# Patient Record
Sex: Female | Born: 1980
Health system: Southern US, Community
[De-identification: ages and names within clinical notes are randomized; demographics above are authoritative.]

## PROBLEM LIST (undated history)

## (undated) DIAGNOSIS — I10 Essential (primary) hypertension: Secondary | ICD-10-CM

## (undated) DIAGNOSIS — E109 Type 1 diabetes mellitus without complications: Secondary | ICD-10-CM

## (undated) DIAGNOSIS — M5430 Sciatica, unspecified side: Secondary | ICD-10-CM

## (undated) DIAGNOSIS — Z87891 Personal history of nicotine dependence: Secondary | ICD-10-CM

## (undated) DIAGNOSIS — N19 Unspecified kidney failure: Secondary | ICD-10-CM

## (undated) DIAGNOSIS — M869 Osteomyelitis, unspecified: Secondary | ICD-10-CM

## (undated) DIAGNOSIS — F419 Anxiety disorder, unspecified: Secondary | ICD-10-CM

## (undated) DIAGNOSIS — K3184 Gastroparesis: Secondary | ICD-10-CM

## (undated) DIAGNOSIS — I219 Acute myocardial infarction, unspecified: Secondary | ICD-10-CM

## (undated) DIAGNOSIS — I70449 Atherosclerosis of autologous vein bypass graft(s) of the left leg with ulceration of unspecified site: Secondary | ICD-10-CM

## (undated) DIAGNOSIS — N181 Chronic kidney disease, stage 1: Secondary | ICD-10-CM

## (undated) DIAGNOSIS — E7800 Pure hypercholesterolemia, unspecified: Secondary | ICD-10-CM

## (undated) DIAGNOSIS — R0600 Dyspnea, unspecified: Secondary | ICD-10-CM

## (undated) DIAGNOSIS — I2510 Atherosclerotic heart disease of native coronary artery without angina pectoris: Secondary | ICD-10-CM

## (undated) DIAGNOSIS — M1990 Unspecified osteoarthritis, unspecified site: Secondary | ICD-10-CM

## (undated) DIAGNOSIS — I5022 Chronic systolic (congestive) heart failure: Secondary | ICD-10-CM

## (undated) DIAGNOSIS — K219 Gastro-esophageal reflux disease without esophagitis: Secondary | ICD-10-CM

## (undated) HISTORY — DX: Atherosclerotic heart disease of native coronary artery without angina pectoris: I25.10

## (undated) HISTORY — DX: Chronic systolic (congestive) heart failure: I50.22

## (undated) HISTORY — DX: Unspecified osteoarthritis, unspecified site: M19.90

## (undated) HISTORY — PX: TUBAL LIGATION: SHX77

## (undated) HISTORY — PX: APPENDECTOMY: SHX54

## (undated) HISTORY — DX: Anxiety disorder, unspecified: F41.9

## (undated) HISTORY — DX: Unspecified kidney failure: N19

## (undated) NOTE — *Deleted (*Deleted)
[facility] - Clinic Note  10/14/2020     CHIEF COMPLAINT Patient presents for No chief complaint on file.   HISTORY OF PRESENT ILLNESS: Anne [name] is a 32 y.o. female who presents to the clinic today for:   pt states she was hospitalized in April for gastroparesis, she states she lost a bunch of weight at that time, she states she has had all her medications put into blister packs so that she makes sure she takes all of them, she says she was inadvertently missing some doses bc of her eye sight, she states she is feeling much better physically and has gained about 15lbs  Referring physician: Tanna Furry, MD [address] [place],  [place] [address]  HISTORICAL INFORMATION:   Selected notes from the MEDICAL RECORD NUMBER Referred by Dr. Georgiann Cocker for concern of VH / TRD OS   CURRENT MEDICATIONS: Current Outpatient Medications (Ophthalmic Drugs)  Medication Sig  . brimonidine (ALPHAGAN) 0.2 % ophthalmic solution Place 1 drop into the right eye 2 (two) times daily.  . dorzolamide-timolol (COSOPT) 22.3-6.8 MG/ML ophthalmic solution Place 1 drop into the right eye 2 (two) times daily.   No current facility-administered medications for this visit. (Ophthalmic Drugs)   Current Outpatient Medications (Other)  Medication Sig  . albuterol (VENTOLIN HFA) 108 (90 Base) MCG/ACT inhaler Inhale 2 puffs into the lungs every 6 (six) hours as needed for wheezing or shortness of breath.  Marland Kitchen amoxicillin (AMOXIL) 500 MG capsule Take 500 mg by mouth 3 (three) times daily.  Marland Kitchen aspirin (GNP ASPIRIN LOW DOSE) 81 MG EC tablet Take 1 tablet (81 mg total) by mouth daily with breakfast. Swallow whole.  Marland Kitchen atorvastatin (LIPITOR) 80 MG tablet Take 1 tablet (80 mg total) by mouth every evening.  Marland Kitchen BAQSIMI TWO PACK 3 MG/DOSE POWD Place 3 mg into the nose once as needed (for emergency low blood sugar levels).   Marland Kitchen BYDUREON BCISE 2 MG/0.85ML AUIJ Inject 2 mg into the skin  every Thursday.  . carvedilol (COREG) 6.25 MG tablet Take 1.5 tablets (9.375 mg total) by mouth 2 (two) times daily.  Marland Kitchen ezetimibe (ZETIA) 10 MG tablet Take 1 tablet (10 mg total) by mouth daily.  . fenofibrate (TRICOR) 145 MG tablet Take 1 tablet (145 mg total) by mouth daily.  . furosemide (LASIX) 40 MG tablet Take 1 tablet (40 mg total) by mouth 2 (two) times daily.  . isosorbide mononitrate (IMDUR) 30 MG 24 hr tablet TAKE 1 TABLET BY MOUTH ONCE DAILY.  Marland Kitchen LANTUS SOLOSTAR 100 UNIT/ML Solostar Pen INNJECT 50 UNITS S.Q. ONCE DAILY AT 10 P.M. (Patient taking differently: Inject 50 Units into the skin at bedtime. )  . losartan (COZAAR) 25 MG tablet Take 1 tablet (25 mg total) by mouth every evening.  . metFORMIN (GLUCOPHAGE) 1000 MG tablet Take 1,000 mg by mouth 2 (two) times daily with a meal.  . metoCLOPramide (REGLAN) 5 MG tablet Take 1 tablet (5 mg total) by mouth 3 (three) times daily before meals.  . nitroGLYCERIN (NITROSTAT) 0.4 MG SL tablet Place 1 tablet (0.4 mg total) under the tongue every 5 (five) minutes as needed.  Marland Kitchen NOVOLOG FLEXPEN 100 UNIT/ML FlexPen INJECT 12-18 UNITS S.Q. THREE TIMES DAILY WITH MEALS. (Patient taking differently: Inject 12-18 Units into the skin 3 (three) times daily with meals. )  . ondansetron (ZOFRAN ODT) 8 MG disintegrating tablet Take 0.5 tablets (4 mg total) by mouth in the morning, at  noon, and at bedtime.  . pantoprazole (PROTONIX) 40 MG tablet Take 1 tablet (40 mg total) by mouth daily.  . potassium chloride (K-DUR) 10 MEQ tablet Take 1 tablet (10 mEq total) by mouth 2 (two) times daily.  . pregabalin (LYRICA) 75 MG capsule   . promethazine (PHENERGAN) 12.5 MG suppository Place 1 suppository (12.5 mg total) rectally as needed for refractory nausea / vomiting.  . sertraline (ZOLOFT) 100 MG tablet Take 200 mg by mouth at bedtime.   Marland Kitchen spironolactone (ALDACTONE) 25 MG tablet Take 0.5 tablets (12.5 mg total) by mouth daily.  Marland Kitchen sulfamethoxazole-trimethoprim  (BACTRIM DS) 800-160 MG tablet Take 1 tablet by mouth 2 (two) times daily.  . ticagrelor ([name]) 90 MG TABS tablet Take 1 tablet (90 mg total) by mouth 2 (two) times daily.  . vitamin B-12 (CYANOCOBALAMIN) 1000 MCG tablet Take 1,000 mcg by mouth daily.  . Vitamin D, Ergocalciferol, (DRISDOL) 1.25 MG (50000 UNIT) CAPS capsule Take by mouth.   No current facility-administered medications for this visit. (Other)      REVIEW OF SYSTEMS:    ALLERGIES Allergies  Allergen Reactions  . Canagliflozin Other (See Comments)    Vaginal yeast infections  . Nsaids Other (See Comments)    Yeast infection     PAST MEDICAL HISTORY Past Medical History:  Diagnosis Date  . Acid reflux   . Anxiety   . Arthritis   . 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 CAD (coronary artery disease) 11/13/2018   Late presentation anterior MI 12/19 >> LHC - dLM 25, mLAD 99, oOM2 100 (R-L collats), irreg RCA, EF 25-35 >> PCI: POBA to mLAD  . Chronic systolic CHF (congestive heart failure) (HCC) 11/28/2018   Ischemic CM // late presentation ant MI 10/2018 tx with POBA to LAD (residual CAD with CTO of the OM2) // Echo 12/19:  No mural apical thrombus, septal, apical mid ant and inf HK; mild LVH, EF 30-35, mild MR // Echo 01/2019: EF 30-35, Gr 1 DD, diff HK, apical AK, mild MR   . CKD (chronic kidney disease), stage I   . Diabetes mellitus type 1 (HCC)   . Dyspnea   . Former tobacco use   . Gastroparesis   . Hypercholesteremia   . Hypertension   . Myocardial infarction (HCC) 2019  . 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  . Renal failure   . Sciatica    Past Surgical History:  Procedure Laterality Date  . AMPUTATION Right 11/03/2011   Procedure: AMPUTATION RAY;  Surgeon: Dalia Heading;  Location: [facility];  Service: General;  Laterality: Right;  Right fourth and fifth metatarsal   . APPENDECTOMY    . CARDIAC CATHETERIZATION  10/2018  . CESAREAN SECTION  2004 and  2007   x2  . CORONARY/GRAFT ACUTE MI REVASCULARIZATION N/A 11/11/2018   Procedure: CORONARY/GRAFT ACUTE MI REVASCULARIZATION;  Surgeon: Corky Crafts, MD;  Location: [facility] INVASIVE CV LAB;  Service: Cardiovascular;  Laterality: N/A;  . FRACTURE SURGERY  2000  . INJECTION OF SILICONE OIL Right 09/14/2019   Procedure: Injection Of Silicone Oil;  Surgeon: Rennis Chris, MD;  Location: [facility] OR;  Service: Ophthalmology;  Laterality: Right;  . LEFT HEART CATH AND CORONARY ANGIOGRAPHY N/A 11/11/2018   Procedure: LEFT HEART CATH AND CORONARY ANGIOGRAPHY;  Surgeon: Corky Crafts, MD;  Location: [facility] INVASIVE CV LAB;  Service: Cardiovascular;  Laterality: N/A;  . MEMBRANE PEEL  Right 09/14/2019   Procedure: Eula Flax;  Surgeon: Rennis Chris, MD;  Location: [facility] OR;  Service: Ophthalmology;  Laterality: Right;  . PARS PLANA VITRECTOMY Right 09/14/2019   Procedure: [place] Vitrectomy With 25 Gauge;  Surgeon: Rennis Chris, MD;  Location: [facility] OR;  Service: Ophthalmology;  Laterality: Right;  . PHOTOCOAGULATION WITH LASER Right 09/14/2019   Procedure: Photocoagulation With Laser;  Surgeon: Rennis Chris, MD;  Location: [facility] OR;  Service: Ophthalmology;  Laterality: Right;  . 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   Procedure: REPAIR OF COMPLEX TRACTION RETINAL DETACHMENT;  Surgeon: Rennis Chris, MD;  Location: [facility] OR;  Service: Ophthalmology;  Laterality: Right;  . TUBAL LIGATION      FAMILY HISTORY Family History  Problem Relation Age of Onset  . Diabetes Father   . Lung cancer Father   . Alcoholism Father   . Asthma Mother   . Kidney disease Mother   . Anemia Mother        hemolytic  . Hypertension Mother   . Heart attack Paternal Grandmother   . Diabetes Paternal Grandmother   . Diabetes Paternal Grandfather   . Anesthesia problems Neg Hx   . Hypotension Neg Hx   . Malignant hyperthermia Neg Hx   . Pseudochol deficiency Neg Hx     SOCIAL HISTORY Social  History   Tobacco Use  . Smoking status: Former Smoker    Packs/day: 0.25    Years: 20.00    Pack years: 5.00    Types: Cigarettes    Quit date: 12/29/2013    Years since quitting: 6.7  . Smokeless tobacco: Never Used  Vaping Use  . Vaping Use: Never used  Substance Use Topics  . Alcohol use: No  . Drug use: No         OPHTHALMIC EXAM:  Not recorded     IMAGING AND PROCEDURES  Imaging and Procedures for @TODAY @           ASSESSMENT/PLAN:    ICD-10-CM   1. 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 Retinal edema  H35.81   3. Essential hypertension  I10   4. Hypertensive retinopathy of both eyes  H35.033   5. Combined forms of age-related cataract of both eyes  H25.813     1,2. 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)  - lost to f/u from 3.19.21 to 8.20.21 (5 mos) -- was been hospitalized due to gastroparesis  - came back due to running out of drops  - delayed follow up from 4 weeks to 11 weeks (from 12.11.20 to 3.2.21)  - formerly managed at [facility] -- s/p PRP OU -- last visit in 2017  - extensive history of severe disease and medical noncompliance  - exam showed and OCT confirmed TRD OU -- OD with inf macula and foveal involvement; OS with closed wolf-jaw total TRD with macular hole  - discussed findings and very poor prognosis given chronicity of problems  - S/P IVA #1 OD (10.16.20)  - s/p PPV/MP/EL/FAX/1000cs SO OD, 10.22.20  - BCVA 20/80 OD -- improved from 20/150             - fibrosis improved and retina flattening under oil  - OCT shows interval improvement in shallow SRF inferiorly -- improving slowly             - IOP  okay today at 20, was elevated at 56 on 03.02.21 -- ran out of drops about 1 wk ago   - restart Cosopt BID OD -- refills sent  - restart Brimonidine BID OD -- refills sent  -  f/u 3-4 months, sooner prn -- DFE, OCT   3,4. Hypertensive retinopathy OU  - discussed importance of tight BP control  - monitor  5. 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  - discussed diagnosis and progression  - OD w/ progressive PSC -- likely limiting vision  - will refer to [facility] for cat eval   Ophthalmic Meds Ordered this visit:  No orders of the defined types were placed in this encounter.      No follow-ups on file.  There are no Patient Instructions on file for this visit.  This document serves as a record of services personally performed by Karie Chimera, MD, PhD. It was created on their behalf by Herby Abraham, COA, an ophthalmic technician. The creation of this record is the provider's dictation and/or activities during the visit.    Electronically signed by: Herby Abraham, COA @TODAY @ 10:22 AM  Abbreviations: M myopia (nearsighted); A astigmatism; H hyperopia (farsighted); P presbyopia; Mrx spectacle prescription;  CTL contact lenses; OD right eye; OS left eye; OU both eyes  XT exotropia; ET esotropia; PEK punctate epithelial keratitis; PEE punctate epithelial erosions; DES dry eye syndrome; MGD meibomian gland dysfunction; ATs artificial tears; PFAT's preservative free artificial tears; NSC nuclear sclerotic cataract; PSC posterior subcapsular cataract; ERM epi-retinal membrane; PVD posterior vitreous detachment; RD retinal detachment; DM diabetes mellitus; DR diabetic retinopathy; NPDR non-proliferative diabetic retinopathy; PDR proliferative diabetic retinopathy; CSME clinically significant macular edema; DME diabetic macular edema; dbh dot blot hemorrhages; CWS cotton wool spot; POAG primary open angle glaucoma; C/D cup-to-disc ratio; HVF humphrey visual field; GVF goldmann visual field; OCT optical coherence tomography; IOP intraocular pressure; BRVO Branch  retinal vein occlusion; CRVO central retinal vein occlusion; CRAO central retinal artery occlusion; BRAO branch retinal artery occlusion; RT retinal tear; SB scleral buckle; PPV pars plana vitrectomy; VH Vitreous hemorrhage; PRP panretinal laser photocoagulation; IVK intravitreal kenalog; VMT vitreomacular traction; MH Macular hole;  NVD neovascularization of the disc; NVE neovascularization elsewhere; AREDS age related eye disease study; ARMD age related macular degeneration; POAG primary open angle glaucoma; EBMD epithelial/anterior basement membrane dystrophy; ACIOL anterior chamber intraocular lens; IOL intraocular lens; PCIOL posterior chamber intraocular lens; Phaco/IOL phacoemulsification with intraocular lens placement; PRK photorefractive keratectomy; LASIK laser assisted in situ keratomileusis; HTN hypertension; DM diabetes mellitus; COPD chronic obstructive pulmonary disease

---
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
## 2005-05-01 ENCOUNTER — Ambulatory Visit (HOSPITAL_COMMUNITY): Admission: RE | Admit: 2005-05-01 | Discharge: 2005-05-01 | Payer: Self-pay | Admitting: Family Medicine

## 2005-06-03 ENCOUNTER — Encounter: Payer: Self-pay | Admitting: Family Medicine

## 2005-06-10 ENCOUNTER — Encounter (HOSPITAL_COMMUNITY): Admission: RE | Admit: 2005-06-10 | Discharge: 2005-07-10 | Payer: Self-pay | Admitting: Family Medicine

## 2005-06-23 ENCOUNTER — Encounter: Payer: Self-pay | Admitting: Family Medicine

## 2005-06-25 ENCOUNTER — Emergency Department (HOSPITAL_COMMUNITY): Admission: EM | Admit: 2005-06-25 | Discharge: 2005-06-25 | Payer: Self-pay | Admitting: Emergency Medicine

## 2005-07-16 ENCOUNTER — Emergency Department (HOSPITAL_COMMUNITY): Admission: EM | Admit: 2005-07-16 | Discharge: 2005-07-16 | Payer: Self-pay | Admitting: Emergency Medicine

## 2005-07-20 ENCOUNTER — Encounter (HOSPITAL_COMMUNITY): Admission: RE | Admit: 2005-07-20 | Discharge: 2005-08-19 | Payer: Self-pay | Admitting: Family Medicine

## 2009-08-26 ENCOUNTER — Emergency Department (HOSPITAL_COMMUNITY): Admission: EM | Admit: 2009-08-26 | Discharge: 2009-08-26 | Payer: Self-pay | Admitting: Emergency Medicine

## 2010-12-14 ENCOUNTER — Encounter: Payer: Self-pay | Admitting: Family Medicine

## 2011-02-26 LAB — URINE MICROSCOPIC-ADD ON

## 2011-02-26 LAB — URINALYSIS, ROUTINE W REFLEX MICROSCOPIC
Bilirubin Urine: NEGATIVE
pH: 6 (ref 5.0–8.0)

## 2011-02-26 LAB — CBC
Hemoglobin: 13.4 g/dL (ref 12.0–15.0)
MCHC: 35.3 g/dL (ref 30.0–36.0)
RBC: 4.25 MIL/uL (ref 3.87–5.11)
WBC: 9.3 10*3/uL (ref 4.0–10.5)

## 2011-02-26 LAB — DIFFERENTIAL
Basophils Relative: 0 % (ref 0–1)
Lymphs Abs: 1.5 10*3/uL (ref 0.7–4.0)
Monocytes Absolute: 0.6 10*3/uL (ref 0.1–1.0)
Monocytes Relative: 6 % (ref 3–12)
Neutro Abs: 7.1 10*3/uL (ref 1.7–7.7)

## 2011-02-26 LAB — BASIC METABOLIC PANEL
CO2: 28 mEq/L (ref 19–32)
Calcium: 9.2 mg/dL (ref 8.4–10.5)
Chloride: 98 mEq/L (ref 96–112)
GFR calc Af Amer: >60 mL/min (ref >60)
Sodium: 133 mEq/L — ABNORMAL LOW (ref 135–145)

## 2011-02-26 LAB — PREGNANCY, URINE: Preg Test, Ur: NEGATIVE

## 2011-10-27 ENCOUNTER — Emergency Department (HOSPITAL_COMMUNITY)
Admission: EM | Admit: 2011-10-27 | Discharge: 2011-10-28 | Disposition: A | Discharge status: Home / Self Care | Payer: Medicaid Other | Attending: Emergency Medicine | Admitting: Emergency Medicine

## 2011-10-27 ENCOUNTER — Encounter: Payer: Self-pay | Admitting: *Deleted

## 2011-10-27 ENCOUNTER — Emergency Department (HOSPITAL_COMMUNITY): Payer: Medicaid Other

## 2011-10-27 DIAGNOSIS — I10 Essential (primary) hypertension: Secondary | ICD-10-CM | POA: Insufficient documentation

## 2011-10-27 DIAGNOSIS — L03119 Cellulitis of unspecified part of limb: Secondary | ICD-10-CM | POA: Insufficient documentation

## 2011-10-27 DIAGNOSIS — F17200 Nicotine dependence, unspecified, uncomplicated: Secondary | ICD-10-CM | POA: Insufficient documentation

## 2011-10-27 DIAGNOSIS — K219 Gastro-esophageal reflux disease without esophagitis: Secondary | ICD-10-CM | POA: Insufficient documentation

## 2011-10-27 DIAGNOSIS — L97509 Non-pressure chronic ulcer of other part of unspecified foot with unspecified severity: Secondary | ICD-10-CM | POA: Insufficient documentation

## 2011-10-27 DIAGNOSIS — E1169 Type 2 diabetes mellitus with other specified complication: Secondary | ICD-10-CM | POA: Insufficient documentation

## 2011-10-27 DIAGNOSIS — Z794 Long term (current) use of insulin: Secondary | ICD-10-CM | POA: Insufficient documentation

## 2011-10-27 DIAGNOSIS — L0390 Cellulitis, unspecified: Secondary | ICD-10-CM

## 2011-10-27 DIAGNOSIS — L02619 Cutaneous abscess of unspecified foot: Secondary | ICD-10-CM | POA: Insufficient documentation

## 2011-10-27 DIAGNOSIS — E11621 Type 2 diabetes mellitus with foot ulcer: Secondary | ICD-10-CM

## 2011-10-27 HISTORY — DX: Essential (primary) hypertension: I10

## 2011-10-27 HISTORY — DX: Gastro-esophageal reflux disease without esophagitis: K21.9

## 2011-10-27 LAB — DIFFERENTIAL
Basophils Relative: 0 % (ref 0–1)
Eosinophils Absolute: 0.1 10*3/uL (ref 0.0–0.7)
Eosinophils Relative: 1 % (ref 0–5)
Lymphs Abs: 2.6 10*3/uL (ref 0.7–4.0)
Monocytes Absolute: 0.8 10*3/uL (ref 0.1–1.0)
Monocytes Relative: 5 % (ref 3–12)
Neutrophils Relative %: 77 % (ref 43–77)

## 2011-10-27 LAB — CBC
HCT: 36.1 % (ref 36.0–46.0)
Hemoglobin: 12.8 g/dL (ref 12.0–15.0)
MCH: 31 pg (ref 26.0–34.0)
MCHC: 35.5 g/dL (ref 30.0–36.0)
MCV: 87.4 fL (ref 78.0–100.0)

## 2011-10-27 LAB — BASIC METABOLIC PANEL
BUN: 12 mg/dL (ref 6–23)
Calcium: 10 mg/dL (ref 8.4–10.5)
Creatinine, Ser: 0.59 mg/dL (ref 0.50–1.10)
GFR calc Af Amer: >90 mL/min (ref >90)
GFR calc non Af Amer: >90 mL/min (ref >90)
Glucose, Bld: 142 mg/dL — ABNORMAL HIGH (ref 70–99)

## 2011-10-27 MED ORDER — MORPHINE SULFATE 4 MG/ML IJ SOLN
4.0000 mg | Freq: Once | INTRAMUSCULAR | Status: AC
  Administered 2011-10-27: 4 mg via INTRAVENOUS
  Filled 2011-10-27: qty 1

## 2011-10-27 MED ORDER — SODIUM CHLORIDE 0.9 % IV SOLN
3.0000 g | Freq: Once | INTRAVENOUS | Status: AC
  Administered 2011-10-27: 3 g via INTRAVENOUS
  Filled 2011-10-27: qty 3

## 2011-10-27 NOTE — ED Provider Notes (Signed)
History     CSN: [id number] Arrival date & time: 10/27/2011  9:09 PM   First MD Initiated Contact with Patient 10/27/11 2300      Chief Complaint  Patient presents with  . Foot Pain    (Consider location/radiation/quality/duration/timing/severity/associated sxs/prior treatment) HPI Comments: Patient with history of DM.  Has had foot cellulitis, being treated with clinda.  Has gotten worse.  Was sent here for iv antibiotics.    Patient is a 30 y.o. female presenting with lower extremity pain. The history is provided by the patient.  Foot Pain The current episode started more than 1 week ago. The problem occurs constantly. The problem has been gradually worsening. The symptoms are aggravated by walking. The symptoms are relieved by nothing.    Past Medical History  Diagnosis Date  . Diabetes mellitus   . Hypertension   . Acid reflux     Past Surgical History  Procedure Date  . Appendectomy   . Cesarean section     No family history on file.  History  Substance Use Topics  . Smoking status: Current Some Day Smoker  . Smokeless tobacco: Not on file  . Alcohol Use: No    OB History    Grav Para Term Preterm Abortions TAB SAB Ect Mult Living                  Review of Systems  All other systems reviewed and are negative.    Allergies  Review of patient's allergies indicates no known allergies.  Home Medications   Current Outpatient Rx  Name Route Sig Dispense Refill  . ACETAMINOPHEN 500 MG PO TABS Oral Take 1,000 mg by mouth daily as needed. For pain     . CLINDAMYCIN HCL 300 MG PO CAPS Oral Take 600 mg by mouth 3 (three) times daily.      . INSULIN ASPART 100 UNIT/ML [place] SOLN Subcutaneous Inject 6-8 Units into the skin 2 (two) times daily. Take 6 units with lunch and 8 units with dinner     . INSULIN GLARGINE 100 UNIT/ML [place] SOLN Subcutaneous Inject 31 Units into the skin at bedtime.      Marland Kitchen LISINOPRIL 5 MG PO TABS Oral Take 5 mg by mouth daily.      Marland Kitchen  METFORMIN HCL 1000 MG PO TABS Oral Take 1,000 mg by mouth 2 (two) times daily.      Marland Kitchen NAPROXEN 500 MG PO TABS Oral Take 500 mg by mouth 2 (two) times daily.      Marland Kitchen PRAVASTATIN SODIUM 20 MG PO TABS Oral Take 20 mg by mouth daily.        BP 117/70  Pulse 107  Temp(Src) 98 F (36.7 C) (Oral)  Resp 16  Ht 5\' 10"  (1.778 m)  Wt 198 lb (89.812 kg)  BMI 28.41 kg/m2  SpO2 100%  LMP 10/03/2011  Physical Exam  Constitutional: She is oriented to person, place, and time. She appears well-developed and well-nourished. No distress.  HENT:  Head: Normocephalic and atraumatic.  Neck: Normal range of motion. Neck supple.  Cardiovascular: Normal rate and regular rhythm.  Exam reveals friction rub. Exam reveals no gallop.   No murmur heard. Pulmonary/Chest: Effort normal and breath sounds normal. No respiratory distress. She has no wheezes.  Abdominal: Soft. Bowel sounds are normal. She exhibits no distension. There is no tenderness.  Musculoskeletal:       The right foot has an ulcer on the lateral bottom aspect in the  area of the distal 5th metatarsal.  There is warmth and erythema surrounding.    Neurological: She is alert and oriented to person, place, and time.  Skin: Skin is warm and dry. She is not diaphoretic.    ED Course  Procedures (including critical care time)  Labs Reviewed  CBC - Abnormal; Notable for the following:    WBC 14.8 (*)    All other components within normal limits  DIFFERENTIAL - Abnormal; Notable for the following:    Neutro Abs 11.3 (*)    All other components within normal limits  BASIC METABOLIC PANEL - Abnormal; Notable for the following:    Glucose, Bld 142 (*)    All other components within normal limits   Dg Foot Complete Right  10/27/2011  *RADIOLOGY REPORT*  Clinical Data: Diabetic with pain, swelling and erythema involving the fourth and fifth metatarsals.  No known injury.  RIGHT FOOT COMPLETE - 3+ VIEW  Comparison: None.  Findings: There is a  nondisplaced fracture involving the medial base of the second proximal phalanx.  No displaced fracture, dislocation or bone destruction is identified.  Diffuse forefoot soft tissue swelling is present.  There is no evidence of soft tissue emphysema.  IMPRESSION:  1.  Nondisplaced intra-articular fracture involving the base of the right second proximal phalanx. 2.  Forefoot soft tissue swelling.  No radiographic evidence of osteomyelitis.  Original Report Authenticated By: Gerrianne Scale, M.D.     No diagnosis found.    MDM  Will treat with augmentin, pain meds.  Patient was offered admission but does not want this at this point.  Will return if worsens.        Geoffery Lyons, MD 10/28/11 [id number]

## 2011-10-27 NOTE — ED Notes (Signed)
Pt reports she has been on abt meds for a diabetic ulcer on rt foot, was seen by PCP today and advised to be eval in ED for possible iv abt

## 2011-10-27 NOTE — ED Notes (Addendum)
Pt reports being on p.o abx for >2 weeks for infected area on right foot.  States that she saw her PCP today and was instructed to come to ED in order to be evaluated and admitted for IV antibiotics.  Right side of foot red and swollen.  Blanched area on bottom of foot, surrounding small laceration. No discharge noted from wound. Pt requesting pain medication.

## 2011-10-28 MED ORDER — AMOXICILLIN-POT CLAVULANATE 500-125 MG PO TABS: 1.0000 | ORAL_TABLET | Freq: Three times a day (TID) | ORAL | Status: DC

## 2011-10-28 MED ORDER — OXYCODONE-ACETAMINOPHEN 5-325 MG PO TABS: 2.0000 | ORAL_TABLET | ORAL | Status: DC | PRN

## 2011-10-28 NOTE — ED Notes (Signed)
Wrapped right foot with gauze wrap as requested by patient.

## 2011-10-30 ENCOUNTER — Encounter (HOSPITAL_COMMUNITY): Payer: Self-pay | Admitting: *Deleted

## 2011-10-30 ENCOUNTER — Emergency Department (HOSPITAL_COMMUNITY): Payer: Medicaid Other

## 2011-10-30 ENCOUNTER — Inpatient Hospital Stay (HOSPITAL_COMMUNITY)
Admission: EM | Admit: 2011-10-30 | Discharge: 2011-11-05 | DRG: 240 | Disposition: A | Discharge status: Home Health | Payer: Medicaid Other | Attending: Internal Medicine | Admitting: Internal Medicine

## 2011-10-30 DIAGNOSIS — L03119 Cellulitis of unspecified part of limb: Secondary | ICD-10-CM | POA: Diagnosis present

## 2011-10-30 DIAGNOSIS — K219 Gastro-esophageal reflux disease without esophagitis: Secondary | ICD-10-CM | POA: Diagnosis present

## 2011-10-30 DIAGNOSIS — I96 Gangrene, not elsewhere classified: Secondary | ICD-10-CM

## 2011-10-30 DIAGNOSIS — L03115 Cellulitis of right lower limb: Secondary | ICD-10-CM

## 2011-10-30 DIAGNOSIS — L02619 Cutaneous abscess of unspecified foot: Secondary | ICD-10-CM | POA: Diagnosis present

## 2011-10-30 DIAGNOSIS — Z794 Long term (current) use of insulin: Secondary | ICD-10-CM

## 2011-10-30 DIAGNOSIS — E11628 Type 2 diabetes mellitus with other skin complications: Secondary | ICD-10-CM

## 2011-10-30 DIAGNOSIS — E1059 Type 1 diabetes mellitus with other circulatory complications: Principal | ICD-10-CM | POA: Diagnosis present

## 2011-10-30 DIAGNOSIS — I10 Essential (primary) hypertension: Secondary | ICD-10-CM | POA: Diagnosis present

## 2011-10-30 HISTORY — DX: Type 1 diabetes mellitus without complications: E10.9

## 2011-10-30 LAB — BASIC METABOLIC PANEL
Calcium: 10.1 mg/dL (ref 8.4–10.5)
GFR calc Af Amer: >90 mL/min (ref >90)
GFR calc non Af Amer: >90 mL/min (ref >90)
Glucose, Bld: 157 mg/dL — ABNORMAL HIGH (ref 70–99)
Potassium: 3.9 mEq/L (ref 3.5–5.1)
Sodium: 134 mEq/L — ABNORMAL LOW (ref 135–145)

## 2011-10-30 LAB — CBC
MCH: 30.4 pg (ref 26.0–34.0)
MCHC: 35.1 g/dL (ref 30.0–36.0)
RDW: 12 % (ref 11.5–15.5)

## 2011-10-30 LAB — GLUCOSE, CAPILLARY: Glucose-Capillary: 199 mg/dL — ABNORMAL HIGH (ref 70–99)

## 2011-10-30 MED ORDER — SODIUM CHLORIDE 0.9 % IV SOLN
INTRAVENOUS | Status: AC
  Administered 2011-10-30: 22:00:00 via INTRAVENOUS

## 2011-10-30 MED ORDER — HYDROCODONE-ACETAMINOPHEN 5-325 MG PO TABS: 1.0000 | ORAL_TABLET | Freq: Once | ORAL | Status: DC

## 2011-10-30 MED ORDER — SIMVASTATIN 10 MG PO TABS
10.0000 mg | ORAL_TABLET | Freq: Every day | ORAL | Status: DC
  Administered 2011-10-31 – 2011-11-04 (×5): 10 mg via ORAL
  Filled 2011-10-30 (×5): qty 1

## 2011-10-30 MED ORDER — MORPHINE SULFATE 4 MG/ML IJ SOLN
4.0000 mg | Freq: Once | INTRAMUSCULAR | Status: AC
  Administered 2011-10-30: 4 mg via INTRAVENOUS
  Filled 2011-10-30: qty 1

## 2011-10-30 MED ORDER — MORPHINE SULFATE 2 MG/ML IJ SOLN
1.0000 mg | INTRAMUSCULAR | Status: DC | PRN
  Administered 2011-10-31 (×3): 1 mg via INTRAVENOUS
  Filled 2011-10-30 (×3): qty 1

## 2011-10-30 MED ORDER — OXYCODONE-ACETAMINOPHEN 5-325 MG PO TABS
2.0000 | ORAL_TABLET | ORAL | Status: DC | PRN
  Administered 2011-10-30 – 2011-11-03 (×6): 2 via ORAL
  Filled 2011-10-30 (×6): qty 2

## 2011-10-30 MED ORDER — HYDROMORPHONE HCL PF 1 MG/ML IJ SOLN
1.0000 mg | INTRAMUSCULAR | Status: DC | PRN
  Administered 2011-10-30: 1 mg via INTRAVENOUS
  Filled 2011-10-30: qty 1

## 2011-10-30 MED ORDER — VANCOMYCIN HCL IN DEXTROSE 1-5 GM/200ML-% IV SOLN
INTRAVENOUS | Status: AC
  Filled 2011-10-30: qty 200

## 2011-10-30 MED ORDER — SODIUM CHLORIDE 0.9 % IV SOLN
INTRAVENOUS | Status: AC
  Administered 2011-10-30: 500 mL via INTRAVENOUS

## 2011-10-30 MED ORDER — VANCOMYCIN HCL IN DEXTROSE 1-5 GM/200ML-% IV SOLN
1000.0000 mg | Freq: Two times a day (BID) | INTRAVENOUS | Status: DC
Start: 2011-10-31 — End: 2011-11-01
  Administered 2011-10-31 – 2011-11-01 (×3): 1000 mg via INTRAVENOUS
  Filled 2011-10-30 (×5): qty 200

## 2011-10-30 MED ORDER — ONDANSETRON HCL 4 MG/2ML IJ SOLN: 4.0000 mg | Freq: Four times a day (QID) | INTRAMUSCULAR | Status: DC | PRN

## 2011-10-30 MED ORDER — ONDANSETRON HCL 4 MG PO TABS: 4.0000 mg | ORAL_TABLET | Freq: Four times a day (QID) | ORAL | Status: DC | PRN

## 2011-10-30 MED ORDER — VANCOMYCIN HCL IN DEXTROSE 1-5 GM/200ML-% IV SOLN
1000.0000 mg | Freq: Once | INTRAVENOUS | Status: AC
  Administered 2011-10-30: 1000 mg via INTRAVENOUS
  Filled 2011-10-30: qty 200

## 2011-10-30 MED ORDER — INFLUENZA VIRUS VACC SPLIT PF IM SUSP
0.5000 mL | INTRAMUSCULAR | Status: DC
  Filled 2011-10-30: qty 0.5

## 2011-10-30 MED ORDER — LISINOPRIL 5 MG PO TABS
5.0000 mg | ORAL_TABLET | Freq: Every day | ORAL | Status: DC
  Administered 2011-10-31 – 2011-11-05 (×5): 5 mg via ORAL
  Filled 2011-10-30 (×5): qty 1

## 2011-10-30 MED ORDER — INSULIN GLARGINE 100 UNIT/ML ~~LOC~~ SOLN
15.0000 [IU] | Freq: Every day | SUBCUTANEOUS | Status: DC
  Administered 2011-10-30: 15 [IU] via SUBCUTANEOUS
  Filled 2011-10-30: qty 3

## 2011-10-30 MED ORDER — INSULIN ASPART 100 UNIT/ML ~~LOC~~ SOLN
0.0000 [IU] | Freq: Three times a day (TID) | SUBCUTANEOUS | Status: DC
  Administered 2011-10-31: 3 [IU] via SUBCUTANEOUS
  Administered 2011-10-31: 2 [IU] via SUBCUTANEOUS
  Administered 2011-10-31: 1 [IU] via SUBCUTANEOUS
  Administered 2011-11-01 (×3): 2 [IU] via SUBCUTANEOUS
  Administered 2011-11-02: 5 [IU] via SUBCUTANEOUS
  Administered 2011-11-02: 3 [IU] via SUBCUTANEOUS
  Administered 2011-11-02: 2 [IU] via SUBCUTANEOUS
  Administered 2011-11-03: 3 [IU] via SUBCUTANEOUS
  Administered 2011-11-03: 5 [IU] via SUBCUTANEOUS
  Administered 2011-11-04 (×3): 2 [IU] via SUBCUTANEOUS
  Administered 2011-11-05: 3 [IU] via SUBCUTANEOUS
  Filled 2011-10-30: qty 3

## 2011-10-30 MED ORDER — PIPERACILLIN-TAZOBACTAM 3.375 G IVPB
INTRAVENOUS | Status: AC
  Filled 2011-10-30: qty 50

## 2011-10-30 MED ORDER — PIPERACILLIN-TAZOBACTAM 3.375 G IVPB
3.3750 g | Freq: Once | INTRAVENOUS | Status: AC
  Administered 2011-10-30: 3.375 g via INTRAVENOUS
  Filled 2011-10-30: qty 50

## 2011-10-30 MED ORDER — PIPERACILLIN-TAZOBACTAM 3.375 G IVPB
3.3750 g | Freq: Three times a day (TID) | INTRAVENOUS | Status: DC
  Administered 2011-10-30 – 2011-11-05 (×17): 3.375 g via INTRAVENOUS
  Filled 2011-10-30 (×21): qty 50

## 2011-10-30 NOTE — ED Provider Notes (Signed)
History     CSN: [id number] Arrival date & time: 10/30/2011  3:09 PM   First MD Initiated Contact with Patient 10/30/11 1514      Chief Complaint  Patient presents with  . Emesis  . Foot Ulcer   HPI Pt is a 30 year old female with known poorly controlled DM who has been receiving OP treatment for right foot cellulitis.  She was initially started on oral clinda by her OP physician.  As she did not initially respond, her dose was increased and, when she did not respond to this (over the course of ~2 weeks), she was sent to the ED for admission for IV Abx.  This was on 12/4.  She refused admission at the time and augmentin was started by the EDP.  At that time, there was no evidence of osteo or sub-q air on plain film.  Today she returns with worsening foot pain, new onset drainage from her foot wound, and fevers/chills and nausea/vomiting.  She has been taking her Abx as prescribed but says her symptoms are worsening significantly.  Pt's PCP is Dr. Margo Aye with [facility].  Past Medical History  Diagnosis Date  . Diabetes mellitus   . Hypertension   . Acid reflux     Past Surgical History  Procedure Date  . Appendectomy   . Cesarean section     History reviewed. No pertinent family history.  History  Substance Use Topics  . Smoking status: Current Some Day Smoker  . Smokeless tobacco: Not on file  . Alcohol Use: No    OB History    Grav Para Term Preterm Abortions TAB SAB Ect Mult Living                  Review of Systems  Constitutional: Positive for fever, chills and appetite change. Negative for diaphoresis and fatigue.  HENT: Negative.   Eyes: Negative.   Respiratory: Negative.   Cardiovascular: Negative.   Genitourinary: Negative.   Musculoskeletal: Positive for back pain.       Foot pain/swelling per HPI  Skin: Positive for wound.  Neurological: Negative.   Hematological: Negative.     Allergies  Review of patient's allergies indicates no  known allergies.  Home Medications   Current Outpatient Rx  Name Route Sig Dispense Refill  . ACETAMINOPHEN 500 MG PO TABS Oral Take 1,000 mg by mouth daily as needed. For pain     . AMOXICILLIN-POT CLAVULANATE 500-125 MG PO TABS Oral Take 1 tablet (500 mg total) by mouth every 8 (eight) hours. 30 tablet 0  . CLINDAMYCIN HCL 300 MG PO CAPS Oral Take 600 mg by mouth 3 (three) times daily.      . INSULIN ASPART 100 UNIT/ML [place] SOLN Subcutaneous Inject 6-8 Units into the skin 2 (two) times daily. Take 6 units with lunch and 8 units with dinner     . INSULIN GLARGINE 100 UNIT/ML [place] SOLN Subcutaneous Inject 31 Units into the skin at bedtime.      Marland Kitchen LISINOPRIL 5 MG PO TABS Oral Take 5 mg by mouth daily.      Marland Kitchen METFORMIN HCL 1000 MG PO TABS Oral Take 1,000 mg by mouth 2 (two) times daily.      Marland Kitchen NAPROXEN 500 MG PO TABS Oral Take 500 mg by mouth 2 (two) times daily.      . OXYCODONE-ACETAMINOPHEN 5-325 MG PO TABS Oral Take 2 tablets by mouth every 4 (four) hours as needed for  pain. 20 tablet 0  . PRAVASTATIN SODIUM 20 MG PO TABS Oral Take 20 mg by mouth daily.        BP 134/83  Pulse 119  Temp(Src) 100.4 F (38 C) (Oral)  Resp 20  SpO2 97%  LMP 10/03/2011  Physical Exam  Constitutional: She appears well-developed and well-nourished. No distress.  HENT:  Head: Normocephalic and atraumatic.  Eyes: Conjunctivae and EOM are normal.  Neck: Normal range of motion. Neck supple.  Cardiovascular: Normal rate, regular rhythm and normal heart sounds.   Pulmonary/Chest: Effort normal and breath sounds normal. No respiratory distress.  Abdominal: Soft. Bowel sounds are normal. She exhibits no distension.  Musculoskeletal: Normal range of motion. She exhibits edema.       Right foot pitting edema to just above the ankle.  Significant pain on palpation of the foot and calf on the right.  There is a wound with slight purulent drainage on the lateral aspect of the right foot.  There is significant erythema  encompasing most of the forefoot.  Pt also has new blackened tissue in the area between the 4th and 5th toes.  Skin: There is erythema.       Of forefoot on the right.  No streaking up the leg.    ED Course  Procedures  Labs Reviewed  CBC - Abnormal; Notable for the following:    WBC 16.8 (*)    Hemoglobin 11.9 (*)    HCT 33.9 (*)    All other components within normal limits  CULTURE, BLOOD (ROUTINE X 2)  CULTURE, BLOOD (ROUTINE X 2)  BASIC METABOLIC PANEL  SEDIMENTATION RATE  C-REACTIVE PROTEIN  HEMOGLOBIN A1C   Dg Foot Complete Right  10/30/2011  *RADIOLOGY REPORT*  Clinical Data: Worsening cellulitis right foot question subcutaneous gas, pain, swelling, redness  RIGHT FOOT COMPLETE - 3+ VIEW  Comparison: 10/27/2011  Findings: Osseous mineralization normal. Joint spaces preserved. Nondisplaced intra-articular fraction at medial aspect, base of proximal phalanx right second toe. No additional fracture or dislocation identified. Multiple foci of soft tissue gas are now identified between the bases of the fourth and fifth toes, extending into the proximal aspects of both toes, compatible with cellulitis/soft tissue infection by a gas forming organism. On the lateral view, unable to exclude cortical destruction at the plantar aspect of the base of the proximal phalanx of the fifth toe, though this could be an artifact.  IMPRESSION: Nondisplaced intra-articular fracture at base of proximal phalanx right second toe. Extensive soft tissue gas between the bases of the fourth and fifth toes compatible with extensive soft tissue infection by a gas forming organism. Unable to exclude bone destruction/osteomyelitis at the base of the proximal phalanx fifth toe. Note that radiographs are unable to exclude septic arthritis. If further imaging is required, consider MR imaging of the right foot with and without contrast.  Original Report Authenticated By: Lollie Marrow, M.D.     No diagnosis  found.  MDM  Pt has diabetic foot wound not improving on oral abx.  Have concern for osteo vs infection with gas-forming bacterium.  Will obtain BCx, start on vanc/zosyn and request admission for continued IV Abx.  Will also contact ortho to make them aware.        Majel Homer, MD 10/30/11 1701  Majel Homer, MD 10/30/11 1727  Dr. Romeo Apple (orthopedics) has been notified about patient.  He does not wish to officially consult at this time.  Necrotizing fascitis is felt less likely due to  prolonged presentation along with clinical exam.  If official consultation is required, he may be contacted again at any time.  Majel Homer, MD 10/30/11 1742  Majel Homer, MD 10/30/11 1754

## 2011-10-30 NOTE — ED Provider Notes (Signed)
4:02 PM  I performed a history and physical examination of Anne [name] and discussed her management with Dr. Louanne Belton.  I agree with the history, physical, assessment, and plan of care, with the following exceptions: None The patient has apparent diffuse erythema and swelling about the dorsum of the right foot, localized to the area between the 4th and 5th toes, where a black necrotic area is seen, worrisome for gangrene.  The patient is an uncontrolled diabetic who has at this time failed outpatient management with clindamycin and augmentin.  She will need admission for IV antibiotics. I was present for the following procedures: None Time Spent in Critical Care of the patient: None Time spent in discussions with the patient and family: .  Manus Rudd, MD 10/30/11 [id number]

## 2011-10-30 NOTE — H&P (Signed)
Chief Complaint:  Right fourth and fifth toe redness and swelling and draining pus and turning black  HPI: Anne [name] is a 30 year old female type I insulin-dependent diabetic who has been fighting an infection in her right lower extremity for over a month now. She she says that's around November 1 she gave herself a pedicure and used a pumice stone on her right foot and then noticed a wound there that looked like it was starting to get infected. She went to her doctor on November 1 was prescribed clindamycin 600 mg twice a day which she took and then went back to her doctor on November 20. The foot had gotten better at this point all of the erythema and swelling had basically resolved but there was still some mild infection so her physician increased her clindamycin to 600 mg 3 times a day until December 4. Around this time the foot got much worse and since December 2 the foot has progressively gotten more erythematous and her fifth right toe has started to turn black it has become more swollen and painful with purulent discharge. She came to the ED on December 4 was given some IV antibiotics and amoxicillin was admitted to the clindamycin and she was sent home. Despite these to oral antibiotics the foot again has progressively worsened and now she has blackish discoloration to the fourth and fifth toes of the right foot. We're asked to admit the patient for IV antibiotics for failed outpatient treatment of the diabetic foot. Her diabetes is not well controlled she says her normal is around 250 or above. She denies any nausea vomiting diarrhea or any other symptoms. Denies fevers.  Review of Systems:  Otherwise negative  Past Medical History: Past Medical History  Diagnosis Date  . Diabetes mellitus   . Hypertension   . Acid reflux    Past Surgical History  Procedure Date  . Appendectomy   . Cesarean section     Medications: Prior to Admission medications   Medication Sig Start Date End  Date Taking? Authorizing Provider  acetaminophen (TYLENOL) 500 MG tablet Take 1,000 mg by mouth daily as needed. For pain    Yes Historical Provider, MD  amoxicillin-clavulanate (AUGMENTIN) 500-125 MG per tablet Take 1 tablet (500 mg total) by mouth every 8 (eight) hours. 10/28/11 11/07/11 Yes Geoffery Lyons, MD  clindamycin (CLEOCIN) 300 MG capsule Take 600 mg by mouth 3 (three) times daily.     Yes Historical Provider, MD  insulin aspart (NOVOLOG FLEXPEN) 100 UNIT/ML injection Inject 6-8 Units into the skin 2 (two) times daily. Take 6 units with lunch and 8 units with dinner    Yes Historical Provider, MD  insulin glargine (LANTUS) 100 UNIT/ML injection Inject 31 Units into the skin at bedtime.     Yes Historical Provider, MD  lisinopril (PRINIVIL,ZESTRIL) 5 MG tablet Take 5 mg by mouth daily.     Yes Historical Provider, MD  metFORMIN (GLUCOPHAGE) 1000 MG tablet Take 1,000 mg by mouth 2 (two) times daily.     Yes Historical Provider, MD  naproxen (NAPROSYN) 500 MG tablet Take 500 mg by mouth 2 (two) times daily.     Yes Historical Provider, MD  oxyCODONE-acetaminophen (PERCOCET) 5-325 MG per tablet Take 2 tablets by mouth every 4 (four) hours as needed for pain. 10/28/11 11/07/11 Yes Geoffery Lyons, MD  pravastatin (PRAVACHOL) 20 MG tablet Take 20 mg by mouth daily.     Yes Historical Provider, MD    Allergies:  No  Known Allergies  Social History:  reports that she has been smoking.  She does not have any smokeless tobacco history on file. She reports that she does not drink alcohol. Her drug history not on file.  Family History: History reviewed. No pertinent family history.  Physical Exam: Filed Vitals:   10/30/11 1201 10/30/11 1546 10/30/11 1728 10/30/11 1845  BP: 137/85 134/83 131/75 134/91  Pulse: 120 119 105 111  Temp: 98.8 F (37.1 C) 100.4 F (38 C) 99.9 F (37.7 C) 98.6 F (37 C)  TempSrc: Oral Oral Oral   Resp:  20 20 18   Height:    5\' 10"  (1.778 m)  Weight:    90.6 kg (199  lb 11.8 oz)  SpO2: 100% 97% 96% 97%   General appearance: alert, cooperative and no distress Resp: clear to auscultation bilaterally Cardio: regular rate and rhythm, S1, S2 normal, no murmur, click, rub or gallop GI: soft, non-tender; bowel sounds normal; no masses,  no organomegaly Extremities: lle normal.  rle 4th and 5th toes necrotic/black appearing with surrounding erythemia of the forefoot.  cannot express any discharge myself.  mod amt of swelling and painful to touch.  pulses intact. Pulses: 2+ and symmetric Skin: Skin color, texture, turgor normal. No rashes or lesions other than rle discription above Neurologic: Grossly normal   Labs on Admission:   [facility] 10/30/11 1603 10/27/11 2156  NA 134* 135  K 3.9 3.8  CL 96 99  CO2 26 24  GLUCOSE 157* 142*  BUN 11 12  CREATININE 0.58 0.59  CALCIUM 10.1 10.0  MG -- --  PHOS -- --    Basename 10/30/11 1603 10/27/11 2156  WBC 16.8* 14.8*  NEUTROABS -- 11.3*  HGB 11.9* 12.8  HCT 33.9* 36.1  MCV 86.7 87.4  PLT 258 243    Radiological Exams on Admission: Dg Foot Complete Right  10/30/2011  *RADIOLOGY REPORT*  Clinical Data: Worsening cellulitis right foot question subcutaneous gas, pain, swelling, redness  RIGHT FOOT COMPLETE - 3+ VIEW  Comparison: 10/27/2011  Findings: Osseous mineralization normal. Joint spaces preserved. Nondisplaced intra-articular fraction at medial aspect, base of proximal phalanx right second toe. No additional fracture or dislocation identified. Multiple foci of soft tissue gas are now identified between the bases of the fourth and fifth toes, extending into the proximal aspects of both toes, compatible with cellulitis/soft tissue infection by a gas forming organism. On the lateral view, unable to exclude cortical destruction at the plantar aspect of the base of the proximal phalanx of the fifth toe, though this could be an artifact.  IMPRESSION: Nondisplaced intra-articular fracture at base of proximal  phalanx right second toe. Extensive soft tissue gas between the bases of the fourth and fifth toes compatible with extensive soft tissue infection by a gas forming organism. Unable to exclude bone destruction/osteomyelitis at the base of the proximal phalanx fifth toe. Note that radiographs are unable to exclude septic arthritis. If further imaging is required, consider MR imaging of the right foot with and without contrast.  Original Report Authenticated By: Lollie Marrow, M.D.   Dg Foot Complete Right  10/27/2011  *RADIOLOGY REPORT*  Clinical Data: Diabetic with pain, swelling and erythema involving the fourth and fifth metatarsals.  No known injury.  RIGHT FOOT COMPLETE - 3+ VIEW  Comparison: None.  Findings: There is a nondisplaced fracture involving the medial base of the second proximal phalanx.  No displaced fracture, dislocation or bone destruction is identified.  Diffuse forefoot soft tissue swelling  is present.  There is no evidence of soft tissue emphysema.  IMPRESSION:  1.  Nondisplaced intra-articular fracture involving the base of the right second proximal phalanx. 2.  Forefoot soft tissue swelling.  No radiographic evidence of osteomyelitis.  Original Report Authenticated By: Gerrianne Scale, M.D.    Assessment/Plan Present on Admission:   30 year old female with progressive worsening diabetic right lower extremity foot infection despite one month of outpatient oral antibiotics 1. Right lower extremity diabetic foot infection we'll place on IV vancomycin and Zosyn and obtain orthopedic consultation. Will keep patient n.p.o. overnight. EDP has already called orthopedic surgery on call who apparently asked to formally consult which has been done. Will hold off on any anticoagulants at this time until operative plan has been clarified. 2. Uncontrolled insulin-dependent diabetes we'll decrease her Lantus dose from 31 units to 15 units daily and cover her with sliding scale insulin. Hold her  metformin. 3. Hypertension Stable continue home medications.   Gaines Cartmell A 10/30/2011, 8:18 PM

## 2011-10-30 NOTE — Consult Note (Signed)
ANTIBIOTIC CONSULT NOTE - INITIAL  Pharmacy Consult for vancomycin and zosyn Indication: wound infection  No Known Allergies  Patient Measurements: Height: 5\' 10"  (177.8 cm) Weight: 199 lb 11.8 oz (90.6 kg) IBW/kg (Calculated) : 68.5   Vital Signs: Temp: 98.6 F (37 C) (12/07 1845) Temp src: Oral (12/07 1728) BP: 134/91 mmHg (12/07 1845) Pulse Rate: 111  (12/07 1845) Intake/Output from previous day:   Intake/Output from this shift:    Labs:  [facility] 10/30/11 1603 10/27/11 2156  WBC 16.8* 14.8*  HGB 11.9* 12.8  PLT 258 243  LABCREA -- --  CREATININE 0.58 0.59   Estimated Creatinine Clearance: 124.3 ml/min (by C-G formula based on Cr of 0.58). No results found for this basename: VANCOTROUGH:2,VANCOPEAK:2,VANCORANDOM:2,GENTTROUGH:2,GENTPEAK:2,GENTRANDOM:2,TOBRATROUGH:2,TOBRAPEAK:2,TOBRARND:2,AMIKACINPEAK:2,AMIKACINTROU:2,AMIKACIN:2, in the last 72 hours   Microbiology: Recent Results (from the past 720 hour(s))  CULTURE, BLOOD (ROUTINE X 2)     Status: Normal (Preliminary result)   Collection Time   10/30/11  4:03 PM      Component Value Range Status Comment   Specimen Description BLOOD BLOOD LEFT ARM   Final    Special Requests     Final    Value: BOTTLES DRAWN AEROBIC AND ANAEROBIC 7CC DRAWN BY RN   Culture PENDING   Incomplete    Report Status PENDING   Incomplete   CULTURE, BLOOD (ROUTINE X 2)     Status: Normal (Preliminary result)   Collection Time   10/30/11  4:13 PM      Component Value Range Status Comment   Specimen Description BLOOD RIGHT ANTECUBITAL   Final    Special Requests BOTTLES DRAWN AEROBIC AND ANAEROBIC [facility]   Final    Culture PENDING   Incomplete    Report Status PENDING   Incomplete     Medical History: Past Medical History  Diagnosis Date  . Diabetes mellitus   . Hypertension   . Acid reflux     Medications:  Scheduled:    . sodium chloride   Intravenous STAT  . insulin aspart  0-9 Units Subcutaneous TID WC  . insulin glargine   15 Units Subcutaneous QHS  . lisinopril  5 mg Oral Daily  . morphine  4 mg Intravenous Once  . piperacillin-tazobactam  3.375 g Intravenous Once  . piperacillin-tazobactam (ZOSYN)  IV  3.375 g Intravenous Q8H  . simvastatin  10 mg Oral q1800  . vancomycin  1,000 mg Intravenous Once  . vancomycin  1,000 mg Intravenous Q12H  . DISCONTD: HYDROcodone-acetaminophen  1 tablet Oral Once  . DISCONTD: influenza  inactive virus vaccine  0.5 mL Intramuscular Tomorrow-1000   Assessment: Good renal fxn  Goal of Therapy:  Vancomycin trough level 10-15 mcg/ml  Plan: Zosyn 3.375gm iv q8hrs Vancomycin 1gm iv q12hrs Check trough at steady state. Labs per protocol  Valrie Hart A 10/30/2011,8:56 PM

## 2011-10-30 NOTE — ED Notes (Signed)
Pt c/o n/v and changes to the diabetic ulcer on her right foot; pinky toe is purple and the surrounding tissue is red; pt states the wound has a bloody yellow discharge

## 2011-10-31 ENCOUNTER — Encounter (HOSPITAL_COMMUNITY): Payer: Self-pay | Admitting: Internal Medicine

## 2011-10-31 DIAGNOSIS — I96 Gangrene, not elsewhere classified: Secondary | ICD-10-CM | POA: Diagnosis present

## 2011-10-31 DIAGNOSIS — E109 Type 1 diabetes mellitus without complications: Secondary | ICD-10-CM

## 2011-10-31 DIAGNOSIS — E11628 Type 2 diabetes mellitus with other skin complications: Secondary | ICD-10-CM | POA: Diagnosis present

## 2011-10-31 HISTORY — DX: Type 1 diabetes mellitus without complications: E10.9

## 2011-10-31 LAB — GLUCOSE, CAPILLARY: Glucose-Capillary: 221 mg/dL — ABNORMAL HIGH (ref 70–99)

## 2011-10-31 LAB — HEMOGLOBIN A1C: Hgb A1c MFr Bld: 8.3 % — ABNORMAL HIGH (ref <5.7)

## 2011-10-31 LAB — CBC
HCT: 28.7 % — ABNORMAL LOW (ref 36.0–46.0)
Platelets: 214 10*3/uL (ref 150–400)
RDW: 11.8 % (ref 11.5–15.5)
WBC: 12.3 10*3/uL — ABNORMAL HIGH (ref 4.0–10.5)

## 2011-10-31 LAB — BASIC METABOLIC PANEL
Calcium: 8.9 mg/dL (ref 8.4–10.5)
Chloride: 99 mEq/L (ref 96–112)
Creatinine, Ser: 0.63 mg/dL (ref 0.50–1.10)
GFR calc Af Amer: >90 mL/min (ref >90)
GFR calc non Af Amer: >90 mL/min (ref >90)

## 2011-10-31 LAB — PROTIME-INR
INR: 1.38 (ref 0.00–1.49)
Prothrombin Time: 17.2 seconds — ABNORMAL HIGH (ref 11.6–15.2)

## 2011-10-31 LAB — C-REACTIVE PROTEIN: CRP: 17.01 mg/dL — ABNORMAL HIGH (ref <0.60)

## 2011-10-31 MED ORDER — MORPHINE SULFATE 2 MG/ML IJ SOLN: 12.0000 mg | INTRAMUSCULAR | Status: DC | PRN

## 2011-10-31 MED ORDER — MORPHINE SULFATE 2 MG/ML IJ SOLN
1.0000 mg | INTRAMUSCULAR | Status: DC | PRN
  Administered 2011-10-31 – 2011-11-02 (×10): 2 mg via INTRAVENOUS
  Filled 2011-10-31 (×10): qty 1

## 2011-10-31 MED ORDER — INSULIN GLARGINE 100 UNIT/ML ~~LOC~~ SOLN
30.0000 [IU] | Freq: Every day | SUBCUTANEOUS | Status: DC
  Administered 2011-10-31 – 2011-11-01 (×2): 30 [IU] via SUBCUTANEOUS

## 2011-10-31 MED ORDER — INSULIN ASPART 100 UNIT/ML ~~LOC~~ SOLN
6.0000 [IU] | Freq: Two times a day (BID) | SUBCUTANEOUS | Status: DC
  Administered 2011-10-31 – 2011-11-04 (×7): 6 [IU] via SUBCUTANEOUS

## 2011-10-31 NOTE — Consult Note (Signed)
Reason for Consult: Diabetic foot Referring Physician: Farrel Gobble is an 30 y.o. female.  HPI: 30 year old female with diabetes did a home pedicure approximately one month ago removing a callus from the lateral plantar aspect of her foot presented to her family physician and was treated with clindamycin. Approximately a week ago pain and redness increase she went to the emergency room she was put on oral antibiotics and given IV antibiotics and told to followup with Dr. Lovell Sheehan but could not get an appointment on Friday he came back to the emergency room was admitted with a cellulitis of the right foot.  Past Medical History  Diagnosis Date  . Diabetes mellitus   . Hypertension   . Acid reflux     Past Surgical History  Procedure Date  . Appendectomy   . Cesarean section     History reviewed. No pertinent family history.  Social History:  reports that she has been smoking.  She does not have any smokeless tobacco history on file. She reports that she does not drink alcohol. Her drug history not on file.  Allergies: No Known Allergies  Medications: I have reviewed the patient's current medications.  Results for orders placed during the hospital encounter of 10/30/11 (from the past 48 hour(s))  CBC     Status: Abnormal   Collection Time   10/30/11  4:03 PM      Component Value Range Comment   WBC 16.8 (*) 4.0 - 10.5 (K/uL)    RBC 3.91  3.87 - 5.11 (MIL/uL)    Hemoglobin 11.9 (*) 12.0 - 15.0 (g/dL)    HCT 14.7 (*) 82.9 - 46.0 (%)    MCV 86.7  78.0 - 100.0 (fL)    MCH 30.4  26.0 - 34.0 (pg)    MCHC 35.1  30.0 - 36.0 (g/dL)    RDW 56.2  13.0 - 86.5 (%)    Platelets 258  150 - 400 (K/uL)   BASIC METABOLIC PANEL     Status: Abnormal   Collection Time   10/30/11  4:03 PM      Component Value Range Comment   Sodium 134 (*) 135 - 145 (mEq/L)    Potassium 3.9  3.5 - 5.1 (mEq/L)    Chloride 96  96 - 112 (mEq/L)    CO2 26  19 - 32 (mEq/L)    Glucose, Bld 157 (*) 70 -  99 (mg/dL)    BUN 11  6 - 23 (mg/dL)    Creatinine, Ser 7.84  0.50 - 1.10 (mg/dL)    Calcium 69.6  8.4 - 10.5 (mg/dL)    GFR calc non Af Amer >90  >90 (mL/min)    GFR calc Af Amer >90  >90 (mL/min)   SEDIMENTATION RATE     Status: Abnormal   Collection Time   10/30/11  4:03 PM      Component Value Range Comment   Sed Rate 83 (*) 0 - 22 (mm/hr)   C-REACTIVE PROTEIN     Status: Abnormal   Collection Time   10/30/11  4:03 PM      Component Value Range Comment   CRP 17.01 (*) <0.60 (mg/dL)   CULTURE, BLOOD (ROUTINE X 2)     Status: Normal (Preliminary result)   Collection Time   10/30/11  4:03 PM      Component Value Range Comment   Specimen Description BLOOD BLOOD LEFT ARM      Special Requests  Value: BOTTLES DRAWN AEROBIC AND ANAEROBIC 7CC DRAWN BY RN   Culture NO GROWTH 1 DAY      Report Status PENDING     HEMOGLOBIN A1C     Status: Abnormal   Collection Time   10/30/11  4:03 PM      Component Value Range Comment   Hemoglobin A1C 8.3 (*) <5.7 (%)    Mean Plasma Glucose 192 (*) <117 (mg/dL)   CULTURE, BLOOD (ROUTINE X 2)     Status: Normal (Preliminary result)   Collection Time   10/30/11  4:13 PM      Component Value Range Comment   Specimen Description BLOOD RIGHT ANTECUBITAL      Special Requests BOTTLES DRAWN AEROBIC AND ANAEROBIC 7CC      Culture NO GROWTH 1 DAY      Report Status PENDING     GLUCOSE, CAPILLARY     Status: Abnormal   Collection Time   10/30/11  9:41 PM      Component Value Range Comment   Glucose-Capillary 199 (*) 70 - 99 (mg/dL)   BASIC METABOLIC PANEL     Status: Abnormal   Collection Time   10/31/11  4:36 AM      Component Value Range Comment   Sodium 134 (*) 135 - 145 (mEq/L)    Potassium 4.0  3.5 - 5.1 (mEq/L)    Chloride 99  96 - 112 (mEq/L)    CO2 26  19 - 32 (mEq/L)    Glucose, Bld 204 (*) 70 - 99 (mg/dL)    BUN 9  6 - 23 (mg/dL)    Creatinine, Ser 1.61  0.50 - 1.10 (mg/dL)    Calcium 8.9  8.4 - 10.5 (mg/dL)    GFR calc non Af Amer  >90  >90 (mL/min)    GFR calc Af Amer >90  >90 (mL/min)   CBC     Status: Abnormal   Collection Time   10/31/11  4:36 AM      Component Value Range Comment   WBC 12.3 (*) 4.0 - 10.5 (K/uL)    RBC 3.32 (*) 3.87 - 5.11 (MIL/uL)    Hemoglobin 10.0 (*) 12.0 - 15.0 (g/dL)    HCT 09.6 (*) 04.5 - 46.0 (%)    MCV 86.4  78.0 - 100.0 (fL)    MCH 30.1  26.0 - 34.0 (pg)    MCHC 34.8  30.0 - 36.0 (g/dL)    RDW 40.9  81.1 - 91.4 (%)    Platelets 214  150 - 400 (K/uL)   PROTIME-INR     Status: Abnormal   Collection Time   10/31/11  4:36 AM      Component Value Range Comment   Prothrombin Time 17.2 (*) 11.6 - 15.2 (seconds)    INR 1.38  0.00 - 1.49    GLUCOSE, CAPILLARY     Status: Abnormal   Collection Time   10/31/11  7:24 AM      Component Value Range Comment   Glucose-Capillary 170 (*) 70 - 99 (mg/dL)    Comment 1 Documented in Chart      Comment 2 Notify RN       Dg Foot Complete Right  10/30/2011  *RADIOLOGY REPORT*  Clinical Data: Worsening cellulitis right foot question subcutaneous gas, pain, swelling, redness  RIGHT FOOT COMPLETE - 3+ VIEW  Comparison: 10/27/2011  Findings: Osseous mineralization normal. Joint spaces preserved. Nondisplaced intra-articular fraction at medial aspect, base of proximal phalanx right second toe.  No additional fracture or dislocation identified. Multiple foci of soft tissue gas are now identified between the bases of the fourth and fifth toes, extending into the proximal aspects of both toes, compatible with cellulitis/soft tissue infection by a gas forming organism. On the lateral view, unable to exclude cortical destruction at the plantar aspect of the base of the proximal phalanx of the fifth toe, though this could be an artifact.  IMPRESSION: Nondisplaced intra-articular fracture at base of proximal phalanx right second toe. Extensive soft tissue gas between the bases of the fourth and fifth toes compatible with extensive soft tissue infection by a gas forming  organism. Unable to exclude bone destruction/osteomyelitis at the base of the proximal phalanx fifth toe. Note that radiographs are unable to exclude septic arthritis. If further imaging is required, consider MR imaging of the right foot with and without contrast.  Original Report Authenticated By: Lollie Marrow, M.D.    ROS Blood pressure 147/72, pulse 91, temperature 98.3 F (36.8 C), temperature source Oral, resp. rate 22, height 5\' 10"  (1.778 m), weight 91.9 kg (202 lb 9.6 oz), last menstrual period 10/03/2011, SpO2 95.00%. Physical Exam  Constitutional: She is oriented to person, place, and time. She appears well-developed and well-nourished. No distress.  HENT:  Head: Normocephalic.  Neck: Normal range of motion.  Cardiovascular: Normal rate.   Respiratory: Effort normal.  Musculoskeletal:       Feet:  Neurological: She is alert and oriented to person, place, and time.  Skin: Skin is warm. There is erythema.  Psychiatric: She has a normal mood and affect. Her behavior is normal.    Assessment/Plan: DIABETIC FOOT INFECTION 2ND PROX PAHALANX FRACTURE NO TREATMENT NEEDED CALL GENERAL SURGERY TO ASSESS   Fuller Canada 10/31/2011, 11:28 AM

## 2011-10-31 NOTE — Consults (Signed)
WOC consult Note Reason for Consult:Right foot wound; patient has diabetes.  NB: This is not a diabetic (neuropathic) foot ulcer.   Wound type:This appears to be a would whose etiology is initially traumatic, now infectious Measurement:dorsal aspect of right foot at base of 4th and 5th digit.  Initial injury was at lateral right foot (secondary to patient removing callus with pumice stone too vigorously (she states). Wound VWU:JWJX purple/blue with surrounding erythema Drainage (amount, consistency, odor) None at this time Periwound:erythematous with some induration and edema Dressing procedure/placement/frequency:I will not suggest a dressing at this time, but elevation until surgical consult is obtained. This wound exceeds the scope of practice for a WOC Nurse and I have no recommendations/we will not follow. Please re-consult as needed. Thanks, Ladona Mow, MSN, RN, [facility], CWOCN [phone])

## 2011-10-31 NOTE — Progress Notes (Signed)
Subjective: Having pain in foot, feels hungry.  Objective:  Vital signs in last 24 hours:  Filed Vitals:   10/30/11 1728 10/30/11 1845 10/30/11 2143 10/31/11 0601  BP: 131/75 134/91 115/74 147/72  Pulse: 105 111 110 91  Temp: 99.9 F (37.7 C) 98.6 F (37 C) 98.4 F (36.9 C) 98.3 F (36.8 C)  TempSrc: Oral  Oral Oral  Resp: 20 18 18 22   Height:  5\' 10"  (1.778 m)    Weight:  90.6 kg (199 lb 11.8 oz)  91.9 kg (202 lb 9.6 oz)  SpO2: 96% 97% 96% 95%    Intake/Output from previous day:   Intake/Output Summary (Last 24 hours) at 10/31/11 1325 Last data filed at 10/31/11 0600  Gross per 24 hour  Intake   1400 ml  Output      0 ml  Net   1400 ml    Physical Exam: General: Alert, awake, oriented x3, in no acute distress. HEENT: No bruits, no goiter. Moist mucous membranes, no scleral icterus, no conjunctival pallor. Heart: Regular rate and rhythm, without murmurs, rubs, gallops. Lungs: Clear to auscultation bilaterally. No wheezing, no rhonchi, no rales.  Abdomen: Soft, nontender, nondistended, positive bowel sounds. Extremities: necrotic/foul smelling tissue noted between the 4th and 5th digits on right foot Neuro: Grossly intact, nonfocal.    Lab Results:  Basic Metabolic Panel:    Component Value Date/Time   NA 134* 10/31/2011 0436   K 4.0 10/31/2011 0436   CL 99 10/31/2011 0436   CO2 26 10/31/2011 0436   BUN 9 10/31/2011 0436   CREATININE 0.63 10/31/2011 0436   GLUCOSE 204* 10/31/2011 0436   CALCIUM 8.9 10/31/2011 0436   CBC:    Component Value Date/Time   WBC 12.3* 10/31/2011 0436   HGB 10.0* 10/31/2011 0436   HCT 28.7* 10/31/2011 0436   PLT 214 10/31/2011 0436   MCV 86.4 10/31/2011 0436   NEUTROABS 11.3* 10/27/2011 2156   LYMPHSABS 2.6 10/27/2011 2156   MONOABS 0.8 10/27/2011 2156   EOSABS 0.1 10/27/2011 2156   BASOSABS 0.0 10/27/2011 2156      Lab 10/31/11 0436 10/30/11 1603 10/27/11 2156  WBC 12.3* 16.8* 14.8*  HGB 10.0* 11.9* 12.8  HCT 28.7* 33.9* 36.1    PLT 214 258 243  MCV 86.4 86.7 87.4  MCH 30.1 30.4 31.0  MCHC 34.8 35.1 35.5  RDW 11.8 12.0 12.1  LYMPHSABS -- -- 2.6  MONOABS -- -- 0.8  EOSABS -- -- 0.1  BASOSABS -- -- 0.0  BANDABS -- -- --    Lab 10/31/11 0436 10/30/11 1603 10/27/11 2156  NA 134* 134* 135  K 4.0 3.9 3.8  CL 99 96 99  CO2 26 26 24   GLUCOSE 204* 157* 142*  BUN 9 11 12   CREATININE 0.63 0.58 0.59  CALCIUM 8.9 10.1 10.0  MG -- -- --    Lab 10/31/11 0436  INR 1.38  PROTIME --   Cardiac markers: No results found for this basename: CK:3,CKMB:3,TROPONINI:3,MYOGLOBIN:3 in the last 168 hours No results found for this basename: POCBNP:3 in the last 168 hours Recent Results (from the past 240 hour(s))  CULTURE, BLOOD (ROUTINE X 2)     Status: Normal (Preliminary result)   Collection Time   10/30/11  4:03 PM      Component Value Range Status Comment   Specimen Description BLOOD BLOOD LEFT ARM   Final    Special Requests     Final    Value: BOTTLES DRAWN AEROBIC  AND ANAEROBIC 7CC DRAWN BY RN   Culture NO GROWTH 1 DAY   Final    Report Status PENDING   Incomplete   CULTURE, BLOOD (ROUTINE X 2)     Status: Normal (Preliminary result)   Collection Time   10/30/11  4:13 PM      Component Value Range Status Comment   Specimen Description BLOOD RIGHT ANTECUBITAL   Final    Special Requests BOTTLES DRAWN AEROBIC AND ANAEROBIC 7CC   Final    Culture NO GROWTH 1 DAY   Final    Report Status PENDING   Incomplete     Studies/Results: Dg Foot Complete Right  10/30/2011  *RADIOLOGY REPORT*  Clinical Data: Worsening cellulitis right foot question subcutaneous gas, pain, swelling, redness  RIGHT FOOT COMPLETE - 3+ VIEW  Comparison: 10/27/2011  Findings: Osseous mineralization normal. Joint spaces preserved. Nondisplaced intra-articular fraction at medial aspect, base of proximal phalanx right second toe. No additional fracture or dislocation identified. Multiple foci of soft tissue gas are now identified between the bases  of the fourth and fifth toes, extending into the proximal aspects of both toes, compatible with cellulitis/soft tissue infection by a gas forming organism. On the lateral view, unable to exclude cortical destruction at the plantar aspect of the base of the proximal phalanx of the fifth toe, though this could be an artifact.  IMPRESSION: Nondisplaced intra-articular fracture at base of proximal phalanx right second toe. Extensive soft tissue gas between the bases of the fourth and fifth toes compatible with extensive soft tissue infection by a gas forming organism. Unable to exclude bone destruction/osteomyelitis at the base of the proximal phalanx fifth toe. Note that radiographs are unable to exclude septic arthritis. If further imaging is required, consider MR imaging of the right foot with and without contrast.  Original Report Authenticated By: Lollie Marrow, M.D.    Medications: Scheduled Meds:   . sodium chloride   Intravenous STAT  . insulin aspart  0-9 Units Subcutaneous TID WC  . insulin glargine  15 Units Subcutaneous QHS  . lisinopril  5 mg Oral Daily  . morphine  4 mg Intravenous Once  . piperacillin-tazobactam  3.375 g Intravenous Once  . piperacillin-tazobactam (ZOSYN)  IV  3.375 g Intravenous Q8H  . simvastatin  10 mg Oral q1800  . vancomycin  1,000 mg Intravenous Once  . vancomycin  1,000 mg Intravenous Q12H  . DISCONTD: HYDROcodone-acetaminophen  1 tablet Oral Once  . DISCONTD: influenza  inactive virus vaccine  0.5 mL Intramuscular Tomorrow-1000   Continuous Infusions:   . sodium chloride 75 mL/hr at 10/30/11 2223   PRN Meds:.morphine, ondansetron (ZOFRAN) IV, ondansetron, oxyCODONE-acetaminophen, DISCONTD:  HYDROmorphone (DILAUDID) injection  Assessment/Plan:  Active Problems:  Diabetes mellitus type 1  Cellulitis in diabetic foot  Gangrene of foot  Plan:  Patient is on IV antibiotics with vancomycin and zosyn Her WBC count has improved Continue IV fluids Spoke  with Dr. Lovell Sheehan who will see the patient No plans for surgery today Resume diet and home dose of insulins   LOS: 1 day   Katye Valek 10/31/2011, 1:25 PM

## 2011-11-01 ENCOUNTER — Other Ambulatory Visit: Payer: Self-pay

## 2011-11-01 LAB — BASIC METABOLIC PANEL
BUN: 8 mg/dL (ref 6–23)
CO2: 26 mEq/L (ref 19–32)
Chloride: 99 mEq/L (ref 96–112)
Glucose, Bld: 186 mg/dL — ABNORMAL HIGH (ref 70–99)
Potassium: 3.8 mEq/L (ref 3.5–5.1)
Sodium: 132 mEq/L — ABNORMAL LOW (ref 135–145)

## 2011-11-01 LAB — GLUCOSE, CAPILLARY
Glucose-Capillary: 171 mg/dL — ABNORMAL HIGH (ref 70–99)
Glucose-Capillary: 159 mg/dL — ABNORMAL HIGH (ref 70–99)
Glucose-Capillary: 181 mg/dL — ABNORMAL HIGH (ref 70–99)

## 2011-11-01 LAB — CBC
HCT: 28 % — ABNORMAL LOW (ref 36.0–46.0)
Hemoglobin: 9.8 g/dL — ABNORMAL LOW (ref 12.0–15.0)
MCHC: 35 g/dL (ref 30.0–36.0)
RBC: 3.24 MIL/uL — ABNORMAL LOW (ref 3.87–5.11)

## 2011-11-01 LAB — VANCOMYCIN, RANDOM: Vancomycin Rm: <5 ug/mL

## 2011-11-01 MED ORDER — VANCOMYCIN HCL IN DEXTROSE 1-5 GM/200ML-% IV SOLN
1000.0000 mg | Freq: Three times a day (TID) | INTRAVENOUS | Status: DC
  Administered 2011-11-01 – 2011-11-05 (×12): 1000 mg via INTRAVENOUS
  Filled 2011-11-01 (×16): qty 200

## 2011-11-01 NOTE — Consult Note (Signed)
Reason for Consult: Cellulitis with gangrene, right foot Referring Physician: [facility], [facility]  Anne [name] is an 30 y.o. female.  HPI: Patient is a 30 year old white female with a long-standing history of diabetes mellitus who was trimming her own nails on her foot when she inadvertently remove some skin. This subsequently became infected. This occurred over one month ago. She's been trying to treat this with oral antibiotics. She was seen earlier in the week and received one dose of intravenous antibiotic. She was supposed to see me in my office as an outpatient for initial consultation but presented emergency room with worsening pain and drainage from the right foot. She was admitted for intravenous vancomycin therapy and surgery consultation was obtained.  Past Medical History  Diagnosis Date  . Diabetes mellitus   . Hypertension   . Acid reflux   . Diabetes mellitus type 1 10/31/2011    Past Surgical History  Procedure Date  . Appendectomy   . Cesarean section     History reviewed. No pertinent family history.  Social History:  reports that she has been smoking.  She does not have any smokeless tobacco history on file. She reports that she does not drink alcohol. Her drug history not on file.  Allergies: No Known Allergies  Medications: I have reviewed the patient's current medications.  Results for orders placed during the hospital encounter of 10/30/11 (from the past 48 hour(s))  CBC     Status: Abnormal   Collection Time   10/30/11  4:03 PM      Component Value Range Comment   WBC 16.8 (*) 4.0 - 10.5 (K/uL)    RBC 3.91  3.87 - 5.11 (MIL/uL)    Hemoglobin 11.9 (*) 12.0 - 15.0 (g/dL)    HCT 62.1 (*) 30.8 - 46.0 (%)    MCV 86.7  78.0 - 100.0 (fL)    MCH 30.4  26.0 - 34.0 (pg)    MCHC 35.1  30.0 - 36.0 (g/dL)    RDW 65.7  84.6 - 96.2 (%)    Platelets 258  150 - 400 (K/uL)   BASIC METABOLIC PANEL     Status: Abnormal   Collection Time     10/30/11  4:03 PM      Component Value Range Comment   Sodium 134 (*) 135 - 145 (mEq/L)    Potassium 3.9  3.5 - 5.1 (mEq/L)    Chloride 96  96 - 112 (mEq/L)    CO2 26  19 - 32 (mEq/L)    Glucose, Bld 157 (*) 70 - 99 (mg/dL)    BUN 11  6 - 23 (mg/dL)    Creatinine, Ser 9.52  0.50 - 1.10 (mg/dL)    Calcium 84.1  8.4 - 10.5 (mg/dL)    GFR calc non Af Amer >90  >90 (mL/min)    GFR calc Af Amer >90  >90 (mL/min)   SEDIMENTATION RATE     Status: Abnormal   Collection Time   10/30/11  4:03 PM      Component Value Range Comment   Sed Rate 83 (*) 0 - 22 (mm/hr)   C-REACTIVE PROTEIN     Status: Abnormal   Collection Time   10/30/11  4:03 PM      Component Value Range Comment   CRP 17.01 (*) <0.60 (mg/dL)   CULTURE, BLOOD (ROUTINE X 2)     Status: Normal (Preliminary result)   Collection Time   10/30/11  4:03 PM  Component Value Range Comment   Specimen Description BLOOD BLOOD LEFT ARM      Special Requests        Value: BOTTLES DRAWN AEROBIC AND ANAEROBIC 7CC DRAWN BY RN   Culture NO GROWTH 1 DAY      Report Status PENDING     HEMOGLOBIN A1C     Status: Abnormal   Collection Time   10/30/11  4:03 PM      Component Value Range Comment   Hemoglobin A1C 8.3 (*) <5.7 (%)    Mean Plasma Glucose 192 (*) <117 (mg/dL)   CULTURE, BLOOD (ROUTINE X 2)     Status: Normal (Preliminary result)   Collection Time   10/30/11  4:13 PM      Component Value Range Comment   Specimen Description BLOOD RIGHT ANTECUBITAL      Special Requests BOTTLES DRAWN AEROBIC AND ANAEROBIC 7CC      Culture NO GROWTH 1 DAY      Report Status PENDING     GLUCOSE, CAPILLARY     Status: Abnormal   Collection Time   10/30/11  9:41 PM      Component Value Range Comment   Glucose-Capillary 199 (*) 70 - 99 (mg/dL)   BASIC METABOLIC PANEL     Status: Abnormal   Collection Time   10/31/11  4:36 AM      Component Value Range Comment   Sodium 134 (*) 135 - 145 (mEq/L)    Potassium 4.0  3.5 - 5.1 (mEq/L)    Chloride 99   96 - 112 (mEq/L)    CO2 26  19 - 32 (mEq/L)    Glucose, Bld 204 (*) 70 - 99 (mg/dL)    BUN 9  6 - 23 (mg/dL)    Creatinine, Ser 7.82  0.50 - 1.10 (mg/dL)    Calcium 8.9  8.4 - 10.5 (mg/dL)    GFR calc non Af Amer >90  >90 (mL/min)    GFR calc Af Amer >90  >90 (mL/min)   CBC     Status: Abnormal   Collection Time   10/31/11  4:36 AM      Component Value Range Comment   WBC 12.3 (*) 4.0 - 10.5 (K/uL)    RBC 3.32 (*) 3.87 - 5.11 (MIL/uL)    Hemoglobin 10.0 (*) 12.0 - 15.0 (g/dL)    HCT 95.6 (*) 21.3 - 46.0 (%)    MCV 86.4  78.0 - 100.0 (fL)    MCH 30.1  26.0 - 34.0 (pg)    MCHC 34.8  30.0 - 36.0 (g/dL)    RDW 08.6  57.8 - 46.9 (%)    Platelets 214  150 - 400 (K/uL)   PROTIME-INR     Status: Abnormal   Collection Time   10/31/11  4:36 AM      Component Value Range Comment   Prothrombin Time 17.2 (*) 11.6 - 15.2 (seconds)    INR 1.38  0.00 - 1.49    GLUCOSE, CAPILLARY     Status: Abnormal   Collection Time   10/31/11  7:24 AM      Component Value Range Comment   Glucose-Capillary 170 (*) 70 - 99 (mg/dL)    Comment 1 Documented in Chart      Comment 2 Notify RN     GLUCOSE, CAPILLARY     Status: Abnormal   Collection Time   10/31/11 11:38 AM      Component Value Range Comment   Glucose-Capillary  126 (*) 70 - 99 (mg/dL)    Comment 1 Documented in Chart      Comment 2 Notify RN     GLUCOSE, CAPILLARY     Status: Abnormal   Collection Time   10/31/11  4:54 PM      Component Value Range Comment   Glucose-Capillary 227 (*) 70 - 99 (mg/dL)   GLUCOSE, CAPILLARY     Status: Abnormal   Collection Time   10/31/11  8:54 PM      Component Value Range Comment   Glucose-Capillary 221 (*) 70 - 99 (mg/dL)   VANCOMYCIN, RANDOM     Status: Normal   Collection Time   11/01/11  2:26 AM      Component Value Range Comment   Vancomycin Rm <5.0     CBC     Status: Abnormal   Collection Time   11/01/11  2:26 AM      Component Value Range Comment   WBC 12.1 (*) 4.0 - 10.5 (K/uL)    RBC 3.24 (*)  3.87 - 5.11 (MIL/uL)    Hemoglobin 9.8 (*) 12.0 - 15.0 (g/dL)    HCT 16.1 (*) 09.6 - 46.0 (%)    MCV 86.4  78.0 - 100.0 (fL)    MCH 30.2  26.0 - 34.0 (pg)    MCHC 35.0  30.0 - 36.0 (g/dL)    RDW 04.5  40.9 - 81.1 (%)    Platelets 215  150 - 400 (K/uL)   BASIC METABOLIC PANEL     Status: Abnormal   Collection Time   11/01/11  2:26 AM      Component Value Range Comment   Sodium 132 (*) 135 - 145 (mEq/L)    Potassium 3.8  3.5 - 5.1 (mEq/L)    Chloride 99  96 - 112 (mEq/L)    CO2 26  19 - 32 (mEq/L)    Glucose, Bld 186 (*) 70 - 99 (mg/dL)    BUN 8  6 - 23 (mg/dL)    Creatinine, Ser 9.14  0.50 - 1.10 (mg/dL)    Calcium 8.8  8.4 - 10.5 (mg/dL)    GFR calc non Af Amer >90  >90 (mL/min)    GFR calc Af Amer >90  >90 (mL/min)   GLUCOSE, CAPILLARY     Status: Abnormal   Collection Time   11/01/11  7:24 AM      Component Value Range Comment   Glucose-Capillary 171 (*) 70 - 99 (mg/dL)    Comment 1 Documented in Chart      Comment 2 Notify RN       Dg Foot Complete Right  10/30/2011  *RADIOLOGY REPORT*  Clinical Data: Worsening cellulitis right foot question subcutaneous gas, pain, swelling, redness  RIGHT FOOT COMPLETE - 3+ VIEW  Comparison: 10/27/2011  Findings: Osseous mineralization normal. Joint spaces preserved. Nondisplaced intra-articular fraction at medial aspect, base of proximal phalanx right second toe. No additional fracture or dislocation identified. Multiple foci of soft tissue gas are now identified between the bases of the fourth and fifth toes, extending into the proximal aspects of both toes, compatible with cellulitis/soft tissue infection by a gas forming organism. On the lateral view, unable to exclude cortical destruction at the plantar aspect of the base of the proximal phalanx of the fifth toe, though this could be an artifact.  IMPRESSION: Nondisplaced intra-articular fracture at base of proximal phalanx right second toe. Extensive soft tissue gas between the bases of the  fourth and  fifth toes compatible with extensive soft tissue infection by a gas forming organism. Unable to exclude bone destruction/osteomyelitis at the base of the proximal phalanx fifth toe. Note that radiographs are unable to exclude septic arthritis. If further imaging is required, consider MR imaging of the right foot with and without contrast.  Original Report Authenticated By: Lollie Marrow, M.D.    ROS: See chart Blood pressure 110/73, pulse 92, temperature 99 F (37.2 C), temperature source Oral, resp. rate 18, height 5\' 10"  (1.778 m), weight 91.9 kg (202 lb 9.6 oz), last menstrual period 10/03/2011, SpO2 95.00%. Physical Exam: Well-developed, well-nourished white female in no acute distress. Extremity examination reveals a slightly swollen right foot with bruising noted along the medial aspect. Gangrenous changes are noted in the skin and subcutaneous tissue at the base of the fourth and fifth digits. Active drainage is noted. The fifth toe is gangrenous and purple. Dorsalis pedis and posterior tibial pulses are easily palpable. The sole of the foot has no obvious gangrenous changes.  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 This is due to complications from diabetes mellitus. Plan: Continue local wound care and IV vancomycin. She subsequently will undergo a transmetatarsal amputation of the fourth and fifth digits. I did explain to the patient that do to her diabetes mellitus, she subsequently may lose additional toes in the future.  Zayley Arras A 11/01/2011, 11:00 AM

## 2011-11-01 NOTE — ED Provider Notes (Signed)
Evaluation and management procedures were performed by the PA/NP under my supervision/collaboration.    Steph Cheadle D Larsen Dungan, MD 11/01/11 2333 

## 2011-11-01 NOTE — Consult Note (Signed)
ANTIBIOTIC CONSULT NOTE   Pharmacy Consult for vancomycin and zosyn Indication: wound infection  No Known Allergies  Patient Measurements: Height: 5\' 10"  (177.8 cm) Weight: 202 lb 9.6 oz (91.9 kg) IBW/kg (Calculated) : 68.5   Vital Signs: Temp: 99 F (37.2 C) (12/09 0715) Temp src: Oral (12/09 0715) BP: 110/73 mmHg (12/09 0715) Pulse Rate: 92  (12/09 0715) Intake/Output from previous day: 12/08 0701 - 12/09 0700 In: 1690 [P.O.:240; I.V.:600; IV Piggyback:850] Out: -  Intake/Output from this shift:    Labs:  Basename 11/01/11 0226 10/31/11 0436 10/30/11 1603  WBC 12.1* 12.3* 16.8*  HGB 9.8* 10.0* 11.9*  PLT 215 214 258  LABCREA -- -- --  CREATININE 0.67 0.63 0.58   Estimated Creatinine Clearance: 125.3 ml/min (by C-G formula based on Cr of 0.67).  Basename 11/01/11 0226  VANCOTROUGH --  VANCOPEAK --  VANCORANDOM <5.0  GENTTROUGH --  GENTPEAK --  GENTRANDOM --  TOBRATROUGH --  TOBRAPEAK --  TOBRARND --  AMIKACINPEAK --  AMIKACINTROU --  AMIKACIN --     Microbiology: Recent Results (from the past 720 hour(s))  CULTURE, BLOOD (ROUTINE X 2)     Status: Normal (Preliminary result)   Collection Time   10/30/11  4:03 PM      Component Value Range Status Comment   Specimen Description BLOOD BLOOD LEFT ARM   Final    Special Requests     Final    Value: BOTTLES DRAWN AEROBIC AND ANAEROBIC 7CC DRAWN BY RN   Culture NO GROWTH 1 DAY   Final    Report Status PENDING   Incomplete   CULTURE, BLOOD (ROUTINE X 2)     Status: Normal (Preliminary result)   Collection Time   10/30/11  4:13 PM      Component Value Range Status Comment   Specimen Description BLOOD RIGHT ANTECUBITAL   Final    Special Requests BOTTLES DRAWN AEROBIC AND ANAEROBIC 7CC   Final    Culture NO GROWTH 1 DAY   Final    Report Status PENDING   Incomplete     Medical History: Past Medical History  Diagnosis Date  . Diabetes mellitus   . Hypertension   . Acid reflux   . Diabetes mellitus type  1 10/31/2011    Medications:  Scheduled:     . insulin aspart  0-9 Units Subcutaneous TID WC  . insulin aspart  6 Units Subcutaneous BID  . insulin glargine  30 Units Subcutaneous QHS  . lisinopril  5 mg Oral Daily  . piperacillin-tazobactam (ZOSYN)  IV  3.375 g Intravenous Q8H  . simvastatin  10 mg Oral q1800  . vancomycin  1,000 mg Intravenous Q8H  . DISCONTD: insulin glargine  15 Units Subcutaneous QHS  . DISCONTD: vancomycin  1,000 mg Intravenous Q12H   Assessment: Good renal fxn Trough level below goal  Goal of Therapy:  Vancomycin trough level 10-15 mcg/ml  Plan: Zosyn 3.375gm iv q8hrs Vancomycin 1gm iv q8hrs Re-Check trough at steady state. Labs per protocol  Valrie Hart A 11/01/2011,7:57 AM

## 2011-11-01 NOTE — Progress Notes (Signed)
Subjective: Having pain in foot, no other complaints.  Objective:  Vital signs in last 24 hours:  Filed Vitals:   10/31/11 0601 10/31/11 1400 10/31/11 2229 11/01/11 0715  BP: 147/72 136/82 114/75 110/73  Pulse: 91 96 104 92  Temp: 98.3 F (36.8 C) 98.1 F (36.7 C) 100.1 F (37.8 C) 99 F (37.2 C)  TempSrc: Oral Oral Oral Oral  Resp: 22 20 20 18   Height:      Weight: 91.9 kg (202 lb 9.6 oz)     SpO2: 95% 96% 93% 95%    Intake/Output from previous day:   Intake/Output Summary (Last 24 hours) at 11/01/11 1222 Last data filed at 11/01/11 0500  Gross per 24 hour  Intake   1690 ml  Output      0 ml  Net   1690 ml    Physical Exam: General: Alert, awake, oriented x3, in no acute distress. HEENT: No bruits, no goiter. Moist mucous membranes, no scleral icterus, no conjunctival pallor. Heart: Regular rate and rhythm, without murmurs, rubs, gallops. Lungs: Clear to auscultation bilaterally. No wheezing, no rhonchi, no rales.  Abdomen: Soft, nontender, nondistended, positive bowel sounds. Extremities: No clubbing cyanosis or edema,  positive pedal pulses. Neuro: Grossly intact, nonfocal.    Lab Results:  Basic Metabolic Panel:    Component Value Date/Time   NA 132* 11/01/2011 0226   K 3.8 11/01/2011 0226   CL 99 11/01/2011 0226   CO2 26 11/01/2011 0226   BUN 8 11/01/2011 0226   CREATININE 0.67 11/01/2011 0226   GLUCOSE 186* 11/01/2011 0226   CALCIUM 8.8 11/01/2011 0226   CBC:    Component Value Date/Time   WBC 12.1* 11/01/2011 0226   HGB 9.8* 11/01/2011 0226   HCT 28.0* 11/01/2011 0226   PLT 215 11/01/2011 0226   MCV 86.4 11/01/2011 0226   NEUTROABS 11.3* 10/27/2011 2156   LYMPHSABS 2.6 10/27/2011 2156   MONOABS 0.8 10/27/2011 2156   EOSABS 0.1 10/27/2011 2156   BASOSABS 0.0 10/27/2011 2156      Lab 11/01/11 0226 10/31/11 0436 10/30/11 1603 10/27/11 2156  WBC 12.1* 12.3* 16.8* 14.8*  HGB 9.8* 10.0* 11.9* 12.8  HCT 28.0* 28.7* 33.9* 36.1  PLT 215 214 258 243  MCV  86.4 86.4 86.7 87.4  MCH 30.2 30.1 30.4 31.0  MCHC 35.0 34.8 35.1 35.5  RDW 11.8 11.8 12.0 12.1  LYMPHSABS -- -- -- 2.6  MONOABS -- -- -- 0.8  EOSABS -- -- -- 0.1  BASOSABS -- -- -- 0.0  BANDABS -- -- -- --    Lab 11/01/11 0226 10/31/11 0436 10/30/11 1603 10/27/11 2156  NA 132* 134* 134* 135  K 3.8 4.0 3.9 3.8  CL 99 99 96 99  CO2 26 26 26 24   GLUCOSE 186* 204* 157* 142*  BUN 8 9 11 12   CREATININE 0.67 0.63 0.58 0.59  CALCIUM 8.8 8.9 10.1 10.0  MG -- -- -- --    Lab 10/31/11 0436  INR 1.38  PROTIME --   Cardiac markers: No results found for this basename: CK:3,CKMB:3,TROPONINI:3,MYOGLOBIN:3 in the last 168 hours No results found for this basename: POCBNP:3 in the last 168 hours Recent Results (from the past 240 hour(s))  CULTURE, BLOOD (ROUTINE X 2)     Status: Normal (Preliminary result)   Collection Time   10/30/11  4:03 PM      Component Value Range Status Comment   Specimen Description BLOOD BLOOD LEFT ARM   Final  Special Requests     Final    Value: BOTTLES DRAWN AEROBIC AND ANAEROBIC 7CC DRAWN BY RN   Culture NO GROWTH 1 DAY   Final    Report Status PENDING   Incomplete   CULTURE, BLOOD (ROUTINE X 2)     Status: Normal (Preliminary result)   Collection Time   10/30/11  4:13 PM      Component Value Range Status Comment   Specimen Description BLOOD RIGHT ANTECUBITAL   Final    Special Requests BOTTLES DRAWN AEROBIC AND ANAEROBIC 7CC   Final    Culture NO GROWTH 1 DAY   Final    Report Status PENDING   Incomplete     Studies/Results: Dg Foot Complete Right  10/30/2011  *RADIOLOGY REPORT*  Clinical Data: Worsening cellulitis right foot question subcutaneous gas, pain, swelling, redness  RIGHT FOOT COMPLETE - 3+ VIEW  Comparison: 10/27/2011  Findings: Osseous mineralization normal. Joint spaces preserved. Nondisplaced intra-articular fraction at medial aspect, base of proximal phalanx right second toe. No additional fracture or dislocation identified. Multiple  foci of soft tissue gas are now identified between the bases of the fourth and fifth toes, extending into the proximal aspects of both toes, compatible with cellulitis/soft tissue infection by a gas forming organism. On the lateral view, unable to exclude cortical destruction at the plantar aspect of the base of the proximal phalanx of the fifth toe, though this could be an artifact.  IMPRESSION: Nondisplaced intra-articular fracture at base of proximal phalanx right second toe. Extensive soft tissue gas between the bases of the fourth and fifth toes compatible with extensive soft tissue infection by a gas forming organism. Unable to exclude bone destruction/osteomyelitis at the base of the proximal phalanx fifth toe. Note that radiographs are unable to exclude septic arthritis. If further imaging is required, consider MR imaging of the right foot with and without contrast.  Original Report Authenticated By: Lollie Marrow, M.D.    Medications: Scheduled Meds:   . insulin aspart  0-9 Units Subcutaneous TID WC  . insulin aspart  6 Units Subcutaneous BID  . insulin glargine  30 Units Subcutaneous QHS  . lisinopril  5 mg Oral Daily  . piperacillin-tazobactam (ZOSYN)  IV  3.375 g Intravenous Q8H  . simvastatin  10 mg Oral q1800  . vancomycin  1,000 mg Intravenous Q8H  . DISCONTD: insulin glargine  15 Units Subcutaneous QHS  . DISCONTD: vancomycin  1,000 mg Intravenous Q12H   Continuous Infusions:  PRN Meds:.morphine, ondansetron (ZOFRAN) IV, ondansetron, oxyCODONE-acetaminophen, DISCONTD: morphine, DISCONTD: morphine  Assessment/Plan:  Active Problems:  Diabetes mellitus type 1  Cellulitis in diabetic foot  Gangrene of foot  Plan: Continue IV antibiotics.  Appreciate Dr. Lovell Sheehan assistance.  Plans for surgery noted. Blood sugars are doing better today.   LOS: 2 days   Anne [name] 11/01/2011, 12:22 PM

## 2011-11-02 LAB — BASIC METABOLIC PANEL
BUN: 10 mg/dL (ref 6–23)
CO2: 28 mEq/L (ref 19–32)
Calcium: 9.1 mg/dL (ref 8.4–10.5)
Chloride: 100 mEq/L (ref 96–112)
Creatinine, Ser: 0.67 mg/dL (ref 0.50–1.10)
GFR calc Af Amer: >90 mL/min (ref >90)
GFR calc non Af Amer: >90 mL/min (ref >90)
Glucose, Bld: 251 mg/dL — ABNORMAL HIGH (ref 70–99)
Potassium: 3.7 mEq/L (ref 3.5–5.1)
Sodium: 136 mEq/L (ref 135–145)

## 2011-11-02 LAB — TYPE AND SCREEN: Antibody Screen: NEGATIVE

## 2011-11-02 LAB — CBC
MCH: 29.7 pg (ref 26.0–34.0)
MCHC: 34.2 g/dL (ref 30.0–36.0)
RDW: 11.9 % (ref 11.5–15.5)

## 2011-11-02 LAB — GLUCOSE, CAPILLARY: Glucose-Capillary: 242 mg/dL — ABNORMAL HIGH (ref 70–99)

## 2011-11-02 MED ORDER — INSULIN GLARGINE 100 UNIT/ML ~~LOC~~ SOLN
36.0000 [IU] | Freq: Every day | SUBCUTANEOUS | Status: DC
  Administered 2011-11-02 – 2011-11-04 (×3): 36 [IU] via SUBCUTANEOUS
  Filled 2011-11-02: qty 3

## 2011-11-02 MED ORDER — ENOXAPARIN SODIUM 40 MG/0.4ML ~~LOC~~ SOLN: 40.0000 mg | Freq: Once | SUBCUTANEOUS | Status: DC

## 2011-11-02 MED ORDER — ENOXAPARIN SODIUM 40 MG/0.4ML ~~LOC~~ SOLN
40.0000 mg | Freq: Once | SUBCUTANEOUS | Status: AC
  Administered 2011-11-03: 40 mg via SUBCUTANEOUS
  Filled 2011-11-02: qty 0.4

## 2011-11-02 NOTE — Progress Notes (Signed)
Subjective: Having pain in foot, no other complaints.  Objective:  Vital signs in last 24 hours:  Filed Vitals:   11/01/11 0715 11/01/11 1252 11/01/11 2051 11/02/11 0548  BP: 110/73 105/70 125/80 128/84  Pulse: 92 91 89 81  Temp: 99 F (37.2 C) 98.4 F (36.9 C) 98 F (36.7 C) 98.3 F (36.8 C)  TempSrc: Oral  Oral Oral  Resp: 18 18 20 18   Height:      Weight:      SpO2: 95% 98% 98% 93%    Intake/Output from previous day:   Intake/Output Summary (Last 24 hours) at 11/02/11 1300 Last data filed at 11/02/11 0730  Gross per 24 hour  Intake   1640 ml  Output      1 ml  Net   1639 ml    Physical Exam: General: Alert, awake, oriented x3, in no acute distress. HEENT: No bruits, no goiter. Moist mucous membranes, no scleral icterus, no conjunctival pallor. Heart: Regular rate and rhythm, without murmurs, rubs, gallops. Lungs: Clear to auscultation bilaterally. No wheezing, no rhonchi, no rales.  Abdomen: Soft, nontender, nondistended, positive bowel sounds. Extremities: No clubbing cyanosis or edema,  positive pedal pulses.     Lab Results:  Basic Metabolic Panel:    Component Value Date/Time   NA 136 11/02/2011 0446   K 3.7 11/02/2011 0446   CL 100 11/02/2011 0446   CO2 28 11/02/2011 0446   BUN 10 11/02/2011 0446   CREATININE 0.67 11/02/2011 0446   GLUCOSE 251* 11/02/2011 0446   CALCIUM 9.1 11/02/2011 0446   CBC:    Component Value Date/Time   WBC 8.4 11/02/2011 0446   HGB 9.5* 11/02/2011 0446   HCT 27.8* 11/02/2011 0446   PLT 232 11/02/2011 0446   MCV 86.9 11/02/2011 0446   NEUTROABS 11.3* 10/27/2011 2156   LYMPHSABS 2.6 10/27/2011 2156   MONOABS 0.8 10/27/2011 2156   EOSABS 0.1 10/27/2011 2156   BASOSABS 0.0 10/27/2011 2156      Lab 11/02/11 0446 11/01/11 0226 10/31/11 0436 10/30/11 1603 10/27/11 2156  WBC 8.4 12.1* 12.3* 16.8* 14.8*  HGB 9.5* 9.8* 10.0* 11.9* 12.8  HCT 27.8* 28.0* 28.7* 33.9* 36.1  PLT 232 215 214 258 243  MCV 86.9 86.4 86.4 86.7  87.4  MCH 29.7 30.2 30.1 30.4 31.0  MCHC 34.2 35.0 34.8 35.1 35.5  RDW 11.9 11.8 11.8 12.0 12.1  LYMPHSABS -- -- -- -- 2.6  MONOABS -- -- -- -- 0.8  EOSABS -- -- -- -- 0.1  BASOSABS -- -- -- -- 0.0  BANDABS -- -- -- -- --    Lab 11/02/11 0446 11/01/11 0226 10/31/11 0436 10/30/11 1603 10/27/11 2156  NA 136 132* 134* 134* 135  K 3.7 3.8 4.0 3.9 3.8  CL 100 99 99 96 99  CO2 28 26 26 26 24   GLUCOSE 251* 186* 204* 157* 142*  BUN 10 8 9 11 12   CREATININE 0.67 0.67 0.63 0.58 0.59  CALCIUM 9.1 8.8 8.9 10.1 10.0  MG -- -- -- -- --    Lab 10/31/11 0436  INR 1.38  PROTIME --   Cardiac markers: No results found for this basename: CK:3,CKMB:3,TROPONINI:3,MYOGLOBIN:3 in the last 168 hours No components found with this basename: POCBNP:3 Recent Results (from the past 240 hour(s))  CULTURE, BLOOD (ROUTINE X 2)     Status: Normal (Preliminary result)   Collection Time   10/30/11  4:03 PM      Component Value Range Status Comment  Specimen Description BLOOD BLOOD LEFT ARM   Final    Special Requests     Final    Value: BOTTLES DRAWN AEROBIC AND ANAEROBIC 7CC DRAWN BY RN   Culture NO GROWTH 3 DAYS   Final    Report Status PENDING   Incomplete   CULTURE, BLOOD (ROUTINE X 2)     Status: Normal (Preliminary result)   Collection Time   10/30/11  4:13 PM      Component Value Range Status Comment   Specimen Description BLOOD RIGHT ANTECUBITAL   Final    Special Requests BOTTLES DRAWN AEROBIC AND ANAEROBIC 7CC   Final    Culture NO GROWTH 3 DAYS   Final    Report Status PENDING   Incomplete     Studies/Results: No results found.  Medications: Scheduled Meds:   . insulin aspart  0-9 Units Subcutaneous TID WC  . insulin aspart  6 Units Subcutaneous BID  . insulin glargine  36 Units Subcutaneous QHS  . lisinopril  5 mg Oral Daily  . piperacillin-tazobactam (ZOSYN)  IV  3.375 g Intravenous Q8H  . simvastatin  10 mg Oral q1800  . vancomycin  1,000 mg Intravenous Q8H  . DISCONTD: insulin  glargine  30 Units Subcutaneous QHS   Continuous Infusions:  PRN Meds:.morphine, ondansetron (ZOFRAN) IV, ondansetron, oxyCODONE-acetaminophen  Assessment/Plan:  Active Problems:  Diabetes mellitus type 1  Cellulitis in diabetic foot  Gangrene of foot  Plan: Plans for surgery tomorrow per Dr. Lovell Sheehan She is on IV antibiotics and her leukocytosis has resolved Adjusting insulin for diabetes control   LOS: 3 days   Anne [name] 11/02/2011, 1:00 PM

## 2011-11-02 NOTE — Progress Notes (Signed)
UR Chart Review Completed  

## 2011-11-02 NOTE — Consult Note (Signed)
ANTIBIOTIC CONSULT NOTE   Pharmacy Consult for vancomycin and zosyn Indication: wound infection  No Known Allergies  Patient Measurements: Height: 5\' 10"  (177.8 cm) Weight: 202 lb 9.6 oz (91.9 kg) IBW/kg (Calculated) : 68.5   Vital Signs: Temp: 98.3 F (36.8 C) (12/10 0548) Temp src: Oral (12/10 0548) BP: 128/84 mmHg (12/10 0548) Pulse Rate: 81  (12/10 0548) Intake/Output from previous day: 12/09 0701 - 12/10 0700 In: 2120 [P.O.:720; I.V.:900; IV Piggyback:500] Out: -  Intake/Output from this shift:    Labs:  Basename 11/02/11 0446 11/01/11 0226 10/31/11 0436  WBC 8.4 12.1* 12.3*  HGB 9.5* 9.8* 10.0*  PLT 232 215 214  LABCREA -- -- --  CREATININE 0.67 0.67 0.63   Estimated Creatinine Clearance: 125.3 ml/min (by C-G formula based on Cr of 0.67).  Basename 11/01/11 0226  VANCOTROUGH --  VANCOPEAK --  VANCORANDOM <5.0  GENTTROUGH --  GENTPEAK --  GENTRANDOM --  TOBRATROUGH --  TOBRAPEAK --  TOBRARND --  AMIKACINPEAK --  AMIKACINTROU --  AMIKACIN --    Microbiology: Recent Results (from the past 720 hour(s))  CULTURE, BLOOD (ROUTINE X 2)     Status: Normal (Preliminary result)   Collection Time   10/30/11  4:03 PM      Component Value Range Status Comment   Specimen Description BLOOD BLOOD LEFT ARM   Final    Special Requests     Final    Value: BOTTLES DRAWN AEROBIC AND ANAEROBIC 7CC DRAWN BY RN   Culture NO GROWTH 1 DAY   Final    Report Status PENDING   Incomplete   CULTURE, BLOOD (ROUTINE X 2)     Status: Normal (Preliminary result)   Collection Time   10/30/11  4:13 PM      Component Value Range Status Comment   Specimen Description BLOOD RIGHT ANTECUBITAL   Final    Special Requests BOTTLES DRAWN AEROBIC AND ANAEROBIC 7CC   Final    Culture NO GROWTH 1 DAY   Final    Report Status PENDING   Incomplete    Medical History: Past Medical History  Diagnosis Date  . Diabetes mellitus   . Hypertension   . Acid reflux   . Diabetes mellitus type 1  10/31/2011   Medications:  Scheduled:     . insulin aspart  0-9 Units Subcutaneous TID WC  . insulin aspart  6 Units Subcutaneous BID  . insulin glargine  30 Units Subcutaneous QHS  . lisinopril  5 mg Oral Daily  . piperacillin-tazobactam (ZOSYN)  IV  3.375 g Intravenous Q8H  . simvastatin  10 mg Oral q1800  . vancomycin  1,000 mg Intravenous Q8H   Assessment: Good renal fxn  Goal of Therapy:  Vancomycin trough level 10-15 mcg/ml  Plan: Zosyn 3.375gm iv q8hrs Vancomycin 1gm iv q8hrs Re-Check trough tomorrow Labs per protocol  Valrie Hart A 11/02/2011,8:57 AM

## 2011-11-02 NOTE — Progress Notes (Signed)
Inpatient Diabetes Program Recommendations  AACE/ADA: New Consensus Statement on Inpatient Glycemic Control (2009)  Target Ranges:  Prepandial:   less than 140 mg/dL      Peak postprandial:   less than 180 mg/dL (1-2 hours)      Critically ill patients:  140 - 180 mg/dL   Reason for Visit: Elevated fasting glucose: 251 mg/dL  Inpatient Diabetes Program Recommendations Insulin - Basal: Increase Lantus to 36 units qhs

## 2011-11-02 NOTE — Progress Notes (Signed)
  Subjective: No acute change from yesterday  Objective: Vital signs in last 24 hours: Temp:  [98 F (36.7 C)-98.3 F (36.8 C)] 98 F (36.7 C) (12/10 1415) Pulse Rate:  [81-89] 89  (12/10 1415) Resp:  [18-20] 20  (12/10 1415) BP: (125-130)/(80-84) 130/83 mmHg (12/10 1415) SpO2:  [93 %-98 %] 95 % (12/10 1415) Last BM Date: 11/02/11  Intake/Output from previous day: 12/09 0701 - 12/10 0700 In: 2120 [P.O.:720; I.V.:900; IV Piggyback:500] Out: -  Intake/Output this shift: Total I/O In: -  Out: 1 [Stool:1]  General appearance: alert and cooperative Extremities: Necrotic right fifth toe, erythematous right fourth toe. Necrotic soft tissue anteriorly over fourth and fifth metatarsal heads. No significant change in erythema or swelling of the right foot, though there has been no progression.  Lab Results:   [facility] 11/02/11 0446 11/01/11 0226  WBC 8.4 12.1*  HGB 9.5* 9.8*  HCT 27.8* 28.0*  PLT 232 215   BMET  Basename 11/02/11 0446 11/01/11 0226  NA 136 132*  K 3.7 3.8  CL 100 99  CO2 28 26  GLUCOSE 251* 186*  BUN 10 8  CREATININE 0.67 0.67  CALCIUM 9.1 8.8   PT/INR  Basename 10/31/11 0436  LABPROT 17.2*  INR 1.38    Studies/Results: No results found.  Anti-infectives: Anti-infectives     Start     Dose/Rate Route Frequency Ordered Stop   11/01/11 1400   vancomycin (VANCOCIN) IVPB 1000 mg/200 mL premix        1,000 mg 200 mL/hr over 60 Minutes Intravenous Every 8 hours 11/01/11 0756     10/31/11 0400   vancomycin (VANCOCIN) IVPB 1000 mg/200 mL premix  Status:  Discontinued        1,000 mg 200 mL/hr over 60 Minutes Intravenous Every 12 hours 10/30/11 2056 11/01/11 0756   10/30/11 2200  piperacillin-tazobactam (ZOSYN) IVPB 3.375 g       3.375 g 12.5 mL/hr over 240 Minutes Intravenous 3 times per day 10/30/11 2056     10/30/11 1545   vancomycin (VANCOCIN) IVPB 1000 mg/200 mL premix        1,000 mg 200 mL/hr over 60 Minutes Intravenous  Once 10/30/11  1532 10/30/11 1758   10/30/11 1545  piperacillin-tazobactam (ZOSYN) IVPB 3.375 g       3.375 g 12.5 mL/hr over 240 Minutes Intravenous  Once 10/30/11 1532 10/30/11 2022          Assessment/Plan: Impression: Diabetic gangrene, right foot digits Plan: Scheduled for transmetatarsal amputation of right fourth and fifth toes tomorrow. Risks and benefits of the procedure including bleeding, infection, possibility of having further amputations in the future do to her diabetes mellitus were fully explained to the patient, gave informed consent. Preoperative orders have been placed.  LOS: 3 days    Trayce Caravello A 11/02/2011

## 2011-11-03 ENCOUNTER — Encounter (HOSPITAL_COMMUNITY)
Admission: EM | Disposition: A | Discharge status: Home Health | Payer: Self-pay | Source: Home / Self Care | Attending: Internal Medicine

## 2011-11-03 ENCOUNTER — Other Ambulatory Visit: Payer: Self-pay | Admitting: General Surgery

## 2011-11-03 ENCOUNTER — Encounter (HOSPITAL_COMMUNITY): Payer: Self-pay | Admitting: *Deleted

## 2011-11-03 ENCOUNTER — Encounter (HOSPITAL_COMMUNITY): Payer: Self-pay | Admitting: Anesthesiology

## 2011-11-03 ENCOUNTER — Inpatient Hospital Stay (HOSPITAL_COMMUNITY): Payer: Medicaid Other | Admitting: Anesthesiology

## 2011-11-03 HISTORY — PX: AMPUTATION: SHX166

## 2011-11-03 LAB — BASIC METABOLIC PANEL
BUN: 11 mg/dL (ref 6–23)
CO2: 30 mEq/L (ref 19–32)
Chloride: 99 mEq/L (ref 96–112)
Creatinine, Ser: 0.8 mg/dL (ref 0.50–1.10)
GFR calc Af Amer: >90 mL/min (ref >90)
Glucose, Bld: 265 mg/dL — ABNORMAL HIGH (ref 70–99)

## 2011-11-03 LAB — SURGICAL PCR SCREEN
MRSA, PCR: NEGATIVE
Staphylococcus aureus: NEGATIVE

## 2011-11-03 LAB — CBC
HCT: 27.5 % — ABNORMAL LOW (ref 36.0–46.0)
MCV: 87 fL (ref 78.0–100.0)
RDW: 12 % (ref 11.5–15.5)
WBC: 9.1 10*3/uL (ref 4.0–10.5)

## 2011-11-03 LAB — GLUCOSE, CAPILLARY: Glucose-Capillary: 264 mg/dL — ABNORMAL HIGH (ref 70–99)

## 2011-11-03 SURGERY — AMPUTATION, FOOT, RAY
Anesthesia: General | Site: Foot | Laterality: Right | Wound class: Dirty or Infected

## 2011-11-03 MED ORDER — ROCURONIUM BROMIDE 50 MG/5ML IV SOLN
INTRAVENOUS | Status: AC
  Filled 2011-11-03: qty 1

## 2011-11-03 MED ORDER — PROPOFOL 10 MG/ML IV EMUL
INTRAVENOUS | Status: DC | PRN
  Administered 2011-11-03: 150 mg via INTRAVENOUS
  Administered 2011-11-03: 50 mg via INTRAVENOUS

## 2011-11-03 MED ORDER — LACTATED RINGERS IV SOLN
INTRAVENOUS | Status: DC
  Administered 2011-11-03: 12:00:00 via INTRAVENOUS
  Administered 2011-11-03: 1000 mL via INTRAVENOUS

## 2011-11-03 MED ORDER — FENTANYL CITRATE 0.05 MG/ML IJ SOLN
INTRAMUSCULAR | Status: AC
  Filled 2011-11-03: qty 2

## 2011-11-03 MED ORDER — NEOSTIGMINE METHYLSULFATE 1 MG/ML IJ SOLN
INTRAMUSCULAR | Status: DC | PRN
  Administered 2011-11-03: 2 mg via INTRAVENOUS

## 2011-11-03 MED ORDER — PROPOFOL 10 MG/ML IV EMUL
INTRAVENOUS | Status: AC
  Filled 2011-11-03: qty 20

## 2011-11-03 MED ORDER — ONDANSETRON HCL 4 MG/2ML IJ SOLN: 4.0000 mg | Freq: Once | INTRAMUSCULAR | Status: DC | PRN

## 2011-11-03 MED ORDER — ACETAMINOPHEN 10 MG/ML IV SOLN
1000.0000 mg | Freq: Four times a day (QID) | INTRAVENOUS | Status: AC
  Administered 2011-11-03 – 2011-11-04 (×4): 1000 mg via INTRAVENOUS
  Filled 2011-11-03 (×3): qty 100

## 2011-11-03 MED ORDER — MIDAZOLAM HCL 2 MG/2ML IJ SOLN
INTRAMUSCULAR | Status: AC
  Filled 2011-11-03: qty 2

## 2011-11-03 MED ORDER — FENTANYL CITRATE 0.05 MG/ML IJ SOLN
INTRAMUSCULAR | Status: DC | PRN
  Administered 2011-11-03 (×2): 50 ug via INTRAVENOUS

## 2011-11-03 MED ORDER — GLYCOPYRROLATE 0.2 MG/ML IJ SOLN
INTRAMUSCULAR | Status: DC | PRN
  Administered 2011-11-03: .4 mg via INTRAVENOUS

## 2011-11-03 MED ORDER — ACETAMINOPHEN 10 MG/ML IV SOLN
INTRAVENOUS | Status: AC
  Administered 2011-11-03: 1000 mg via INTRAVENOUS
  Filled 2011-11-03: qty 100

## 2011-11-03 MED ORDER — GLYCOPYRROLATE 0.2 MG/ML IJ SOLN
INTRAMUSCULAR | Status: AC
  Filled 2011-11-03: qty 1

## 2011-11-03 MED ORDER — SODIUM CHLORIDE 0.9 % IR SOLN
Status: DC | PRN
  Administered 2011-11-03: 1000 mL

## 2011-11-03 MED ORDER — MIDAZOLAM HCL 2 MG/2ML IJ SOLN
1.0000 mg | INTRAMUSCULAR | Status: DC | PRN
  Administered 2011-11-03: 2 mg via INTRAVENOUS

## 2011-11-03 MED ORDER — ROCURONIUM BROMIDE 100 MG/10ML IV SOLN
INTRAVENOUS | Status: DC | PRN
  Administered 2011-11-03: 30 mg via INTRAVENOUS

## 2011-11-03 MED ORDER — NEOSTIGMINE METHYLSULFATE 1 MG/ML IJ SOLN
INTRAMUSCULAR | Status: AC
  Filled 2011-11-03: qty 10

## 2011-11-03 MED ORDER — FENTANYL CITRATE 0.05 MG/ML IJ SOLN
25.0000 ug | INTRAMUSCULAR | Status: DC | PRN
  Administered 2011-11-03 (×2): 50 ug via INTRAVENOUS

## 2011-11-03 MED ORDER — HYDROMORPHONE HCL PF 1 MG/ML IJ SOLN
1.0000 mg | INTRAMUSCULAR | Status: DC | PRN
  Administered 2011-11-03 – 2011-11-04 (×6): 1 mg via INTRAVENOUS
  Filled 2011-11-03 (×6): qty 1

## 2011-11-03 SURGICAL SUPPLY — 25 items
BAG HAMPER (MISCELLANEOUS) ×1 IMPLANT
BANDAGE ELASTIC 4 VELCRO NS (GAUZE/BANDAGES/DRESSINGS) ×1 IMPLANT
BANDAGE GAUZE ELAST BULKY 4 IN (GAUZE/BANDAGES/DRESSINGS) ×1 IMPLANT
CLOTH BEACON ORANGE TIMEOUT ST (SAFETY) ×1 IMPLANT
COVER LIGHT HANDLE STERIS (MISCELLANEOUS) ×1 IMPLANT
ELECT REM PT RETURN 9FT ADLT (ELECTROSURGICAL) ×2
ELECTRODE REM PT RTRN 9FT ADLT (ELECTROSURGICAL) IMPLANT
GAUZE KERLIX 2  STERILE LF (GAUZE/BANDAGES/DRESSINGS) ×1 IMPLANT
GAUZE XEROFORM 5X9 LF (GAUZE/BANDAGES/DRESSINGS) ×1 IMPLANT
GLOVE BIO SURGEON STRL SZ7.5 (GLOVE) ×1 IMPLANT
GLOVE ECLIPSE 7.0 STRL STRAW (GLOVE) ×1 IMPLANT
GLOVE EXAM NITRILE MD LF STRL (GLOVE) ×1 IMPLANT
GLOVE INDICATOR 7.5 STRL GRN (GLOVE) ×1 IMPLANT
GOWN STRL REIN XL XLG (GOWN DISPOSABLE) ×2 IMPLANT
INST SET MINOR BONE (KITS) ×1 IMPLANT
KIT ROOM TURNOVER APOR (KITS) ×1 IMPLANT
MANIFOLD NEPTUNE II (INSTRUMENTS) ×1 IMPLANT
NS IRRIG 1000ML POUR BTL (IV SOLUTION) ×1 IMPLANT
PACK BASIC LIMB (CUSTOM PROCEDURE TRAY) ×1 IMPLANT
PAD ARMBOARD 7.5X6 YLW CONV (MISCELLANEOUS) ×1 IMPLANT
SET BASIN LINEN APH (SET/KITS/TRAYS/PACK) ×1 IMPLANT
SOL PREP PROV IODINE SCRUB 4OZ (MISCELLANEOUS) ×1 IMPLANT
SPONGE GAUZE 4X4 12PLY (GAUZE/BANDAGES/DRESSINGS) ×1 IMPLANT
SPONGE LAP 18X18 X RAY DECT (DISPOSABLE) ×1 IMPLANT
SUT PROLENE 2 0 FS (SUTURE) ×3 IMPLANT

## 2011-11-03 NOTE — Anesthesia Procedure Notes (Addendum)
Procedure Name: Intubation Date/Time: 11/03/2011 10:51 AM Performed by: Minerva Areola Pre-anesthesia Checklist: Patient identified, Patient being monitored, Timeout performed, Emergency Drugs available and Suction available Patient Re-evaluated:Patient Re-evaluated prior to inductionOxygen Delivery Method: Circle System Utilized Preoxygenation: Pre-oxygenation with 100% oxygen Intubation Type: Rapid sequence and Circoid Pressure applied Ventilation: Mask ventilation without difficulty Laryngoscope Size: 2 and Miller Grade View: Grade II Tube type: Oral Tube size: 7.0 mm Number of attempts: 1 Airway Equipment and Method: stylet Placement Confirmation: ETT inserted through vocal cords under direct vision,  positive ETCO2 and breath sounds checked- equal and bilateral Secured at: 21 cm Tube secured with: Tape Dental Injury: Teeth and Oropharynx as per pre-operative assessment

## 2011-11-03 NOTE — Anesthesia Postprocedure Evaluation (Signed)
Anesthesia Post Note  Patient: Anne [name]  Procedure(s) Performed:  AMPUTATION RAY - Right fourth and fifth metatarsal   Anesthesia type: General  Patient location: PACU  Post pain: Pain level controlled  Post assessment: Post-op Vital signs reviewed, Patient's Cardiovascular Status Stable, Respiratory Function Stable, Patent Airway, No signs of Nausea or vomiting and Pain level controlled  Last Vitals:  Filed Vitals:   11/03/11 1151  BP: 116/67  Pulse: 77  Temp: 36.5 C  Resp: 20    Post vital signs: Reviewed and stable  Level of consciousness: awake and alert   Complications: No apparent anesthesia complications

## 2011-11-03 NOTE — Anesthesia Preprocedure Evaluation (Addendum)
Anesthesia Evaluation  Patient identified by MRN, date of birth, ID band Patient awake    Reviewed: Allergy & Precautions, H&P , NPO status , Patient's Chart, lab work & pertinent test results  History of Anesthesia Complications Negative for: history of anesthetic complications  Airway Mallampati: I      Dental  (+) Teeth Intact   Pulmonary Current Smoker,  clear to auscultation        Cardiovascular hypertension, Pt. on medications Regular Normal    Neuro/Psych    GI/Hepatic GERD-  Medicated and Controlled,  Endo/Other  Diabetes mellitus-, Poorly Controlled, Type 1, Insulin Dependent  Renal/GU      Musculoskeletal   Abdominal   Peds  Hematology   Anesthesia Other Findings   Reproductive/Obstetrics                           Anesthesia Physical Anesthesia Plan  ASA: III  Anesthesia Plan: General   Post-op Pain Management:    Induction: Intravenous, Rapid sequence and Cricoid pressure planned  Airway Management Planned: Oral ETT  Additional Equipment:   Intra-op Plan:   Post-operative Plan:   Informed Consent: I have reviewed the patients History and Physical, chart, labs and discussed the procedure including the risks, benefits and alternatives for the proposed anesthesia with the patient or authorized representative who has indicated his/her understanding and acceptance.     Plan Discussed with:   Anesthesia Plan Comments:         Anesthesia Quick Evaluation

## 2011-11-03 NOTE — Op Note (Signed)
Patient:  Anne [name]  DOB:  10/04/1981  MRN:  [id number]   Preop Diagnosis:  Diabetic gangrene, right foot  Postop Diagnosis:  Same  Procedure:  Transmetatarsal amputation of right fourth and fifth toes  Surgeon:  Franky Macho, M.D.  Anes:  General endotracheal  Indications:  Patient is a 30 year old white female with long-standing diabetes mellitus who tried to trim her and: Toenails proximally 6 weeks ago and developed cellulitis of the fourth and fifth digits. This did progress and resulted in admission to the hospital for gangrene of the distal lateral aspect of the right foot. The fourth and fifth digits are beyond salvage, though she comes the operating room for transmetatarsal amputation of fourth and fifth digits in the right foot. The risks and benefits of the procedure including bleeding, infection, the possibility of having an open wound, and a possibly of needing further amputation of digits in the future due to her diabetes mellitus were fully explained to the patient, gave informed consent.  Procedure note:  Patient is placed the supine position. After induction of general endotracheal anesthesia, the right foot and pretibial regions were prepped and draped using usual sterile technique with Betadine. Surgical site confirmation was performed.  The patient had gangrene of the skin and subcutaneous tissue over the fourth and fifth metatarsal heads anteriorly. This tissue was necrotic and could not be spared. An incision was made between the third and fourth digits and extended around the necrotic tissue in an ovoid fashion up to the plantar aspect of the base of the fourth and fifth digits. The dissection was taken down to the metatarsal bones, proximal to the metatarsal phalangeal joint. Both were divided using bone cutters without difficulty. The soft tissue was noted to be gangrenous in nature. All this tissue was debrided to healthy tissue. Fourth and fifth digits percent to  pathology further examination. The wound was copious irrigated normal saline. Xeroform was placed into the wound. The posterior skin flap was then brought anteriorly to help reapproximate the skin edges, though this was not a complete closure. I suspect that several the stitches will pull through the skin, though hopefully this will make the surface of the open wound last period an Ace wrap and dry sterile dressing were then applied.  All tape and needle counts were correct the end of the procedure. Patient was extubated in the operating room went back to recovery room awake in stable condition.  Complications:  None  EBL:  25 cc  Specimen:  Right fourth and fifth toes

## 2011-11-03 NOTE — Progress Notes (Signed)
Subjective: Patient went for surgery today, op note reviewed, patient is still somnolent  Objective:  Vital signs in last 24 hours:  Filed Vitals:   11/03/11 1250 11/03/11 1315 11/03/11 1430 11/03/11 1457  BP:  110/74 109/71 110/74  Pulse: 80 84 89 84  Temp: 98 F (36.7 C) 97.9 F (36.6 C) 98 F (36.7 C) 98.4 F (36.9 C)  TempSrc:   Oral Oral  Resp: 18 16 18 18   Height:      Weight:      SpO2: 96% 96% 97% 98%    Intake/Output from previous day:   Intake/Output Summary (Last 24 hours) at 11/03/11 1641 Last data filed at 11/03/11 1258  Gross per 24 hour  Intake   1290 ml  Output     25 ml  Net   1265 ml    Physical Exam: General: somnolent HEENT: No bruits, no goiter. Moist mucous membranes, no scleral icterus, no conjunctival pallor. Heart: Regular rate and rhythm, without murmurs, rubs, gallops. Lungs: Clear to auscultation bilaterally. No wheezing, no rhonchi, no rales.  Abdomen: Soft, nontender, nondistended, positive bowel sounds. Extremities: No clubbing cyanosis or edema,  positive pedal pulses. Neuro: Grossly intact, nonfocal.    Lab Results:  Basic Metabolic Panel:    Component Value Date/Time   NA 137 11/03/2011 0509   K 4.2 11/03/2011 0509   CL 99 11/03/2011 0509   CO2 30 11/03/2011 0509   BUN 11 11/03/2011 0509   CREATININE 0.80 11/03/2011 0509   GLUCOSE 265* 11/03/2011 0509   CALCIUM 9.1 11/03/2011 0509   CBC:    Component Value Date/Time   WBC 9.1 11/03/2011 0509   HGB 9.6* 11/03/2011 0509   HCT 27.5* 11/03/2011 0509   PLT 255 11/03/2011 0509   MCV 87.0 11/03/2011 0509   NEUTROABS 11.3* 10/27/2011 2156   LYMPHSABS 2.6 10/27/2011 2156   MONOABS 0.8 10/27/2011 2156   EOSABS 0.1 10/27/2011 2156   BASOSABS 0.0 10/27/2011 2156      Lab 11/03/11 0509 11/02/11 0446 11/01/11 0226 10/31/11 0436 10/30/11 1603 10/27/11 2156  WBC 9.1 8.4 12.1* 12.3* 16.8* --  HGB 9.6* 9.5* 9.8* 10.0* 11.9* --  HCT 27.5* 27.8* 28.0* 28.7* 33.9* --  PLT 255 232  215 214 258 --  MCV 87.0 86.9 86.4 86.4 86.7 --  MCH 30.4 29.7 30.2 30.1 30.4 --  MCHC 34.9 34.2 35.0 34.8 35.1 --  RDW 12.0 11.9 11.8 11.8 12.0 --  LYMPHSABS -- -- -- -- -- 2.6  MONOABS -- -- -- -- -- 0.8  EOSABS -- -- -- -- -- 0.1  BASOSABS -- -- -- -- -- 0.0  BANDABS -- -- -- -- -- --    Lab 11/03/11 0509 11/02/11 0446 11/01/11 0226 10/31/11 0436 10/30/11 1603  NA 137 136 132* 134* 134*  K 4.2 3.7 3.8 4.0 3.9  CL 99 100 99 99 96  CO2 30 28 26 26 26   GLUCOSE 265* 251* 186* 204* 157*  BUN 11 10 8 9 11   CREATININE 0.80 0.67 0.67 0.63 0.58  CALCIUM 9.1 9.1 8.8 8.9 10.1  MG -- -- -- -- --    Lab 11/03/11 0509 10/31/11 0436  INR 1.18 1.38  PROTIME -- --   Cardiac markers: No results found for this basename: CK:3,CKMB:3,TROPONINI:3,MYOGLOBIN:3 in the last 168 hours No components found with this basename: POCBNP:3 Recent Results (from the past 240 hour(s))  CULTURE, BLOOD (ROUTINE X 2)     Status: Normal (Preliminary result)  Collection Time   10/30/11  4:03 PM      Component Value Range Status Comment   Specimen Description BLOOD LEFT ARM DRAWN BY RN   Final    Special Requests BOTTLES DRAWN AEROBIC AND ANAEROBIC 7CC   Final    Culture NO GROWTH 4 DAYS   Final    Report Status PENDING   Incomplete   CULTURE, BLOOD (ROUTINE X 2)     Status: Normal (Preliminary result)   Collection Time   10/30/11  4:13 PM      Component Value Range Status Comment   Specimen Description BLOOD RIGHT ANTECUBITAL   Final    Special Requests BOTTLES DRAWN AEROBIC AND ANAEROBIC 7CC   Final    Culture NO GROWTH 4 DAYS   Final    Report Status PENDING   Incomplete   SURGICAL PCR SCREEN     Status: Normal   Collection Time   11/03/11  8:07 AM      Component Value Range Status Comment   MRSA, PCR NEGATIVE  NEGATIVE  Final    Staphylococcus aureus NEGATIVE  NEGATIVE  Final     Studies/Results: No results found.  Medications: Scheduled Meds:   . acetaminophen  1,000 mg Intravenous Q6H  .  enoxaparin  40 mg Subcutaneous Once  . fentaNYL      . fentaNYL      . glycopyrrolate      . glycopyrrolate      . insulin aspart  0-9 Units Subcutaneous TID WC  . insulin aspart  6 Units Subcutaneous BID  . insulin glargine  36 Units Subcutaneous QHS  . lisinopril  5 mg Oral Daily  . midazolam      . neostigmine      . piperacillin-tazobactam (ZOSYN)  IV  3.375 g Intravenous Q8H  . propofol      . rocuronium      . simvastatin  10 mg Oral q1800  . vancomycin  1,000 mg Intravenous Q8H   Continuous Infusions:   . DISCONTD: lactated ringers     PRN Meds:.HYDROmorphone (DILAUDID) injection, morphine, ondansetron (ZOFRAN) IV, ondansetron, DISCONTD: fentaNYL, DISCONTD: midazolam, DISCONTD: ondansetron (ZOFRAN) IV, DISCONTD: oxyCODONE-acetaminophen, DISCONTD: sodium chloride irrigation  Assessment/Plan:  Active Problems:  Diabetes mellitus type 1  Cellulitis in diabetic foot  Gangrene of foot  Plan:  Patient admitted with gangrenous foot requiring amputation of right 4th and 5th digits.  Dr. Lovell Sheehan is following.  She is currently on vancomycin and zosyn.  Will hopefully be able to change to oral antibiotics soon.  Will plan on discharge home when cleared by surgery.   LOS: 4 days   Romualdo Prosise 11/03/2011, 4:41 PM

## 2011-11-03 NOTE — Transfer of Care (Signed)
Immediate Anesthesia Transfer of Care Note  Patient: Anne [name]  Procedure(s) Performed:  AMPUTATION RAY - Right fourth and fifth metatarsal   Patient Location: PACU  Anesthesia Type: General  Level of Consciousness: awake  Airway & Oxygen Therapy: Patient Spontanous Breathing and non-rebreather face mask  Post-op Assessment: Report given to PACU RN, Post -op Vital signs reviewed and stable and Patient moving all extremities  Post vital signs: Reviewed and stable  Complications: No apparent anesthesia complications

## 2011-11-03 NOTE — Consult Note (Signed)
ANTIBIOTIC CONSULT NOTE   Pharmacy Consult for vancomycin and zosyn Indication: wound infection  No Known Allergies  Patient Measurements: Height: 5\' 10"  (177.8 cm) Weight: 202 lb 9.6 oz (91.9 kg) IBW/kg (Calculated) : 68.5   Vital Signs: Temp: 98.3 F (36.8 C) (12/11 0647) Temp src: Oral (12/11 0647) BP: 106/69 mmHg (12/11 0647) Pulse Rate: 80  (12/11 0647) Intake/Output from previous day: 12/10 0701 - 12/11 0700 In: 240 [P.O.:240] Out: 1 [Stool:1] Intake/Output from this shift:    Labs:  [facility] 11/03/11 0509 11/02/11 0446 11/01/11 0226  WBC 9.1 8.4 12.1*  HGB 9.6* 9.5* 9.8*  PLT 255 232 215  LABCREA -- -- --  CREATININE 0.80 0.67 0.67   Estimated Creatinine Clearance: 125.3 ml/min (by C-G formula based on Cr of 0.8).  Basename 11/03/11 0509 11/01/11 0226  VANCOTROUGH -- --  VANCOPEAK -- --  VANCORANDOM 9.8 <5.0  GENTTROUGH -- --  GENTPEAK -- --  GENTRANDOM -- --  TOBRATROUGH -- --  TOBRAPEAK -- --  TOBRARND -- --  AMIKACINPEAK -- --  AMIKACINTROU -- --  AMIKACIN -- --    Microbiology: Recent Results (from the past 720 hour(s))  CULTURE, BLOOD (ROUTINE X 2)     Status: Normal (Preliminary result)   Collection Time   10/30/11  4:03 PM      Component Value Range Status Comment   Specimen Description BLOOD BLOOD LEFT ARM   Final    Special Requests     Final    Value: BOTTLES DRAWN AEROBIC AND ANAEROBIC 7CC DRAWN BY RN   Culture NO GROWTH 3 DAYS   Final    Report Status PENDING   Incomplete   CULTURE, BLOOD (ROUTINE X 2)     Status: Normal (Preliminary result)   Collection Time   10/30/11  4:13 PM      Component Value Range Status Comment   Specimen Description BLOOD RIGHT ANTECUBITAL   Final    Special Requests BOTTLES DRAWN AEROBIC AND ANAEROBIC 7CC   Final    Culture NO GROWTH 3 DAYS   Final    Report Status PENDING   Incomplete    Medical History: Past Medical History  Diagnosis Date  . Diabetes mellitus   . Hypertension   . Acid reflux     . Diabetes mellitus type 1 10/31/2011   Medications:  Scheduled:     . enoxaparin  40 mg Subcutaneous Once  . insulin aspart  0-9 Units Subcutaneous TID WC  . insulin aspart  6 Units Subcutaneous BID  . insulin glargine  36 Units Subcutaneous QHS  . lisinopril  5 mg Oral Daily  . piperacillin-tazobactam (ZOSYN)  IV  3.375 g Intravenous Q8H  . simvastatin  10 mg Oral q1800  . vancomycin  1,000 mg Intravenous Q8H  . DISCONTD: enoxaparin  40 mg Subcutaneous Once  . DISCONTD: insulin glargine  30 Units Subcutaneous QHS   Assessment: Good renal fxn. Vancomycin trough at target.  Goal of Therapy:  Vancomycin trough level 10-15 mcg/ml  Plan: Zosyn 3.375gm iv q8hrs Vancomycin 1gm iv q8hrs Re-Check trough tomorrow Labs per protocol  Caryl Asp 11/03/2011,8:02 AM

## 2011-11-04 LAB — GLUCOSE, CAPILLARY
Glucose-Capillary: 192 mg/dL — ABNORMAL HIGH (ref 70–99)
Glucose-Capillary: 177 mg/dL — ABNORMAL HIGH (ref 70–99)

## 2011-11-04 LAB — BASIC METABOLIC PANEL
BUN: 7 mg/dL (ref 6–23)
CO2: 30 mEq/L (ref 19–32)
Calcium: 9.1 mg/dL (ref 8.4–10.5)
Creatinine, Ser: 0.67 mg/dL (ref 0.50–1.10)
Glucose, Bld: 144 mg/dL — ABNORMAL HIGH (ref 70–99)

## 2011-11-04 LAB — CULTURE, BLOOD (ROUTINE X 2): Culture: NO GROWTH

## 2011-11-04 LAB — CBC
MCH: 29.9 pg (ref 26.0–34.0)
MCHC: 34.3 g/dL (ref 30.0–36.0)
MCV: 87 fL (ref 78.0–100.0)
Platelets: 279 10*3/uL (ref 150–400)
RDW: 12.1 % (ref 11.5–15.5)
WBC: 9.1 10*3/uL (ref 4.0–10.5)

## 2011-11-04 MED ORDER — ENOXAPARIN SODIUM 40 MG/0.4ML ~~LOC~~ SOLN
40.0000 mg | SUBCUTANEOUS | Status: DC
  Administered 2011-11-04: 40 mg via SUBCUTANEOUS
  Filled 2011-11-04: qty 0.4

## 2011-11-04 MED ORDER — OXYCODONE-ACETAMINOPHEN 5-325 MG PO TABS
1.0000 | ORAL_TABLET | ORAL | Status: DC | PRN
  Administered 2011-11-04 – 2011-11-05 (×4): 2 via ORAL
  Filled 2011-11-04 (×4): qty 2

## 2011-11-04 NOTE — Progress Notes (Signed)
Patient requesting information regarding diabetes care.  RD consult initiated for diet education.  Gave patient blood sugar diary and booklet nutrition/restaurants.  Patient requested information regarding websites for diabetes information.  Gave patient a list of up-to-date websites with pertinent information regarding diabetes.  Patient stated she was very grateful for the information.    Patient goes to [facility] and does not wish to attend other diabetes outpatient classes due to travel and expense.  Patient stated she will follow up with her PCP and nutritionist at the practice.

## 2011-11-04 NOTE — Progress Notes (Signed)
Subjective: Patient seen and examined this am. C/o some pain and itching over the amputation site. No overnight issues.   Objective:  Vital signs in last 24 hours:  Filed Vitals:   11/03/11 1430 11/03/11 1457 11/03/11 2107 11/04/11 0500  BP: 109/71 110/74 117/78 117/77  Pulse: 89 84 97 84  Temp: 98 F (36.7 C) 98.4 F (36.9 C) 99.3 F (37.4 C) 98.1 F (36.7 C)  TempSrc: Oral Oral Oral Oral  Resp: 18 18 18 16   Height:      Weight:      SpO2: 97% 98% 94% 95%    Intake/Output from previous day:   Intake/Output Summary (Last 24 hours) at 11/04/11 1153 Last data filed at 11/03/11 2200  Gross per 24 hour  Intake   1050 ml  Output    400 ml  Net    650 ml    Physical Exam:  General: middle aged female in no acute distress. HEENT: no pallor, no icterus, moist oral mucosa, no JVD, no lymphadenopathy Heart: Normal  s1 &s2  Regular rate and rhythm, without murmurs, rubs, gallops. Lungs: Clear to auscultation bilaterally. Abdomen: Soft, nontender, nondistended, positive bowel sounds. Extremities: dressing over rt foot appears clean and intact. Neuro: Alert, awake, oriented x3, nonfocal.   Lab Results:  Basic Metabolic Panel:    Component Value Date/Time   NA 136 11/04/2011 0513   K 3.9 11/04/2011 0513   CL 98 11/04/2011 0513   CO2 30 11/04/2011 0513   BUN 7 11/04/2011 0513   CREATININE 0.67 11/04/2011 0513   GLUCOSE 144* 11/04/2011 0513   CALCIUM 9.1 11/04/2011 0513   CBC:    Component Value Date/Time   WBC 9.1 11/04/2011 0513   HGB 9.2* 11/04/2011 0513   HCT 26.8* 11/04/2011 0513   PLT 279 11/04/2011 0513   MCV 87.0 11/04/2011 0513   NEUTROABS 11.3* 10/27/2011 2156   LYMPHSABS 2.6 10/27/2011 2156   MONOABS 0.8 10/27/2011 2156   EOSABS 0.1 10/27/2011 2156   BASOSABS 0.0 10/27/2011 2156    Recent Results (from the past 240 hour(s))  CULTURE, BLOOD (ROUTINE X 2)     Status: Normal (Preliminary result)   Collection Time   10/30/11  4:03 PM      Component Value  Range Status Comment   Specimen Description BLOOD LEFT ARM DRAWN BY RN   Final    Special Requests BOTTLES DRAWN AEROBIC AND ANAEROBIC 7CC   Final    Culture NO GROWTH 4 DAYS   Final    Report Status PENDING   Incomplete   CULTURE, BLOOD (ROUTINE X 2)     Status: Normal (Preliminary result)   Collection Time   10/30/11  4:13 PM      Component Value Range Status Comment   Specimen Description BLOOD RIGHT ANTECUBITAL   Final    Special Requests BOTTLES DRAWN AEROBIC AND ANAEROBIC 7CC   Final    Culture NO GROWTH 4 DAYS   Final    Report Status PENDING   Incomplete   SURGICAL PCR SCREEN     Status: Normal   Collection Time   11/03/11  8:07 AM      Component Value Range Status Comment   MRSA, PCR NEGATIVE  NEGATIVE  Final    Staphylococcus aureus NEGATIVE  NEGATIVE  Final     Studies/Results: No results found.  Medications: Scheduled Meds:   . acetaminophen  1,000 mg Intravenous Q6H  . fentaNYL      . fentaNYL      .  glycopyrrolate      . glycopyrrolate      . insulin aspart  0-9 Units Subcutaneous TID WC  . insulin aspart  6 Units Subcutaneous BID  . insulin glargine  36 Units Subcutaneous QHS  . lisinopril  5 mg Oral Daily  . midazolam      . neostigmine      . piperacillin-tazobactam (ZOSYN)  IV  3.375 g Intravenous Q8H  . propofol      . rocuronium      . simvastatin  10 mg Oral q1800  . vancomycin  1,000 mg Intravenous Q8H   Continuous Infusions:   . DISCONTD: lactated ringers     PRN Meds:.morphine, ondansetron (ZOFRAN) IV, ondansetron, oxyCODONE-acetaminophen, DISCONTD: fentaNYL, DISCONTD:  HYDROmorphone (DILAUDID) injection, DISCONTD: midazolam, DISCONTD: ondansetron (ZOFRAN) IV, DISCONTD: oxyCODONE-acetaminophen  Assessment  28 female with type 1 DM presented with cellulitis and gangrene of rt 4th and 5th  toe which was amputated on 12/11.  Plan Cellulitis with gangrene of rt toes , s/p amputation Stable post op. Amputation done on 12/11 Dr Lovell Sheehan  following Cont pain control  cont IV vanco and zosyn for now  PT eval  should be stable to be discharged home with HHPT in 1-2 days and on po antibiotics ( for about 2 weeks per surgery) Wound dressing per surgery recs   DM type 1 Cont lantus, premeal aspart and SSI   HTN  cont lisinopril  HL Cont zocor  DVT prophylaxis : sq lovenox  Diet: diabetic   LOS: 5 days   Anne [name] 11/04/2011, 11:53 AM

## 2011-11-04 NOTE — Addendum Note (Signed)
Addendum  created 11/04/11 1129 by Corena Pilgrim, CRNA   Modules edited:Notes Section

## 2011-11-04 NOTE — Progress Notes (Signed)
Physical Therapy Evaluation Patient Details Name: Anne [name] MRN: [id number] DOB: 07-30-1981 Today's Date: 11/04/2011  Problem List:  Patient Active Problem List  Diagnoses  . Diabetes mellitus type 1  . Cellulitis in diabetic foot  . Gangrene of foot    Past Medical History:  Past Medical History  Diagnosis Date  . Diabetes mellitus   . Hypertension   . Acid reflux   . Diabetes mellitus type 1 10/31/2011   Past Surgical History:  Past Surgical History  Procedure Date  . Appendectomy   . Cesarean section   . Tubal ligation     PT Assessment/Plan/Recommendation PT Assessment Clinical Impression Statement: pt instructed in gait with walker on level ground, weight on Heel only R foot...she has no difficulty with gait...instructed in elevation of R foot as well as stair technique PT Recommendation/Assessment: Patent does not need any further PT services Barriers to Discharge: None No Skilled PT: All education completed;Patient is independent with all acitivity/mobility PT Recommendation Equipment Recommended: Rolling walker with 5" wheels PT Goals     PT Evaluation Precautions/Restrictions  Precautions Required Braces or Orthoses: No Restrictions Weight Bearing Restrictions: Yes RLE Weight Bearing:  (weight bearing on R heel only) Prior Functioning  Home Living Lives With: Spouse Receives Help From: Family Type of Home: Mobile home Home Layout: One level Home Access: Level entry Bathroom Shower/Tub: Engineer, manufacturing systems: Standard Bathroom Accessibility: Yes How Accessible: Accessible via walker Home Adaptive Equipment: Bedside commode/3-in-1 Prior Function Level of Independence: Independent with basic ADLs;Independent with homemaking with ambulation;Independent with gait;Independent with transfers Driving: Yes Vocation: Full time employment Cognition Cognition Arousal/Alertness: Awake/alert Overall Cognitive Status: Appears within  functional limits for tasks assessed Orientation Level: Oriented X4 Sensation/Coordination Sensation Light Touch: Not tested Stereognosis: Not tested Hot/Cold: Not tested Proprioception: Not tested Coordination Gross Motor Movements are Fluid and Coordinated: Yes Fine Motor Movements are Fluid and Coordinated: Yes Extremity Assessment RLE Assessment RLE Assessment: Within Functional Limits LLE Assessment LLE Assessment: Within Functional Limits Mobility (including Balance) Bed Mobility Bed Mobility: Yes Supine to Sit: 7: Independent Sit to Supine - Right: 7: Independent Transfers Transfers: Yes Sit to Stand: 7: Independent Stand to Sit: 7: Independent Stand Pivot Transfers: 7: Independent Ambulation/Gait Ambulation/Gait: Yes Ambulation/Gait Assistance: 6: Modified independent (Device/Increase time) Ambulation Distance (Feet): 60 Feet Assistive device: Rolling walker Gait Pattern: Within Functional Limits (wears post op shoe R-weight on R heel only) Stairs: No (stair technique described to pt-understands teaching) Wheelchair Mobility Wheelchair Mobility: No  Posture/Postural Control Posture/Postural Control: No significant limitations Balance Balance Assessed:  (WNL) Exercise    End of Session PT - End of Session Equipment Utilized During Treatment: Gait belt Activity Tolerance: Patient tolerated treatment well Patient left: in bed;with call bell in reach Nurse Communication: Mobility status for transfers;Mobility status for ambulation General Behavior During Session: [facility] for tasks performed Cognition: [facility] for tasks performed  Konrad Penta 11/04/2011, 1:08 PM

## 2011-11-04 NOTE — Anesthesia Postprocedure Evaluation (Signed)
  Anesthesia Post-op Note  Patient: Anne [name]  Procedure(s) Performed:  AMPUTATION RAY - Right fourth and fifth metatarsal   Patient Location:Room313  Anesthesia Type: General  Level of Consciousness: awake, alert , oriented and patient cooperative  Airway and Oxygen Therapy: Patient Spontanous Breathing  Post-op Pain: mild  Post-op Assessment: Post-op Vital signs reviewed, Patient's Cardiovascular Status Stable, Respiratory Function Stable, Patent Airway, No signs of Nausea or vomiting, Adequate PO intake and Pain level controlled  Post-op Vital Signs: Reviewed and stable  Complications: No apparent anesthesia complications

## 2011-11-04 NOTE — Progress Notes (Signed)
1 Day Post-Op  Subjective: Denies any significant right foot pain.  Objective: Vital signs in last 24 hours: Temp:  [97.7 F (36.5 C)-99.3 F (37.4 C)] 98.1 F (36.7 C) (12/12 0500) Pulse Rate:  [77-97] 84  (12/12 0500) Resp:  [12-20] 16  (12/12 0500) BP: (107-128)/(67-81) 117/77 mmHg (12/12 0500) SpO2:  [93 %-100 %] 95 % (12/12 0500) Last BM Date: 11/03/11  Intake/Output from previous day: 12/11 0701 - 12/12 0700 In: 2050 [I.V.:1050; IV Piggyback:1000] Out: 425 [Urine:400; Blood:25] Intake/Output this shift:    General appearance: alert, cooperative and no distress Extremities: Right foot dressing dry and intact.  Lab Results:   [facility] 11/04/11 0513 11/03/11 0509  WBC 9.1 9.1  HGB 9.2* 9.6*  HCT 26.8* 27.5*  PLT 279 255   BMET  Basename 11/04/11 0513 11/03/11 0509  NA 136 137  K 3.9 4.2  CL 98 99  CO2 30 30  GLUCOSE 144* 265*  BUN 7 11  CREATININE 0.67 0.80  CALCIUM 9.1 9.1   PT/INR  Basename 11/03/11 0509  LABPROT 15.3*  INR 1.18    Studies/Results: No results found.  Anti-infectives: Anti-infectives     Start     Dose/Rate Route Frequency Ordered Stop   11/01/11 1400   vancomycin (VANCOCIN) IVPB 1000 mg/200 mL premix        1,000 mg 200 mL/hr over 60 Minutes Intravenous Every 8 hours 11/01/11 0756     10/31/11 0400   vancomycin (VANCOCIN) IVPB 1000 mg/200 mL premix  Status:  Discontinued        1,000 mg 200 mL/hr over 60 Minutes Intravenous Every 12 hours 10/30/11 2056 11/01/11 0756   10/30/11 2200  piperacillin-tazobactam (ZOSYN) IVPB 3.375 g       3.375 g 12.5 mL/hr over 240 Minutes Intravenous 3 times per day 10/30/11 2056     10/30/11 1545   vancomycin (VANCOCIN) IVPB 1000 mg/200 mL premix        1,000 mg 200 mL/hr over 60 Minutes Intravenous  Once 10/30/11 1532 10/30/11 1758   10/30/11 1545  piperacillin-tazobactam (ZOSYN) IVPB 3.375 g       3.375 g 12.5 mL/hr over 240 Minutes Intravenous  Once 10/30/11 1532 10/30/11 2022            Assessment/Plan: s/p Procedure(s): AMPUTATION RAY Impression: Stable postoperatively Plan: Will get home health nurse consultation as well as physical therapy consultation. Anticipate discharge in next 24-48 hours. Will need to additional weeks of oral antibiotics, probably Bactrim.  LOS: 5 days    [name] A 11/04/2011

## 2011-11-05 LAB — CBC
HCT: 26.6 % — ABNORMAL LOW (ref 36.0–46.0)
MCH: 29.7 pg (ref 26.0–34.0)
MCV: 86.9 fL (ref 78.0–100.0)
Platelets: 278 10*3/uL (ref 150–400)
RDW: 12.1 % (ref 11.5–15.5)

## 2011-11-05 LAB — GLUCOSE, CAPILLARY

## 2011-11-05 MED ORDER — OXYCODONE-ACETAMINOPHEN 5-325 MG PO TABS: 2.0000 | ORAL_TABLET | ORAL | Status: AC | PRN

## 2011-11-05 MED ORDER — SULFAMETHOXAZOLE-TRIMETHOPRIM 800-160 MG PO TABS: 1.0000 | ORAL_TABLET | Freq: Two times a day (BID) | ORAL | Status: AC

## 2011-11-05 MED ORDER — INSULIN GLARGINE 100 UNIT/ML ~~LOC~~ SOLN: 36.0000 [IU] | Freq: Every day | SUBCUTANEOUS | Status: DC

## 2011-11-05 NOTE — Progress Notes (Addendum)
Patient d/c home with family and [facility] following her Dan Humphreys to be brought to floor by [facility], before patient d/c Left floor via wheelchair No c/o pain at d/c Verbalized understanding of d/c instructions, new RX's, and when to follow up with MD Reinforced teaching about follow a diabetic diet , patient and family verbalized understanding Veasna Santibanez, Kae Heller

## 2011-11-05 NOTE — Plan of Care (Signed)
Problem: Discharge Progression Outcomes Goal: Discharge plan in place and appropriate Outcome: Completed/Met Date Met:  11/05/11 Patient going home with [facility] following her

## 2011-11-05 NOTE — Progress Notes (Signed)
2 Days Post-Op  Subjective: No complaints. Minimal incisional pain.  Objective: Vital signs in last 24 hours: Temp:  [97.9 F (36.6 C)-98 F (36.7 C)] 97.9 F (36.6 C) (12/12 2020) Pulse Rate:  [75-86] 75  (12/12 2020) Resp:  [20] 20  (12/12 2020) BP: (109-116)/(73-75) 116/75 mmHg (12/12 2020) SpO2:  [96 %] 96 % (12/12 2020) Last BM Date: 11/03/11  Intake/Output from previous day: 12/12 0701 - 12/13 0700 In: 1970 [P.O.:720; IV Piggyback:1250] Out: -  Intake/Output this shift:    General appearance: alert and cooperative Extremities: Right foot incision healing as well as can be expected with that portion healing by secondary intention. No purulent drainage noted. Significant resolution of swelling and bruising noted along the medial aspect of the right foot. Packing has been removed.  Lab Results:   [facility] 11/05/11 0504 11/04/11 0513  WBC 7.4 9.1  HGB 9.1* 9.2*  HCT 26.6* 26.8*  PLT 278 279   BMET  Basename 11/04/11 0513 11/03/11 0509  NA 136 137  K 3.9 4.2  CL 98 99  CO2 30 30  GLUCOSE 144* 265*  BUN 7 11  CREATININE 0.67 0.80  CALCIUM 9.1 9.1   PT/INR  Basename 11/03/11 0509  LABPROT 15.3*  INR 1.18    Studies/Results: No results found.  Anti-infectives: Anti-infectives     Start     Dose/Rate Route Frequency Ordered Stop   11/01/11 1400   vancomycin (VANCOCIN) IVPB 1000 mg/200 mL premix        1,000 mg 200 mL/hr over 60 Minutes Intravenous Every 8 hours 11/01/11 0756     10/31/11 0400   vancomycin (VANCOCIN) IVPB 1000 mg/200 mL premix  Status:  Discontinued        1,000 mg 200 mL/hr over 60 Minutes Intravenous Every 12 hours 10/30/11 2056 11/01/11 0756   10/30/11 2200  piperacillin-tazobactam (ZOSYN) IVPB 3.375 g       3.375 g 12.5 mL/hr over 240 Minutes Intravenous 3 times per day 10/30/11 2056     10/30/11 1545   vancomycin (VANCOCIN) IVPB 1000 mg/200 mL premix        1,000 mg 200 mL/hr over 60 Minutes Intravenous  Once 10/30/11 1532  10/30/11 1758   10/30/11 1545  piperacillin-tazobactam (ZOSYN) IVPB 3.375 g       3.375 g 12.5 mL/hr over 240 Minutes Intravenous  Once 10/30/11 1532 10/30/11 2022          Assessment/Plan: s/p Procedure(s): AMPUTATION RAY Impression: Has recovered well from surgery. Plan: Patient may be discharged today. Would switch to Bactrim by mouth for 2 weeks. Home health services have been arranged. I will see the patient in 1 week for followup.  LOS: 6 days    Vong Garringer A 11/05/2011

## 2011-11-05 NOTE — Discharge Summary (Addendum)
Patient ID: Anne [name] MRN: [id number] DOB/AGE: Nov 09, 1981 30 y.o.  Admit date: 10/30/2011 Discharge date: 11/05/2011  Primary Care Physician:  [facility] Discharge Diagnoses:    Present on Admission:  . 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 Qty: 28 tablet, Refills: 0      CONTINUE these medications which have CHANGED   Details  insulin glargine (LANTUS) 100 UNIT/ML injection Inject 36 Units into the skin at bedtime. Qty: 10 mL, Refills: 0    oxyCODONE-acetaminophen (PERCOCET) 5-325 MG per tablet Take 2 tablets by mouth every 4 (four) hours as needed for pain. Qty: 30 tablet, Refills: 0      CONTINUE these medications which have NOT CHANGED   Details  insulin aspart (NOVOLOG FLEXPEN) 100 UNIT/ML injection Inject 6-8 Units into the skin 2 (two) times daily. Take 6 units with lunch and 8 units with dinner     lisinopril (PRINIVIL,ZESTRIL) 5 MG tablet Take 5 mg by mouth daily.      metFORMIN (GLUCOPHAGE) 1000 MG tablet Take 1,000 mg by mouth 2 (two) times daily.      naproxen (NAPROSYN) 500 MG tablet Take 500 mg by mouth 2 (two) times daily.      pravastatin (PRAVACHOL) 20 MG tablet Take 20 mg by mouth daily.        STOP taking these medications     acetaminophen (TYLENOL) 500 MG tablet      amoxicillin-clavulanate (AUGMENTIN) 500-125 MG per tablet      clindamycin (CLEOCIN) 300 MG capsule         Disposition and Follow-up:  Follow up with Dr Lovell Sheehan in 1 week  follow up with PCP in 1 week  Consults:  Franky Macho ( surgery)  Significant Diagnostic Studies:  Dg Foot Complete Right  10/30/2011  *RADIOLOGY REPORT*  Clinical Data: Worsening cellulitis right  foot question subcutaneous gas, pain, swelling, redness  RIGHT FOOT COMPLETE - 3+ VIEW  Comparison: 10/27/2011  Findings: Osseous mineralization normal. Joint spaces preserved. Nondisplaced intra-articular fraction at medial aspect, base of proximal phalanx right second toe. No additional fracture or dislocation identified. Multiple foci of soft tissue gas are now identified between the bases of the fourth and fifth toes, extending into the proximal aspects of both toes, compatible with cellulitis/soft tissue infection by a gas forming organism. On the lateral view, unable to exclude cortical destruction at the plantar aspect of the base of the proximal phalanx of the fifth toe, though this could be an artifact.  IMPRESSION: Nondisplaced intra-articular fracture at base of proximal phalanx right second toe. Extensive soft tissue gas between the bases of the fourth and fifth toes compatible with extensive soft tissue infection by a gas forming organism. Unable to exclude bone destruction/osteomyelitis at the base of the proximal phalanx fifth toe. Note that radiographs are unable to exclude septic arthritis. If further imaging is required, consider MR imaging of the right foot with and without contrast.  Original Report Authenticated By: Lollie Marrow, M.D.    Brief H and P: For complete details please refer to admission H and P, but in brief 30 year old female type I insulin-dependent diabetic who has been fighting an infection in her right lower extremity for over  a month now. She she says that's around November 1 she gave herself a pedicure and used a pumice stone on her right foot and then noticed a wound there that looked like it was starting to get infected. She went to her doctor on November 1 was prescribed clindamycin 600 mg twice a day which she took and then went back to her doctor on November 20. The foot had gotten better at this point all of the erythema and swelling had basically resolved but there  was still some mild infection so her physician increased her clindamycin to 600 mg 3 times a day until December 4. Around this time the foot got much worse and since December 2 the foot has progressively gotten more erythematous and her fifth right toe has started to turn black it has become more swollen and painful with purulent discharge. She came to the ED on December 4 was given some IV antibiotics and amoxicillin was admitted to the clindamycin and she was sent home. Despite these to oral antibiotics the foot again has progressively worsened and now she has blackish discoloration to the fourth and fifth toes of the right foot. We're asked to admit the patient for IV antibiotics for failed outpatient treatment of the diabetic foot. Her diabetes is not well controlled she says her normal is around 250 or above. She denies any nausea vomiting diarrhea or any other symptoms. Denies fevers.      Physical Exam on Discharge:  Filed Vitals:   11/03/11 2107 11/04/11 0500 11/04/11 1431 11/04/11 2020  BP: 117/78 117/77 109/73 116/75  Pulse: 97 84 86 75  Temp: 99.3 F (37.4 C) 98.1 F (36.7 C) 98 F (36.7 C) 97.9 F (36.6 C)  TempSrc: Oral Oral Oral   Resp: 18 16 20 20   Height:      Weight:      SpO2: 94% 95% 96% 96%     Intake/Output Summary (Last 24 hours) at 11/05/11 7829 Last data filed at 11/05/11 5621  Gross per 24 hour  Intake   1730 ml  Output      0 ml  Net   1730 ml   General: middle aged female in no acute distress.  HEENT: no pallor, no icterus, moist oral mucosa, no JVD, no lymphadenopathy  Heart: Normal s1 &s2 Regular rate and rhythm, without murmurs, rubs, gallops.  Lungs: Clear to auscultation bilaterally.  Abdomen: Soft, nontender, nondistended, positive bowel sounds.  Extremities: dressing over rt foot appears clean and intact.  Neuro: Alert, awake, oriented x3, nonfocal.    CBC:    Component Value Date/Time   WBC 7.4 11/05/2011 0504   HGB 9.1* 11/05/2011 0504     HCT 26.6* 11/05/2011 0504   PLT 278 11/05/2011 0504   MCV 86.9 11/05/2011 0504   NEUTROABS 11.3* 10/27/2011 2156   LYMPHSABS 2.6 10/27/2011 2156   MONOABS 0.8 10/27/2011 2156   EOSABS 0.1 10/27/2011 2156   BASOSABS 0.0 10/27/2011 2156    Basic Metabolic Panel:    Component Value Date/Time   NA 136 11/04/2011 0513   K 3.9 11/04/2011 0513   CL 98 11/04/2011 0513   CO2 30 11/04/2011 0513   BUN 7 11/04/2011 0513   CREATININE 0.67 11/04/2011 0513   GLUCOSE 144* 11/04/2011 0513   CALCIUM 9.1 11/04/2011 0513    Hospital course: Patient presented with cellulitis and gangrene of rt 4th and 5th toe which was amputated on 12/11 by Dr Lovell Sheehan.  Stable post op.  Continue  pain controlled with percocet Patient started on IV vanco and zosyn and will be discharged home on 2 weeks of po bactrim PT evaluated patient and recommended providing a rolling walker. Home health for dressing to be provided on discharge. i have adjusted her lantus dose ( 36 u daily) and encouraged her  to strictly adhere to her insulin regimen diet control maintain a strict glucose control and adequate follow up with her PCP.  Patient clinically stable to be discharged home.     Time spent on Discharge: 45 minutes  Signed: Eddie North 11/05/2011, 9:53 AM

## 2011-11-05 NOTE — Progress Notes (Signed)
    CARE MANAGEMENT NOTE 11/05/2011  Patient:  Anne [name], Anne [name]   Account Number:  [id number]  Date Initiated:  11/02/2011  Documentation initiated by:  Mendel Corning  Subjective/Objective Assessment:   30 yr old female with grangreen of toes and dm lives at home with 2 children independent of adls for amputation of 2 toes tomorrow.     Action/Plan:   Anticipated DC Date:  11/04/2011   Anticipated DC Plan:  HOME W HOME HEALTH SERVICES      DC Planning Services  CM consult      [facility] Choice  HOME HEALTH  DURABLE MEDICAL EQUIPMENT   Choice offered to / List presented to:  C-1 Patient   DME arranged  Levan Hurst      DME agency  [facility]     [facility] arranged  HH-1 RN      [facility] agency  [facility]   Status of service:   Medicare Important Message given?   (If response is "NO", the following Medicare IM given date fields will be blank) Date Medicare IM given:   Date Additional Medicare IM given:    Discharge Disposition:  HOME W HOME HEALTH SERVICES  Per UR Regulation:    Comments:  11/05/2011 Mendel Corning rn bsn pt d/c home today. above hh and dme arranged. pts mother to stay with her at d/c. Rolling walker to be delivered to pt before d/c

## 2011-11-09 ENCOUNTER — Encounter (HOSPITAL_COMMUNITY): Payer: Self-pay | Admitting: General Surgery

## 2012-01-14 ENCOUNTER — Other Ambulatory Visit (HOSPITAL_COMMUNITY): Payer: Self-pay | Admitting: General Surgery

## 2012-01-14 DIAGNOSIS — M79669 Pain in unspecified lower leg: Secondary | ICD-10-CM

## 2012-01-15 ENCOUNTER — Ambulatory Visit (HOSPITAL_COMMUNITY)
Admission: RE | Admit: 2012-01-15 | Discharge: 2012-01-15 | Disposition: A | Discharge status: Home / Self Care | Payer: Medicaid Other | Source: Ambulatory Visit | Attending: General Surgery | Admitting: General Surgery

## 2012-01-15 DIAGNOSIS — M79609 Pain in unspecified limb: Secondary | ICD-10-CM | POA: Insufficient documentation

## 2012-01-15 DIAGNOSIS — M79669 Pain in unspecified lower leg: Secondary | ICD-10-CM

## 2012-01-28 NOTE — Patient Instructions (Addendum)
20 Anne [name]  01/28/2012   Your procedure is scheduled on:   02/03/2012  Report to [facility] at  700  AM.  Call this number if you have problems the morning of surgery: [phone]   Remember:   Do not eat food:After Midnight.  May have clear liquids:until Midnight .  Clear liquids include soda, tea, black coffee, apple or grape juice, broth.  Take these medicines the morning of surgery with A SIP OF WATER:  Lisinopril, prilosec,percocet   Do not wear jewelry, make-up or nail polish.  Do not wear lotions, powders, or perfumes. You may wear deodorant.  Do not shave 48 hours prior to surgery.  Do not bring valuables to the hospital.  Contacts, dentures or bridgework may not be worn into surgery.  Leave suitcase in the car. After surgery it may be brought to your room.  For patients admitted to the hospital, checkout time is 11:00 AM the day of discharge.   Patients discharged the day of surgery will not be allowed to drive home.  Name and phone number of your driver: family  Special Instructions: CHG Shower Use Special Wash: 1/2 bottle night before surgery and 1/2 bottle morning of surgery.   Please read over the following fact sheets that you were given: Pain Booklet, MRSA Information, Surgical Site Infection Prevention, Anesthesia Post-op Instructions and Care and Recovery After Surgery Toe Injuries and Amputations You have cut off (amputated) part of your toe. Your outcome depends largely on how much was amputated. If just the tip is amputated, often the end of the toe will grow back and the toe may return much to the same as it was before the injury. If more of the toe is missing, your caregiver has done the best with the tissue remaining to allow you to keep as much toe as is possible or has finished the amputation at a level that will leave you with the most functional toe. This means a toe that will work the best for you. Please read the instructions outlined below and refer to this  sheet in the next few weeks. These instructions provide you with general information on caring for yourself. Your caregiver may also give you specific instructions. While your treatment has been done according to the most current medical practices available, unavoidable complications occasionally occur. If you have any problems or questions after discharge, call your caregiver. HOME CARE INSTRUCTIONS   You may resume a normal diet and activities as directed or allowed.   Keep your foot elevated when possible. This helps decrease pain and swelling.   Keep ice packs (a bag of ice wrapped in a towel) on the injured area for 15 to 20 minutes, 3 to 4 times per day, for the first two days. Use ice only if OK with your caregiver.   Change dressings if necessary or as directed.   Clean the wounded area as directed.   Only take over-the-counter or prescription medicines for pain, discomfort, or fever as directed by your caregiver.   Keep appointments as directed.  SEEK IMMEDIATE MEDICAL CARE IF:  There is redness, swelling, numbness or increasing pain in the wound.   There is pus coming from wound.   You have an unexplained oral temperature above 102 F (38.9 C) or as your caregiver suggests.   There is a bad (foul) smell coming from the wound or dressing.   The edges of the wound break open (the edges are not staying together) after  sutures or staples have been removed.  Document Released: 09/30/2005 Document Revised: 10/29/2011 Document Reviewed: 02/27/2009 [facility] Patient Information 2012 [place], [place].PATIENT INSTRUCTIONS POST-ANESTHESIA  IMMEDIATELY FOLLOWING SURGERY:  Do not drive or operate machinery for the first twenty four hours after surgery.  Do not make any important decisions for twenty four hours after surgery or while taking narcotic pain medications or sedatives.  If you develop intractable nausea and vomiting or a severe headache please notify your doctor  immediately.  FOLLOW-UP:  Please make an appointment with your surgeon as instructed. You do not need to follow up with anesthesia unless specifically instructed to do so.  WOUND CARE INSTRUCTIONS (if applicable):  Keep a dry clean dressing on the anesthesia/puncture wound site if there is drainage.  Once the wound has quit draining you may leave it open to air.  Generally you should leave the bandage intact for twenty four hours unless there is drainage.  If the epidural site drains for more than 36-48 hours please call the anesthesia department.  QUESTIONS?:  Please feel free to call your physician or the hospital operator if you have any questions, and they will be happy to assist you.     [facility] Anesthesia Department [address] [place] [phone]

## 2012-01-28 NOTE — H&P (Signed)
Anne Anne [name] is an 31 y.o. female.   Chief Complaint: *Gangrene, right foot, diabetes** HPI: *Patient is Anne [name] 30yo wf s/p partial transmetatarsal amputation of the right 4th and 5th toes 12/12.  Now presents with progression of peripheral vascular disease secondary to diabetes involving the right 2nd and 3rd toes.  Started worsening last week despite antibiotic therapy and home nursing care.**  Past Medical History  Diagnosis Date  . Diabetes mellitus   . Hypertension   . Acid reflux   . Diabetes mellitus type 1 10/31/2011    Past Surgical History  Procedure Date  . Appendectomy   . Cesarean section   . Tubal ligation   . Amputation 11/03/2011    Procedure: AMPUTATION RAY;  Surgeon: Dalia Heading;  Location: [facility];  Service: General;  Laterality: Right;  Right fourth and fifth metatarsal     No family history on file. Social History:  reports that she has been smoking.  She does not have any smokeless tobacco history on file. She reports that she does not drink alcohol. Her drug history not on file.  Allergies: No Known Allergies  No current facility-administered medications on file as of .   Medications Prior to Admission  Medication Sig Dispense Refill  . insulin aspart (NOVOLOG FLEXPEN) 100 UNIT/ML injection Inject 6-8 Units into the skin 2 (two) times daily. Take 6 units with lunch and 8 units with dinner       . insulin glargine (LANTUS) 100 UNIT/ML injection Inject 36 Units into the skin at bedtime.  10 mL  0  . lisinopril (PRINIVIL,ZESTRIL) 5 MG tablet Take 5 mg by mouth daily.        . metFORMIN (GLUCOPHAGE) 1000 MG tablet Take 1,000 mg by mouth 2 (two) times daily.        . naproxen (NAPROSYN) 500 MG tablet Take 500 mg by mouth 2 (two) times daily.        . pravastatin (PRAVACHOL) 20 MG tablet Take 20 mg by mouth daily.          No results found for this or any previous visit (from the past 48 hour(s)). No results found.  Review of Systems  Constitutional:  Negative.   HENT: Negative.   Eyes: Negative.   Respiratory: Negative.   Cardiovascular: Negative.   Gastrointestinal: Negative.   Genitourinary: Negative.   Musculoskeletal: Negative.   Skin: Negative.   Neurological: Negative.   Endo/Heme/Allergies: Negative.     There were no vitals taken for this visit. Physical Exam  Constitutional: She appears well-developed and well-nourished.  HENT:  Head: Normocephalic and atraumatic.  Neck: Normal range of motion. Neck supple.  Cardiovascular: Normal rate, regular rhythm, normal heart sounds and intact distal pulses.   Respiratory: Effort normal and breath sounds normal.  GI: Soft. Bowel sounds are normal.  Musculoskeletal:       Right foot s/p amputation of the 4th and fifth digits with granulation tissue present.  2nd and 3rd toes ischemic and swollen, erythematous.     Assessment/Plan *Imp:  Progressive gangrene, right foot/digits secondary to peripheral vascular disease, diabetes Plan:  Scheduled for transmetatarsal amputation of right foot, 2nd and 3rd toes.  Risks and benefits of procedure were fully explained to the patient, who gives informed consent.**  Anne Anne [name] 01/28/2012, 12:38 PM

## 2012-01-29 ENCOUNTER — Encounter (HOSPITAL_COMMUNITY)
Admission: RE | Admit: 2012-01-29 | Discharge: 2012-01-29 | Disposition: A | Discharge status: Home / Self Care | Payer: Medicaid Other | Source: Ambulatory Visit | Attending: General Surgery | Admitting: General Surgery

## 2012-01-29 ENCOUNTER — Encounter (HOSPITAL_COMMUNITY): Payer: Self-pay | Admitting: Pharmacy Technician

## 2012-01-29 ENCOUNTER — Encounter (HOSPITAL_COMMUNITY): Payer: Self-pay

## 2012-01-29 HISTORY — DX: Pure hypercholesterolemia, unspecified: E78.00

## 2012-01-29 LAB — DIFFERENTIAL
Basophils Relative: 0 % (ref 0–1)
Eosinophils Absolute: 0.2 10*3/uL (ref 0.0–0.7)
Eosinophils Relative: 2 % (ref 0–5)
Monocytes Relative: 6 % (ref 3–12)
Neutrophils Relative %: 58 % (ref 43–77)

## 2012-01-29 LAB — CBC
Hemoglobin: 10.8 g/dL — ABNORMAL LOW (ref 12.0–15.0)
MCH: 29.1 pg (ref 26.0–34.0)
MCHC: 34.7 g/dL (ref 30.0–36.0)
MCV: 83.8 fL (ref 78.0–100.0)

## 2012-01-29 LAB — BASIC METABOLIC PANEL
BUN: 15 mg/dL (ref 6–23)
CO2: 26 mEq/L (ref 19–32)
Calcium: 9.1 mg/dL (ref 8.4–10.5)
GFR calc non Af Amer: >90 mL/min (ref >90)
Glucose, Bld: 174 mg/dL — ABNORMAL HIGH (ref 70–99)
Potassium: 4.1 mEq/L (ref 3.5–5.1)

## 2012-02-03 ENCOUNTER — Encounter (HOSPITAL_COMMUNITY): Payer: Self-pay

## 2012-02-03 ENCOUNTER — Encounter (HOSPITAL_COMMUNITY)
Admission: RE | Disposition: A | Discharge status: Home / Self Care | Payer: Self-pay | Source: Ambulatory Visit | Attending: General Surgery

## 2012-02-03 ENCOUNTER — Ambulatory Visit (HOSPITAL_COMMUNITY)
Admission: RE | Admit: 2012-02-03 | Discharge: 2012-02-03 | Disposition: A | Discharge status: Home / Self Care | Payer: Medicaid Other | Source: Ambulatory Visit | Attending: General Surgery | Admitting: General Surgery

## 2012-02-03 ENCOUNTER — Encounter (HOSPITAL_COMMUNITY): Payer: Self-pay | Admitting: Anesthesiology

## 2012-02-03 DIAGNOSIS — Z01812 Encounter for preprocedural laboratory examination: Secondary | ICD-10-CM | POA: Insufficient documentation

## 2012-02-03 DIAGNOSIS — I96 Gangrene, not elsewhere classified: Secondary | ICD-10-CM | POA: Insufficient documentation

## 2012-02-03 DIAGNOSIS — Z79899 Other long term (current) drug therapy: Secondary | ICD-10-CM | POA: Insufficient documentation

## 2012-02-03 DIAGNOSIS — Z794 Long term (current) use of insulin: Secondary | ICD-10-CM | POA: Insufficient documentation

## 2012-02-03 DIAGNOSIS — I10 Essential (primary) hypertension: Secondary | ICD-10-CM | POA: Insufficient documentation

## 2012-02-03 DIAGNOSIS — Z5320 Procedure and treatment not carried out because of patient's decision for unspecified reasons: Secondary | ICD-10-CM | POA: Insufficient documentation

## 2012-02-03 DIAGNOSIS — E1159 Type 2 diabetes mellitus with other circulatory complications: Secondary | ICD-10-CM | POA: Insufficient documentation

## 2012-02-03 SURGERY — AMPUTATION, FOOT, PARTIAL
Anesthesia: General

## 2012-02-03 MED ORDER — VANCOMYCIN HCL IN DEXTROSE 1-5 GM/200ML-% IV SOLN
INTRAVENOUS | Status: AC
  Filled 2012-02-03: qty 200

## 2012-02-03 MED ORDER — LACTATED RINGERS IV SOLN
INTRAVENOUS | Status: DC
  Administered 2012-02-03: 08:00:00 via INTRAVENOUS

## 2012-02-03 MED ORDER — VANCOMYCIN HCL IN DEXTROSE 1-5 GM/200ML-% IV SOLN: 1000.0000 mg | INTRAVENOUS | Status: DC

## 2012-02-03 MED ORDER — MIDAZOLAM HCL 2 MG/2ML IJ SOLN
INTRAMUSCULAR | Status: AC
  Filled 2012-02-03: qty 2

## 2012-02-03 SURGICAL SUPPLY — 30 items
BAG HAMPER (MISCELLANEOUS) ×1 IMPLANT
BANDAGE ELASTIC 6 VELCRO NS (GAUZE/BANDAGES/DRESSINGS) ×2 IMPLANT
BANDAGE GAUZE ELAST BULKY 4 IN (GAUZE/BANDAGES/DRESSINGS) ×2 IMPLANT
BLADE SAW RECIPROCATING 77.5 (BLADE) ×1 IMPLANT
BLADE SURG SZ20 CARB STEEL (BLADE) ×1 IMPLANT
CLOTH BEACON ORANGE TIMEOUT ST (SAFETY) ×1 IMPLANT
COVER LIGHT HANDLE STERIS (MISCELLANEOUS) ×2 IMPLANT
ELECT REM PT RETURN 9FT ADLT (ELECTROSURGICAL)
ELECTRODE REM PT RTRN 9FT ADLT (ELECTROSURGICAL) ×1 IMPLANT
GLOVE BIO SURGEON STRL SZ7.5 (GLOVE) ×1 IMPLANT
GOWN STRL REIN XL XLG (GOWN DISPOSABLE) ×3 IMPLANT
INST SET MAJOR BONE (KITS) ×1 IMPLANT
KIT ROOM TURNOVER APOR (KITS) ×1 IMPLANT
MANIFOLD NEPTUNE II (INSTRUMENTS) ×1 IMPLANT
NS IRRIG 1000ML POUR BTL (IV SOLUTION) ×1 IMPLANT
PACK BASIC LIMB (CUSTOM PROCEDURE TRAY) ×1 IMPLANT
PAD ABD 5X9 TENDERSORB (GAUZE/BANDAGES/DRESSINGS) ×2 IMPLANT
PAD ARMBOARD 7.5X6 YLW CONV (MISCELLANEOUS) ×1 IMPLANT
SET BASIN LINEN APH (SET/KITS/TRAYS/PACK) ×1 IMPLANT
SOL PREP PROV IODINE SCRUB 4OZ (MISCELLANEOUS) ×1 IMPLANT
SPONGE GAUZE 4X4 12PLY (GAUZE/BANDAGES/DRESSINGS) ×2 IMPLANT
SPONGE LAP 18X18 X RAY DECT (DISPOSABLE) ×1 IMPLANT
STAPLER VISISTAT (STAPLE) ×1 IMPLANT
STAPLER VISISTAT 35W (STAPLE) ×1 IMPLANT
SUT BONE WAX W31G (SUTURE) IMPLANT
SUT ETHILON 3 0 FSL (SUTURE) ×1 IMPLANT
SUT SILK 0 FSL (SUTURE) ×1 IMPLANT
SUT SILK 2 0 SH (SUTURE) IMPLANT
SUT VIC AB 2-0 CT1 27 (SUTURE)
SUT VIC AB 2-0 CT1 TAPERPNT 27 (SUTURE) ×1 IMPLANT

## 2012-02-03 NOTE — OR Nursing (Signed)
Dr Lovell Sheehan in and cancelled surgery,pt hesitant due to "pus pocket" rupturing. He will see patient in office in 2 weeks. She is to call him for any further complications. IV d/c"d with catheter tip intact and no redness or swelling at site. Right foot rewrapped with 4" cling and patient discharged

## 2012-02-03 NOTE — Anesthesia Preprocedure Evaluation (Deleted)
Anesthesia Evaluation  Patient identified by MRN, date of birth, ID band Patient awake    Reviewed: Allergy & Precautions, H&P , NPO status , Patient's Chart, lab work & pertinent test results  History of Anesthesia Complications Negative for: history of anesthetic complications  Airway Mallampati: I      Dental  (+) Teeth Intact   Pulmonary Current Smoker,  breath sounds clear to auscultation        Cardiovascular hypertension, Pt. on medications Rhythm:Regular Rate:Normal     Neuro/Psych    GI/Hepatic GERD-  Medicated and Controlled,  Endo/Other  Diabetes mellitus-, Poorly Controlled, Type 1, Insulin Dependent  Renal/GU      Musculoskeletal   Abdominal   Peds  Hematology   Anesthesia Other Findings   Reproductive/Obstetrics                         Anesthesia Physical Anesthesia Plan  ASA: III  Anesthesia Plan: General   Post-op Pain Management:    Induction: Intravenous, Rapid sequence and Cricoid pressure planned  Airway Management Planned: Oral ETT  Additional Equipment:   Intra-op Plan:   Post-operative Plan: Extubation in OR  Informed Consent: I have reviewed the patients History and Physical, chart, labs and discussed the procedure including the risks, benefits and alternatives for the proposed anesthesia with the patient or authorized representative who has indicated his/her understanding and acceptance.     Plan Discussed with:   Anesthesia Plan Comments: (Case cancelled at 0735 02/03/2012 by Dr. Lovell Sheehan in pre-op. T.Sharae Zappulla CRNA)       Anesthesia Quick Evaluation

## 2012-02-03 NOTE — Interval H&P Note (Signed)
History and Physical Interval Note:  02/03/2012 8:53 AM  Anne [name]  has presented today for surgery, with the diagnosis of Gangrene of foot   The various methods of treatment have been discussed with the patient and family. After consideration of risks, benefits and other options for treatment, the patient has consented to  Procedure(s) (LRB): AMPUTATION FOOT (Right) as a surgical intervention .  The patients' history has been reviewed, patient examined, no change in status, stable for surgery.  I have reviewed the patients' chart and labs.  Questions were answered to the patient's satisfaction.     [name] A  Surgery cancelled as the right second and third toe look somewhat less ischemic.  Patient would like to delay surgery for now.

## 2012-02-05 ENCOUNTER — Ambulatory Visit: Payer: Medicaid Other | Admitting: Family Medicine

## 2012-02-10 ENCOUNTER — Encounter: Payer: Self-pay | Admitting: Family Medicine

## 2012-02-10 ENCOUNTER — Ambulatory Visit (INDEPENDENT_AMBULATORY_CARE_PROVIDER_SITE_OTHER): Payer: Medicaid Other | Admitting: Family Medicine

## 2012-02-10 VITALS — BP 140/90 | HR 91 | Resp 16 | Ht 70.0 in | Wt 204.1 lb

## 2012-02-10 DIAGNOSIS — M62838 Other muscle spasm: Secondary | ICD-10-CM

## 2012-02-10 DIAGNOSIS — E669 Obesity, unspecified: Secondary | ICD-10-CM

## 2012-02-10 DIAGNOSIS — E11628 Type 2 diabetes mellitus with other skin complications: Secondary | ICD-10-CM

## 2012-02-10 DIAGNOSIS — E1149 Type 2 diabetes mellitus with other diabetic neurological complication: Secondary | ICD-10-CM

## 2012-02-10 DIAGNOSIS — E785 Hyperlipidemia, unspecified: Secondary | ICD-10-CM

## 2012-02-10 DIAGNOSIS — E119 Type 2 diabetes mellitus without complications: Secondary | ICD-10-CM

## 2012-02-10 DIAGNOSIS — E1142 Type 2 diabetes mellitus with diabetic polyneuropathy: Secondary | ICD-10-CM

## 2012-02-10 DIAGNOSIS — M549 Dorsalgia, unspecified: Secondary | ICD-10-CM

## 2012-02-10 DIAGNOSIS — E1140 Type 2 diabetes mellitus with diabetic neuropathy, unspecified: Secondary | ICD-10-CM

## 2012-02-10 DIAGNOSIS — L03119 Cellulitis of unspecified part of limb: Secondary | ICD-10-CM

## 2012-02-10 DIAGNOSIS — E1169 Type 2 diabetes mellitus with other specified complication: Secondary | ICD-10-CM

## 2012-02-10 DIAGNOSIS — L02619 Cutaneous abscess of unspecified foot: Secondary | ICD-10-CM

## 2012-02-10 MED ORDER — CYCLOBENZAPRINE HCL 5 MG PO TABS: 5.0000 mg | ORAL_TABLET | Freq: Every evening | ORAL | Status: DC | PRN

## 2012-02-10 NOTE — Patient Instructions (Signed)
Get the blood drawn today, we will call with results and instructions for changing your medication Start the muscle relaxant at bedtime for the cramping Continue your current medications I will get your records from your previous doctor  F/U in 4 weeks

## 2012-02-11 DIAGNOSIS — E785 Hyperlipidemia, unspecified: Secondary | ICD-10-CM | POA: Insufficient documentation

## 2012-02-11 DIAGNOSIS — E1169 Type 2 diabetes mellitus with other specified complication: Secondary | ICD-10-CM | POA: Insufficient documentation

## 2012-02-11 DIAGNOSIS — M62838 Other muscle spasm: Secondary | ICD-10-CM | POA: Insufficient documentation

## 2012-02-11 DIAGNOSIS — M549 Dorsalgia, unspecified: Secondary | ICD-10-CM | POA: Insufficient documentation

## 2012-02-11 DIAGNOSIS — E1159 Type 2 diabetes mellitus with other circulatory complications: Secondary | ICD-10-CM | POA: Insufficient documentation

## 2012-02-11 DIAGNOSIS — E669 Obesity, unspecified: Secondary | ICD-10-CM | POA: Insufficient documentation

## 2012-02-11 DIAGNOSIS — E1140 Type 2 diabetes mellitus with diabetic neuropathy, unspecified: Secondary | ICD-10-CM | POA: Insufficient documentation

## 2012-02-11 NOTE — Assessment & Plan Note (Signed)
Trial of flexeril

## 2012-02-11 NOTE — Assessment & Plan Note (Signed)
Continue antibiotics, defer to surgery

## 2012-02-11 NOTE — Assessment & Plan Note (Signed)
Obtain records before treatment

## 2012-02-11 NOTE — Assessment & Plan Note (Signed)
A1C will be obtained, continue current meds, She may need more to cover her in the setting of her acute infection

## 2012-02-11 NOTE — Progress Notes (Signed)
  Subjective:    Patient ID: Anne [name], female    DOB: 1981-10-19, 31 y.o.   MRN: [id number]  HPI  Pt here to establish care, previous PCP [facility]- Dr. Jorene Guest  DM- pt has had DM type 2 since the age of 75, she has never took her diabetes very seriously until recently. She is unaware of what her last A1C was, her blood sugars have been 170-200 fasting She is currently on antibiotics secondary to foot infection. Taking Lantus 36 units and Novolog 6 with lunch and 8 with dinner  Back pain- has history of herniated disc, was on neurontin in the past and pain medications, asking to be restated on this if possible. She gets lots of spasms in her legs and back which wake her during the night.   Hyperlipidemia- currently on pravastatin  Toe amputation- s/p 4th and 5th digit ampuation secondary to gangrene from uncontrolled DM, currently has infection on 2nd and 3rd digits, her surgeon is Dr. Lovell Sheehan, he is waiting to see if antibiotics will help heal this if not will proceed with amputation  Has [facility] nurse Due for PAP Smear Review of Systems   GEN- denies fatigue, fever, weight loss,weakness, recent illness HEENT- denies eye drainage, change in vision, nasal discharge, CVS- denies chest pain, palpitations RESP- denies SOB, cough, wheeze ABD- denies N/V, change in stools, abd pain GU- denies dysuria, hematuria, dribbling, incontinence MSK- +joint pain, muscle aches, injury Neuro- denies headache, dizziness, syncope, seizure activity, +numbness and tingling feet       Objective:   Physical Exam GEN- NAD, alert and oriented x3, obese HEENT- PERRL, EOMI, non injected sclera, pink conjunctiva, MMM, oropharynx clear Neck- Supple, no thyromegaly CVS- RRR, no murmur RESP-CTAB ABD- NABS,soft, NT,ND EXT- +pedal edema Foot- Right foot- wrapped and in  Boot, left foot 4th toe has small ulceration at DIP region, no erythema, no pus, decreased sensation in feet Pulses-  Radial, DP- 2+        Assessment & Plan:

## 2012-02-11 NOTE — Assessment & Plan Note (Signed)
Will plan to start neurontin for both back and neuropathy s/p her surgery if this is going to be done on March 28th

## 2012-02-25 ENCOUNTER — Telehealth: Payer: Self-pay | Admitting: Family Medicine

## 2012-03-04 NOTE — Telephone Encounter (Signed)
Called patient and left message for them to return call at the office   

## 2012-03-10 ENCOUNTER — Ambulatory Visit (INDEPENDENT_AMBULATORY_CARE_PROVIDER_SITE_OTHER): Payer: Medicaid Other | Admitting: Family Medicine

## 2012-03-10 ENCOUNTER — Encounter: Payer: Self-pay | Admitting: Family Medicine

## 2012-03-10 VITALS — BP 138/90 | HR 103 | Resp 16 | Ht 70.0 in | Wt 202.1 lb

## 2012-03-10 DIAGNOSIS — M549 Dorsalgia, unspecified: Secondary | ICD-10-CM

## 2012-03-10 DIAGNOSIS — L03119 Cellulitis of unspecified part of limb: Secondary | ICD-10-CM

## 2012-03-10 DIAGNOSIS — L97509 Non-pressure chronic ulcer of other part of unspecified foot with unspecified severity: Secondary | ICD-10-CM

## 2012-03-10 DIAGNOSIS — E119 Type 2 diabetes mellitus without complications: Secondary | ICD-10-CM

## 2012-03-10 DIAGNOSIS — I10 Essential (primary) hypertension: Secondary | ICD-10-CM

## 2012-03-10 DIAGNOSIS — E11628 Type 2 diabetes mellitus with other skin complications: Secondary | ICD-10-CM

## 2012-03-10 DIAGNOSIS — L02619 Cutaneous abscess of unspecified foot: Secondary | ICD-10-CM

## 2012-03-10 DIAGNOSIS — E1169 Type 2 diabetes mellitus with other specified complication: Secondary | ICD-10-CM

## 2012-03-10 MED ORDER — PRAVASTATIN SODIUM 20 MG PO TABS: 20.0000 mg | ORAL_TABLET | Freq: Every morning | ORAL | Status: DC

## 2012-03-10 MED ORDER — INSULIN ASPART 100 UNIT/ML ~~LOC~~ SOLN: 6.0000 [IU] | Freq: Two times a day (BID) | SUBCUTANEOUS | Status: DC

## 2012-03-10 MED ORDER — LISINOPRIL 10 MG PO TABS: 5.0000 mg | ORAL_TABLET | Freq: Every morning | ORAL | Status: DC

## 2012-03-10 MED ORDER — INSULIN GLARGINE 100 UNIT/ML ~~LOC~~ SOLN: 38.0000 [IU] | Freq: Every day | SUBCUTANEOUS | Status: DC

## 2012-03-10 MED ORDER — OMEPRAZOLE 20 MG PO CPDR: 20.0000 mg | DELAYED_RELEASE_CAPSULE | Freq: Every morning | ORAL | Status: DC

## 2012-03-10 MED ORDER — METFORMIN HCL 1000 MG PO TABS: 1000.0000 mg | ORAL_TABLET | Freq: Two times a day (BID) | ORAL | Status: DC

## 2012-03-10 MED ORDER — AMOXICILLIN-POT CLAVULANATE 875-125 MG PO TABS: 1.0000 | ORAL_TABLET | Freq: Two times a day (BID) | ORAL | Status: AC

## 2012-03-10 MED ORDER — GABAPENTIN 300 MG PO CAPS: 300.0000 mg | ORAL_CAPSULE | Freq: Every day | ORAL | Status: DC

## 2012-03-10 NOTE — Patient Instructions (Addendum)
Start the antibiotics. Get the x-ray of your foot next week. Lisinopril has been increased to 10 mg Neurontin at bedtime Continue all other medications Podiatry referral Followup one week for wound check

## 2012-03-10 NOTE — Progress Notes (Signed)
  Subjective:    Patient ID: Anne [name], female    DOB: [birth year], 31 y.o.   MRN: [id number]  HPI  Patient here to followup diabetes and foot  Seen by Dr. Lovell Sheehan for her right foot which was being treated with antibiotics to avoid amputation. At this time he is no longer considering amputation of her other digits. She continues to have ulceration on her left foot fourth toe. It has become more red and swollen. She states Dr. Lauralyn Primes did not evaluate this today. DM-  Her blood sugars fasting have been 140 to 150s. She's currently using Lantus 38 units. Labs reviewed with patient. History of chronic low back pain. She was on Neurontin per previous visit. She has sciatic nerve problems. She's currently on pain medication per her surgeon but occasionally gets radiating symptoms down the right side.   Review of Systems   GEN- denies fatigue, fever, weight loss,weakness, recent illness HEENT- denies eye drainage, change in vision, nasal discharge, CVS- denies chest pain, palpitations RESP- denies SOB, cough, wheeze ABD- denies N/V, change in stools, abd pain GU- denies dysuria, hematuria, dribbling, incontinence MSK- + joint pain, muscle aches, injury        Objective:   Physical Exam GEN- NAD, alert and oriented x3, obese CVS- RRR, no murmur RESP-CTAB ABD- NABS,soft, NT,ND Back- TTP lumbar region, neg SLR,  EXT- +pedal edema Foot- Right foot-healing wounds, no drainage non tender, left foot 4th toe has small ulceration at DIP region, + erythema,+swelling,  small amount of clear drainage, decreased sensation in feet Pulses- Radial, DP- 2+         Assessment & Plan:

## 2012-03-11 ENCOUNTER — Encounter: Payer: Self-pay | Admitting: Family Medicine

## 2012-03-11 DIAGNOSIS — L97509 Non-pressure chronic ulcer of other part of unspecified foot with unspecified severity: Secondary | ICD-10-CM | POA: Insufficient documentation

## 2012-03-11 DIAGNOSIS — I10 Essential (primary) hypertension: Secondary | ICD-10-CM | POA: Insufficient documentation

## 2012-03-11 NOTE — Assessment & Plan Note (Signed)
Ulceration noted with drainage, will obtain xray of foot for osteomyelitis, start augmentin. She may need to be referred to wound center for this or back to surgeon, f/u in 1 week

## 2012-03-11 NOTE — Assessment & Plan Note (Signed)
A1C at goal, will continue current dose of Lantus and short acting insulin

## 2012-03-11 NOTE — Assessment & Plan Note (Signed)
Per above cellulitic changes surrounding ulcer on toe Right foot much improved

## 2012-03-11 NOTE — Assessment & Plan Note (Signed)
Restart neurontin at bedtime, continue flexeril which helps, pain meds by surgeon

## 2012-03-11 NOTE — Assessment & Plan Note (Signed)
Increase lisinopril to 10mg ,

## 2012-03-17 ENCOUNTER — Ambulatory Visit (INDEPENDENT_AMBULATORY_CARE_PROVIDER_SITE_OTHER): Payer: Medicaid Other | Admitting: Family Medicine

## 2012-03-17 ENCOUNTER — Encounter: Payer: Self-pay | Admitting: Family Medicine

## 2012-03-17 ENCOUNTER — Ambulatory Visit (HOSPITAL_COMMUNITY)
Admission: RE | Admit: 2012-03-17 | Discharge: 2012-03-17 | Disposition: A | Discharge status: Home / Self Care | Payer: Medicaid Other | Source: Ambulatory Visit | Attending: Family Medicine | Admitting: Family Medicine

## 2012-03-17 VITALS — BP 122/78 | HR 88 | Resp 18 | Ht 70.0 in | Wt 202.1 lb

## 2012-03-17 DIAGNOSIS — L97509 Non-pressure chronic ulcer of other part of unspecified foot with unspecified severity: Secondary | ICD-10-CM

## 2012-03-17 DIAGNOSIS — E1149 Type 2 diabetes mellitus with other diabetic neurological complication: Secondary | ICD-10-CM

## 2012-03-17 DIAGNOSIS — E1169 Type 2 diabetes mellitus with other specified complication: Secondary | ICD-10-CM | POA: Insufficient documentation

## 2012-03-17 DIAGNOSIS — I10 Essential (primary) hypertension: Secondary | ICD-10-CM

## 2012-03-17 DIAGNOSIS — E1140 Type 2 diabetes mellitus with diabetic neuropathy, unspecified: Secondary | ICD-10-CM

## 2012-03-17 DIAGNOSIS — E1142 Type 2 diabetes mellitus with diabetic polyneuropathy: Secondary | ICD-10-CM

## 2012-03-17 MED ORDER — OXYCODONE-ACETAMINOPHEN 7.5-325 MG PO TABS: 1.0000 | ORAL_TABLET | Freq: Four times a day (QID) | ORAL | Status: DC | PRN

## 2012-03-17 NOTE — Patient Instructions (Signed)
For your foot, I will refer you to podiatry for the toe Pain meds refilled today Complete antibiotics Continue current dose blood pressure medication F/U 4 weeks

## 2012-03-18 NOTE — Progress Notes (Signed)
  Subjective:    Patient ID: Anne [name], female    DOB: Jul 21, 1981, 31 y.o.   MRN: [id number]  HPI Patient here to followup left toe infection. She thinks looks much better. X-ray was obtained which did not show osteomyelitis but soft tissue swelling. She's not had any drainage from the lesion.  +phatom pain in right foot  Review of Systems  - per above   No fever, no chills, +pain in toe and back     Objective:   Physical Exam GEN-NAD, alert and oriented x 3, obease EXT- +pedal edema Foot- Well healed foot s/p amputation ,left foot 4th toe ulceration now with scabbing, no drainage at DIP region,  no pus,+swelling, no fluctuance, + erythema Sole Right foot- small scabs, no pus, no draiange Pulses- Radial, DP- 2+       Assessment & Plan:

## 2012-03-18 NOTE — Assessment & Plan Note (Signed)
Establish with podiatry

## 2012-03-18 NOTE — Assessment & Plan Note (Signed)
Wound site looks much improved today. I will continue her on a course of Augmentin. X-ray does not reveal any bony changes at this point. Since she is improving I will hold off on further imaging. I will refer her to a foot doctor she will need to have maintenance care she also gets small scabs and ulcerations on the sole of her right foot

## 2012-03-18 NOTE — Assessment & Plan Note (Signed)
Improved, continue ACEI

## 2012-04-21 ENCOUNTER — Ambulatory Visit: Payer: Medicaid Other | Admitting: Family Medicine

## 2012-04-21 ENCOUNTER — Telehealth: Payer: Self-pay | Admitting: Family Medicine

## 2012-04-22 ENCOUNTER — Other Ambulatory Visit: Payer: Self-pay

## 2012-04-22 MED ORDER — LISINOPRIL 10 MG PO TABS: 5.0000 mg | ORAL_TABLET | Freq: Every morning | ORAL | Status: DC

## 2012-04-22 NOTE — Telephone Encounter (Signed)
Med sent.

## 2012-04-27 ENCOUNTER — Emergency Department (HOSPITAL_COMMUNITY)
Admission: EM | Admit: 2012-04-27 | Discharge: 2012-04-27 | Disposition: A | Discharge status: Home / Self Care | Payer: Medicaid Other | Attending: Emergency Medicine | Admitting: Emergency Medicine

## 2012-04-27 ENCOUNTER — Encounter (HOSPITAL_COMMUNITY): Payer: Self-pay | Admitting: Emergency Medicine

## 2012-04-27 DIAGNOSIS — T2016XA Burn of first degree of forehead and cheek, initial encounter: Secondary | ICD-10-CM

## 2012-04-27 DIAGNOSIS — Z23 Encounter for immunization: Secondary | ICD-10-CM | POA: Insufficient documentation

## 2012-04-27 DIAGNOSIS — T20111A Burn of first degree of right ear [any part, except ear drum], initial encounter: Secondary | ICD-10-CM

## 2012-04-27 DIAGNOSIS — E109 Type 1 diabetes mellitus without complications: Secondary | ICD-10-CM | POA: Insufficient documentation

## 2012-04-27 DIAGNOSIS — Y93G2 Activity, grilling and smoking food: Secondary | ICD-10-CM | POA: Insufficient documentation

## 2012-04-27 DIAGNOSIS — I10 Essential (primary) hypertension: Secondary | ICD-10-CM | POA: Insufficient documentation

## 2012-04-27 DIAGNOSIS — T22139A Burn of first degree of unspecified upper arm, initial encounter: Secondary | ICD-10-CM | POA: Insufficient documentation

## 2012-04-27 DIAGNOSIS — X038XXA Other exposure to controlled fire, not in building or structure, initial encounter: Secondary | ICD-10-CM | POA: Insufficient documentation

## 2012-04-27 DIAGNOSIS — F17200 Nicotine dependence, unspecified, uncomplicated: Secondary | ICD-10-CM | POA: Insufficient documentation

## 2012-04-27 DIAGNOSIS — T2019XA Burn of first degree of multiple sites of head, face, and neck, initial encounter: Secondary | ICD-10-CM | POA: Insufficient documentation

## 2012-04-27 DIAGNOSIS — T22131A Burn of first degree of right upper arm, initial encounter: Secondary | ICD-10-CM

## 2012-04-27 DIAGNOSIS — E7800 Pure hypercholesterolemia, unspecified: Secondary | ICD-10-CM | POA: Insufficient documentation

## 2012-04-27 DIAGNOSIS — Z794 Long term (current) use of insulin: Secondary | ICD-10-CM | POA: Insufficient documentation

## 2012-04-27 DIAGNOSIS — T2017XA Burn of first degree of neck, initial encounter: Secondary | ICD-10-CM

## 2012-04-27 LAB — GLUCOSE, CAPILLARY: Glucose-Capillary: 203 mg/dL — ABNORMAL HIGH (ref 70–99)

## 2012-04-27 MED ORDER — SILVER SULFADIAZINE 1 % EX CREA: TOPICAL_CREAM | Freq: Every day | CUTANEOUS | Status: DC

## 2012-04-27 MED ORDER — ONDANSETRON HCL 4 MG/2ML IJ SOLN
4.0000 mg | Freq: Once | INTRAMUSCULAR | Status: AC
  Administered 2012-04-27: 4 mg via INTRAVENOUS
  Filled 2012-04-27: qty 2

## 2012-04-27 MED ORDER — BACITRACIN ZINC 500 UNIT/GM EX OINT: TOPICAL_OINTMENT | Freq: Two times a day (BID) | CUTANEOUS | Status: AC

## 2012-04-27 MED ORDER — OXYCODONE-ACETAMINOPHEN 5-325 MG PO TABS: 1.0000 | ORAL_TABLET | ORAL | Status: DC | PRN

## 2012-04-27 MED ORDER — BACITRACIN ZINC 500 UNIT/GM EX OINT
TOPICAL_OINTMENT | Freq: Once | CUTANEOUS | Status: AC
  Administered 2012-04-27: 1 via TOPICAL
  Filled 2012-04-27: qty 0.9

## 2012-04-27 MED ORDER — BACITRACIN ZINC 500 UNIT/GM EX OINT: TOPICAL_OINTMENT | Freq: Two times a day (BID) | CUTANEOUS | Status: DC

## 2012-04-27 MED ORDER — SILVER SULFADIAZINE 1 % EX CREA
TOPICAL_CREAM | Freq: Once | CUTANEOUS | Status: AC
  Administered 2012-04-27: 20:00:00 via TOPICAL
  Filled 2012-04-27: qty 50

## 2012-04-27 MED ORDER — TETANUS-DIPHTH-ACELL PERTUSSIS 5-2.5-18.5 LF-MCG/0.5 IM SUSP
0.5000 mL | Freq: Once | INTRAMUSCULAR | Status: AC
  Administered 2012-04-27: 0.5 mL via INTRAMUSCULAR
  Filled 2012-04-27 (×2): qty 0.5

## 2012-04-27 MED ORDER — HYDROMORPHONE HCL PF 2 MG/ML IJ SOLN
2.0000 mg | Freq: Once | INTRAMUSCULAR | Status: AC
  Administered 2012-04-27: 2 mg via INTRAVENOUS
  Filled 2012-04-27: qty 1

## 2012-04-27 MED ORDER — SODIUM CHLORIDE 0.9 % IV SOLN
Freq: Once | INTRAVENOUS | Status: AC
  Administered 2012-04-27: 19:00:00 via INTRAVENOUS
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
## 2012-04-28 ENCOUNTER — Ambulatory Visit (INDEPENDENT_AMBULATORY_CARE_PROVIDER_SITE_OTHER): Payer: Medicaid Other | Admitting: Family Medicine

## 2012-04-28 ENCOUNTER — Encounter: Payer: Self-pay | Admitting: Family Medicine

## 2012-04-28 VITALS — BP 132/76 | HR 93 | Resp 18 | Ht 70.0 in | Wt 203.1 lb

## 2012-04-28 DIAGNOSIS — L03119 Cellulitis of unspecified part of limb: Secondary | ICD-10-CM

## 2012-04-28 DIAGNOSIS — T22299A Burn of second degree of multiple sites of unspecified shoulder and upper limb, except wrist and hand, initial encounter: Secondary | ICD-10-CM

## 2012-04-28 DIAGNOSIS — E11628 Type 2 diabetes mellitus with other skin complications: Secondary | ICD-10-CM

## 2012-04-28 DIAGNOSIS — E1169 Type 2 diabetes mellitus with other specified complication: Secondary | ICD-10-CM

## 2012-04-28 DIAGNOSIS — L02619 Cutaneous abscess of unspecified foot: Secondary | ICD-10-CM

## 2012-04-28 DIAGNOSIS — L97509 Non-pressure chronic ulcer of other part of unspecified foot with unspecified severity: Secondary | ICD-10-CM

## 2012-04-28 DIAGNOSIS — E119 Type 2 diabetes mellitus without complications: Secondary | ICD-10-CM

## 2012-04-28 MED ORDER — OXYCODONE-ACETAMINOPHEN 7.5-325 MG PO TABS: 1.0000 | ORAL_TABLET | Freq: Four times a day (QID) | ORAL | Status: DC | PRN

## 2012-04-28 MED ORDER — AMOXICILLIN-POT CLAVULANATE 875-125 MG PO TABS: 1.0000 | ORAL_TABLET | Freq: Two times a day (BID) | ORAL | Status: AC
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
## 2012-04-28 NOTE — Assessment & Plan Note (Signed)
She has some mild cellulitic changes in her right foot which was where the previous amputation was. Some of the tissue also looks a little waist. We'll place zero bandage in all and she will restart Augmentin for this. If she noticed straining she will call her surgeon. We'll recheck this in 3 weeks
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
## 2012-04-28 NOTE — Assessment & Plan Note (Signed)
Previous ulceration is healed. She still has some discoloration to the toe but is able to a [place] and does not have any pain. We will continue to monitor this
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
## 2012-05-03 ENCOUNTER — Telehealth: Payer: Self-pay | Admitting: Family Medicine

## 2012-05-03 MED ORDER — "GAUZE PADS & DRESSINGS 4""X4"" PADS": MEDICATED_PAD | Status: DC

## 2012-05-03 MED ORDER — SILVER SULFADIAZINE 1 % EX CREA: TOPICAL_CREAM | Freq: Every day | CUTANEOUS | Status: DC

## 2012-05-03 NOTE — Telephone Encounter (Signed)
Please advise 

## 2012-05-03 NOTE — Telephone Encounter (Signed)
Left message, I spoke with dermatology, he advises silvadene TID, keep moist, vaseline and aquaphor , complete antibiotics Silvadene recent with gauze pads

## 2012-05-20 ENCOUNTER — Ambulatory Visit: Payer: Medicaid Other | Admitting: Family Medicine

## 2012-05-20 ENCOUNTER — Encounter: Payer: Self-pay | Admitting: Family Medicine

## 2012-05-24 ENCOUNTER — Ambulatory Visit: Payer: Medicaid Other | Admitting: Family Medicine

## 2012-06-03 ENCOUNTER — Ambulatory Visit (INDEPENDENT_AMBULATORY_CARE_PROVIDER_SITE_OTHER): Payer: Medicaid Other | Admitting: Family Medicine

## 2012-06-03 ENCOUNTER — Encounter: Payer: Self-pay | Admitting: Family Medicine

## 2012-06-03 VITALS — BP 130/90 | HR 108 | Resp 16 | Ht 70.0 in | Wt 200.0 lb

## 2012-06-03 DIAGNOSIS — E669 Obesity, unspecified: Secondary | ICD-10-CM

## 2012-06-03 DIAGNOSIS — E119 Type 2 diabetes mellitus without complications: Secondary | ICD-10-CM

## 2012-06-03 DIAGNOSIS — I10 Essential (primary) hypertension: Secondary | ICD-10-CM

## 2012-06-03 DIAGNOSIS — R109 Unspecified abdominal pain: Secondary | ICD-10-CM

## 2012-06-03 DIAGNOSIS — N760 Acute vaginitis: Secondary | ICD-10-CM

## 2012-06-03 LAB — POCT URINALYSIS DIPSTICK
Bilirubin, UA: NEGATIVE
Ketones, UA: NEGATIVE
Leukocytes, UA: NEGATIVE
pH, UA: 6

## 2012-06-03 LAB — GLUCOSE, POCT (MANUAL RESULT ENTRY): POC Glucose: 284 mg/dl — AB (ref 70–99)

## 2012-06-03 MED ORDER — CYCLOBENZAPRINE HCL 5 MG PO TABS: 5.0000 mg | ORAL_TABLET | Freq: Every evening | ORAL | Status: DC | PRN

## 2012-06-03 MED ORDER — FLUCONAZOLE 150 MG PO TABS: 150.0000 mg | ORAL_TABLET | Freq: Once | ORAL | Status: DC

## 2012-06-03 MED ORDER — LISINOPRIL 5 MG PO TABS: 5.0000 mg | ORAL_TABLET | Freq: Every morning | ORAL | Status: DC

## 2012-06-03 MED ORDER — OXYCODONE-ACETAMINOPHEN 7.5-325 MG PO TABS: 1.0000 | ORAL_TABLET | Freq: Four times a day (QID) | ORAL | Status: DC | PRN

## 2012-06-03 NOTE — Patient Instructions (Addendum)
Get the labs done If your pain worsens go to the ER Drink fluids and eat small meals Nausea medication  F/U 3 months for DM
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
## 2012-06-04 LAB — LIPASE: Lipase: 13 U/L (ref 0–75)

## 2012-06-04 LAB — COMPREHENSIVE METABOLIC PANEL
Albumin: 4.5 g/dL (ref 3.5–5.2)
BUN: 15 mg/dL (ref 6–23)
Calcium: 9.8 mg/dL (ref 8.4–10.5)
Chloride: 99 mEq/L (ref 96–112)
Glucose, Bld: 219 mg/dL — ABNORMAL HIGH (ref 70–99)
Potassium: 4.6 mEq/L (ref 3.5–5.3)

## 2012-06-04 LAB — CBC WITH DIFFERENTIAL/PLATELET
Basophils Relative: 0 % (ref 0–1)
HCT: 40 % (ref 36.0–46.0)
Hemoglobin: 13.5 g/dL (ref 12.0–15.0)
Lymphocytes Relative: 27 % (ref 12–46)
MCHC: 33.8 g/dL (ref 30.0–36.0)
MCV: 87.9 fL (ref 78.0–100.0)
Monocytes Absolute: 0.5 10*3/uL (ref 0.1–1.0)
Monocytes Relative: 6 % (ref 3–12)
Neutro Abs: 5.8 10*3/uL (ref 1.7–7.7)

## 2012-06-04 LAB — HEMOGLOBIN A1C: Mean Plasma Glucose: 206 mg/dL — ABNORMAL HIGH (ref <117)

## 2012-06-05 ENCOUNTER — Encounter: Payer: Self-pay | Admitting: Family Medicine

## 2012-06-05 DIAGNOSIS — R109 Unspecified abdominal pain: Secondary | ICD-10-CM | POA: Insufficient documentation

## 2012-06-05 DIAGNOSIS — N760 Acute vaginitis: Secondary | ICD-10-CM | POA: Insufficient documentation

## 2012-06-05 NOTE — Assessment & Plan Note (Signed)
Start diflucan based one exam and elevated CBG

## 2012-06-05 NOTE — Assessment & Plan Note (Signed)
Unclear cause of pain, no appendix, UA negative, U preg neg, ? Food poisoning, less lilely colitis, with no current diarrhea or change in stools, ? Gallbladder etiology or pancreatitis. Plan check CBC, CMET, treat for vaginitis based on exam, given red flags

## 2012-06-05 NOTE — Assessment & Plan Note (Addendum)
Weight down 3lbs

## 2012-06-05 NOTE — Assessment & Plan Note (Signed)
Blood sugars uncontrolled, pt taking meds as prescribed per report, I do not think her diet is very good. Check A1C and adjust medications

## 2012-06-05 NOTE — Assessment & Plan Note (Signed)
Continue lisinopril, well controlled

## 2012-06-06 ENCOUNTER — Telehealth: Payer: Self-pay | Admitting: Family Medicine

## 2012-06-07 ENCOUNTER — Telehealth: Payer: Self-pay | Admitting: Family Medicine

## 2012-06-07 LAB — WET PREP BY MOLECULAR PROBE
Candida species: POSITIVE — AB
Trichomonas vaginosis: POSITIVE — AB

## 2012-06-07 MED ORDER — METRONIDAZOLE 500 MG PO TABS: 500.0000 mg | ORAL_TABLET | Freq: Two times a day (BID) | ORAL | Status: AC

## 2012-06-07 NOTE — Telephone Encounter (Signed)
Flagyl sent for Trichomonas infection, LVM for pt to return call

## 2012-06-09 NOTE — Telephone Encounter (Signed)
Patient aware per Dr Jeanice Lim

## 2012-06-28 ENCOUNTER — Other Ambulatory Visit: Payer: Self-pay | Admitting: Family Medicine

## 2012-06-30 ENCOUNTER — Telehealth: Payer: Self-pay | Admitting: Family Medicine

## 2012-06-30 MED ORDER — OXYCODONE-ACETAMINOPHEN 7.5-325 MG PO TABS: 1.0000 | ORAL_TABLET | Freq: Four times a day (QID) | ORAL | Status: DC | PRN

## 2012-06-30 NOTE — Telephone Encounter (Signed)
That's fine

## 2012-06-30 NOTE — Telephone Encounter (Signed)
Pt aware.

## 2012-06-30 NOTE — Telephone Encounter (Signed)
Is this ok with you  ?

## 2012-07-22 ENCOUNTER — Other Ambulatory Visit: Payer: Self-pay

## 2012-07-22 MED ORDER — OXYCODONE-ACETAMINOPHEN 7.5-325 MG PO TABS: 1.0000 | ORAL_TABLET | Freq: Four times a day (QID) | ORAL | Status: DC | PRN

## 2012-07-28 ENCOUNTER — Telehealth: Payer: Self-pay | Admitting: Family Medicine

## 2012-07-28 ENCOUNTER — Other Ambulatory Visit: Payer: Self-pay | Admitting: Family Medicine
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
## 2012-07-28 NOTE — Telephone Encounter (Signed)
Send in augmentin script after you speak with her please, and she needs to sched appt with Dr Jeanice Lim next week

## 2012-07-29 MED ORDER — AMOXICILLIN-POT CLAVULANATE 875-125 MG PO TABS: 1.0000 | ORAL_TABLET | Freq: Two times a day (BID) | ORAL | Status: DC

## 2012-07-29 NOTE — Telephone Encounter (Signed)
Sent in and pt aware 

## 2012-08-05 ENCOUNTER — Telehealth: Payer: Self-pay | Admitting: Family Medicine

## 2012-08-05 NOTE — Telephone Encounter (Signed)
Message left that med ready to be picked up. Ok to refill diflucan?

## 2012-08-07 NOTE — Telephone Encounter (Signed)
You may refill diflucan

## 2012-08-08 ENCOUNTER — Encounter: Payer: Self-pay | Admitting: Family Medicine

## 2012-08-08 ENCOUNTER — Ambulatory Visit (HOSPITAL_COMMUNITY)
Admission: RE | Admit: 2012-08-08 | Discharge: 2012-08-08 | Disposition: A | Discharge status: Home / Self Care | Payer: Medicaid Other | Source: Ambulatory Visit | Attending: Family Medicine | Admitting: Family Medicine

## 2012-08-08 ENCOUNTER — Ambulatory Visit (INDEPENDENT_AMBULATORY_CARE_PROVIDER_SITE_OTHER): Payer: Medicaid Other | Admitting: Family Medicine

## 2012-08-08 VITALS — BP 122/80 | HR 99 | Resp 16 | Ht 70.0 in | Wt 203.4 lb

## 2012-08-08 DIAGNOSIS — L97509 Non-pressure chronic ulcer of other part of unspecified foot with unspecified severity: Secondary | ICD-10-CM

## 2012-08-08 DIAGNOSIS — E1149 Type 2 diabetes mellitus with other diabetic neurological complication: Secondary | ICD-10-CM

## 2012-08-08 DIAGNOSIS — E119 Type 2 diabetes mellitus without complications: Secondary | ICD-10-CM

## 2012-08-08 DIAGNOSIS — E1169 Type 2 diabetes mellitus with other specified complication: Secondary | ICD-10-CM | POA: Insufficient documentation

## 2012-08-08 DIAGNOSIS — E1142 Type 2 diabetes mellitus with diabetic polyneuropathy: Secondary | ICD-10-CM

## 2012-08-08 DIAGNOSIS — E1140 Type 2 diabetes mellitus with diabetic neuropathy, unspecified: Secondary | ICD-10-CM

## 2012-08-08 MED ORDER — FLUCONAZOLE 150 MG PO TABS: 150.0000 mg | ORAL_TABLET | Freq: Once | ORAL | Status: AC

## 2012-08-08 NOTE — Patient Instructions (Signed)
Get the xray, pending results podiatry vs going to see Dr. Lovell Sheehan Continue insulin for diabetes Keep previous f/u appointment

## 2012-08-08 NOTE — Telephone Encounter (Signed)
refill sent in

## 2012-08-08 NOTE — Assessment & Plan Note (Signed)
Xray shows no subcutaneous gas, swelling or bony involvement. Continue augmentin, refer to podiatry for continued treatment

## 2012-08-08 NOTE — Progress Notes (Signed)
  Subjective:    Patient ID: Anne [name], female    DOB: [birth year], 31 y.o.   MRN: [id number]  HPI Patient presents to with ulceration of left foot. She's been out walking extended periods of time with her children school shopping when she noticed a callus that was on her toe opened up. She initially had some mild drainage however that has cleared. She's been keeping clean. She has significant discomfort on both feet where she has callus formation. She called and was started on Augmentin twice a day and is currently taking antibiotics. Her blood sugars have been labile she's had some hypoglycemic episodes into the 80s and which she is symptomatic up to 200s. She's taking Lantus 38 units and 8-10 units with her meals.    Review of Systems   GEN- no fever, recent illness   Skin- no rash- open ulcer per above   CVS- no CP  RESP- no SOB     Objective:   Physical Exam GEN-NAD, alert and oriented x 3 Foot- Left great toe- callus with center tiny ulceration, unable to probe to bone, no drainage, peeling callus on lateral foot, Right foot, s/p 4th 5th digit amputation, callus on heel- TTP no opening seen, callus on great toe, TTP  Ext- no swelling  Pulse- DP-        Assessment & Plan:

## 2012-08-08 NOTE — Assessment & Plan Note (Signed)
Causing worsening callus formation

## 2012-08-08 NOTE — Assessment & Plan Note (Signed)
Uncontrolled, continue meal coverage and lantus,recently increased

## 2012-08-09 ENCOUNTER — Telehealth: Payer: Self-pay | Admitting: Family Medicine

## 2012-08-10 NOTE — Telephone Encounter (Signed)
Called patient and left message for them to return call at the office   

## 2012-08-10 NOTE — Telephone Encounter (Signed)
Pt aware.

## 2012-08-18 ENCOUNTER — Ambulatory Visit (INDEPENDENT_AMBULATORY_CARE_PROVIDER_SITE_OTHER): Payer: Medicaid Other | Admitting: Family Medicine

## 2012-08-18 ENCOUNTER — Telehealth: Payer: Self-pay | Admitting: Family Medicine

## 2012-08-18 ENCOUNTER — Encounter: Payer: Self-pay | Admitting: Family Medicine

## 2012-08-18 VITALS — BP 130/88 | HR 111 | Resp 15 | Ht 70.0 in | Wt 197.4 lb

## 2012-08-18 DIAGNOSIS — L97509 Non-pressure chronic ulcer of other part of unspecified foot with unspecified severity: Secondary | ICD-10-CM

## 2012-08-18 DIAGNOSIS — E119 Type 2 diabetes mellitus without complications: Secondary | ICD-10-CM

## 2012-08-18 DIAGNOSIS — M869 Osteomyelitis, unspecified: Secondary | ICD-10-CM

## 2012-08-18 LAB — CBC
HCT: 38 % (ref 36.0–46.0)
MCV: 88.8 fL (ref 78.0–100.0)
Platelets: 163 10*3/uL (ref 150–400)
RBC: 4.28 MIL/uL (ref 3.87–5.11)
WBC: 7.9 10*3/uL (ref 4.0–10.5)

## 2012-08-18 MED ORDER — AMOXICILLIN-POT CLAVULANATE 875-125 MG PO TABS: 1.0000 | ORAL_TABLET | Freq: Two times a day (BID) | ORAL | Status: DC

## 2012-08-18 NOTE — Assessment & Plan Note (Signed)
Per above worsening exam

## 2012-08-18 NOTE — Progress Notes (Signed)
  Subjective:    Patient ID: Anne [name], female    DOB: 1981-02-17, 31 y.o.   MRN: [id number]  HPI Pt presents to f/u toe ulcer. She was seen by podiatry on September 28. The toe was actually improving regarding swelling and drainage which went up her appointment she then had a debridement at  the appointment since then she has had worsening swelling and pain with mild drainage. She has been using iodoflex per podiatry recommendations, she then went back to use of her bacitracin on the ulcer. She has 1 day of augmentin left because she had n/V 1 day earlier this week. Her CBG have been in 250's, on Novolog 10 units with meals and Lantus 38 units   Review of Systems - per above   GEN- denies fatigue, fever, weight loss,weakness, recent illness HEENT- denies eye drainage, change in vision, nasal discharge, CVS- denies chest pain, palpitations RESP- denies SOB, cough, wheeze ABD- denies N/V, change in stools, abd pain MSK- + joint pain, muscle aches, injury      Objective:   Physical Exam GEN-NAD, alert and oriented x 3 Foot- Left great toe- + ulceration, mild clear drainage, + swelling great toe, TTP diffusley, Callus shaved on left lateral foot  Right foot, s/p 4th 5th digit amputation, callus on heel- TTP no opening seen, callus on great toe shaved  Ext- no swelling of ext with exception of left toe Pulse- DP- 2+         Assessment & Plan:

## 2012-08-18 NOTE — Telephone Encounter (Signed)
Spoke with pt, she scheduled appt, toe looks worse s/p debridement by podiatry

## 2012-08-18 NOTE — Telephone Encounter (Signed)
Please advise 

## 2012-08-18 NOTE — Assessment & Plan Note (Addendum)
Uncontrolled in setting of infection, increase meal coverage to  12 units CBC 202 in office

## 2012-08-18 NOTE — Assessment & Plan Note (Signed)
Concerned for possible Osteomyeltitis based on worsening exam and swelling, pain. Will add additional week of augmentin total of 3 weeks, obtain MRI of foot  Pending results further referral . CBC, BMET to be done Pt has pain meds Advised off foot as much as possible

## 2012-08-18 NOTE — Patient Instructions (Signed)
Increase novolog to 12 units with meals Continue lantus Augmentin x 7 days added MRI of foot to be done Keep previous f/u

## 2012-08-19 ENCOUNTER — Ambulatory Visit (HOSPITAL_COMMUNITY)
Admission: RE | Admit: 2012-08-19 | Discharge: 2012-08-19 | Disposition: A | Discharge status: Home / Self Care | Payer: Medicaid Other | Source: Ambulatory Visit | Attending: Family Medicine | Admitting: Family Medicine

## 2012-08-19 ENCOUNTER — Encounter (HOSPITAL_COMMUNITY): Payer: Self-pay | Admitting: *Deleted

## 2012-08-19 ENCOUNTER — Inpatient Hospital Stay (HOSPITAL_COMMUNITY)
Admission: AD | Admit: 2012-08-19 | Discharge: 2012-08-21 | DRG: 639 | Disposition: A | Discharge status: Home / Self Care | Payer: Medicaid Other | Source: Ambulatory Visit | Attending: Internal Medicine | Admitting: Internal Medicine

## 2012-08-19 DIAGNOSIS — E7800 Pure hypercholesterolemia, unspecified: Secondary | ICD-10-CM | POA: Diagnosis present

## 2012-08-19 DIAGNOSIS — E1169 Type 2 diabetes mellitus with other specified complication: Principal | ICD-10-CM

## 2012-08-19 DIAGNOSIS — E1159 Type 2 diabetes mellitus with other circulatory complications: Secondary | ICD-10-CM | POA: Diagnosis present

## 2012-08-19 DIAGNOSIS — Z9089 Acquired absence of other organs: Secondary | ICD-10-CM

## 2012-08-19 DIAGNOSIS — Z79899 Other long term (current) drug therapy: Secondary | ICD-10-CM

## 2012-08-19 DIAGNOSIS — E669 Obesity, unspecified: Secondary | ICD-10-CM

## 2012-08-19 DIAGNOSIS — E1149 Type 2 diabetes mellitus with other diabetic neurological complication: Secondary | ICD-10-CM | POA: Diagnosis present

## 2012-08-19 DIAGNOSIS — Z23 Encounter for immunization: Secondary | ICD-10-CM

## 2012-08-19 DIAGNOSIS — E1142 Type 2 diabetes mellitus with diabetic polyneuropathy: Secondary | ICD-10-CM | POA: Diagnosis present

## 2012-08-19 DIAGNOSIS — M869 Osteomyelitis, unspecified: Secondary | ICD-10-CM

## 2012-08-19 DIAGNOSIS — G8929 Other chronic pain: Secondary | ICD-10-CM | POA: Diagnosis present

## 2012-08-19 DIAGNOSIS — E1140 Type 2 diabetes mellitus with diabetic neuropathy, unspecified: Secondary | ICD-10-CM

## 2012-08-19 DIAGNOSIS — Z794 Long term (current) use of insulin: Secondary | ICD-10-CM

## 2012-08-19 DIAGNOSIS — E119 Type 2 diabetes mellitus without complications: Secondary | ICD-10-CM

## 2012-08-19 DIAGNOSIS — K219 Gastro-esophageal reflux disease without esophagitis: Secondary | ICD-10-CM | POA: Diagnosis present

## 2012-08-19 DIAGNOSIS — L02619 Cutaneous abscess of unspecified foot: Secondary | ICD-10-CM

## 2012-08-19 DIAGNOSIS — M549 Dorsalgia, unspecified: Secondary | ICD-10-CM | POA: Diagnosis present

## 2012-08-19 DIAGNOSIS — L03119 Cellulitis of unspecified part of limb: Secondary | ICD-10-CM

## 2012-08-19 DIAGNOSIS — Z6828 Body mass index (BMI) 28.0-28.9, adult: Secondary | ICD-10-CM

## 2012-08-19 DIAGNOSIS — E11628 Type 2 diabetes mellitus with other skin complications: Secondary | ICD-10-CM

## 2012-08-19 DIAGNOSIS — F17200 Nicotine dependence, unspecified, uncomplicated: Secondary | ICD-10-CM | POA: Diagnosis present

## 2012-08-19 DIAGNOSIS — Z792 Long term (current) use of antibiotics: Secondary | ICD-10-CM

## 2012-08-19 DIAGNOSIS — S98139A Complete traumatic amputation of one unspecified lesser toe, initial encounter: Secondary | ICD-10-CM

## 2012-08-19 DIAGNOSIS — I10 Essential (primary) hypertension: Secondary | ICD-10-CM

## 2012-08-19 DIAGNOSIS — L97509 Non-pressure chronic ulcer of other part of unspecified foot with unspecified severity: Secondary | ICD-10-CM

## 2012-08-19 DIAGNOSIS — L03039 Cellulitis of unspecified toe: Secondary | ICD-10-CM | POA: Diagnosis present

## 2012-08-19 LAB — CBC WITH DIFFERENTIAL/PLATELET
Basophils Absolute: 0 10*3/uL (ref 0.0–0.1)
Basophils Relative: 0 % (ref 0–1)
Eosinophils Absolute: 0.2 10*3/uL (ref 0.0–0.7)
Eosinophils Relative: 3 % (ref 0–5)
HCT: 33.3 % — ABNORMAL LOW (ref 36.0–46.0)
Hemoglobin: 11.8 g/dL — ABNORMAL LOW (ref 12.0–15.0)
MCH: 30.6 pg (ref 26.0–34.0)
MCHC: 35.4 g/dL (ref 30.0–36.0)
MCV: 86.5 fL (ref 78.0–100.0)
Monocytes Absolute: 0.4 10*3/uL (ref 0.1–1.0)
Monocytes Relative: 6 % (ref 3–12)
RDW: 12.6 % (ref 11.5–15.5)

## 2012-08-19 LAB — BASIC METABOLIC PANEL
BUN: 14 mg/dL (ref 6–23)
CO2: 28 mEq/L (ref 19–32)
Chloride: 97 mEq/L (ref 96–112)
Creat: 0.71 mg/dL (ref 0.50–1.10)

## 2012-08-19 LAB — GLUCOSE, CAPILLARY: Glucose-Capillary: 130 mg/dL — ABNORMAL HIGH (ref 70–99)

## 2012-08-19 MED ORDER — LISINOPRIL 5 MG PO TABS
5.0000 mg | ORAL_TABLET | Freq: Every morning | ORAL | Status: DC
  Administered 2012-08-20 – 2012-08-21 (×2): 5 mg via ORAL
  Filled 2012-08-19 (×2): qty 1

## 2012-08-19 MED ORDER — PIPERACILLIN-TAZOBACTAM 3.375 G IVPB
INTRAVENOUS | Status: AC
  Filled 2012-08-19: qty 100

## 2012-08-19 MED ORDER — OXYCODONE-ACETAMINOPHEN 5-325 MG PO TABS
1.5000 | ORAL_TABLET | Freq: Four times a day (QID) | ORAL | Status: DC | PRN
  Administered 2012-08-19 – 2012-08-20 (×2): 1.5 via ORAL
  Filled 2012-08-19 (×2): qty 2

## 2012-08-19 MED ORDER — SIMVASTATIN 10 MG PO TABS
10.0000 mg | ORAL_TABLET | Freq: Every day | ORAL | Status: DC
  Administered 2012-08-19 – 2012-08-20 (×2): 10 mg via ORAL
  Filled 2012-08-19 (×2): qty 1

## 2012-08-19 MED ORDER — ONDANSETRON HCL 4 MG/2ML IJ SOLN: 4.0000 mg | Freq: Four times a day (QID) | INTRAMUSCULAR | Status: DC | PRN

## 2012-08-19 MED ORDER — VANCOMYCIN HCL IN DEXTROSE 1-5 GM/200ML-% IV SOLN
INTRAVENOUS | Status: AC
  Filled 2012-08-19: qty 400

## 2012-08-19 MED ORDER — ENOXAPARIN SODIUM 40 MG/0.4ML ~~LOC~~ SOLN
40.0000 mg | SUBCUTANEOUS | Status: DC
  Administered 2012-08-19 – 2012-08-20 (×2): 40 mg via SUBCUTANEOUS
  Filled 2012-08-19 (×2): qty 0.4

## 2012-08-19 MED ORDER — INSULIN ASPART 100 UNIT/ML ~~LOC~~ SOLN
4.0000 [IU] | Freq: Three times a day (TID) | SUBCUTANEOUS | Status: DC
  Administered 2012-08-20 – 2012-08-21 (×4): 4 [IU] via SUBCUTANEOUS

## 2012-08-19 MED ORDER — INSULIN ASPART 100 UNIT/ML ~~LOC~~ SOLN
0.0000 [IU] | Freq: Three times a day (TID) | SUBCUTANEOUS | Status: DC
  Administered 2012-08-20: 8 [IU] via SUBCUTANEOUS
  Administered 2012-08-20: 5 [IU] via SUBCUTANEOUS
  Administered 2012-08-20 – 2012-08-21 (×2): 3 [IU] via SUBCUTANEOUS

## 2012-08-19 MED ORDER — HYDROMORPHONE HCL PF 1 MG/ML IJ SOLN
0.5000 mg | INTRAMUSCULAR | Status: DC | PRN
  Administered 2012-08-20 – 2012-08-21 (×7): 0.5 mg via INTRAVENOUS
  Filled 2012-08-19 (×8): qty 1

## 2012-08-19 MED ORDER — PIPERACILLIN-TAZOBACTAM 3.375 G IVPB
3.3750 g | Freq: Three times a day (TID) | INTRAVENOUS | Status: DC
  Administered 2012-08-19 – 2012-08-21 (×5): 3.375 g via INTRAVENOUS
  Filled 2012-08-19 (×9): qty 50

## 2012-08-19 MED ORDER — GADOBENATE DIMEGLUMINE 529 MG/ML IV SOLN
18.0000 mL | Freq: Once | INTRAVENOUS | Status: AC | PRN
  Administered 2012-08-19: 18 mL via INTRAVENOUS

## 2012-08-19 MED ORDER — INSULIN ASPART 100 UNIT/ML ~~LOC~~ SOLN: 0.0000 [IU] | Freq: Every day | SUBCUTANEOUS | Status: DC

## 2012-08-19 MED ORDER — INSULIN GLARGINE 100 UNIT/ML ~~LOC~~ SOLN
38.0000 [IU] | Freq: Every day | SUBCUTANEOUS | Status: DC
  Administered 2012-08-19 – 2012-08-20 (×2): 38 [IU] via SUBCUTANEOUS

## 2012-08-19 MED ORDER — ALBUTEROL SULFATE (5 MG/ML) 0.5% IN NEBU: 2.5000 mg | INHALATION_SOLUTION | RESPIRATORY_TRACT | Status: DC | PRN

## 2012-08-19 MED ORDER — ACETAMINOPHEN 325 MG PO TABS: 650.0000 mg | ORAL_TABLET | Freq: Four times a day (QID) | ORAL | Status: DC | PRN

## 2012-08-19 MED ORDER — PANTOPRAZOLE SODIUM 40 MG PO TBEC
40.0000 mg | DELAYED_RELEASE_TABLET | Freq: Every day | ORAL | Status: DC
  Administered 2012-08-20: 40 mg via ORAL
  Filled 2012-08-19 (×2): qty 1

## 2012-08-19 MED ORDER — SODIUM CHLORIDE 0.45 % IV SOLN
INTRAVENOUS | Status: AC
  Administered 2012-08-19: 22:00:00 via INTRAVENOUS

## 2012-08-19 MED ORDER — ACETAMINOPHEN 650 MG RE SUPP: 650.0000 mg | Freq: Four times a day (QID) | RECTAL | Status: DC | PRN

## 2012-08-19 MED ORDER — INFLUENZA VIRUS VACC SPLIT PF IM SUSP
0.5000 mL | INTRAMUSCULAR | Status: AC
  Administered 2012-08-20: 0.5 mL via INTRAMUSCULAR
  Filled 2012-08-19: qty 0.5

## 2012-08-19 MED ORDER — OXYCODONE-ACETAMINOPHEN 7.5-325 MG PO TABS: 1.0000 | ORAL_TABLET | Freq: Four times a day (QID) | ORAL | Status: DC | PRN

## 2012-08-19 MED ORDER — VANCOMYCIN HCL IN DEXTROSE 1-5 GM/200ML-% IV SOLN
1000.0000 mg | Freq: Three times a day (TID) | INTRAVENOUS | Status: DC
  Administered 2012-08-19 – 2012-08-21 (×5): 1000 mg via INTRAVENOUS
  Filled 2012-08-19 (×9): qty 200

## 2012-08-19 MED ORDER — CYCLOBENZAPRINE HCL 10 MG PO TABS
5.0000 mg | ORAL_TABLET | Freq: Every evening | ORAL | Status: DC | PRN
  Administered 2012-08-19 – 2012-08-20 (×2): 5 mg via ORAL
  Filled 2012-08-19 (×2): qty 1

## 2012-08-19 MED ORDER — PNEUMOCOCCAL VAC POLYVALENT 25 MCG/0.5ML IJ INJ
0.5000 mL | INJECTION | INTRAMUSCULAR | Status: AC
  Administered 2012-08-20: 0.5 mL via INTRAMUSCULAR
  Filled 2012-08-19: qty 0.5

## 2012-08-19 MED ORDER — ONDANSETRON HCL 4 MG PO TABS: 4.0000 mg | ORAL_TABLET | Freq: Four times a day (QID) | ORAL | Status: DC | PRN

## 2012-08-19 MED ORDER — GABAPENTIN 300 MG PO CAPS
300.0000 mg | ORAL_CAPSULE | Freq: Every day | ORAL | Status: DC
  Administered 2012-08-19 – 2012-08-20 (×2): 300 mg via ORAL
  Filled 2012-08-19 (×2): qty 1
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
## 2012-08-20 DIAGNOSIS — I10 Essential (primary) hypertension: Secondary | ICD-10-CM

## 2012-08-20 LAB — BASIC METABOLIC PANEL
CO2: 28 mEq/L (ref 19–32)
Chloride: 102 mEq/L (ref 96–112)
Creatinine, Ser: 0.66 mg/dL (ref 0.50–1.10)
Sodium: 136 mEq/L (ref 135–145)

## 2012-08-20 LAB — CBC
HCT: 31.1 % — ABNORMAL LOW (ref 36.0–46.0)
Hemoglobin: 11 g/dL — ABNORMAL LOW (ref 12.0–15.0)
MCV: 86.4 fL (ref 78.0–100.0)
RBC: 3.6 MIL/uL — ABNORMAL LOW (ref 3.87–5.11)
WBC: 6.2 10*3/uL (ref 4.0–10.5)

## 2012-08-20 LAB — GLUCOSE, CAPILLARY
Glucose-Capillary: 175 mg/dL — ABNORMAL HIGH (ref 70–99)
Glucose-Capillary: 202 mg/dL — ABNORMAL HIGH (ref 70–99)

## 2012-08-20 MED ORDER — NICOTINE 14 MG/24HR TD PT24
14.0000 mg | MEDICATED_PATCH | Freq: Every day | TRANSDERMAL | Status: DC
  Administered 2012-08-20 – 2012-08-21 (×2): 14 mg via TRANSDERMAL
  Filled 2012-08-20 (×2): qty 1

## 2012-08-20 MED ORDER — SODIUM CHLORIDE 0.45 % IV SOLN
INTRAVENOUS | Status: DC
  Administered 2012-08-20: 20:00:00 via INTRAVENOUS

## 2012-08-20 MED ORDER — SODIUM CHLORIDE 0.9 % IJ SOLN
INTRAMUSCULAR | Status: AC
  Administered 2012-08-20: 19:00:00
  Filled 2012-08-20: qty 3

## 2012-08-20 NOTE — Progress Notes (Signed)
[facility] PROGRESS NOTE  VALISSA LYVERS NWG:[id number] DOB: 03/28/1981 DOA: 08/19/2012 PCP: Milinda Antis, MD  Assessment/Plan: Principal Problem:  *Cellulitis in diabetic foot Active Problems:  Type II diabetes mellitus  Diabetic neuropathy  Toe ulcer  Essential hypertension, benign  Osteomyelitis  1. Cellulitis.  Patient is on broad spectrum Iv antibiotics. She does not have any drainage from her wound, and erythema appears to be improving. MRI has non specific findings.  Discussed with Dr. Leticia Penna who will see the patient later today.  If patient continues to improve, then plan will likely be for extended course of po antibiotics with close outpatient follow up. 2. Diabetes. Follow blood sugars while in house, follow up hgba1c 3. HTN. Stable  Code Status: full code Family Communication: discussed with patient  Disposition Plan: probably discharge in next 24-48hours if continues to improve   Brief narrative: This lady was admitted to the hospital with worsening ulcer on her right great toe.  There was concern for possible underlying osteomyelitis, and therefore she was admitted for IV antibiotics.  Consultants:  Gen Surg, Dr. Leticia Penna  Procedures:  none  Antibiotics:  Vancomycin  Zosyn  HPI/Subjective: Feels better today, no drainage noted, ulcer is painful, no fevers.  Objective: Filed Vitals:   08/19/12 2007 08/20/12 0539 08/20/12 0737  BP: 131/86 114/78   Pulse: 92 73 77  Temp: 98.1 F (36.7 C) 98 F (36.7 C)   TempSrc: Oral Oral   Resp: 18 16 16   Height: 5\' 10"  (1.778 m)    Weight: 89.54 kg (197 lb 6.4 oz)    SpO2: 100% 96% 95%    Intake/Output Summary (Last 24 hours) at 08/20/12 1253 Last data filed at 08/20/12 0800  Gross per 24 hour  Intake 1346.25 ml  Output      0 ml  Net 1346.25 ml   Filed Weights   08/19/12 2007  Weight: 89.54 kg (197 lb 6.4 oz)    Exam:   General:  NAD  Cardiovascular: S1, s2, rrr  Respiratory: CTA  B  Abdomen: soft, nt, bs+  MSK: right great toe has small coin sized lesion on plantar aspect, without any drainage, tip of toe is erythematous  Data Reviewed: Basic Metabolic Panel:  Lab 08/20/12 5784 08/18/12 1705  NA 136 136  K 3.6 3.9  CL 102 97  CO2 28 28  GLUCOSE 130* 182*  BUN 16 14  CREATININE 0.66 0.71  CALCIUM 8.4 9.5  MG -- --  PHOS -- --   Liver Function Tests: No results found for this basename: AST:5,ALT:5,ALKPHOS:5,BILITOT:5,PROT:5,ALBUMIN:5 in the last 168 hours No results found for this basename: LIPASE:5,AMYLASE:5 in the last 168 hours No results found for this basename: AMMONIA:5 in the last 168 hours CBC:  Lab 08/20/12 0601 08/19/12 2050 08/18/12 1705  WBC 6.2 6.6 7.9  NEUTROABS -- 4.0 --  HGB 11.0* 11.8* 13.0  HCT 31.1* 33.3* 38.0  MCV 86.4 86.5 88.8  PLT 164 152 163   Cardiac Enzymes: No results found for this basename: CKTOTAL:5,CKMB:5,CKMBINDEX:5,TROPONINI:5 in the last 168 hours BNP (last 3 results) No results found for this basename: PROBNP:3 in the last 8760 hours CBG:  Lab 08/20/12 1149 08/20/12 0735 08/19/12 2141  GLUCAP 202* 175* 130*    No results found for this or any previous visit (from the past 240 hour(s)).   Studies: Mr Foot Left W Wo Contrast  08/19/2012  *RADIOLOGY REPORT*  Clinical Data: Draining wound in the plantar aspect of the great toe status  post debridement.  Question osteomyelitis. History of diabetes.  MRI OF THE LEFT FOREFOOT WITHOUT AND WITH CONTRAST  Technique:  Multiplanar, multisequence MR imaging was performed both before and after administration of intravenous contrast.  Contrast: 18mL MULTIHANCE GADOBENATE DIMEGLUMINE 529 MG/ML IV SOLN  Comparison: Radiographs 08/12/2012.  Findings: Plantar skin ulceration is seen distally within the great toe.  There is a small fluid collection distally within the plantar subcutaneous fat of the great toe.  This measures 6 mm maximally and demonstrates intermediate T1 signal,  suggesting a small hemorrhagic collection.  No other focal fluid collections are seen. There is mild diffuse enhancement of the plantar soft tissues in the great toe.  The distal phalanx of the great toe demonstrates mild diffuse marrow T2 hyperintensity and enhancement following contrast.  There is no T1 signal abnormality or cortical destruction.  There is no interphalangeal joint effusion.  The proximal phalanx appears normal.  The additional digits and metatarsals appear normal.  The alignment appears normal at the Lisfranc joint.  There is prominent T2 hyperintensity and enhancement throughout the interosseous muscles of the forefoot.  No focal fluid collections are evident.  There is minimal fluid associated with the flexor digitorum tendon sheaths.  IMPRESSION:  1.  Tiny fluid collection within the plantar subcutaneous fat of the distal great toe with adjacent diffuse soft tissue enhancement suggesting cellulitis. 2.  Nonspecific marrow T2 hyperintensity and enhancement of the distal phalanx of the great toe without cortical destruction or T1 signal abnormality.  This finding could be secondary to hyperemia or early marrow infection. 3.  No evidence of synovitis or septic arthritis. 4.  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   Original Report Authenticated By: Gerrianne Scale, M.D.     Scheduled Meds:   . enoxaparin (LOVENOX) injection  40 mg Subcutaneous Q24H  . gabapentin  300 mg Oral QHS  . influenza  inactive virus vaccine  0.5 mL Intramuscular Tomorrow-1000  . insulin aspart  0-15 Units Subcutaneous TID WC  . insulin aspart  0-5 Units Subcutaneous QHS  . insulin aspart  4 Units Subcutaneous TID WC  . insulin glargine  38 Units Subcutaneous QHS  . lisinopril  5 mg Oral q morning - 10a  . pantoprazole  40 mg Oral Q1200  . piperacillin-tazobactam (ZOSYN)  IV  3.375 g Intravenous Q8H  . pneumococcal 23  valent vaccine  0.5 mL Intramuscular Tomorrow-1000  . simvastatin  10 mg Oral q1800  . vancomycin  1,000 mg Intravenous Q8H   Continuous Infusions:   . sodium chloride 75 mL/hr at 08/19/12 2139    Principal Problem:  *Cellulitis in diabetic foot Active Problems:  Type II diabetes mellitus  Diabetic neuropathy  Toe ulcer  Essential hypertension, benign  Osteomyelitis    Time spent: 25 mins    MEMON,JEHANZEB  [facility] Pager [phone]. If 7PM-7AM, please contact night-coverage at [email or web address], password [facility] 08/20/2012, 12:53 PM  LOS: 1 day

## 2012-08-21 DIAGNOSIS — E1149 Type 2 diabetes mellitus with other diabetic neurological complication: Secondary | ICD-10-CM

## 2012-08-21 DIAGNOSIS — E669 Obesity, unspecified: Secondary | ICD-10-CM

## 2012-08-21 DIAGNOSIS — E1142 Type 2 diabetes mellitus with diabetic polyneuropathy: Secondary | ICD-10-CM

## 2012-08-21 LAB — VANCOMYCIN, TROUGH: Vancomycin Tr: 10.7 ug/mL (ref 10.0–20.0)

## 2012-08-21 LAB — CBC
HCT: 31.9 % — ABNORMAL LOW (ref 36.0–46.0)
Hemoglobin: 11 g/dL — ABNORMAL LOW (ref 12.0–15.0)
MCH: 29.6 pg (ref 26.0–34.0)
MCHC: 34.5 g/dL (ref 30.0–36.0)
MCV: 86 fL (ref 78.0–100.0)
Platelets: 164 10*3/uL (ref 150–400)
RBC: 3.71 MIL/uL — ABNORMAL LOW (ref 3.87–5.11)
RDW: 12.4 % (ref 11.5–15.5)
WBC: 7.4 10*3/uL (ref 4.0–10.5)

## 2012-08-21 LAB — GLUCOSE, CAPILLARY: Glucose-Capillary: 186 mg/dL — ABNORMAL HIGH (ref 70–99)

## 2012-08-21 MED ORDER — SULFAMETHOXAZOLE-TRIMETHOPRIM 800-160 MG PO TABS: 1.0000 | ORAL_TABLET | Freq: Two times a day (BID) | ORAL | Status: DC

## 2012-08-21 MED ORDER — VANCOMYCIN HCL 1000 MG IV SOLR
1500.0000 mg | Freq: Three times a day (TID) | INTRAVENOUS | Status: DC
  Filled 2012-08-21 (×4): qty 1500

## 2012-08-21 NOTE — Discharge Summary (Signed)
Physician Discharge Summary  Anne [name] WUJ:[id number] DOB: September 14, 1981 DOA: 08/19/2012  PCP: Milinda Antis, MD  Admit date: 08/19/2012 Discharge date: 08/21/2012  Recommendations for Outpatient Follow-up:  1. Patient will followup with Dr. Lovell Sheehan in 1-2 weeks 2. Followup with primary care physician in 2 weeks.  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 She was in her usual state of health till about one month ago, when she started noticing a wound in her left first toe. There apparently, was a callus there initially but there was no drainage. She tried to take care of this wound herself. However 2 weeks later, she started noticing some infection and, so, she was prescribed Augmentin. On September 16 she went to her primary care physician, who referred to her to a podiatrist whom she saw on September 20th and she debrided the wound. Apparently, the wound was healing well. However, 2 days later, she saw that the toe was becoming red. Started hurting more and there was a milky discharge. Denied any yellowish or greenish discharge. So, she went back to the doctor's office yesterday and the MRI was recommended. The MRI shows evidence for cellulitis and a suspicion for osteomyelitis. And, so, she was referred for admission. She denies any fever. She did not feel that the toe was anymore warm to touch than usual. The pain in the toe was a throbbing pain 7/10 in  intensity, which increases with walking. She denies any dizziness. She had a few episodes of vomiting a few days ago, but that has resolved. She has completed 12 days of Augmentin and is still on the same medication.   Hospital Course:  This lady was admitted to the hospital with left great toe cellulitis with concerns for possible underlying osteomyelitis. She was admitted to the hospital and started on IV antibiotics with vancomycin and Zosyn. MRI was done prior to admission which indicated some possible marrow edema and concern for underlying osteomyelitis. She was evaluated in the hospital and appeared that her wound has significantly improved with IV antibiotics. There is no fluid collection noted, no drainage or pus noted. She was also seen by Dr. Leticia Penna who agreed that this was unlikely to be an osteomyelitis and did not feel that any surgical intervention was necessary at this point. Oral antibiotics were recommended. Patient has been afebrile has a normal WBC count. She is requesting discharge home at this point. We will place her on oral antibiotics and have her followup with Dr. Lovell Sheehan in the next one to 2 weeks who knows her very well. She is advised to return to the emergency room if she has worsening edema, fever, erythema. She will contact Dr. Lovell Sheehan office in the next week to schedule an appointment. She can follow up with her primary care physician in the next 2 weeks.  Procedures:  none  Consultations:  Gen Surgery, Dr. Leticia Penna  Discharge Exam: Filed Vitals:   08/20/12 9562 08/20/12 1501 08/20/12 2037 08/21/12 1308  BP:  136/86 129/81 115/77  Pulse: 77 86 82 84  Temp:  97.8 F (36.6 C) 97.9 F (36.6 C) 98.4 F (36.9 C)  TempSrc:  Oral Oral Oral  Resp: 16 18 18 18   Height:      Weight:      SpO2: 95% 98% 98% 98%    General: NAD Cardiovascular: S1, S2, RRR Respiratory: CTA B EXT: left great toe has small ulcer on plantar aspect, erythema improving, no expressable  pus or drainage noted, appears to be less edematous.  Discharge Instructions      Discharge Orders    Future Appointments: Provider: Department: Dept Phone: Center:   09/06/2012 9:30 AM Salley Scarlet, MD [facility] [phone] [facility]     Future Orders Please Complete By Expires   Diet - low sodium heart healthy      Increase activity slowly      Call MD for:  temperature >100.4      Call MD for:  redness, tenderness, or signs of infection (pain, swelling, redness, odor or green/yellow discharge around incision site)          Medication List     As of 08/21/2012 12:37 PM    STOP taking these medications         amoxicillin-clavulanate 875-125 MG per tablet   Commonly known as: AUGMENTIN      TAKE these medications         cyclobenzaprine 5 MG tablet   Commonly known as: FLEXERIL   Take 5 mg by mouth 3 (three) times daily as needed. Muscle Spasms      gabapentin 300 MG capsule   Commonly known as: NEURONTIN   Take 1 capsule (300 mg total) by mouth at bedtime.      insulin aspart 100 UNIT/ML injection   Commonly known as: novoLOG   Inject 12 Units into the skin 3 (three) times daily before meals.      insulin glargine 100 UNIT/ML injection   Commonly known as: LANTUS   Inject 38 Units into the skin at bedtime.      lisinopril 5 MG tablet   Commonly known as: PRINIVIL,ZESTRIL   Take 1 tablet (5 mg total) by mouth every morning.      metFORMIN 1000 MG tablet   Commonly known as: GLUCOPHAGE   Take 1 tablet (1,000 mg total) by mouth 2 (two) times daily.      mupirocin ointment 2 %   Commonly known as: BACTROBAN   Apply topically 2 (two) times daily.      naproxen 500 MG tablet   Commonly known as: NAPROSYN   Take 500 mg by mouth 2 (two) times daily.      omeprazole 20 MG capsule   Commonly known as: PRILOSEC   Take 1 capsule (20 mg total) by mouth every morning.      oxyCODONE-acetaminophen 7.5-325 MG per tablet   Commonly known as: PERCOCET    Take 1 tablet by mouth every 6 (six) hours as needed. FOR PAIN      pravastatin 20 MG tablet   Commonly known as: PRAVACHOL   Take 1 tablet (20 mg total) by mouth every morning.      silver sulfADIAZINE 1 % cream   Commonly known as: SILVADENE   Apply topically daily. Apply to skin three times      sulfamethoxazole-trimethoprim 800-160 MG per tablet   Commonly known as: BACTRIM DS,SEPTRA DS   Take 1 tablet by mouth  2 (two) times daily.        Follow-up Information    Follow up with Dalia Heading, MD. (in 1-2 weeks)    Contact information:   [address] [place] [address] [phone]       Follow up with Milinda Antis, MD. Schedule an appointment as soon as possible for a visit in 2 weeks.   Contact information:   [address] [place] [address] [phone]           The results of significant diagnostics from this hospitalization (including imaging, microbiology, ancillary and laboratory) are listed below for reference.    Significant Diagnostic Studies: Mr Foot Left W Wo Contrast  08/19/2012  *RADIOLOGY REPORT*  Clinical Data: Draining wound in the plantar aspect of the great toe status post debridement.  Question osteomyelitis. History of diabetes.  MRI OF THE LEFT FOREFOOT WITHOUT AND WITH CONTRAST  Technique:  Multiplanar, multisequence MR imaging was performed both before and after administration of intravenous contrast.  Contrast: 18mL MULTIHANCE GADOBENATE DIMEGLUMINE 529 MG/ML IV SOLN  Comparison: Radiographs 08/12/2012.  Findings: Plantar skin ulceration is seen distally within the great toe.  There is a small fluid collection distally within the plantar subcutaneous fat of the great toe.  This measures 6 mm maximally and demonstrates intermediate T1 signal, suggesting a small hemorrhagic collection.  No other focal fluid collections are seen. There is mild diffuse enhancement of the plantar soft tissues in the great toe.  The distal  phalanx of the great toe demonstrates mild diffuse marrow T2 hyperintensity and enhancement following contrast.  There is no T1 signal abnormality or cortical destruction.  There is no interphalangeal joint effusion.  The proximal phalanx appears normal.  The additional digits and metatarsals appear normal.  The alignment appears normal at the Lisfranc joint.  There is prominent T2 hyperintensity and enhancement throughout the interosseous muscles of the forefoot.  No focal fluid collections are evident.  There is minimal fluid associated with the flexor digitorum tendon sheaths.  IMPRESSION:  1.  Tiny fluid collection within the plantar subcutaneous fat of the distal great toe with adjacent diffuse soft tissue enhancement suggesting cellulitis. 2.  Nonspecific marrow T2 hyperintensity and enhancement of the distal phalanx of the great toe without cortical destruction or T1 signal abnormality.  This finding could be secondary to hyperemia or early marrow infection. 3.  No evidence of synovitis or septic arthritis. 4.  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   Original Report Authenticated By: Gerrianne Scale, M.D.    Dg Foot Complete Left  08/08/2012  *RADIOLOGY REPORT*  Clinical Data: Open ulcer on left great toe  LEFT FOOT - COMPLETE 3+ VIEW  Comparison: None  Findings: There is no evidence of fracture or dislocation.  There is no evidence of arthropathy or other focal bone abnormality. Soft tissues are unremarkable.  IMPRESSION: Negative exam.   Original Report Authenticated By: Rosealee Albee, M.D.     Microbiology: No results found for this or any previous visit (from the past 240 hour(s)).   Labs: Basic Metabolic Panel:  Lab 08/20/12 5366 08/18/12 1705  NA 136 136  K 3.6 3.9  CL 102 97  CO2 28 28  GLUCOSE 130* 182*  BUN 16 14  CREATININE 0.66 0.71  CALCIUM 8.4 9.5  MG -- --  PHOS -- --   Liver  Function Tests: No results found for this basename:  AST:5,ALT:5,ALKPHOS:5,BILITOT:5,PROT:5,ALBUMIN:5 in the last 168 hours No results found for this basename: LIPASE:5,AMYLASE:5 in the last 168 hours No results found for this basename: AMMONIA:5 in the last 168 hours CBC:  Lab 08/21/12 0444 08/20/12 0601 08/19/12 2050 08/18/12 1705  WBC 7.4 6.2 6.6 7.9  NEUTROABS -- -- 4.0 --  HGB 11.0* 11.0* 11.8* 13.0  HCT 31.9* 31.1* 33.3* 38.0  MCV 86.0 86.4 86.5 88.8  PLT 164 164 152 163   Cardiac Enzymes: No results found for this basename: CKTOTAL:5,CKMB:5,CKMBINDEX:5,TROPONINI:5 in the last 168 hours BNP: BNP (last 3 results) No results found for this basename: PROBNP:3 in the last 8760 hours CBG:  Lab 08/21/12 1146 08/21/12 0803 08/20/12 2102 08/20/12 1622 08/20/12 1149  GLUCAP 173* 186* 156* 268* 202*    Time coordinating discharge: greater than 30 minutes  Signed:  Cami Delawder  [facility] 08/21/2012, 12:37 PM

## 2012-08-21 NOTE — Progress Notes (Signed)
ANTIBIOTIC CONSULT NOTE - INITIAL  Pharmacy Consult for Vancomycin & Zosyn Indication: cellulitis/osteomyelitis  No Known Allergies  Patient Measurements: Height: 5\' 10"  (177.8 cm) Weight: 197 lb 6.4 oz (89.54 kg) IBW/kg (Calculated) : 68.5   Vital Signs: Temp: 98.4 F (36.9 C) (09/29 0655) Temp src: Oral (09/29 0655) BP: 115/77 mmHg (09/29 0655) Pulse Rate: 84  (09/29 0655) Intake/Output from previous day: 09/28 0701 - 09/29 0700 In: 1673.8 [P.O.:720; I.V.:703.8; IV Piggyback:250] Out: -  Intake/Output from this shift:    Labs:  Basename 08/21/12 0444 08/20/12 0601 08/19/12 2050 08/18/12 1705  WBC 7.4 6.2 6.6 --  HGB 11.0* 11.0* 11.8* --  PLT 164 164 152 --  LABCREA -- -- -- --  CREATININE -- 0.66 -- 0.71   Estimated Creatinine Clearance: 122.6 ml/min (by C-G formula based on Cr of 0.66).  Basename 08/21/12 0444  VANCOTROUGH 10.7  VANCOPEAK --  VANCORANDOM --  GENTTROUGH --  GENTPEAK --  GENTRANDOM --  TOBRATROUGH --  TOBRAPEAK --  TOBRARND --  AMIKACINPEAK --  AMIKACINTROU --  AMIKACIN --     Microbiology: No results found for this or any previous visit (from the past 720 hour(s)).  Medical History: Past Medical History  Diagnosis Date  . Diabetes mellitus   . Hypertension   . Acid reflux   . Diabetes mellitus type 1 10/31/2011  . Hypercholesteremia     Medications:  Prescriptions prior to admission  Medication Sig Dispense Refill  . amoxicillin-clavulanate (AUGMENTIN) 875-125 MG per tablet Take 1 tablet by mouth every 12 (twelve) hours.  14 tablet  0  . cyclobenzaprine (FLEXERIL) 5 MG tablet Take 5 mg by mouth 3 (three) times daily as needed. Muscle Spasms      . gabapentin (NEURONTIN) 300 MG capsule Take 1 capsule (300 mg total) by mouth at bedtime.  30 capsule  3  . insulin aspart (NOVOLOG) 100 UNIT/ML injection Inject 12 Units into the skin 3 (three) times daily before meals.      . insulin glargine (LANTUS) 100 UNIT/ML injection Inject 38  Units into the skin at bedtime.  20 mL  3  . lisinopril (PRINIVIL,ZESTRIL) 5 MG tablet Take 1 tablet (5 mg total) by mouth every morning.  30 tablet  3  . metFORMIN (GLUCOPHAGE) 1000 MG tablet Take 1 tablet (1,000 mg total) by mouth 2 (two) times daily.  60 tablet  3  . mupirocin ointment (BACTROBAN) 2 % Apply topically 2 (two) times daily.      . naproxen (NAPROSYN) 500 MG tablet Take 500 mg by mouth 2 (two) times daily.       Marland Kitchen omeprazole (PRILOSEC) 20 MG capsule Take 1 capsule (20 mg total) by mouth every morning.  30 capsule  3  . oxyCODONE-acetaminophen (PERCOCET) 7.5-325 MG per tablet Take 1 tablet by mouth every 6 (six) hours as needed. FOR PAIN  60 tablet  0  . pravastatin (PRAVACHOL) 20 MG tablet Take 1 tablet (20 mg total) by mouth every morning.  30 tablet  3  . silver sulfADIAZINE (SILVADENE) 1 % cream Apply topically daily. Apply to skin three times  400 g  2   Assessment: 31 yo F with cellulitis of left first toe.  MRI suspicious for osteomyelitis so will target higher trough goal.  She has completed 14 days of Augmentin.  Excellent renal function.  Trough goal below desired range today. Pt clinically improving.   Goal of Therapy:  Vancomycin trough level 15-20 mcg/ml  Plan:  1) Zosyn 3.375gm IV Q8h to be infused over 4hrs 2) Increase Vancomycin 1500 mg IV Q8h 3) Weekly Vancomycin trough  4) Monitor renal function and cx data   Anne [name] 08/21/2012,9:11 AM

## 2012-08-21 NOTE — Progress Notes (Signed)
Pt discharge home with instructions, prescriptions, and care notes.  Pt verbalized understanding and was transferred via w/c with staff in stable condition.  No further complaints or concerns voiced at this time. 

## 2012-08-25 ENCOUNTER — Ambulatory Visit: Payer: Medicaid Other | Admitting: Family Medicine

## 2012-08-30 ENCOUNTER — Encounter: Payer: Self-pay | Admitting: Family Medicine

## 2012-08-30 ENCOUNTER — Ambulatory Visit (INDEPENDENT_AMBULATORY_CARE_PROVIDER_SITE_OTHER): Payer: Medicaid Other | Admitting: Family Medicine

## 2012-08-30 VITALS — BP 126/70 | HR 100 | Resp 18 | Ht 70.0 in | Wt 202.1 lb

## 2012-08-30 DIAGNOSIS — E119 Type 2 diabetes mellitus without complications: Secondary | ICD-10-CM

## 2012-08-30 DIAGNOSIS — E1169 Type 2 diabetes mellitus with other specified complication: Secondary | ICD-10-CM

## 2012-08-30 DIAGNOSIS — E11628 Type 2 diabetes mellitus with other skin complications: Secondary | ICD-10-CM

## 2012-08-30 DIAGNOSIS — L97509 Non-pressure chronic ulcer of other part of unspecified foot with unspecified severity: Secondary | ICD-10-CM

## 2012-08-30 DIAGNOSIS — L03119 Cellulitis of unspecified part of limb: Secondary | ICD-10-CM

## 2012-08-30 DIAGNOSIS — E785 Hyperlipidemia, unspecified: Secondary | ICD-10-CM

## 2012-08-30 DIAGNOSIS — L02619 Cutaneous abscess of unspecified foot: Secondary | ICD-10-CM

## 2012-08-30 MED ORDER — CYCLOBENZAPRINE HCL 5 MG PO TABS: 5.0000 mg | ORAL_TABLET | Freq: Three times a day (TID) | ORAL | Status: DC | PRN

## 2012-08-30 MED ORDER — PRAVASTATIN SODIUM 20 MG PO TABS: 20.0000 mg | ORAL_TABLET | Freq: Every morning | ORAL | Status: DC

## 2012-08-30 MED ORDER — METFORMIN HCL 1000 MG PO TABS: 1000.0000 mg | ORAL_TABLET | Freq: Two times a day (BID) | ORAL | Status: DC

## 2012-08-30 MED ORDER — OMEPRAZOLE 20 MG PO CPDR: 20.0000 mg | DELAYED_RELEASE_CAPSULE | Freq: Every morning | ORAL | Status: DC

## 2012-08-30 MED ORDER — LISINOPRIL 5 MG PO TABS: 5.0000 mg | ORAL_TABLET | Freq: Every morning | ORAL | Status: DC

## 2012-08-30 MED ORDER — OXYCODONE-ACETAMINOPHEN 7.5-325 MG PO TABS: 1.0000 | ORAL_TABLET | Freq: Four times a day (QID) | ORAL | Status: DC | PRN

## 2012-08-30 NOTE — Progress Notes (Signed)
  Subjective:    Patient ID: Anne [name], female    DOB: 07-07-1981, 31 y.o.   MRN: [id number]  HPI Patient here to follow possible mission for diabetic foot ulcer and was concern for osteomyelitis however after evaluation by surgical team about osteomyelitis was have not set in. She did receive IV antibiotics and was sent home on Bactrim. She missed her followup appointment with Gen. surgery. She states that she had a blisterlike lesion on the top of the toe above the ulcer which ruptured and the skin and feels off however now the looks much better and the pain is improving. She's not had any drainage from the ulcer itself. She denies any fever, fatigue, nausea vomiting. Her blood sugars have been less than 200 for the past week then she was discharged from the hospital.   Review of Systems     Objective:   Physical Exam GEN-NAD, alert and oriented x 3 CVS-RRR, no murmur RESP-CTAB Foot- Left great toe- + ulceration, no drainage, minimal swelling great toe, non tender, peeling skin surrounding Ext- no swelling of ext with exception of left toe Pulse- DP- 2+         Assessment & Plan:

## 2012-08-30 NOTE — Patient Instructions (Addendum)
Referral to wound center Increase Lantus to 40 units, continue novolog with meals  Reschedule with Dr. Barbara Cower appointment for October  F/U 2 months for diabetes

## 2012-08-31 ENCOUNTER — Encounter: Payer: Self-pay | Admitting: Family Medicine

## 2012-08-31 NOTE — Assessment & Plan Note (Signed)
Improved, however still has open ulcer, will send to wound clinic until healed

## 2012-08-31 NOTE — Assessment & Plan Note (Signed)
discsused importance of adhering to diet and insulin, she tells me today she has not been very compliant with her foods and insulin at times.  Increase Lantus to 40 units, 15 units novolog with meals, A1C 9.3%. She has difficulty with transportation and finances, so we will try to hold off on endocrine as long as we can

## 2012-08-31 NOTE — Assessment & Plan Note (Signed)
Wound referral

## 2012-09-05 ENCOUNTER — Ambulatory Visit: Payer: Medicaid Other | Admitting: Family Medicine

## 2012-09-06 ENCOUNTER — Ambulatory Visit: Payer: Medicaid Other | Admitting: Family Medicine

## 2012-09-30 ENCOUNTER — Telehealth: Payer: Self-pay | Admitting: Family Medicine

## 2012-09-30 NOTE — Telephone Encounter (Signed)
She said the layer of skin that was covering it came off when she took a bath and its starting to hurt a bit and she wants to stay ontop of things before it gets infected again - wants to know if you will refill again and also refill her pain meds

## 2012-09-30 NOTE — Telephone Encounter (Signed)
The wound needs to be looked at. She should be followed at wound center, if not appt needs to be made there or she needs to come in. I would prefer not start antibiotics until someone looks at it since she has been on many rounds of antibiotics, tell her to go to Urgent Care if needed today.  You can call in Vicodin 7.5-500mg   1 po q 6hrs, #45.

## 2012-09-30 NOTE — Telephone Encounter (Signed)
Patient aware.

## 2012-09-30 NOTE — Telephone Encounter (Signed)
Called patient and left message for them to return call at the office   

## 2012-10-03 ENCOUNTER — Telehealth: Payer: Self-pay | Admitting: Family Medicine

## 2012-10-03 NOTE — Telephone Encounter (Signed)
noted 

## 2012-10-06 ENCOUNTER — Telehealth: Payer: Self-pay | Admitting: Family Medicine

## 2012-10-07 ENCOUNTER — Ambulatory Visit (INDEPENDENT_AMBULATORY_CARE_PROVIDER_SITE_OTHER): Payer: Medicaid Other | Admitting: Family Medicine

## 2012-10-07 ENCOUNTER — Encounter: Payer: Self-pay | Admitting: Family Medicine

## 2012-10-07 VITALS — BP 128/88 | HR 93 | Resp 16 | Ht 70.0 in | Wt 205.0 lb

## 2012-10-07 DIAGNOSIS — D229 Melanocytic nevi, unspecified: Secondary | ICD-10-CM

## 2012-10-07 DIAGNOSIS — D239 Other benign neoplasm of skin, unspecified: Secondary | ICD-10-CM

## 2012-10-07 DIAGNOSIS — L97509 Non-pressure chronic ulcer of other part of unspecified foot with unspecified severity: Secondary | ICD-10-CM

## 2012-10-07 MED ORDER — OXYCODONE-ACETAMINOPHEN 7.5-325 MG PO TABS: 1.0000 | ORAL_TABLET | Freq: Four times a day (QID) | ORAL | Status: DC | PRN

## 2012-10-07 NOTE — Assessment & Plan Note (Addendum)
No signs of acute infection no antibiotics needed, she f/u with wound clinic next week, will hold use of iodoflex until then, keep clean Note pt did not pick up Vicodin, this was not sent in Chronic pain meds refilled

## 2012-10-07 NOTE — Assessment & Plan Note (Signed)
Multiple moles, lesion in question irritated by bra but appears benign, will monitor, if it changes, will biopsy

## 2012-10-07 NOTE — Telephone Encounter (Signed)
She has an appt later today and would not be able to come back. Since she is in [place] now she is going to come by. Do you want it an OV or to just check the area?

## 2012-10-07 NOTE — Patient Instructions (Signed)
Pain medication refilled Change bandage every 2-3 days  Keep previous f/u appointment

## 2012-10-07 NOTE — Telephone Encounter (Signed)
She was given pain medicine last week, this can not be refilled, she needs to have the foot looked at. Put her in schedule last of the day if needed

## 2012-10-07 NOTE — Telephone Encounter (Signed)
Pt seen

## 2012-10-07 NOTE — Progress Notes (Signed)
  Subjective:    Patient ID: Anne [name], female    DOB: [birth year], 31 y.o.   MRN: [id number]  HPI Patient presents with redness of her left great toe. This is to the we have been following with an ulcer. Last week it was read however had no drainage but increased pain. She is being followed by the wound Center [facility] a Dr. has been out sick for the past week. She is peroxide on the wound is now improved. She states that every time she used to Iodoflex that she was advised to use by wound care that he gets stuck in the ulcer and starts to look infected. She is stop using this and is now using bacitracin with dressing She also has a mole on her back she will like me to look at, isn't itching at her bra line   Review of Systems - per above   GEN- denies fatigue, fever, weight loss,weakness, recent illness MSK- denies joint pain, muscle aches, injury Skin- + redness       Objective:   Physical Exam GEN-NAD, alert and oriented x 3 Skin- back at bra line- small hyperpigemented brown slightly raised mole, NT, non fluctant, multiple flat moles on back  Foot- Left great toe- + ulceration, no drainage, d/ci/i, no warmth, no swelling great toe, non tender, peeling skin surrounding, no cellulitis Ext- no edema Pulse- DP- 2+       Assessment & Plan:

## 2012-10-07 NOTE — Telephone Encounter (Signed)
Her appt with Tanda Rockers was cancelled because he was sick. Switched it to [employer] but he was still sick. Got some peroxide and poured it on her toe and the swelling and redness went down but its still hurting and she was wanting a short course of antibiotics and maybe a refill of pain med. She is at [employer] now getting DM show eval. Will check back again when she is on her way back to [facility]

## 2012-10-18 NOTE — Progress Notes (Signed)
UR chart review completed.  

## 2012-11-07 ENCOUNTER — Ambulatory Visit (INDEPENDENT_AMBULATORY_CARE_PROVIDER_SITE_OTHER): Payer: Medicaid Other | Admitting: Family Medicine

## 2012-11-07 ENCOUNTER — Encounter: Payer: Self-pay | Admitting: Family Medicine

## 2012-11-07 VITALS — BP 126/78 | HR 98 | Resp 18 | Ht 70.0 in | Wt 209.0 lb

## 2012-11-07 DIAGNOSIS — E119 Type 2 diabetes mellitus without complications: Secondary | ICD-10-CM

## 2012-11-07 DIAGNOSIS — I10 Essential (primary) hypertension: Secondary | ICD-10-CM

## 2012-11-07 DIAGNOSIS — L97509 Non-pressure chronic ulcer of other part of unspecified foot with unspecified severity: Secondary | ICD-10-CM

## 2012-11-07 MED ORDER — OXYCODONE-ACETAMINOPHEN 7.5-325 MG PO TABS: 1.0000 | ORAL_TABLET | Freq: Four times a day (QID) | ORAL | Status: DC | PRN

## 2012-11-07 NOTE — Assessment & Plan Note (Signed)
Well controlled 

## 2012-11-07 NOTE — Assessment & Plan Note (Signed)
Uncontrolled, increase lantus to 45 units Continue meal coverage and oral meds DMV form completed A1C pending

## 2012-11-07 NOTE — Patient Instructions (Addendum)
Increase to lantus 45units Continue novolog 15 units with each meal DMV form completed Get the labs done fasting within the week F/U 2 months

## 2012-11-07 NOTE — Assessment & Plan Note (Signed)
Very slow healing followed by wound center, uncontrolled DM causing slow healing ulcer
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
## 2012-11-28 ENCOUNTER — Other Ambulatory Visit: Payer: Self-pay | Admitting: Family Medicine

## 2012-12-26 ENCOUNTER — Other Ambulatory Visit: Payer: Self-pay | Admitting: Family Medicine

## 2013-02-01 ENCOUNTER — Other Ambulatory Visit: Payer: Self-pay

## 2013-02-01 MED ORDER — LISINOPRIL 5 MG PO TABS: 5.0000 mg | ORAL_TABLET | Freq: Every morning | ORAL | Status: DC

## 2013-02-01 MED ORDER — METFORMIN HCL 1000 MG PO TABS: 1000.0000 mg | ORAL_TABLET | Freq: Two times a day (BID) | ORAL | Status: DC

## 2013-02-01 MED ORDER — PRAVASTATIN SODIUM 20 MG PO TABS: 20.0000 mg | ORAL_TABLET | Freq: Every morning | ORAL | Status: DC

## 2013-02-01 MED ORDER — OMEPRAZOLE 20 MG PO CPDR: 20.0000 mg | DELAYED_RELEASE_CAPSULE | Freq: Every morning | ORAL | Status: DC

## 2013-02-13 ENCOUNTER — Ambulatory Visit: Payer: Medicaid Other | Admitting: Family Medicine

## 2013-02-17 ENCOUNTER — Ambulatory Visit: Payer: Medicaid Other | Admitting: Family Medicine

## 2013-04-19 ENCOUNTER — Other Ambulatory Visit: Payer: Self-pay

## 2013-04-19 MED ORDER — INSULIN GLARGINE 100 UNIT/ML ~~LOC~~ SOLN: SUBCUTANEOUS | Status: DC

## 2013-06-26 ENCOUNTER — Emergency Department (HOSPITAL_COMMUNITY): Payer: Medicaid Other

## 2013-06-26 ENCOUNTER — Encounter (HOSPITAL_COMMUNITY): Payer: Self-pay | Admitting: Emergency Medicine

## 2013-06-26 ENCOUNTER — Emergency Department (HOSPITAL_COMMUNITY)
Admission: EM | Admit: 2013-06-26 | Discharge: 2013-06-26 | Disposition: A | Discharge status: Home / Self Care | Payer: Medicaid Other | Attending: Emergency Medicine | Admitting: Emergency Medicine

## 2013-06-26 DIAGNOSIS — R161 Splenomegaly, not elsewhere classified: Secondary | ICD-10-CM

## 2013-06-26 DIAGNOSIS — Z79899 Other long term (current) drug therapy: Secondary | ICD-10-CM | POA: Insufficient documentation

## 2013-06-26 DIAGNOSIS — Z791 Long term (current) use of non-steroidal anti-inflammatories (NSAID): Secondary | ICD-10-CM | POA: Insufficient documentation

## 2013-06-26 DIAGNOSIS — R059 Cough, unspecified: Secondary | ICD-10-CM | POA: Insufficient documentation

## 2013-06-26 DIAGNOSIS — K7689 Other specified diseases of liver: Secondary | ICD-10-CM | POA: Insufficient documentation

## 2013-06-26 DIAGNOSIS — R05 Cough: Secondary | ICD-10-CM | POA: Insufficient documentation

## 2013-06-26 DIAGNOSIS — R110 Nausea: Secondary | ICD-10-CM | POA: Insufficient documentation

## 2013-06-26 DIAGNOSIS — Z8739 Personal history of other diseases of the musculoskeletal system and connective tissue: Secondary | ICD-10-CM | POA: Insufficient documentation

## 2013-06-26 DIAGNOSIS — F17200 Nicotine dependence, unspecified, uncomplicated: Secondary | ICD-10-CM | POA: Insufficient documentation

## 2013-06-26 DIAGNOSIS — N898 Other specified noninflammatory disorders of vagina: Secondary | ICD-10-CM | POA: Insufficient documentation

## 2013-06-26 DIAGNOSIS — I10 Essential (primary) hypertension: Secondary | ICD-10-CM | POA: Insufficient documentation

## 2013-06-26 DIAGNOSIS — K760 Fatty (change of) liver, not elsewhere classified: Secondary | ICD-10-CM

## 2013-06-26 DIAGNOSIS — Z794 Long term (current) use of insulin: Secondary | ICD-10-CM | POA: Insufficient documentation

## 2013-06-26 DIAGNOSIS — N12 Tubulo-interstitial nephritis, not specified as acute or chronic: Secondary | ICD-10-CM | POA: Insufficient documentation

## 2013-06-26 DIAGNOSIS — M549 Dorsalgia, unspecified: Secondary | ICD-10-CM | POA: Insufficient documentation

## 2013-06-26 DIAGNOSIS — E109 Type 1 diabetes mellitus without complications: Secondary | ICD-10-CM | POA: Insufficient documentation

## 2013-06-26 DIAGNOSIS — E7800 Pure hypercholesterolemia, unspecified: Secondary | ICD-10-CM | POA: Insufficient documentation

## 2013-06-26 DIAGNOSIS — Z3202 Encounter for pregnancy test, result negative: Secondary | ICD-10-CM | POA: Insufficient documentation

## 2013-06-26 DIAGNOSIS — Z9851 Tubal ligation status: Secondary | ICD-10-CM | POA: Insufficient documentation

## 2013-06-26 HISTORY — DX: Sciatica, unspecified side: M54.30

## 2013-06-26 LAB — CBC WITH DIFFERENTIAL/PLATELET
Eosinophils Absolute: 0.2 10*3/uL (ref 0.0–0.7)
Eosinophils Relative: 3 % (ref 0–5)
HCT: 39.4 % (ref 36.0–46.0)
Hemoglobin: 14 g/dL (ref 12.0–15.0)
Lymphs Abs: 1.9 10*3/uL (ref 0.7–4.0)
MCH: 31.4 pg (ref 26.0–34.0)
MCV: 88.3 fL (ref 78.0–100.0)
Monocytes Relative: 4 % (ref 3–12)
RBC: 4.46 MIL/uL (ref 3.87–5.11)

## 2013-06-26 LAB — URINALYSIS, ROUTINE W REFLEX MICROSCOPIC
Ketones, ur: NEGATIVE mg/dL
Leukocytes, UA: NEGATIVE
Nitrite: NEGATIVE
Protein, ur: NEGATIVE mg/dL
pH: 5.5 (ref 5.0–8.0)

## 2013-06-26 LAB — BASIC METABOLIC PANEL
CO2: 26 mEq/L (ref 19–32)
Glucose, Bld: 162 mg/dL — ABNORMAL HIGH (ref 70–99)
Potassium: 3.9 mEq/L (ref 3.5–5.1)
Sodium: 138 mEq/L (ref 135–145)

## 2013-06-26 MED ORDER — HYDROMORPHONE HCL PF 1 MG/ML IJ SOLN
1.0000 mg | Freq: Once | INTRAMUSCULAR | Status: AC
  Administered 2013-06-26: 1 mg via INTRAVENOUS
  Filled 2013-06-26: qty 1

## 2013-06-26 MED ORDER — CEPHALEXIN 500 MG PO CAPS: 500.0000 mg | ORAL_CAPSULE | Freq: Four times a day (QID) | ORAL | Status: DC

## 2013-06-26 MED ORDER — ONDANSETRON HCL 4 MG/2ML IJ SOLN
4.0000 mg | Freq: Once | INTRAMUSCULAR | Status: AC
  Administered 2013-06-26: 4 mg via INTRAVENOUS
  Filled 2013-06-26: qty 2

## 2013-06-26 MED ORDER — OXYCODONE-ACETAMINOPHEN 5-325 MG PO TABS: 1.0000 | ORAL_TABLET | ORAL | Status: DC | PRN

## 2013-06-26 NOTE — ED Notes (Signed)
Pt alert & oriented x4, stable gait. Patient given discharge instructions, paperwork & prescription(s). Patient  instructed to stop at the registration desk to finish any additional paperwork. Patient verbalized understanding. Pt left department w/ no further questions. 

## 2013-06-26 NOTE — ED Notes (Addendum)
States that she is having left flank and left lower back pain.  States that she has nausea.  States that this has been present x1 week.  States that her urine appears to be a dark color.

## 2013-06-26 NOTE — ED Notes (Signed)
Pt c/o left side flank pain that radiates to lower abdomen and groin area. Pt denies dysuria and polyuria. Pt reports nausea but denies vomiting and diarrhea.

## 2013-06-26 NOTE — ED Provider Notes (Signed)
CSN: [id number]     Arrival date & time 06/26/13  1328 History  This chart was scribed for Joya Gaskins, MD by Bennett Scrape, ED Scribe. This patient was seen in room APA14/APA14 and the patient's care was started at 3:22 PM.   First MD Initiated Contact with Patient 06/26/13 1457     Chief Complaint  Patient presents with  . Flank Pain  . Back Pain  . Nausea    Patient is a 32 y.o. female presenting with flank pain. The history is provided by the patient. No language interpreter was used.  Flank Pain Pertinent negatives include no shortness of breath.    HPI Comments: Anne [name] is a 32 y.o. female who presents to the Emergency Department complaining of more than one week of back pain that she attributed to a sciatic nerve pain. She took naprosyn with improvement. She became concerned when the pain migrated up into her left flank  and began radiating into her left groin area. She describes the pain as a pressure like cramp with associated nausea and darker than normal urine. She was seen in the ED and was given antibiotics for a kidney infection. She states that she took AZO pills with no improvement. She reports that the pain was at its most severe 2 days ago. She states that she has been drinking normally since the onset. The symptoms are worsened with standing. She reports a "smoker's cough" but denies fevers, dysuria and emesis. Stopped taking metformin due to diarrhea. Blood sugar this morning was 247. She reports LUQ pain with coughing. She is currently on her menses. She denies kidney stone surgery.  Past Medical History  Diagnosis Date  . Diabetes mellitus   . Hypertension   . Acid reflux   . Diabetes mellitus type 1 10/31/2011  . Hypercholesteremia   . Sciatica    Past Surgical History  Procedure Laterality Date  . Appendectomy    . Cesarean section  2004 and 2007    x2  . Tubal ligation    . Amputation  11/03/2011    Procedure: AMPUTATION RAY;  Surgeon:  Dalia Heading;  Location: [facility];  Service: General;  Laterality: Right;  Right fourth and fifth metatarsal   . Tubal ligation     Family History  Problem Relation Age of Onset  . Anesthesia problems Neg Hx   . Hypotension Neg Hx   . Malignant hyperthermia Neg Hx   . Pseudochol deficiency Neg Hx    History  Substance Use Topics  . Smoking status: Current Every Day Smoker -- 0.25 packs/day for 20 years    Types: Cigarettes  . Smokeless tobacco: Never Used  . Alcohol Use: No   No OB history provided.  Review of Systems  Constitutional: Negative for fever and chills.  Respiratory: Negative for shortness of breath.   Gastrointestinal: Positive for nausea. Negative for vomiting.  Genitourinary: Positive for flank pain and vaginal bleeding (menses). Negative for dysuria, hematuria and vaginal discharge.  Musculoskeletal: Positive for back pain.  All other systems reviewed and are negative.    Allergies  Review of patient's allergies indicates no known allergies.  Home Medications   Current Outpatient Rx  Name  Route  Sig  Dispense  Refill  . cyclobenzaprine (FLEXERIL) 5 MG tablet   Oral   Take 1 tablet (5 mg total) by mouth 3 (three) times daily as needed. Muscle Spasms   30 tablet   3   . EXPIRED:  gabapentin (NEURONTIN) 300 MG capsule   Oral   Take 1 capsule (300 mg total) by mouth at bedtime.   30 capsule   3   . insulin aspart (NOVOLOG) 100 UNIT/ML injection   Subcutaneous   Inject 12 Units into the skin 3 (three) times daily before meals.         . insulin glargine (LANTUS) 100 UNIT/ML injection   Subcutaneous   Inject 45 Units into the skin at bedtime.          . insulin glargine (LANTUS) 100 UNIT/ML injection      INJECT S.Q 38 UNITS ONCE DAILY AT BEDTIME.   20 mL   3   . lisinopril (PRINIVIL,ZESTRIL) 5 MG tablet   Oral   Take 1 tablet (5 mg total) by mouth every morning.   30 tablet   3   . metFORMIN (GLUCOPHAGE) 1000 MG tablet   Oral   Take  1 tablet (1,000 mg total) by mouth 2 (two) times daily.   60 tablet   3   . naproxen (NAPROSYN) 500 MG tablet   Oral   Take 500 mg by mouth 2 (two) times daily.          Marland Kitchen NOVOLOG FLEXPEN 100 UNIT/ML injection      INJECT 6 UNITS S.Q. WITH LUNCH AND 8 UNITS S.Q. WITH SUPPER.   15 mL   3   . omeprazole (PRILOSEC) 20 MG capsule   Oral   Take 1 capsule (20 mg total) by mouth every morning.   30 capsule   3   . oxyCODONE-acetaminophen (PERCOCET) 7.5-325 MG per tablet   Oral   Take 1 tablet by mouth every 6 (six) hours as needed. FOR PAIN   60 tablet   0   . pravastatin (PRAVACHOL) 20 MG tablet   Oral   Take 1 tablet (20 mg total) by mouth every morning.   30 tablet   3    Triage Vitals: BP 152/97  Pulse 88  Temp(Src) 97.7 F (36.5 C) (Oral)  Resp 18  Ht 5\' 11"  (1.803 m)  Wt 210 lb (95.255 kg)  BMI 29.3 kg/m2  SpO2 99%  LMP 06/26/2013  Physical Exam  Nursing note and vitals reviewed.  CONSTITUTIONAL: Well developed/well nourished HEAD: Normocephalic/atraumatic EYES: EOMI/PERRL ENMT: Mucous membranes moist NECK: supple no meningeal signs SPINE:entire spine nontender CV: S1/S2 noted, no murmurs/rubs/gallops noted LUNGS: Lungs are clear to auscultation bilaterally, no apparent distress ABDOMEN: soft, mild LLQ tenderness, no rebound or guarding GU: left CVA tenderness NEURO: Pt is awake/alert, moves all extremitiesx4 EXTREMITIES: pulses normal, full ROM SKIN: warm, color normal PSYCH: no abnormalities of mood noted  ED Course   Procedures  DIAGNOSTIC STUDIES: Oxygen Saturation is 99% on room air, normal by my interpretation.     Labs Reviewed  URINALYSIS, ROUTINE W REFLEX MICROSCOPIC - Abnormal; Notable for the following:    Hgb urine dipstick LARGE (*)    All other components within normal limits  URINE MICROSCOPIC-ADD ON - Abnormal; Notable for the following:    Bacteria, UA FEW (*)    Casts GRANULAR CAST (*)    All other components within normal  limits  PREGNANCY, URINE    Pt with flank pain, ct imaging performed to r/o obstructive stone She may have pyelo, no stone noted abx ordered Advised of CT findings of hepatic steatosis/splenomegaly but this can managed as outpatient Stable and nontoxic appearing I doubt other acute abdominal/gynecologic process   MDM  Nursing notes including past medical history and social history reviewed and considered in documentation Labs/vital reviewed and considered    I personally performed the services described in this documentation, which was scribed in my presence. The recorded information has been reviewed and is accurate.      Joya Gaskins, MD 06/26/13 2209

## 2013-06-27 LAB — URINE CULTURE: Colony Count: 70000

## 2013-07-17 ENCOUNTER — Other Ambulatory Visit: Payer: Self-pay | Admitting: Family Medicine

## 2013-07-17 NOTE — Telephone Encounter (Signed)
yes

## 2014-02-20 ENCOUNTER — Emergency Department (HOSPITAL_COMMUNITY)
Admission: EM | Admit: 2014-02-20 | Discharge: 2014-02-20 | Disposition: A | Discharge status: Home / Self Care | Payer: Medicaid Other | Attending: Emergency Medicine | Admitting: Emergency Medicine

## 2014-02-20 ENCOUNTER — Encounter (HOSPITAL_COMMUNITY): Payer: Self-pay | Admitting: Emergency Medicine

## 2014-02-20 ENCOUNTER — Emergency Department (HOSPITAL_COMMUNITY): Payer: Medicaid Other

## 2014-02-20 DIAGNOSIS — Z792 Long term (current) use of antibiotics: Secondary | ICD-10-CM | POA: Insufficient documentation

## 2014-02-20 DIAGNOSIS — I10 Essential (primary) hypertension: Secondary | ICD-10-CM | POA: Insufficient documentation

## 2014-02-20 DIAGNOSIS — Z79899 Other long term (current) drug therapy: Secondary | ICD-10-CM | POA: Insufficient documentation

## 2014-02-20 DIAGNOSIS — L03115 Cellulitis of right lower limb: Secondary | ICD-10-CM

## 2014-02-20 DIAGNOSIS — L02619 Cutaneous abscess of unspecified foot: Secondary | ICD-10-CM | POA: Insufficient documentation

## 2014-02-20 DIAGNOSIS — Z8719 Personal history of other diseases of the digestive system: Secondary | ICD-10-CM | POA: Insufficient documentation

## 2014-02-20 DIAGNOSIS — Z794 Long term (current) use of insulin: Secondary | ICD-10-CM | POA: Insufficient documentation

## 2014-02-20 DIAGNOSIS — E109 Type 1 diabetes mellitus without complications: Secondary | ICD-10-CM | POA: Insufficient documentation

## 2014-02-20 DIAGNOSIS — S98139A Complete traumatic amputation of one unspecified lesser toe, initial encounter: Secondary | ICD-10-CM | POA: Insufficient documentation

## 2014-02-20 DIAGNOSIS — Z87891 Personal history of nicotine dependence: Secondary | ICD-10-CM | POA: Insufficient documentation

## 2014-02-20 DIAGNOSIS — L03119 Cellulitis of unspecified part of limb: Principal | ICD-10-CM

## 2014-02-20 LAB — CBC WITH DIFFERENTIAL/PLATELET
Basophils Absolute: 0 10*3/uL (ref 0.0–0.1)
Basophils Relative: 0 % (ref 0–1)
EOS ABS: 0.2 10*3/uL (ref 0.0–0.7)
EOS PCT: 2 % (ref 0–5)
HEMATOCRIT: 38.8 % (ref 36.0–46.0)
Hemoglobin: 13.2 g/dL (ref 12.0–15.0)
LYMPHS ABS: 1.9 10*3/uL (ref 0.7–4.0)
LYMPHS PCT: 20 % (ref 12–46)
MCH: 30 pg (ref 26.0–34.0)
MCHC: 34 g/dL (ref 30.0–36.0)
MCV: 88.2 fL (ref 78.0–100.0)
MONO ABS: 0.4 10*3/uL (ref 0.1–1.0)
Monocytes Relative: 4 % (ref 3–12)
Neutro Abs: 7.1 10*3/uL (ref 1.7–7.7)
Neutrophils Relative %: 73 % (ref 43–77)
PLATELETS: 196 10*3/uL (ref 150–400)
RBC: 4.4 MIL/uL (ref 3.87–5.11)
RDW: 12.4 % (ref 11.5–15.5)
WBC: 9.6 10*3/uL (ref 4.0–10.5)

## 2014-02-20 LAB — CBG MONITORING, ED: GLUCOSE-CAPILLARY: 136 mg/dL — AB (ref 70–99)

## 2014-02-20 LAB — BASIC METABOLIC PANEL
BUN: 9 mg/dL (ref 6–23)
CHLORIDE: 100 meq/L (ref 96–112)
CO2: 26 mEq/L (ref 19–32)
Calcium: 9.5 mg/dL (ref 8.4–10.5)
Creatinine, Ser: 0.75 mg/dL (ref 0.50–1.10)
GFR calc non Af Amer: >90 mL/min (ref >90)
Glucose, Bld: 187 mg/dL — ABNORMAL HIGH (ref 70–99)
Potassium: 3.9 mEq/L (ref 3.7–5.3)
Sodium: 139 mEq/L (ref 137–147)

## 2014-02-20 MED ORDER — ONDANSETRON HCL 4 MG/2ML IJ SOLN
4.0000 mg | Freq: Once | INTRAMUSCULAR | Status: AC
  Administered 2014-02-20: 4 mg via INTRAVENOUS
  Filled 2014-02-20: qty 2

## 2014-02-20 MED ORDER — VANCOMYCIN HCL IN DEXTROSE 1-5 GM/200ML-% IV SOLN
1000.0000 mg | INTRAVENOUS | Status: AC
  Administered 2014-02-20: 1000 mg via INTRAVENOUS
  Filled 2014-02-20 (×2): qty 200

## 2014-02-20 MED ORDER — SULFAMETHOXAZOLE-TMP DS 800-160 MG PO TABS: 1.0000 | ORAL_TABLET | Freq: Two times a day (BID) | ORAL | Status: DC

## 2014-02-20 MED ORDER — SODIUM CHLORIDE 0.9 % IV BOLUS (SEPSIS)
1000.0000 mL | Freq: Once | INTRAVENOUS | Status: AC
  Administered 2014-02-20: 1000 mL via INTRAVENOUS

## 2014-02-20 MED ORDER — MORPHINE SULFATE 4 MG/ML IJ SOLN
4.0000 mg | Freq: Once | INTRAMUSCULAR | Status: AC
  Administered 2014-02-20: 4 mg via INTRAVENOUS
  Filled 2014-02-20: qty 1

## 2014-02-20 MED ORDER — OXYCODONE-ACETAMINOPHEN 5-325 MG PO TABS: 2.0000 | ORAL_TABLET | ORAL | Status: DC | PRN

## 2014-02-20 NOTE — Progress Notes (Signed)
ANTIBIOTIC CONSULT NOTE - INITIAL  Pharmacy Consult for Vancomycin Indication: cellulitis  No Known Allergies  Patient Measurements: Height: 5\' 11"  (180.3 cm) Weight: 211 lb (95.709 kg) IBW/kg (Calculated) : 70.8  Vital Signs: Temp: 97.9 F (36.6 C) (03/31 1552) Temp src: Oral (03/31 1552) BP: 126/75 mmHg (03/31 1826) Pulse Rate: 86 (03/31 1826) Intake/Output from previous day:   Intake/Output from this shift:    Labs:  Recent Labs  02/20/14 1619  WBC 9.6  HGB 13.2  PLT 196  CREATININE 0.75   Estimated Creatinine Clearance: 127.6 ml/min (by C-G formula based on Cr of 0.75). No results found for this basename: VANCOTROUGH, VANCOPEAK, VANCORANDOM, GENTTROUGH, GENTPEAK, GENTRANDOM, TOBRATROUGH, TOBRAPEAK, TOBRARND, AMIKACINPEAK, AMIKACINTROU, AMIKACIN,  in the last 72 hours   Microbiology: No results found for this or any previous visit (from the past 720 hour(s)).  Medical History: Past Medical History  Diagnosis Date  . Diabetes mellitus   . Hypertension   . Acid reflux   . Diabetes mellitus type 1 10/31/2011  . Hypercholesteremia   . Sciatica     Medications:  Scheduled:  .  morphine injection  4 mg Intravenous Once  . ondansetron (ZOFRAN) IV  4 mg Intravenous Once   Assessment: 33 yo F with cellulitis of rt great toe.  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  Renal function is good.   Goal of Therapy:  Vancomycin trough level 10-15 mcg/ml  Plan:  Vancomycin 2gm IV x1 loading dose  Anne [name] 02/20/2014,6:48 PM

## 2014-02-20 NOTE — ED Provider Notes (Signed)
CSN: [id number]     Arrival date & time 02/20/14  1541 History  This chart was scribed for Nat Christen, MD by Rolanda Lundborg, ED Scribe. This patient was seen in room APA12/APA12 and the patient's care was started at 5:30 PM.    Chief Complaint  Patient presents with  . Cellulitis     (Consider location/radiation/quality/duration/timing/severity/associated sxs/prior Treatment) The history is provided by the patient. No language interpreter was used.   HPI Comments: IYSHA MISHKIN is a 33 y.o. female with a h/o DM who presents to the Emergency Department complaining of cramping, redness, and swelling to the right great toe with redness extending into the RLE onset 2 days ago. She reports pus draining from the toe this morning. She took 500mg  Bactrim 2 tablets that she had leftover last night at 8pm. 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 She was seen by [facility] this morning and told to come here for x-ray. She denies fevers. She states her sugars have been running high in the 200s but it was 138 when she checked this morning.   Past Medical History  Diagnosis Date  . Diabetes mellitus   . Hypertension   . Acid reflux   . Diabetes mellitus type 1 10/31/2011  . Hypercholesteremia   . Sciatica    Past Surgical History  Procedure Laterality Date  . Appendectomy    . Cesarean section  2004 and 2007    x2  . Tubal ligation    . Amputation  11/03/2011    Procedure: AMPUTATION RAY;  Surgeon: Jamesetta So;  Location: [facility];  Service: General;  Laterality: Right;  Right fourth and fifth metatarsal   . Tubal ligation     Family History  Problem Relation Age of Onset  . Anesthesia problems Neg Hx   . Hypotension Neg Hx   . Malignant hyperthermia Neg Hx   . Pseudochol deficiency Neg Hx    History  Substance Use Topics  . Smoking status: Former Smoker -- 0.25 packs/day for 20 years    Types: Cigarettes    Quit date: 12/29/2013  . Smokeless  tobacco: Never Used  . Alcohol Use: No   OB History   Grav Para Term Preterm Abortions TAB SAB Ect Mult Living                 Review of Systems  Constitutional: Negative for fever.  Skin: Positive for color change.   A complete 10 system review of systems was obtained and all systems are negative except as noted in the HPI and PMH.     Allergies  Review of patient's allergies indicates no known allergies.  Home Medications   Current Outpatient Rx  Name  Route  Sig  Dispense  Refill  . insulin aspart (NOVOLOG FLEXPEN) 100 UNIT/ML FlexPen   Subcutaneous   Inject 18-35 Units into the skin 3 (three) times daily with meals. Based on sliding scale/blood sugar levels         . insulin glargine (LANTUS) 100 UNIT/ML injection   Subcutaneous   Inject 38 Units into the skin at bedtime.          Marland Kitchen OVER THE COUNTER MEDICATION   Oral   Take 1 tablet by mouth daily. MAGNESIUM PO (Strength is unknown)         . Potassium 99 MG TABS   Oral   Take 1 tablet by mouth daily.         Marland Kitchen  oxyCODONE-acetaminophen (PERCOCET) 5-325 MG per tablet   Oral   Take 2 tablets by mouth every 4 (four) hours as needed.   15 tablet   0   . sulfamethoxazole-trimethoprim (BACTRIM DS) 800-160 MG per tablet   Oral   Take 1 tablet by mouth 2 (two) times daily.   20 tablet   0    BP 157/96  Pulse 103  Temp(Src) 97.9 F (36.6 C) (Oral)  Resp 18  Ht 5\' 11"  (1.803 m)  Wt 211 lb (95.709 kg)  BMI 29.44 kg/m2  SpO2 100%  LMP 01/30/2014 Physical Exam  Nursing note and vitals reviewed. Constitutional: She is oriented to person, place, and time. She appears well-developed and well-nourished.  HENT:  Head: Normocephalic and atraumatic.  Eyes: Conjunctivae and EOM are normal. Pupils are equal, round, and reactive to light.  Neck: Normal range of motion. Neck supple.  Cardiovascular: Normal rate, regular rhythm and normal heart sounds.   Pulmonary/Chest: Effort normal and breath sounds normal.   Abdominal: Soft. Bowel sounds are normal.  Musculoskeletal: Normal range of motion.  Right foot around the great toe there is an area of erythema surrounding the MTP joint on the dorsum of the foot. On the plantar aspect of the foot there is a 2x1 cm area of erythema. On the anterior medial aspect of the ankle radiating to the calf there is an area of erythema.  Neurological: She is alert and oriented to person, place, and time.  Skin: Skin is warm and dry.  Psychiatric: She has a normal mood and affect. Her behavior is normal.    ED Course  Procedures (including critical care time) Medications  vancomycin (VANCOCIN) IVPB 1000 mg/200 mL premix (1,000 mg Intravenous New Bag/Given 02/20/14 1904)  sodium chloride 0.9 % bolus 1,000 mL (1,000 mLs Intravenous New Bag/Given 02/20/14 1850)  morphine 4 MG/ML injection 4 mg (4 mg Intravenous Given 02/20/14 1849)  ondansetron (ZOFRAN) injection 4 mg (4 mg Intravenous Given 02/20/14 1850)    DIAGNOSTIC STUDIES: Oxygen Saturation is 100% on RA, normal by my interpretation.    COORDINATION OF CARE: 6:01 PM- Discussed dealing with the cellulitis outpatient but there is a possibility of admission. Pt agrees to plan.    Labs Review Labs Reviewed  BASIC METABOLIC PANEL - Abnormal; Notable for the following:    Glucose, Bld 187 (*)    All other components within normal limits  CBG MONITORING, ED - Abnormal; Notable for the following:    Glucose-Capillary 136 (*)    All other components within normal limits  CBC WITH DIFFERENTIAL   Imaging Review Dg Foot Complete Right  02/20/2014   CLINICAL DATA:  Cellulitis, infection/possible on great toe, prior amputation 2 years ago, diabetes  EXAM: RIGHT FOOT COMPLETE - 3+ VIEW  COMPARISON:  [place] ANKLE/BRACHIAL INDICES BILAT dated 09/14/2012; DG FOOT COMPLETE*R* dated 10/30/2011; DG FOOT COMPLETE*R* dated 10/27/2011  FINDINGS: The first through third toes remain. Patient is status post amputation of the fourth toes  beyond the mid shafts of the metatarsals.  There is moderately severe degenerative change of the third metatarsal phalangeal joint with osteophyte formation and deformity of the joint, without periosteal reaction. This may be due to altered mechanics and proprioception. More proximally, there is chronic appearing cortical thickening involving the proximal to mid shaft of the third metatarsal which may represent stress reaction. Additionally, there is a wide horizontal lucent defect through the proximal third of the metatarsal shaft.  There is soft tissue swelling over the  proximal and distal phalanx of the great toe. There is no periosteal reaction. There is no cortical destruction.  IMPRESSION: Soft tissue swelling great toe with no radiographic evidence of osteomyelitis involving the great toe.  Chronic appearing abnormality involving third metatarsal including evidence of stress reaction and fracture, also likely chronic, involving the proximal shaft.   Electronically Signed   By: Skipper Cliche M.D.   On: 02/20/2014 16:28     EKG Interpretation None      MDM   Final diagnoses:  Cellulitis of foot, right    Patient has cellulitis of right foot. She is also diabetic. IV vancomycin. Discharge medications Septra DS. Patient understands to return if worse in any way.  I personally performed the services described in this documentation, which was scribed in my presence. The recorded information has been reviewed and is accurate.    Nat Christen, MD 02/20/14 [phone]

## 2014-02-20 NOTE — ED Notes (Signed)
Mistakenly pulled IV and patient was supposed to get 2 grams of VANC. MD aware. IV restarted.

## 2014-02-20 NOTE — ED Notes (Signed)
Swelling, redness, rt great toe with redness into leg.   Has had amputations of  Toes on same foot.

## 2014-02-20 NOTE — Discharge Instructions (Signed)
Soak foot in warm water. Keep clean. Elevate foot. Antibiotic twice a day. Return if worse in any way

## 2014-06-10 ENCOUNTER — Encounter (HOSPITAL_COMMUNITY): Payer: Self-pay | Admitting: Emergency Medicine

## 2014-06-10 ENCOUNTER — Emergency Department (HOSPITAL_COMMUNITY)
Admission: EM | Admit: 2014-06-10 | Discharge: 2014-06-11 | Disposition: A | Discharge status: Home / Self Care | Payer: Medicaid Other | Attending: Emergency Medicine | Admitting: Emergency Medicine

## 2014-06-10 DIAGNOSIS — Y9229 Other specified public building as the place of occurrence of the external cause: Secondary | ICD-10-CM | POA: Insufficient documentation

## 2014-06-10 DIAGNOSIS — S335XXA Sprain of ligaments of lumbar spine, initial encounter: Secondary | ICD-10-CM | POA: Insufficient documentation

## 2014-06-10 DIAGNOSIS — S46911A Strain of unspecified muscle, fascia and tendon at shoulder and upper arm level, right arm, initial encounter: Secondary | ICD-10-CM

## 2014-06-10 DIAGNOSIS — Z8739 Personal history of other diseases of the musculoskeletal system and connective tissue: Secondary | ICD-10-CM | POA: Diagnosis not present

## 2014-06-10 DIAGNOSIS — S8001XA Contusion of right knee, initial encounter: Secondary | ICD-10-CM

## 2014-06-10 DIAGNOSIS — E109 Type 1 diabetes mellitus without complications: Secondary | ICD-10-CM | POA: Diagnosis not present

## 2014-06-10 DIAGNOSIS — Z8719 Personal history of other diseases of the digestive system: Secondary | ICD-10-CM | POA: Diagnosis not present

## 2014-06-10 DIAGNOSIS — Z794 Long term (current) use of insulin: Secondary | ICD-10-CM | POA: Diagnosis not present

## 2014-06-10 DIAGNOSIS — Y9389 Activity, other specified: Secondary | ICD-10-CM | POA: Diagnosis not present

## 2014-06-10 DIAGNOSIS — Z87891 Personal history of nicotine dependence: Secondary | ICD-10-CM | POA: Diagnosis not present

## 2014-06-10 DIAGNOSIS — W19XXXA Unspecified fall, initial encounter: Secondary | ICD-10-CM

## 2014-06-10 DIAGNOSIS — Z79899 Other long term (current) drug therapy: Secondary | ICD-10-CM | POA: Diagnosis not present

## 2014-06-10 DIAGNOSIS — S8000XA Contusion of unspecified knee, initial encounter: Secondary | ICD-10-CM | POA: Diagnosis not present

## 2014-06-10 DIAGNOSIS — Z792 Long term (current) use of antibiotics: Secondary | ICD-10-CM | POA: Insufficient documentation

## 2014-06-10 DIAGNOSIS — S99919A Unspecified injury of unspecified ankle, initial encounter: Secondary | ICD-10-CM | POA: Diagnosis present

## 2014-06-10 DIAGNOSIS — S39012A Strain of muscle, fascia and tendon of lower back, initial encounter: Secondary | ICD-10-CM

## 2014-06-10 DIAGNOSIS — I10 Essential (primary) hypertension: Secondary | ICD-10-CM | POA: Diagnosis not present

## 2014-06-10 DIAGNOSIS — IMO0002 Reserved for concepts with insufficient information to code with codable children: Secondary | ICD-10-CM | POA: Diagnosis not present

## 2014-06-10 DIAGNOSIS — S8990XA Unspecified injury of unspecified lower leg, initial encounter: Secondary | ICD-10-CM | POA: Diagnosis present

## 2014-06-10 DIAGNOSIS — R296 Repeated falls: Secondary | ICD-10-CM | POA: Diagnosis not present

## 2014-06-10 MED ORDER — HYDROCODONE-ACETAMINOPHEN 5-325 MG PO TABS
2.0000 | ORAL_TABLET | Freq: Once | ORAL | Status: AC
  Administered 2014-06-11: 2 via ORAL
  Filled 2014-06-10: qty 2

## 2014-06-10 MED ORDER — KETOROLAC TROMETHAMINE 60 MG/2ML IM SOLN
60.0000 mg | Freq: Once | INTRAMUSCULAR | Status: AC
  Administered 2014-06-11: 60 mg via INTRAMUSCULAR
  Filled 2014-06-10: qty 2

## 2014-06-10 NOTE — ED Notes (Signed)
EDP at bedside  

## 2014-06-10 NOTE — ED Provider Notes (Signed)
CSN: [id number]     Arrival date & time 06/10/14  2321 History  This chart was scribed for Johnna Acosta, MD by Steva Colder, ED Scribe. The patient was seen in room APA01/APA01 at 11:41 PM.    Chief Complaint  Patient presents with  . Fall    HPI HPI Comments: Anne [name] is a 33 y.o. female who presents to the Emergency Department complaining of a fall earlier tonight. She states that she fell inside [employer] while school shopping around 6 PM today. She states that she pulled some clothes off the rack and she walked into the post and tried to catch herself. She states that she went down and fell with her knees. She states that she hit her head on the table. She states that she is having associated symptoms of shoulder pain and lower back pain. She states that she went to [facility] ED originally earlier today. She states that her BP was high at [place]. She states that she was given 5 mg Diazepam and IBU. She states that she did not have any X-Rays completed. She voices concerns about not having any X-Rays completed while at [facility] ED. She states that she has nephropathy. She states that she has broken bones before. She denies any other associated symptoms.   Past Medical History  Diagnosis Date  . Diabetes mellitus   . Hypertension   . Acid reflux   . Diabetes mellitus type 1 10/31/2011  . Hypercholesteremia   . Sciatica    Past Surgical History  Procedure Laterality Date  . Appendectomy    . Cesarean section  2004 and 2007    x2  . Tubal ligation    . Amputation  11/03/2011    Procedure: AMPUTATION RAY;  Surgeon: Jamesetta So;  Location: [facility];  Service: General;  Laterality: Right;  Right fourth and fifth metatarsal   . Tubal ligation     Family History  Problem Relation Age of Onset  . Anesthesia problems Neg Hx   . Hypotension Neg Hx   . Malignant hyperthermia Neg Hx   . Pseudochol deficiency Neg Hx    History  Substance Use Topics  . Smoking  status: Former Smoker -- 0.25 packs/day for 20 years    Types: Cigarettes    Quit date: 12/29/2013  . Smokeless tobacco: Never Used  . Alcohol Use: No   OB History   Grav Para Term Preterm Abortions TAB SAB Ect Mult Living                 Review of Systems  Musculoskeletal: Positive for back pain (lower).       Right shoulder pain.       Allergies  Review of patient's allergies indicates no known allergies.  Home Medications   Prior to Admission medications   Medication Sig Start Date End Date Taking? Authorizing Provider  HYDROcodone-acetaminophen (NORCO/VICODIN) 5-325 MG per tablet Take 2 tablets by mouth every 4 (four) hours as needed. 06/11/14   Johnna Acosta, MD  insulin aspart (NOVOLOG FLEXPEN) 100 UNIT/ML FlexPen Inject 18-35 Units into the skin 3 (three) times daily with meals. Based on sliding scale/blood sugar levels    Historical Provider, MD  insulin glargine (LANTUS) 100 UNIT/ML injection Inject 38 Units into the skin at bedtime.  03/10/12   Alycia Rossetti, MD  naproxen (NAPROSYN) 500 MG tablet Take 1 tablet (500 mg total) by mouth 2 (two) times daily with a meal.  06/11/14   Johnna Acosta, MD  OVER THE COUNTER MEDICATION Take 1 tablet by mouth daily. MAGNESIUM PO (Strength is unknown)    Historical Provider, MD  oxyCODONE-acetaminophen (PERCOCET) 5-325 MG per tablet Take 2 tablets by mouth every 4 (four) hours as needed. 02/20/14   Nat Christen, MD  Potassium 99 MG TABS Take 1 tablet by mouth daily.    Historical Provider, MD  sulfamethoxazole-trimethoprim (BACTRIM DS) 800-160 MG per tablet Take 1 tablet by mouth 2 (two) times daily. 02/20/14   Nat Christen, MD   BP 181/117  Pulse 114  Temp(Src) 98.7 F (37.1 C) (Oral)  Resp 20  Ht 5\' 10"  (1.778 m)  Wt 215 lb (97.523 kg)  BMI 30.85 kg/m2  SpO2 98%  LMP 06/07/2014  Physical Exam  Nursing note and vitals reviewed. Constitutional: She appears well-developed and well-nourished. No distress.  HENT:  Head:  Normocephalic and atraumatic.  Mouth/Throat: Oropharynx is clear and moist. No oropharyngeal exudate.  Eyes: Conjunctivae and EOM are normal. Pupils are equal, round, and reactive to light. Right eye exhibits no discharge. Left eye exhibits no discharge. No scleral icterus.  Neck: Normal range of motion. Neck supple. No JVD present. No thyromegaly present.  Cardiovascular: Normal rate, regular rhythm, normal heart sounds and intact distal pulses.  Exam reveals no gallop and no friction rub.   No murmur heard. Pulmonary/Chest: Effort normal and breath sounds normal. No respiratory distress. She has no wheezes. She has no rales.  Abdominal: Soft. Bowel sounds are normal. She exhibits no distension and no mass. There is no tenderness.  Musculoskeletal: Normal range of motion. She exhibits no edema and no tenderness.  ttp over the Distal trap on her right shoulder. And the R patella with small abrasion present.  Normal ROM of all joints of the UE and LE's.  Minimal ttp over the bilaeral lower back  Lymphadenopathy:    She has no cervical adenopathy.  Neurological: She is alert. Coordination normal.  Skin: Skin is warm and dry. No rash noted. No erythema.  1 cm abrasion  to the left knee.   Psychiatric: She has a normal mood and affect. Her behavior is normal.    ED Course  Procedures (including critical care time) DIAGNOSTIC STUDIES: Oxygen Saturation is 98% on room air, normal by my interpretation.    COORDINATION OF CARE: 11:48 PM-Discussed treatment plan which includes Toradol and Hydrocodone with pt at bedside and pt agreed to plan.   Labs Review Labs Reviewed - No data to display  Imaging Review No results found.    MDM   Final diagnoses:  Contusion of knee, right, initial encounter  Lumbar strain, initial encounter  Shoulder strain, right, initial encounter  Fall, initial encounter  Essential hypertension    No sig signs of trauma - pt does not need imaging - better pain  control offered and accepted, pt stable for d/c to f/u with PMD as needed.  Meds given in ED:  Medications  ketorolac (TORADOL) injection 60 mg (60 mg Intramuscular Given 06/11/14 0006)  HYDROcodone-acetaminophen (NORCO/VICODIN) 5-325 MG per tablet 2 tablet (2 tablets Oral Given 06/11/14 0006)    Discharge Medication List as of 06/11/2014 12:17 AM        I personally performed the services described in this documentation, which was scribed in my presence. The recorded information has been reviewed and is accurate.     Johnna Acosta, MD 06/11/14 [phone]

## 2014-06-10 NOTE — ED Notes (Signed)
Patient states she fell earlier tonight and was seen at [facility] ED and discharged.  Patient states he BP has been up and that she is still in pain and did not have any xrays at [place].  Patient c/o right shoulder and lower back pain.

## 2014-06-11 MED ORDER — HYDROCODONE-ACETAMINOPHEN 5-325 MG PO TABS: 2.0000 | ORAL_TABLET | ORAL | Status: DC | PRN

## 2014-06-11 MED ORDER — NAPROXEN 500 MG PO TABS: 500.0000 mg | ORAL_TABLET | Freq: Two times a day (BID) | ORAL | Status: DC

## 2014-06-11 NOTE — Discharge Instructions (Signed)
Your blood pressure has been elevated, and you must continue to take her medications exactly as prescribed by her doctor, you may need additional medications added if her blood pressure remains elevated.  Pain medications as prescribed

## 2014-09-07 ENCOUNTER — Other Ambulatory Visit: Payer: Self-pay

## 2014-10-16 ENCOUNTER — Other Ambulatory Visit (HOSPITAL_COMMUNITY): Payer: Self-pay | Admitting: Orthopaedic Surgery

## 2014-10-16 DIAGNOSIS — M542 Cervicalgia: Secondary | ICD-10-CM

## 2014-10-23 ENCOUNTER — Ambulatory Visit (HOSPITAL_COMMUNITY)
Admission: RE | Admit: 2014-10-23 | Discharge: 2014-10-23 | Disposition: A | Discharge status: Home / Self Care | Payer: Medicaid Other | Source: Ambulatory Visit | Attending: Orthopaedic Surgery | Admitting: Orthopaedic Surgery

## 2014-10-23 ENCOUNTER — Encounter (HOSPITAL_COMMUNITY): Payer: Self-pay

## 2014-10-23 DIAGNOSIS — M5022 Other cervical disc displacement, mid-cervical region: Secondary | ICD-10-CM | POA: Insufficient documentation

## 2014-10-23 DIAGNOSIS — M25511 Pain in right shoulder: Secondary | ICD-10-CM | POA: Diagnosis not present

## 2014-10-23 DIAGNOSIS — M542 Cervicalgia: Secondary | ICD-10-CM | POA: Diagnosis present

## 2014-10-23 DIAGNOSIS — M79601 Pain in right arm: Secondary | ICD-10-CM | POA: Diagnosis not present

## 2014-10-23 DIAGNOSIS — M4802 Spinal stenosis, cervical region: Secondary | ICD-10-CM | POA: Insufficient documentation

## 2014-11-30 ENCOUNTER — Ambulatory Visit (HOSPITAL_COMMUNITY)
Admission: RE | Admit: 2014-11-30 | Discharge: 2014-11-30 | Disposition: A | Discharge status: Home / Self Care | Payer: Medicaid Other | Source: Ambulatory Visit | Attending: Internal Medicine | Admitting: Internal Medicine

## 2014-11-30 DIAGNOSIS — I10 Essential (primary) hypertension: Secondary | ICD-10-CM | POA: Insufficient documentation

## 2014-11-30 DIAGNOSIS — E119 Type 2 diabetes mellitus without complications: Secondary | ICD-10-CM | POA: Insufficient documentation

## 2014-11-30 DIAGNOSIS — S91101D Unspecified open wound of right great toe without damage to nail, subsequent encounter: Secondary | ICD-10-CM | POA: Diagnosis present

## 2014-11-30 DIAGNOSIS — T148XXA Other injury of unspecified body region, initial encounter: Secondary | ICD-10-CM

## 2014-11-30 NOTE — Therapy (Signed)
[place], [place], [address] Phone: [phone]   Fax:  [phone]  Wound Care Evaluation  Patient Details  Name: Anne [name] MRN: [id number] Date of Birth: Nov 02, 1981 Referring Provider:  Jacqulyn [name],*  Encounter Date: 11/30/2014    Past Medical History  Diagnosis Date  . Diabetes mellitus   . Hypertension   . Acid reflux   . Diabetes mellitus type 1 10/31/2011  . Hypercholesteremia   . Sciatica     Past Surgical History  Procedure Laterality Date  . Appendectomy    . Cesarean section  2004 and 2007    x2  . Tubal ligation    . Amputation  11/03/2011    Procedure: AMPUTATION RAY;  Surgeon: Anne [name];  Location: [facility];  Service: General;  Laterality: Right;  Right fourth and fifth metatarsal   . Tubal ligation      There were no vitals taken for this visit.  Visit Diagnosis:  No diagnosis found.      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  She had an open sore on her Big toe in 2013 and went to the wound center in [place].  She states that she was out in the rain with her tennis shoes and allowed her feet to get wet when she noticed that her incision had broken open.  She has been cleansing and dressing the wound using neosporin but the wound will not heal.  She has been referred to PT    Patient and Family Stated Goals Wound to heal    Date of Onset 11/14/14   Prior Treatments self cleansing    Pain Assessment No/denies pain   Evaluation and Treatment Procedures Explained to Patient/Family Yes   Evaluation and Treatment Procedures agreed to   Incision Properties Date First Assessed: 11/30/14 Time First Assessed: 1113 Location: Foot   Dressing Type --  honey, 2x2; 3" kling witn netting.   Dressing Changed    Site / Wound Assessment Dry;Other (Comment)  calloused   Incision Length (cm) --  Length.3, width-1.3;depth .6; tunnels ant. .3; medial .7;    Margins --  tunnels posteriorly.5; and laterally .3    Closure None   Drainage Amount Minimal   Drainage Description Serous   Treatment Cleansed  debrided with sissors and forceps (callous area) followed by dressing with honey, 2x2 and kling.    Wound Therapy - Clinical Statement Pt is a 34 yo diabetic with a non-healing wound.  She states her sugars are not bad (in the 250 range). Pt educated in the importance of keeping wound clean, keeping sugar level down to allow healing.  Pt will follow up at the wound center as pt insurance does not cover therapy for wound care.    Factors Delaying/Impairing Wound Healing Altered sensation;Diabetes Mellitus   Wound Therapy - Current Recommendations Other (comment)   Wound Therapy - Follow Up Recommendations [place]   Wound Plan Discharge pt with home instructions to cleans moisturize and dress with honey everyday.    Patient/Family will be able to - one time treatment.  1)verbalize the importance stable glucose levels have in wound healing. 2) pt to verbalize the importance of keeping her  appointment at the wound center.     Patient/Family Instruction Goal - Progress Partly met              PT Education - 11/30/14 1505    Education provided Yes   Education Details the importance of stable glucose levels;   Person(s) Educated Patient   Methods Explanation   Comprehension Verbalized understanding         Problem List Patient Active Problem List   Diagnosis Date Noted  . Multiple nevi 10/07/2012  . Toe ulcer 03/11/2012  . Essential hypertension, benign 03/11/2012  . Type II diabetes mellitus 02/11/2012  . Diabetic neuropathy 02/11/2012  . Back pain 02/11/2012  . Muscle spasm 02/11/2012  . Hyperlipidemia 02/11/2012  . Obesity 02/11/2012  . Cellulitis in diabetic foot 10/31/2011     Anne [name],Anne [name] PT 11/30/2014, 3:07 PM  [place], [place], [address] Phone: [phone]   Fax:  [phone]

## 2014-12-06 ENCOUNTER — Encounter (HOSPITAL_COMMUNITY): Payer: Self-pay | Admitting: General Surgery

## 2015-02-21 ENCOUNTER — Ambulatory Visit: Payer: Medicaid Other | Admitting: Nutrition

## 2015-05-20 ENCOUNTER — Other Ambulatory Visit: Payer: Self-pay

## 2015-05-29 ENCOUNTER — Telehealth: Payer: Self-pay | Admitting: Nutrition

## 2015-05-29 NOTE — Telephone Encounter (Signed)
Called and left vm to return call to reschedule missed appointment. PC

## 2015-07-25 ENCOUNTER — Encounter: Payer: Self-pay | Admitting: Nutrition

## 2015-07-25 ENCOUNTER — Encounter: Payer: Medicaid Other | Attending: Family Medicine | Admitting: Nutrition

## 2015-07-25 VITALS — Ht 70.0 in | Wt 218.0 lb

## 2015-07-25 DIAGNOSIS — E118 Type 2 diabetes mellitus with unspecified complications: Secondary | ICD-10-CM | POA: Diagnosis present

## 2015-07-25 DIAGNOSIS — Z794 Long term (current) use of insulin: Secondary | ICD-10-CM | POA: Insufficient documentation

## 2015-07-25 DIAGNOSIS — Z713 Dietary counseling and surveillance: Secondary | ICD-10-CM | POA: Insufficient documentation

## 2015-07-25 DIAGNOSIS — E1165 Type 2 diabetes mellitus with hyperglycemia: Secondary | ICD-10-CM

## 2015-07-25 DIAGNOSIS — IMO0002 Reserved for concepts with insufficient information to code with codable children: Secondary | ICD-10-CM

## 2015-07-25 NOTE — Progress Notes (Signed)
  Medical Nutrition Therapy:  Appt start time: 1400 end time:  1610.  Assessment:  Primary concerns today: Diabetes Type 2.. LIves with her husband and kids. 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 She has a cut on her left hand in bandage with a plastic glove over her hand. She notes she cut her hand the other day. .Most recent A1C was close to 9% she thinks. Missed follow up appointment with Dr. Dorris Fetch.  She does the cooking and shopping at home. Most foods are fried and baked. She is trying to eat more fresh fruits and vegetables. Eating onlyl 1 meal per day.Marland Kitchen Physical activity: limited due to amputation and not feeling well. 60 units of Lantus at night, 15 units of Novolog with meals but only eats 1 meal per day and Invokana and 500 mg of Metformin BID. Complains of diarrhea with metformin but was taking it on an empty stomach. Admits to not compliance with diet and medications. She is out of Novolog but has Lantus and Invokana. Sample of Novolog given. To get her labs drawn Monday am and schedule follow up appointment with Dr. Dorris Fetch.  Diet and medication non compliance.  Preferred Learning Style:   No preference indicated   Learning Readiness:  Ready  Change in progress  MEDICATIONS: See list   DIETARY INTAKE:  24-hr recall:  B ( AM): Coffee Snk ( AM): L ( PM): Unsweet tea plain  Snk ( PM): D ( PM): Midnight: steak, 2 potatoes, and hot dogs 3 without buns,  Coffee Snk ( PM): Unsweet tea Beverages: 6- 16 oz of water per day plus unsweet tea Usual physical activity: ADL  Estimated energy needs: 1600 calories 180 g carbohydrates 120 g protein 44 g fat  Progress Towards Goal(s):  In progress.   Nutritional Diagnosis:  NB-1.1 Food and nutrition-related knowledge deficit related to diabetes as evidenced by A1C >9%.    Intervention:  Nutrition and diabetes education provided on disease, CHO counting, meal planning, portion sizes, low fat low sodium high fiber diet,  importance of medication compliance, signs/symptoms of hyper/hypoglycmeia and treatment of both, using insulin properly and importance of taking Novolog before meals after testing and correcting for elevated blood sugars, and complications related to uncontrolled DM. Importance of foot care, eye and dental care. Stressed need to not eat 1 meal per day..needs three balanced meals and timing of meals discussed.  Goals:  1. Follow My Plate Method 2. Eat three meals per day and do not skip meals. 3. Increase fresh fruits and vegetables. 4. Drink 5-6 bottles of water per day. 5. Take medications as prescribed. Test blood sugars before meals, give correct Novolog with meals using sliding scale and then eat within 5 minutes of injecting insulin.. 6. Test blood sugars before each meal and correct for blood sugar based on sliding scale. 7. Get A1C down to 7% in three months.  Teaching Method Utilized:  Visual Auditory Hands on  Handouts given during visit include:  The Plate Method  Meal Plan Card  Diabetes instructions    Barriers to learning/adherence to lifestyle change: None  Demonstrated degree of understanding via:  Teach Back   Monitoring/Evaluation:  Dietary intake, exercise, meal planning, SBG, and body weight in 1 month(s).

## 2015-07-26 NOTE — Patient Instructions (Signed)
Goals:  1. Follow My Plate Method 2. Eat three meals per day and do not skip meals. 3. Increase fresh fruits and vegetables. 4. Drink 5-6 bottles of water per day. 5. Take medications as prescribed. Test blood sugars before meals, give correct Novolog with meals using sliding scale and then eat within 5 minutes of injecting insulin.. 6. Test blood sugars before each meal and correct for blood sugar based on sliding scale. 7. Get A1C down to 7% in three months.

## 2015-08-03 LAB — HEMOGLOBIN A1C: HEMOGLOBIN A1C: 12 % — AB (ref 4.0–6.0)

## 2015-09-05 ENCOUNTER — Ambulatory Visit: Payer: Medicaid Other | Admitting: Nutrition

## 2015-09-11 ENCOUNTER — Ambulatory Visit: Payer: Medicaid Other | Admitting: Nutrition

## 2015-10-04 ENCOUNTER — Ambulatory Visit: Payer: Medicaid Other | Admitting: "Endocrinology

## 2015-10-07 ENCOUNTER — Other Ambulatory Visit: Payer: Self-pay | Admitting: "Endocrinology

## 2015-10-28 ENCOUNTER — Ambulatory Visit (INDEPENDENT_AMBULATORY_CARE_PROVIDER_SITE_OTHER): Payer: Medicaid Other | Admitting: "Endocrinology

## 2015-10-28 ENCOUNTER — Encounter: Payer: Self-pay | Admitting: "Endocrinology

## 2015-10-28 VITALS — BP 160/84 | HR 81 | Ht 67.5 in | Wt 209.0 lb

## 2015-10-28 DIAGNOSIS — E1159 Type 2 diabetes mellitus with other circulatory complications: Secondary | ICD-10-CM

## 2015-10-28 DIAGNOSIS — I10 Essential (primary) hypertension: Secondary | ICD-10-CM | POA: Diagnosis not present

## 2015-10-28 DIAGNOSIS — Z9119 Patient's noncompliance with other medical treatment and regimen: Secondary | ICD-10-CM | POA: Diagnosis not present

## 2015-10-28 DIAGNOSIS — E785 Hyperlipidemia, unspecified: Secondary | ICD-10-CM | POA: Diagnosis not present

## 2015-10-28 DIAGNOSIS — Z91199 Patient's noncompliance with other medical treatment and regimen due to unspecified reason: Secondary | ICD-10-CM | POA: Insufficient documentation

## 2015-10-28 MED ORDER — INSULIN ASPART 100 UNIT/ML FLEXPEN: 10.0000 [IU] | PEN_INJECTOR | Freq: Three times a day (TID) | SUBCUTANEOUS | Status: DC

## 2015-10-28 MED ORDER — METFORMIN HCL 500 MG PO TABS: 500.0000 mg | ORAL_TABLET | Freq: Two times a day (BID) | ORAL | Status: DC

## 2015-10-28 MED ORDER — INSULIN GLARGINE 100 UNIT/ML SOLOSTAR PEN: 40.0000 [IU] | PEN_INJECTOR | Freq: Every day | SUBCUTANEOUS | Status: DC

## 2015-10-28 MED ORDER — ACCU-CHEK AVIVA DEVI: Status: DC

## 2015-10-28 MED ORDER — OMEGA-3-ACID ETHYL ESTERS 1 G PO CAPS: 2.0000 g | ORAL_CAPSULE | Freq: Two times a day (BID) | ORAL | Status: DC

## 2015-10-28 MED ORDER — CANAGLIFLOZIN 100 MG PO TABS: 100.0000 mg | ORAL_TABLET | Freq: Every day | ORAL | Status: DC

## 2015-10-28 NOTE — Patient Instructions (Signed)

## 2015-10-28 NOTE — Progress Notes (Signed)
Subjective:    Patient ID: Anne [name], female    DOB: 09-Feb-1981, PCP CLAGGETT,ELIN, PA-C   Past Medical History  Diagnosis Date  . Diabetes mellitus   . Hypertension   . Acid reflux   . Diabetes mellitus type 1 ([place]) 10/31/2011  . Hypercholesteremia   . Sciatica    Past Surgical History  Procedure Laterality Date  . Appendectomy    . Cesarean section  2004 and 2007    x2  . Tubal ligation    . Amputation  11/03/2011    Procedure: AMPUTATION RAY;  Surgeon: Jamesetta So;  Location: [facility];  Service: General;  Laterality: Right;  Right fourth and fifth metatarsal   . Tubal ligation     Social History   Social History  . Marital Status: Married    Spouse Name: N/A  . Number of Children: N/A  . Years of Education: N/A   Social History Main Topics  . Smoking status: Former Smoker -- 0.25 packs/day for 20 years    Types: Cigarettes    Quit date: 12/29/2013  . Smokeless tobacco: Never Used  . Alcohol Use: No  . Drug Use: No  . Sexual Activity: Yes    Birth Control/ Protection: Surgical   Other Topics Concern  . None   Social History Narrative   Outpatient Encounter Prescriptions as of 10/28/2015  Medication Sig  . Blood Glucose Monitoring Suppl (ACCU-CHEK AVIVA) device Use as instructed  . canagliflozin (INVOKANA) 100 MG TABS tablet Take 1 tablet (100 mg total) by mouth daily with breakfast.  . cholecalciferol (VITAMIN D) 400 UNITS TABS tablet Take 400 Units by mouth.  Marland Kitchen HYDROcodone-acetaminophen (NORCO/VICODIN) 5-325 MG per tablet Take 2 tablets by mouth every 4 (four) hours as needed.  . insulin aspart (NOVOLOG FLEXPEN) 100 UNIT/ML FlexPen Inject 10-16 Units into the skin 3 (three) times daily with meals.  . Insulin Glargine (LANTUS SOLOSTAR) 100 UNIT/ML Solostar Pen Inject 40 Units into the skin daily at 10 pm.  . lisinopril (PRINIVIL,ZESTRIL) 20 MG tablet Take 20 mg by mouth daily.  . metFORMIN (GLUCOPHAGE) 500 MG tablet Take 1 tablet (500 mg total) by  mouth 2 (two) times daily with a meal.  . nabumetone (RELAFEN) 750 MG tablet Take 750 mg by mouth daily.  . naproxen (NAPROSYN) 500 MG tablet Take 1 tablet (500 mg total) by mouth 2 (two) times daily with a meal. (Patient not taking: Reported on 07/25/2015)  . omega-3 acid ethyl esters (LOVAZA) 1 G capsule Take 2 capsules (2 g total) by mouth 2 (two) times daily.  Marland Kitchen OVER THE COUNTER MEDICATION Take 1 tablet by mouth daily. MAGNESIUM PO (Strength is unknown)  . oxyCODONE-acetaminophen (PERCOCET) 5-325 MG per tablet Take 2 tablets by mouth every 4 (four) hours as needed.  . Potassium 99 MG TABS Take 1 tablet by mouth daily.  . pravastatin (PRAVACHOL) 40 MG tablet Take 40 mg by mouth daily.  . sertraline (ZOLOFT) 25 MG tablet Take 25 mg by mouth daily.  Marland Kitchen sulfamethoxazole-trimethoprim (BACTRIM DS) 800-160 MG per tablet Take 1 tablet by mouth 2 (two) times daily.  . [DISCONTINUED] canagliflozin (INVOKANA) 100 MG TABS tablet Take 100 mg by mouth.  . [DISCONTINUED] insulin aspart (NOVOLOG FLEXPEN) 100 UNIT/ML FlexPen Inject 10-16 Units into the skin 3 (three) times daily with meals.  . [DISCONTINUED] insulin glargine (LANTUS) 100 UNIT/ML injection Inject 38 Units into the skin at bedtime.   . [DISCONTINUED] metFORMIN (GLUCOPHAGE) 500 MG tablet Take  by mouth 2 (two) times daily with a meal.   No facility-administered encounter medications on file as of 10/28/2015.   ALLERGIES: No Known Allergies VACCINATION STATUS: Immunization History  Administered Date(s) Administered  . Influenza Split 08/20/2012  . Pneumococcal Polysaccharide-23 08/20/2012  . Tdap 04/27/2012    Diabetes She presents for her follow-up diabetic visit. She has type 2 diabetes mellitus. Onset time: She was diagnosed at approximate age of 48 years. Her disease course has been worsening. There are no hypoglycemic associated symptoms. Pertinent negatives for hypoglycemia include no confusion, headaches, pallor or seizures. Associated  symptoms include blurred vision, fatigue, polydipsia and polyuria. Pertinent negatives for diabetes include no chest pain and no polyphagia. (She is seriously noncompliant. Her diabetes is complicated by neuropathy, PAD/diabetes foot ulcer s/p partial amputation of the right foot.) Symptoms are worsening. Diabetic complications include peripheral neuropathy, PVD and retinopathy. (She is seriously noncompliant. 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 Current diabetic treatments: Supposed to be on basal/bolus insulin , metformin ,and Invokana however she is noncompliant she ran out of her medications more than 4 weeks ago and did not call for refills. She is compliant with treatment none of the time. She is following a generally unhealthy diet. When asked about meal planning, she reported none. Prior visit with dietitian: She missed her appointment with the dietitian. She never participates in exercise. Home blood sugar record trend: Her A1c increased to 12% (She did not bring any meter nor log to review.) An ACE inhibitor/angiotensin II receptor blocker is being taken. Eye exam is current.  Hyperlipidemia This is a chronic problem. The current episode started more than 1 year ago. The problem is uncontrolled (Her triglycerides are up to 892.). Pertinent negatives include no chest pain, myalgias or shortness of breath. Current antihyperlipidemic treatment includes statins. Compliance problems include adherence to diet and adherence to exercise ( she has a serious problem with compliance for follow-up C medications.).  Risk factors for coronary artery disease include family history, dyslipidemia, diabetes mellitus, hypertension and a sedentary lifestyle.  Hypertension This is a chronic problem.  The current episode started more than 1 year ago. The problem is uncontrolled. Associated symptoms include blurred vision. Pertinent negatives include no chest pain, headaches, palpitations or shortness of breath. Risk factors for coronary artery disease include family history, dyslipidemia, diabetes mellitus, obesity, sedentary lifestyle and smoking/tobacco exposure. Past treatments include ACE inhibitors. Hypertensive end-organ damage includes PVD and retinopathy.     Review of Systems  Constitutional: Positive for fatigue. Negative for unexpected weight change.  HENT: Negative for trouble swallowing and voice change.   Eyes: Positive for blurred vision. Negative for visual disturbance.  Respiratory: Negative for cough, shortness of breath and wheezing.   Cardiovascular: Negative for chest pain, palpitations and leg swelling.  Gastrointestinal: Negative for nausea, vomiting and diarrhea.  Endocrine: Positive for polydipsia and polyuria. Negative for cold intolerance, heat intolerance and polyphagia.  Musculoskeletal: Negative for myalgias and arthralgias.  Skin: Negative for color change, pallor, rash and wound.  Neurological: Negative for seizures and headaches.  Psychiatric/Behavioral: Negative for suicidal ideas and confusion.    Objective:    BP 160/84 mmHg  Pulse 81  Ht 5' 7.5" (1.715 m)  Wt 209 lb (94.802 kg)  BMI 32.23 kg/m2  SpO2 97%  Wt Readings from Last 3 Encounters:  10/28/15 209  lb (94.802 kg)  07/25/15 218 lb (98.884 kg)  10/23/14 215 lb (97.523 kg)    Physical Exam  Constitutional: She is oriented to person, place, and time. She appears well-developed.  HENT:  Head: Normocephalic and atraumatic.  Eyes: EOM are normal.  Neck: Normal range of motion. Neck supple. No tracheal deviation present. No thyromegaly present.  Cardiovascular: Normal rate and regular rhythm.   Pulmonary/Chest: Effort normal and breath sounds normal.  Abdominal: Soft. Bowel sounds are  normal. There is no tenderness. There is no guarding.  Musculoskeletal: Normal range of motion. She exhibits no edema.  Partial amputation of the right foot.  Neurological: She is alert and oriented to person, place, and time. She has normal reflexes. No cranial nerve deficit. Coordination normal.  Skin: Skin is warm and dry. No rash noted. No erythema. No pallor.  Psychiatric:  Reluctant affect, unconcerned ,noncompliant attitude.    Results for orders placed or performed in visit on 10/28/15  Hemoglobin A1c  Result Value Ref Range   Hgb A1c MFr Bld 12.0 (A) 4.0 - 6.0 %   Complete Blood Count (Most recent): Lab Results  Component Value Date   WBC 9.6 02/20/2014   HGB 13.2 02/20/2014   HCT 38.8 02/20/2014   MCV 88.2 02/20/2014   PLT 196 02/20/2014   Chemistry (most recent): Lab Results  Component Value Date   NA 139 02/20/2014   K 3.9 02/20/2014   CL 100 02/20/2014   CO2 26 02/20/2014   BUN 9 02/20/2014   CREATININE 0.75 02/20/2014   Diabetic Labs (most recent): Lab Results  Component Value Date   HGBA1C 12.0* 08/03/2015   HGBA1C 9.3* 08/19/2012   HGBA1C 8.8* 06/03/2012   On 08/03/2015 her labs showed total cholesterol 264, HDL 25, triglycerides 892   Assessment & Plan:   1. Type 2 diabetes mellitus with vascular disease (HCC) Severe peripheral arterial disease status post partial amputation of the right foot.  - Her diabetes is  complicated by severe peripheral arterial disease, noncompliance, and patient remains at a high risk for more acute and chronic complications of diabetes which include CAD, CVA, CKD, retinopathy, and neuropathy. These are all discussed in detail with the patient.  Patient came with out any meter nor blood glucose profile to review, and  recent A1c increased to 12 %.    Recent labs reviewed.   - I have re-counseled the patient on diet management and weight loss  by adopting a carbohydrate restricted / protein rich  Diet.  - Suggestion  is made for patient to avoid simple carbohydrates   from their diet including Cakes , Desserts, Ice Cream,  Soda (  diet and regular) , Sweet Tea , Candies,  Chips, Cookies, Artificial Sweeteners,   and "Sugar-free" Products .  This will help patient to have stable blood glucose profile and potentially avoid unintended  Weight gain.  - Patient is advised to stick to a routine mealtimes to eat 3 meals  a day and avoid unnecessary snacks ( to snack only to correct hypoglycemia).  - The patient  has been  scheduled with Jearld Fenton, RDN, CDE for individualized DM education. She missed several appointments with a dietitian, I urged her to resume follow-up.  - I have approached patient with the following individualized plan to manage diabetes and patient agrees.  - reStart Lantus  at 40  units qhs, and prandial insulin at 10 units [facility] for pre-meal BG readings of 90-150mg /dl, plus patient specific correction dose  of rapid acting insulin for unexpected hyperglycemia above 150mg /dl, associated with strict monitoring of BG AC and HS.  -She will likely need higher dose of insulin, however I will wait to adjust until she commits properly for monitoring blood glucose regularly. -Adjustment parameters for hypo and hyperglycemia were given in a written document to patient. -Patient is encouraged to call clinic for blood glucose levels less than 70 or above 300 mg /dl. -I will continue metformin 500mg  po BID, continue Invokana 100mg  po qam.  - Patient specific target  for A1c; LDL, HDL, Triglycerides, and  Waist Circumference were discussed in detail.  2) BP/HTN:  Uncontrolled. I refilled her medications including ACEI/ARB. 3) Lipids/HPL:  Uncontrolled, triglycerides 892. I added Lovaza 2 g by mouth twice a day and I advised her to continue statins. 4)  Weight/Diet: CDE consult in progress, exercise, and carbohydrates information provided.  5) Personal history of noncompliance with medical treatment,  presenting hazards to health -Re-counseled, see below  6) Chronic Care/Health Maintenance:  -Patient is on ACEI/ARB and Statin medications and encouraged to continue to follow up with Ophthalmology, Podiatrist at least yearly or according to recommendations, and advised to  stay away from smoking. I have recommended yearly flu vaccine and pneumonia vaccination at least every 5 years; moderate intensity exercise for up to 150 minutes weekly; and  sleep for at least 7 hours a day.  - 25 minutes of time was spent on the care of this patient , 50% of which was applied for counseling on diabetes complications and their preventions. -Given warning that this would be the last time I'll see her without her meter AND logs.  - I advised patient to maintain close follow up with [facility], PA-C for primary care needs.  Patient is asked to bring meter and  blood glucose logs during their next visit.   Follow up plan: -Return in about 2 weeks (around 11/11/2015) for diabetes, high blood pressure, high cholesterol, follow up with pre-visit labs, meter, and logs.  Glade Lloyd, MD Phone: [phone]  Fax: [phone]   10/28/2015, 7:33 PM

## 2015-10-31 ENCOUNTER — Other Ambulatory Visit: Payer: Self-pay | Admitting: "Endocrinology

## 2015-10-31 ENCOUNTER — Telehealth: Payer: Self-pay

## 2015-10-31 MED ORDER — GEMFIBROZIL 600 MG PO TABS: 600.0000 mg | ORAL_TABLET | Freq: Two times a day (BID) | ORAL | Status: DC

## 2015-10-31 NOTE — Telephone Encounter (Signed)
Please notify patient that I sent a prescription for gemfibrozil.

## 2015-10-31 NOTE — Telephone Encounter (Signed)
Left message for pt to call back  °

## 2015-10-31 NOTE — Telephone Encounter (Signed)
Pts insurance will not cover Lovaza. She wants to know what else can be prescribed?

## 2015-11-04 NOTE — Telephone Encounter (Signed)
Left message for pt to call back. Awaiting pts return call.

## 2015-11-05 LAB — BASIC METABOLIC PANEL
BUN: 21 mg/dL (ref 7–25)
CHLORIDE: 103 mmol/L (ref 98–110)
CO2: 22 mmol/L (ref 20–31)
Calcium: 9.3 mg/dL (ref 8.6–10.2)
Creat: 1.01 mg/dL (ref 0.50–1.10)
GLUCOSE: 160 mg/dL — AB (ref 65–99)
POTASSIUM: 4.4 mmol/L (ref 3.5–5.3)
SODIUM: 135 mmol/L (ref 135–146)

## 2015-11-05 LAB — HEMOGLOBIN A1C
Hgb A1c MFr Bld: 11 % — ABNORMAL HIGH (ref <5.7)
Mean Plasma Glucose: 269 mg/dL — ABNORMAL HIGH (ref <117)

## 2015-11-13 ENCOUNTER — Ambulatory Visit (INDEPENDENT_AMBULATORY_CARE_PROVIDER_SITE_OTHER): Payer: Medicaid Other | Admitting: "Endocrinology

## 2015-11-13 ENCOUNTER — Encounter: Payer: Self-pay | Admitting: "Endocrinology

## 2015-11-13 VITALS — BP 147/89 | HR 90 | Ht 67.5 in | Wt 215.0 lb

## 2015-11-13 DIAGNOSIS — E669 Obesity, unspecified: Secondary | ICD-10-CM

## 2015-11-13 DIAGNOSIS — I10 Essential (primary) hypertension: Secondary | ICD-10-CM | POA: Diagnosis not present

## 2015-11-13 DIAGNOSIS — E1159 Type 2 diabetes mellitus with other circulatory complications: Secondary | ICD-10-CM | POA: Diagnosis not present

## 2015-11-13 DIAGNOSIS — E785 Hyperlipidemia, unspecified: Secondary | ICD-10-CM | POA: Diagnosis not present

## 2015-11-13 MED ORDER — CANAGLIFLOZIN 100 MG PO TABS: 100.0000 mg | ORAL_TABLET | Freq: Every day | ORAL | Status: DC

## 2015-11-13 MED ORDER — INSULIN ASPART 100 UNIT/ML FLEXPEN: 12.0000 [IU] | PEN_INJECTOR | Freq: Three times a day (TID) | SUBCUTANEOUS | Status: DC

## 2015-11-13 MED ORDER — INSULIN GLARGINE 100 UNIT/ML SOLOSTAR PEN: 50.0000 [IU] | PEN_INJECTOR | Freq: Every day | SUBCUTANEOUS | Status: DC

## 2015-11-13 NOTE — Patient Instructions (Signed)

## 2015-11-14 ENCOUNTER — Encounter: Payer: Self-pay | Admitting: Internal Medicine

## 2015-11-14 ENCOUNTER — Ambulatory Visit (INDEPENDENT_AMBULATORY_CARE_PROVIDER_SITE_OTHER): Payer: Medicaid Other | Admitting: Internal Medicine

## 2015-11-14 VITALS — BP 124/76 | HR 77 | Ht 70.0 in | Wt 214.0 lb

## 2015-11-14 DIAGNOSIS — R072 Precordial pain: Secondary | ICD-10-CM

## 2015-11-14 NOTE — Progress Notes (Signed)
Cardiology Office Note   Date:  11/14/2015   ID:  TICEY COTTLE, DOB 09-Jan-1981, MRN [id number]  PCP:  Geroge Baseman  Cardiologist:   Dorris Carnes, MD   Pt presents for evaluation of CP    History of Present Illness: Anne [name] is a 34 y.o. female with a history of CP   Pt followed by Dr Sharee Pimple  Hx of DM, HTN   Pt says it is a pressure  Heavy  Happens a couple times per day  Stress can make it wors  Will get N/ cool, clammy  Sometimes gets back pain   Gets winded quickly  Rare PND    Hurt foot recently  Amputation 4 year ago  Now wearing boot   Has had DM since age 45  Moderate control of glucose    Has dizzy spells often  ? medicne related  No Synocpe     Current Outpatient Prescriptions  Medication Sig Dispense Refill  . Blood Glucose Monitoring Suppl (ACCU-CHEK AVIVA) device Use as instructed 1 each 0  . canagliflozin (INVOKANA) 100 MG TABS tablet Take 1 tablet (100 mg total) by mouth daily with breakfast. 30 tablet 3  . cholecalciferol (VITAMIN D) 400 UNITS TABS tablet Take 400 Units by mouth.    Marland Kitchen gemfibrozil (LOPID) 600 MG tablet Take 1 tablet (600 mg total) by mouth 2 (two) times daily before a meal. 60 tablet 3  . insulin aspart (NOVOLOG FLEXPEN) 100 UNIT/ML FlexPen Inject 12-18 Units into the skin 3 (three) times daily with meals. 15 mL 2  . Insulin Glargine (LANTUS SOLOSTAR) 100 UNIT/ML Solostar Pen Inject 50 Units into the skin daily at 10 pm. 5 pen 2  . lisinopril (PRINIVIL,ZESTRIL) 20 MG tablet Take 20 mg by mouth daily.    . metFORMIN (GLUCOPHAGE) 500 MG tablet Take 1 tablet (500 mg total) by mouth 2 (two) times daily with a meal. 60 tablet 0  . nabumetone (RELAFEN) 750 MG tablet Take 750 mg by mouth daily.    Marland Kitchen oxyCODONE-acetaminophen (PERCOCET) 5-325 MG per tablet Take 2 tablets by mouth every 4 (four) hours as needed. 15 tablet 0  . Potassium 99 MG TABS Take 1 tablet by mouth daily.    . pravastatin (PRAVACHOL) 40 MG tablet Take 40 mg by  mouth daily.    . sertraline (ZOLOFT) 25 MG tablet Take 50 mg by mouth daily.     Marland Kitchen sulfamethoxazole-trimethoprim (BACTRIM DS) 800-160 MG per tablet Take 1 tablet by mouth 2 (two) times daily. 20 tablet 0   No current facility-administered medications for this visit.    Allergies:   Review of patient's allergies indicates no known allergies.   Past Medical History  Diagnosis Date  . Diabetes mellitus   . Hypertension   . Acid reflux   . Diabetes mellitus type 1 ([place]) 10/31/2011  . Hypercholesteremia   . Sciatica     Past Surgical History  Procedure Laterality Date  . Appendectomy    . Cesarean section  2004 and 2007    x2  . Tubal ligation    . Amputation  11/03/2011    Procedure: AMPUTATION RAY;  Surgeon: Jamesetta So;  Location: [facility];  Service: General;  Laterality: Right;  Right fourth and fifth metatarsal   . Tubal ligation       Social History:  The patient  reports that she quit smoking about 22 months ago. Her smoking use included Cigarettes. She has a 5 pack-year smoking  history. She has never used smokeless tobacco. She reports that she does not drink alcohol or use illicit drugs.   Family History:  The patient's family history includes Diabetes in her father. There is no history of Anesthesia problems, Hypotension, Malignant hyperthermia, or Pseudochol deficiency.    ROS:  Please see the history of present illness. All other systems are reviewed and  Negative to the above problem except as noted.    PHYSICAL EXAM: VS:  BP 124/76 mmHg  Pulse 77  Ht 5\' 10"  (1.778 m)  Wt 97.07 kg (214 lb)  BMI 30.71 kg/m2  SpO2 99%  LMP 10/30/2015  GEN: OBese 34 yo  in no acute distress HEENT: normal Neck: no JVD, carotid bruits, or masses Cardiac: RRR; no murmurs, rubs, or gallops,no edema  R foot in boot   Respiratory:  clear to auscultation bilaterally, normal work of breathing GI: soft, nontender, nondistended, + BS  No hepatomegaly  MS: no deformity Moving all  extremities   Skin: warm and dry, no rash Neuro:  Decreased sensation in legs to calf bilaterally   Psych: euthymic mood, full affect   EKG:  EKG is ordered today.  SR     Lipid Panel    Component Value Date/Time   LDLDIRECT 98 02/10/2012 1230      Wt Readings from Last 3 Encounters:  11/14/15 97.07 kg (214 lb)  11/13/15 97.523 kg (215 lb)  10/28/15 94.802 kg (209 lb)      ASSESSMENT AND PLAN:   1  CP / SOB  Concerning  Some atypical features  Long standing DM with neuropathy I would reocmm echo as well as lexiscan myoviw   No change in medical regimen  2.  HL  Get lipids from primary MD  F/u tentative in 12 months unless sooner based on testing     Signed, Dorris Carnes, MD  11/14/2015 2:13 PM    [facility] [place], [place], [place]  [address] Phone: [phone]; Fax: [phone]

## 2015-11-14 NOTE — Progress Notes (Signed)
Subjective:    Patient ID: Anne [name], female    DOB: 05-12-1981, PCP CLAGGETT,ELIN, PA-C   Past Medical History  Diagnosis Date  . Diabetes mellitus   . Hypertension   . Acid reflux   . Diabetes mellitus type 1 ([place]) 10/31/2011  . Hypercholesteremia   . Sciatica    Past Surgical History  Procedure Laterality Date  . Appendectomy    . Cesarean section  2004 and 2007    x2  . Tubal ligation    . Amputation  11/03/2011    Procedure: AMPUTATION RAY;  Surgeon: Jamesetta So;  Location: [facility];  Service: General;  Laterality: Right;  Right fourth and fifth metatarsal   . Tubal ligation     Social History   Social History  . Marital Status: Married    Spouse Name: N/A  . Number of Children: N/A  . Years of Education: N/A   Social History Main Topics  . Smoking status: Former Smoker -- 0.25 packs/day for 20 years    Types: Cigarettes    Quit date: 12/29/2013  . Smokeless tobacco: Never Used  . Alcohol Use: No  . Drug Use: No  . Sexual Activity: Yes    Birth Control/ Protection: Surgical   Other Topics Concern  . None   Social History Narrative   Outpatient Encounter Prescriptions as of 11/13/2015  Medication Sig  . Blood Glucose Monitoring Suppl (ACCU-CHEK AVIVA) device Use as instructed  . canagliflozin (INVOKANA) 100 MG TABS tablet Take 1 tablet (100 mg total) by mouth daily with breakfast.  . cholecalciferol (VITAMIN D) 400 UNITS TABS tablet Take 400 Units by mouth.  Marland Kitchen gemfibrozil (LOPID) 600 MG tablet Take 1 tablet (600 mg total) by mouth 2 (two) times daily before a meal.  . insulin aspart (NOVOLOG FLEXPEN) 100 UNIT/ML FlexPen Inject 12-18 Units into the skin 3 (three) times daily with meals.  . Insulin Glargine (LANTUS SOLOSTAR) 100 UNIT/ML Solostar Pen Inject 50 Units into the skin daily at 10 pm.  . lisinopril (PRINIVIL,ZESTRIL) 20 MG tablet Take 20 mg by mouth daily.  . metFORMIN (GLUCOPHAGE) 500 MG tablet Take 1 tablet (500 mg total) by mouth 2  (two) times daily with a meal.  . nabumetone (RELAFEN) 750 MG tablet Take 750 mg by mouth daily.  Marland Kitchen oxyCODONE-acetaminophen (PERCOCET) 5-325 MG per tablet Take 2 tablets by mouth every 4 (four) hours as needed.  . Potassium 99 MG TABS Take 1 tablet by mouth daily.  . pravastatin (PRAVACHOL) 40 MG tablet Take 40 mg by mouth daily.  . sertraline (ZOLOFT) 25 MG tablet Take 50 mg by mouth daily.   Marland Kitchen sulfamethoxazole-trimethoprim (BACTRIM DS) 800-160 MG per tablet Take 1 tablet by mouth 2 (two) times daily.  . [DISCONTINUED] canagliflozin (INVOKANA) 100 MG TABS tablet Take 1 tablet (100 mg total) by mouth daily with breakfast.  . [DISCONTINUED] HYDROcodone-acetaminophen (NORCO/VICODIN) 5-325 MG per tablet Take 2 tablets by mouth every 4 (four) hours as needed.  . [DISCONTINUED] insulin aspart (NOVOLOG FLEXPEN) 100 UNIT/ML FlexPen Inject 10-16 Units into the skin 3 (three) times daily with meals.  . [DISCONTINUED] Insulin Glargine (LANTUS SOLOSTAR) 100 UNIT/ML Solostar Pen Inject 40 Units into the skin daily at 10 pm.  . [DISCONTINUED] naproxen (NAPROSYN) 500 MG tablet Take 1 tablet (500 mg total) by mouth 2 (two) times daily with a meal. (Patient not taking: Reported on 07/25/2015)  . [DISCONTINUED] OVER THE COUNTER MEDICATION Take 1 tablet by mouth daily. MAGNESIUM  PO (Strength is unknown)   No facility-administered encounter medications on file as of 11/13/2015.   ALLERGIES: No Known Allergies VACCINATION STATUS: Immunization History  Administered Date(s) Administered  . Influenza Split 08/20/2012  . Pneumococcal Polysaccharide-23 08/20/2012  . Tdap 04/27/2012    Diabetes She presents for her follow-up diabetic visit. She has type 2 diabetes mellitus. Onset time: She was diagnosed at approximate age of 50 years. Her disease course has been improving. There are no hypoglycemic associated symptoms. Pertinent negatives for hypoglycemia include no confusion, headaches, pallor or seizures.  Associated symptoms include blurred vision, fatigue, polydipsia and polyuria. Pertinent negatives for diabetes include no chest pain and no polyphagia. (She is seriously noncompliant. 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 Diabetic complications include peripheral neuropathy, PVD and retinopathy. (She is seriously noncompliant. 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 Current diabetic treatments: Supposed to be on basal/bolus insulin , metformin ,and Invokana however she is noncompliant she ran out of her medications more than 4 weeks ago and did not call for refills. She is compliant with treatment none of the time. She is following a generally unhealthy diet. When asked about meal planning, she reported none. Prior visit with dietitian: She missed her appointment with the dietitian. She never participates in exercise. Home blood sugar record trend: Her  recent A1c increased to 12% Her breakfast blood glucose range is generally 180-200 mg/dl. Her lunch blood glucose range is generally 180-200 mg/dl. Her dinner blood glucose range is generally 180-200 mg/dl. Her overall blood glucose range is 180-200 mg/dl. An ACE inhibitor/angiotensin II receptor blocker is being taken. Eye exam is current.  Hyperlipidemia This is a chronic problem. The current episode started more than 1 year ago. The problem is uncontrolled (Her triglycerides are up to 892.). Pertinent negatives include no chest pain, myalgias or shortness of breath. Current antihyperlipidemic treatment includes statins. Compliance problems include adherence to diet and adherence to exercise ( she has a serious problem with compliance for follow-up  C medications.).  Risk factors for coronary artery disease include family history, dyslipidemia, diabetes mellitus, hypertension and a sedentary lifestyle.  Hypertension This is a chronic problem. The current episode started more than 1 year ago. The problem is uncontrolled. Associated symptoms include blurred vision. Pertinent negatives include no chest pain, headaches, palpitations or shortness of breath. Risk factors for coronary artery disease include family history, dyslipidemia, diabetes mellitus, obesity, sedentary lifestyle and smoking/tobacco exposure. Past treatments include ACE inhibitors. Hypertensive end-organ damage includes PVD and retinopathy.     Review of Systems  Constitutional: Positive for fatigue. Negative for unexpected weight change.  HENT: Negative for trouble swallowing and voice change.   Eyes: Positive for blurred vision. Negative for visual disturbance.  Respiratory: Negative for cough, shortness of breath and wheezing.   Cardiovascular: Negative for chest pain, palpitations and leg swelling.  Gastrointestinal: Negative for nausea, vomiting and diarrhea.  Endocrine: Positive for polydipsia and polyuria. Negative for cold intolerance, heat intolerance and polyphagia.  Musculoskeletal: Negative for myalgias and arthralgias.  Skin: Negative for color change, pallor, rash and wound.  Neurological: Negative for seizures and headaches.  Psychiatric/Behavioral: Negative for suicidal ideas and confusion.    Objective:    BP 147/89 mmHg  Pulse 90  Ht 5' 7.5" (1.715 m)  Wt 215 lb (97.523 kg)  BMI 33.16 kg/m2  SpO2 98%  Wt Readings from Last 3 Encounters:  11/14/15 214 lb (97.07 kg)  11/13/15 215 lb (97.523 kg)  10/28/15 209 lb (94.802 kg)    Physical Exam  Constitutional: She is oriented to person, place, and time. She appears well-developed.  HENT:  Head: Normocephalic and atraumatic.  Eyes: EOM are normal.  Neck: Normal range of motion. Neck supple. No  tracheal deviation present. No thyromegaly present.  Cardiovascular: Normal rate and regular rhythm.   Pulmonary/Chest: Effort normal and breath sounds normal.  Abdominal: Soft. Bowel sounds are normal. There is no tenderness. There is no guarding.  Musculoskeletal: Normal range of motion. She exhibits no edema.  Partial amputation of the right foot.  Neurological: She is alert and oriented to person, place, and time. She has normal reflexes. No cranial nerve deficit. Coordination normal.  Skin: Skin is warm and dry. No rash noted. No erythema. No pallor.  Psychiatric:  Reluctant affect, unconcerned ,noncompliant attitude.    Results for orders placed or performed in visit on 10/28/15  Hemoglobin A1c  Result Value Ref Range   Hgb A1c MFr Bld 12.0 (A) 4.0 - 6.0 %  Basic metabolic panel  Result Value Ref Range   Sodium 135 135 - 146 mmol/L   Potassium 4.4 3.5 - 5.3 mmol/L   Chloride 103 98 - 110 mmol/L   CO2 22 20 - 31 mmol/L   Glucose, Bld 160 (H) 65 - 99 mg/dL   BUN 21 7 - 25 mg/dL   Creat 1.01 0.50 - 1.10 mg/dL   Calcium 9.3 8.6 - 10.2 mg/dL  Hemoglobin A1c  Result Value Ref Range   Hgb A1c MFr Bld 11.0 (H) <5.7 %   Mean Plasma Glucose 269 (H) <117 mg/dL    Lab Results  Component Value Date   WBC 9.6 02/20/2014   HGB 13.2 02/20/2014   HCT 38.8 02/20/2014   MCV 88.2 02/20/2014   PLT 196 02/20/2014    Lab Results  Component Value Date   HGBA1C 11.0* 11/04/2015   HGBA1C 12.0* 08/03/2015   HGBA1C 9.3* 08/19/2012   On 08/03/2015 her labs showed total cholesterol 264, HDL 25, triglycerides 892   Assessment & Plan:   1. Type 2 diabetes mellitus with vascular disease (HCC) Severe peripheral arterial disease status post partial amputation of the right foot.  - Her diabetes is  complicated by severe peripheral arterial disease, noncompliance, and patient remains at a high risk for more acute and chronic complications of diabetes which include CAD, CVA, CKD, retinopathy,  and neuropathy. These are all discussed in detail with the patient.  Patient came with out any meter nor blood glucose profile to review, and  recent A1c increased to 12 %.    Recent labs reviewed.   - I have re-counseled the patient on diet management and weight loss  by adopting a carbohydrate restricted / protein rich  Diet.  - Suggestion is made for patient to avoid simple carbohydrates   from their diet including Cakes , Desserts, Ice Cream,  Soda (  diet and regular) , Sweet Tea , Candies,  Chips, Cookies, Artificial Sweeteners,   and "Sugar-free" Products .  This will help patient to have stable blood glucose profile and potentially avoid unintended  Weight gain.  - Patient is advised to stick to a routine mealtimes to eat 3 meals  a day and avoid unnecessary snacks ( to snack only to correct hypoglycemia).  -  The patient  has been  scheduled with Jearld Fenton, RDN, CDE for individualized DM education. She missed several appointments with a dietitian, I urged her to resume follow-up.  - I have approached patient with the following individualized plan to manage diabetes and patient agrees.  - Increase  Lantus  to 50  units qhs, and prandial insulin at 12 units TIDAC for pre-meal BG readings of 90-150mg /dl, plus patient specific correction dose of rapid acting insulin for unexpected hyperglycemia above 150mg /dl, associated with strict monitoring of BG AC and HS.  -She will likely need higher dose of insulin, however I will wait to adjust until she commits properly for monitoring blood glucose regularly. -Adjustment parameters for hypo and hyperglycemia were given in a written document to patient. -Patient is encouraged to call clinic for blood glucose levels less than 70 or above 300 mg /dl. -I will continue metformin 500mg  po BID, continue Invokana 100mg  po qam.  - Patient specific target  for A1c; LDL, HDL, Triglycerides, and  Waist Circumference were discussed in detail.  2) BP/HTN:   Uncontrolled. I refilled her medications including ACEI/ARB. 3) Lipids/HPL:  Uncontrolled, triglycerides 892. I added Lovaza 2 g by mouth twice a day and I advised her to continue statins. 4)  Weight/Diet: CDE consult in progress, exercise, and carbohydrates information provided.  5) Personal history of noncompliance with medical treatment, presenting hazards to health -Re-counseled, see below  6) Chronic Care/Health Maintenance:  -Patient is on ACEI/ARB and Statin medications and encouraged to continue to follow up with Ophthalmology, Podiatrist at least yearly or according to recommendations, and advised to  stay away from smoking. I have recommended yearly flu vaccine and pneumonia vaccination at least every 5 years; moderate intensity exercise for up to 150 minutes weekly; and  sleep for at least 7 hours a day.  - 25 minutes of time was spent on the care of this patient , 50% of which was applied for counseling on diabetes complications and their preventions. -Given warning that this would be the last time I'll see her without her meter AND logs.  - I advised patient to maintain close follow up with [facility], PA-C for primary care needs.  Patient is asked to bring meter and  blood glucose logs during their next visit.   Follow up plan: -Return in about 3 months (around 02/11/2016) for diabetes, high blood pressure, high cholesterol, follow up with pre-visit labs, meter, and logs.  Glade Lloyd, MD Phone: [phone]  Fax: [phone]   11/14/2015, 7:32 PM

## 2015-11-14 NOTE — Patient Instructions (Addendum)
Your physician recommends that you schedule a follow-up appointment in: to be determined after tests    Your physician recommends that you continue on your current medications as directed. Please refer to the Current Medication list given to you today.      Your physician has requested that you have an echocardiogram. Echocardiography is a painless test that uses sound waves to create images of your heart. It provides your doctor with information about the size and shape of your heart and how well your heart's chambers and valves are working. This procedure takes approximately one hour. There are no restrictions for this procedure.    Your physician has requested that you have a lexiscan myoview. For further information please visit [email or web address]. Please follow instruction sheet, as given.  HOLD ALL DIABETIC MEDICATIONS THE NIGHT BEFORE         Thank you for choosing [facility] !

## 2015-11-28 ENCOUNTER — Encounter (HOSPITAL_COMMUNITY)
Admission: RE | Admit: 2015-11-28 | Discharge: 2015-11-28 | Disposition: A | Discharge status: Home / Self Care | Payer: Medicaid Other | Source: Ambulatory Visit | Attending: Internal Medicine | Admitting: Internal Medicine

## 2015-11-28 ENCOUNTER — Encounter (HOSPITAL_COMMUNITY): Payer: Self-pay

## 2015-11-28 ENCOUNTER — Inpatient Hospital Stay (HOSPITAL_COMMUNITY): Admission: RE | Admit: 2015-11-28 | Payer: Medicaid Other | Source: Ambulatory Visit

## 2015-11-28 ENCOUNTER — Ambulatory Visit (HOSPITAL_COMMUNITY)
Admission: RE | Admit: 2015-11-28 | Discharge: 2015-11-28 | Disposition: A | Discharge status: Home / Self Care | Payer: Medicaid Other | Source: Ambulatory Visit | Attending: Internal Medicine | Admitting: Internal Medicine

## 2015-11-28 ENCOUNTER — Other Ambulatory Visit: Payer: Self-pay | Admitting: "Endocrinology

## 2015-11-28 DIAGNOSIS — R072 Precordial pain: Secondary | ICD-10-CM | POA: Insufficient documentation

## 2015-11-28 LAB — NM MYOCAR MULTI W/SPECT W/WALL MOTION / EF
CHL CUP MPHR: 185 {beats}/min
CHL CUP NUCLEAR SRS: 2
CHL CUP RESTING HR STRESS: 71 {beats}/min
CHL RATE OF PERCEIVED EXERTION: 0
CSEPEDS: 0 s
CSEPEW: 1 METS
CSEPPHR: 117 {beats}/min
Exercise duration (min): 0 min
LVDIAVOL: 92 mL
LVSYSVOL: 40 mL
NUC STRESS TID: 0.94
Percent HR: 63 %
RATE: 0.3
SDS: 1
SSS: 3

## 2015-11-28 MED ORDER — TECHNETIUM TC 99M SESTAMIBI GENERIC - CARDIOLITE
10.0000 | Freq: Once | INTRAVENOUS | Status: AC | PRN
  Administered 2015-11-28: 10 via INTRAVENOUS

## 2015-11-28 MED ORDER — TECHNETIUM TC 99M SESTAMIBI - CARDIOLITE
30.0000 | Freq: Once | INTRAVENOUS | Status: AC | PRN
  Administered 2015-11-28: 10:00:00 32 via INTRAVENOUS

## 2015-11-28 MED ORDER — SODIUM CHLORIDE 0.9 % IJ SOLN
INTRAMUSCULAR | Status: AC
  Administered 2015-11-28: 10 mL via INTRAVENOUS
  Filled 2015-11-28: qty 3

## 2015-11-28 MED ORDER — REGADENOSON 0.4 MG/5ML IV SOLN
INTRAVENOUS | Status: AC
  Administered 2015-11-28: 0.4 mg via INTRAVENOUS
  Filled 2015-11-28: qty 5

## 2015-12-04 ENCOUNTER — Telehealth: Payer: Self-pay | Admitting: Internal Medicine

## 2015-12-04 NOTE — Telephone Encounter (Signed)
New message ° ° ° ° ° °Returning a call to the nurse to get test results °

## 2015-12-04 NOTE — Telephone Encounter (Signed)
Called patient, Advised of normal stress test.  Patient expressed understanding.

## 2016-01-09 ENCOUNTER — Telehealth: Payer: Self-pay | Admitting: *Deleted

## 2016-01-09 MED ORDER — OXYCODONE-ACETAMINOPHEN 5-325 MG PO TABS: 1.0000 | ORAL_TABLET | ORAL | Status: DC | PRN

## 2016-01-09 MED ORDER — NABUMETONE 750 MG PO TABS: 750.0000 mg | ORAL_TABLET | Freq: Two times a day (BID) | ORAL | Status: DC

## 2016-01-09 NOTE — Telephone Encounter (Signed)
Rx printed

## 2016-01-09 NOTE — Telephone Encounter (Signed)
Patient contact number is [phone]

## 2016-01-09 NOTE — Telephone Encounter (Signed)
Pt collected rx.

## 2016-01-09 NOTE — Telephone Encounter (Signed)
Patient called requesting Percet 5-325 60 2 week supply, and the navomentone to be refilled. Please advise

## 2016-01-09 NOTE — Telephone Encounter (Signed)
Prescription available, called patient, no answer 

## 2016-01-21 ENCOUNTER — Ambulatory Visit (INDEPENDENT_AMBULATORY_CARE_PROVIDER_SITE_OTHER): Payer: Medicaid Other | Admitting: Orthopaedic Surgery

## 2016-01-21 VITALS — BP 138/76 | HR 89 | Temp 97.9°F | Ht 70.0 in | Wt 216.2 lb

## 2016-01-21 DIAGNOSIS — M25511 Pain in right shoulder: Secondary | ICD-10-CM | POA: Diagnosis not present

## 2016-01-21 NOTE — Progress Notes (Signed)
Patient Anne [name], female DOB:12/05/1980, 35 y.o. [id number]  Chief Complaint  Patient presents with  . Follow-up    neck and right shoulder pain    HPI  Anne [name] is a 35 y.o. female who has right shoulder pain right neck pain.  She has no paresthesias.  She has no new trauma.  She has been doing exercises for the shoulder.  Shoulder Pain  The pain is present in the right shoulder. This is a chronic problem. The current episode started more than 1 year ago. The problem occurs daily. The problem has been waxing and waning. The quality of the pain is described as aching and dull. The pain is at a severity of 4/10. The pain is mild. She has tried NSAIDS, oral narcotics, rest, cold and chondroitin for the symptoms. The treatment provided moderate relief.    Body mass index is 31.02 kg/(m^2).   Review of Systems  Constitutional:       Patient has Diabetes Mellitus. Patient has hypertension. Patient does not have COPD or shortness of breath. Patient does not have BMI > 35. Patient does not have current smoking history.  Gastrointestinal:       GERD  Musculoskeletal: Positive for myalgias, arthralgias and neck pain.    Past Medical History  Diagnosis Date  . Diabetes mellitus   . Hypertension   . Acid reflux   . Diabetes mellitus type 1 ([place]) 10/31/2011  . Hypercholesteremia   . Sciatica     Past Surgical History  Procedure Laterality Date  . Appendectomy    . Cesarean section  2004 and 2007    x2  . Tubal ligation    . Amputation  11/03/2011    Procedure: AMPUTATION RAY;  Surgeon: Jamesetta So;  Location: [facility];  Service: General;  Laterality: Right;  Right fourth and fifth metatarsal   . Tubal ligation      Family History  Problem Relation Age of Onset  . Anesthesia problems Neg Hx   . Hypotension Neg Hx   . Malignant hyperthermia Neg Hx   . Pseudochol deficiency Neg Hx   . Diabetes Father     Social History Social History  Substance Use  Topics  . Smoking status: Former Smoker -- 0.25 packs/day for 20 years    Types: Cigarettes    Quit date: 12/29/2013  . Smokeless tobacco: Never Used  . Alcohol Use: No    No Known Allergies  Current Outpatient Prescriptions  Medication Sig Dispense Refill  . Blood Glucose Monitoring Suppl (ACCU-CHEK AVIVA) device Use as instructed 1 each 0  . canagliflozin (INVOKANA) 100 MG TABS tablet Take 1 tablet (100 mg total) by mouth daily with breakfast. 30 tablet 3  . cholecalciferol (VITAMIN D) 400 UNITS TABS tablet Take 400 Units by mouth.    Marland Kitchen gemfibrozil (LOPID) 600 MG tablet Take 1 tablet (600 mg total) by mouth 2 (two) times daily before a meal. 60 tablet 3  . insulin aspart (NOVOLOG FLEXPEN) 100 UNIT/ML FlexPen Inject 12-18 Units into the skin 3 (three) times daily with meals. 15 mL 2  . Insulin Glargine (LANTUS SOLOSTAR) 100 UNIT/ML Solostar Pen Inject 50 Units into the skin daily at 10 pm. 5 pen 2  . lisinopril (PRINIVIL,ZESTRIL) 20 MG tablet Take 20 mg by mouth daily.    . metFORMIN (GLUCOPHAGE) 500 MG tablet TAKE 1 TABLET BY MOUTH TWICE DAILY WITH MEALS. 60 tablet 2  . nabumetone (RELAFEN) 750 MG tablet Take 1 tablet (  750 mg total) by mouth 2 (two) times daily. 60 tablet 5  . Omega-3 Fatty Acids (FISH OIL) 1000 MG CAPS Take by mouth.    Marland Kitchen omeprazole (PRILOSEC) 20 MG capsule Take 20 mg by mouth daily.    Marland Kitchen oxyCODONE-acetaminophen (PERCOCET/ROXICET) 5-325 MG tablet Take 1 tablet by mouth every 4 (four) hours as needed for moderate pain or severe pain (Must last 30 days.  Do not drive or operate machinery while taking this medicine). 120 tablet 0  . Potassium 99 MG TABS Take 1 tablet by mouth daily.    . pravastatin (PRAVACHOL) 40 MG tablet Take 40 mg by mouth daily.    . sertraline (ZOLOFT) 25 MG tablet Take 50 mg by mouth daily.     Marland Kitchen sulfamethoxazole-trimethoprim (BACTRIM DS) 800-160 MG per tablet Take 1 tablet by mouth 2 (two) times daily. 20 tablet 0   No current  facility-administered medications for this visit.     Physical Exam  Blood pressure 138/76, pulse 89, temperature 97.9 F (36.6 C), height 5\' 10"  (1.778 m), weight 216 lb 3.2 oz (98.068 kg).  Constitutional: overall normal hygiene, normal nutrition, well developed, normal grooming, normal body habitus. Assistive device:none  Musculoskeletal: gait and station Limp none, muscle tone and strength are normal, no tremors or atrophy is present.  .  Neurological: coordination overall normal.  Deep tendon reflex/nerve stretch intact.  Sensation normal.  Cranial nerves II-XII intact.   Skin:   normal overall no scars, lesions, ulcers or rashes. No psoriasis.  Psychiatric: Alert and oriented x 3.  Recent memory intact, remote memory unclear.  Normal mood and affect. Well groomed.  Good eye contact.  Cardiovascular: overall no swelling, no varicosities, no edema bilaterally, normal temperatures of the legs and arms, no clubbing, cyanosis and good capillary refill.  Lymphatic: palpation is normal.  Examination of right Upper Extremity is done.  Inspection:   Overall:  Elbow non-tender without crepitus or defects, forearm non-tender without crepitus or defects, wrist non-tender without crepitus or defects, hand non-tender.    Shoulder: with glenohumeral joint tenderness, without effusion.   Upper arm: without swelling and tenderness   Range of motion:   Overall:  Full range of motion of the elbow, full range of motion of wrist and full range of motion in fingers.   Shoulder:  right  160 degrees forward flexion; 145 degrees abduction; 35 degrees internal rotation, 35 degrees external rotation, 20 degrees extension, 40 degrees adduction.   Stability:   Overall:  Shoulder, elbow and wrist stable   Strength and Tone:   Overall full shoulder muscles strength, full upper arm strength and normal upper arm bulk and tone.  The patient has been educated about the nature of the problem(s) and  counseled on treatment options.  The patient appeared to understand what I have discussed and is in agreement with it.  PLAN Call if any problems.  Precautions discussed.  Continue current medications.   Return to clinic 3 months

## 2016-02-03 ENCOUNTER — Other Ambulatory Visit: Payer: Self-pay | Admitting: "Endocrinology

## 2016-02-12 ENCOUNTER — Telehealth: Payer: Self-pay | Admitting: Orthopaedic Surgery

## 2016-02-12 MED ORDER — HYDROCODONE-ACETAMINOPHEN 7.5-325 MG PO TABS: 1.0000 | ORAL_TABLET | ORAL | Status: DC | PRN

## 2016-02-12 NOTE — Telephone Encounter (Signed)
Rx done. 

## 2016-02-12 NOTE — Telephone Encounter (Signed)
Patient requesting refill of Oxycodone 5/325mg  Qty 120 Tablets

## 2016-02-17 ENCOUNTER — Ambulatory Visit: Payer: Medicaid Other | Admitting: "Endocrinology

## 2016-03-13 ENCOUNTER — Telehealth: Payer: Self-pay | Admitting: Orthopaedic Surgery

## 2016-03-13 NOTE — Telephone Encounter (Signed)
Hydrocodone-Acetaminophen 7.5/325mg Qty 120 Tablets °

## 2016-03-16 MED ORDER — HYDROCODONE-ACETAMINOPHEN 7.5-325 MG PO TABS: 1.0000 | ORAL_TABLET | ORAL | Status: DC | PRN

## 2016-03-16 NOTE — Telephone Encounter (Signed)
Rx Done . 

## 2016-03-18 ENCOUNTER — Other Ambulatory Visit: Payer: Self-pay

## 2016-03-18 MED ORDER — CANAGLIFLOZIN 100 MG PO TABS: 100.0000 mg | ORAL_TABLET | Freq: Every day | ORAL | Status: DC

## 2016-04-10 ENCOUNTER — Other Ambulatory Visit: Payer: Self-pay | Admitting: Neurosurgery

## 2016-04-10 DIAGNOSIS — M5022 Other cervical disc displacement, mid-cervical region, unspecified level: Secondary | ICD-10-CM

## 2016-04-15 ENCOUNTER — Telehealth: Payer: Self-pay | Admitting: Orthopaedic Surgery

## 2016-04-15 MED ORDER — HYDROCODONE-ACETAMINOPHEN 7.5-325 MG PO TABS: 1.0000 | ORAL_TABLET | ORAL | Status: DC | PRN

## 2016-04-15 NOTE — Telephone Encounter (Signed)
Rx Done . 

## 2016-04-15 NOTE — Telephone Encounter (Signed)
Hydrocodone-Acetaminophen 7.5/325mg Qty 120 Tablets °

## 2016-04-21 ENCOUNTER — Ambulatory Visit (INDEPENDENT_AMBULATORY_CARE_PROVIDER_SITE_OTHER): Payer: Medicaid Other | Admitting: Orthopaedic Surgery

## 2016-04-21 ENCOUNTER — Encounter: Payer: Self-pay | Admitting: Orthopaedic Surgery

## 2016-04-21 VITALS — BP 129/79 | HR 104 | Temp 97.7°F | Ht 70.0 in | Wt 216.0 lb

## 2016-04-21 DIAGNOSIS — M25511 Pain in right shoulder: Secondary | ICD-10-CM

## 2016-04-21 NOTE — Progress Notes (Signed)
Patient Anne [name], female DOB:[birth year], 35 y.o. [id number]  Chief Complaint  Patient presents with  . Follow-up    Shoulder and neck pain    HPI  Anne [name] is a 35 y.o. female who is seen for chronic right shoulder pain and neck pain.  Dr. Lorna Dibble, her neurosurgeon, is retiring and will not be doing surgery on her neck as he had originally scheduled.  She is waiting to get the name of the new surgeon from his group.  Much of her neck pain radiates from the shoulder and I would like to see how she does from the planned surgery.  She understands and agrees.  She has pain most days but is stable.  HPI  Body mass index is 30.99 kg/(m^2).  ROS  Review of Systems  Constitutional:       Patient has Diabetes Mellitus. Patient has hypertension. Patient does not have COPD or shortness of breath. Patient does not have BMI > 35. Patient does not have current smoking history.  HENT: Negative for congestion.   Respiratory: Negative for cough and shortness of breath.   Cardiovascular: Negative for chest pain and leg swelling.  Gastrointestinal:       GERD  Endocrine: Positive for cold intolerance.  Musculoskeletal: Positive for myalgias, arthralgias and neck pain.  Allergic/Immunologic: Positive for environmental allergies.    Past Medical History  Diagnosis Date  . Diabetes mellitus   . Hypertension   . Acid reflux   . Diabetes mellitus type 1 ([place]) 10/31/2011  . Hypercholesteremia   . Sciatica     Past Surgical History  Procedure Laterality Date  . Appendectomy    . Cesarean section  2004 and 2007    x2  . Tubal ligation    . Amputation  11/03/2011    Procedure: AMPUTATION RAY;  Surgeon: Jamesetta So;  Location: [facility];  Service: General;  Laterality: Right;  Right fourth and fifth metatarsal   . Tubal ligation      Family History  Problem Relation Age of Onset  . Anesthesia problems Neg Hx   . Hypotension Neg Hx   . Malignant hyperthermia Neg Hx    . Pseudochol deficiency Neg Hx   . Diabetes Father     Social History Social History  Substance Use Topics  . Smoking status: Former Smoker -- 0.25 packs/day for 20 years    Types: Cigarettes    Quit date: 12/29/2013  . Smokeless tobacco: Never Used  . Alcohol Use: No    No Known Allergies  Current Outpatient Prescriptions  Medication Sig Dispense Refill  . Blood Glucose Monitoring Suppl (ACCU-CHEK AVIVA) device Use as instructed 1 each 0  . canagliflozin (INVOKANA) 100 MG TABS tablet Take 1 tablet (100 mg total) by mouth daily with breakfast. 30 tablet 3  . cholecalciferol (VITAMIN D) 400 UNITS TABS tablet Take 400 Units by mouth.    . doxycycline (VIBRAMYCIN) 100 MG capsule Take 100 mg by mouth 2 (two) times daily.    Marland Kitchen gemfibrozil (LOPID) 600 MG tablet TAKE (1) TABLET BY MOUTH TWICE A DAY BEFORE MEALS. (BREAKFAST AND SUPPER) 60 tablet 2  . HYDROcodone-acetaminophen (NORCO) 7.5-325 MG tablet Take 1 tablet by mouth every 4 (four) hours as needed for moderate pain (Must last 30 days.  Do not drive or operate machinery while taking this medicine.). 120 tablet 0  . LANTUS SOLOSTAR 100 UNIT/ML Solostar Pen INNJECT 50 UNITS S.Q. ONCE DAILY AT 10 P.M. 15 mL 2  .  lisinopril (PRINIVIL,ZESTRIL) 20 MG tablet Take 20 mg by mouth daily.    . metFORMIN (GLUCOPHAGE) 500 MG tablet TAKE 1 TABLET BY MOUTH TWICE DAILY WITH MEALS. 60 tablet 2  . nabumetone (RELAFEN) 750 MG tablet Take 1 tablet (750 mg total) by mouth 2 (two) times daily. 60 tablet 5  . NOVOLOG FLEXPEN 100 UNIT/ML FlexPen INJECT 12-18 UNITS S.Q. THREE TIMES DAILY WITH MEALS. 15 mL 2  . Omega-3 Fatty Acids (FISH OIL) 1000 MG CAPS Take by mouth.    Marland Kitchen omeprazole (PRILOSEC) 20 MG capsule Take 20 mg by mouth daily.    . Potassium 99 MG TABS Take 1 tablet by mouth daily.    . sertraline (ZOLOFT) 25 MG tablet Take 50 mg by mouth daily.     Marland Kitchen sulfamethoxazole-trimethoprim (BACTRIM DS) 800-160 MG per tablet Take 1 tablet by mouth 2 (two)  times daily. 20 tablet 0   No current facility-administered medications for this visit.     Physical Exam  Blood pressure 129/79, pulse 104, temperature 97.7 F (36.5 C), height 5\' 10"  (1.778 m), weight 216 lb (97.977 kg).  Constitutional: overall normal hygiene, normal nutrition, well developed, normal grooming, normal body habitus. Assistive device:none  Musculoskeletal: gait and station Limp none, muscle tone and strength are normal, no tremors or atrophy is present.  .  Neurological: coordination overall normal.  Deep tendon reflex/nerve stretch intact.  Sensation normal.  Cranial nerves II-XII intact.   Skin:   normal overall no scars, lesions, ulcers or rashes. No psoriasis.  Psychiatric: Alert and oriented x 3.  Recent memory intact, remote memory unclear.  Normal mood and affect. Well groomed.  Good eye contact.  Cardiovascular: overall no swelling, no varicosities, no edema bilaterally, normal temperatures of the legs and arms, no clubbing, cyanosis and good capillary refill.  Lymphatic: palpation is normal.  Examination of right Upper Extremity is done.  Inspection:   Overall:  Elbow non-tender without crepitus or defects, forearm non-tender without crepitus or defects, wrist non-tender without crepitus or defects, hand non-tender.    Shoulder: with glenohumeral joint tenderness, without effusion.   Upper arm: without swelling and tenderness   Range of motion:   Overall:  Full range of motion of the elbow, full range of motion of wrist and full range of motion in fingers.   Shoulder:  right  145 degrees forward flexion; 125 degrees abduction; 35 degrees internal rotation, 35 degrees external rotation, 20 degrees extension, 40 degrees adduction.   Stability:   Overall:  Shoulder, elbow and wrist stable   Strength and Tone:   Overall full shoulder muscles strength, full upper arm strength and normal upper arm bulk and tone.   The patient has been educated about the  nature of the problem(s) and counseled on treatment options.  The patient appeared to understand what I have discussed and is in agreement with it.  Encounter Diagnosis  Name Primary?  . Right shoulder pain Yes    PLAN Call if any problems.  Precautions discussed.  Continue current medications.   Return to clinic 3 months

## 2016-04-21 NOTE — Patient Instructions (Signed)
She is seeing Dr. Hal Neer for her neck. He is retiring and she is waiting to be referred to another Dr. In his practice. She was instructed to call the office and explain that she is worse and get worked in soon. Continue medications.

## 2016-04-28 ENCOUNTER — Ambulatory Visit
Admission: RE | Admit: 2016-04-28 | Discharge: 2016-04-28 | Disposition: A | Discharge status: Home / Self Care | Payer: Medicaid Other | Source: Ambulatory Visit | Attending: Neurosurgery | Admitting: Neurosurgery

## 2016-04-28 DIAGNOSIS — M5022 Other cervical disc displacement, mid-cervical region, unspecified level: Secondary | ICD-10-CM

## 2016-04-28 MED ORDER — ONDANSETRON HCL 4 MG/2ML IJ SOLN
4.0000 mg | Freq: Once | INTRAMUSCULAR | Status: AC
  Administered 2016-04-28: 4 mg via INTRAMUSCULAR

## 2016-04-28 MED ORDER — IOPAMIDOL (ISOVUE-M 300) INJECTION 61%
15.0000 mL | Freq: Once | INTRAMUSCULAR | Status: AC | PRN
  Administered 2016-04-28: 15 mL via INTRATHECAL

## 2016-04-28 MED ORDER — DIAZEPAM 5 MG PO TABS
10.0000 mg | ORAL_TABLET | Freq: Once | ORAL | Status: AC
  Administered 2016-04-28: 10 mg via ORAL

## 2016-04-28 MED ORDER — MEPERIDINE HCL 100 MG/ML IJ SOLN
100.0000 mg | Freq: Once | INTRAMUSCULAR | Status: AC
  Administered 2016-04-28: 100 mg via INTRAMUSCULAR

## 2016-04-28 NOTE — Progress Notes (Signed)
Patient states she has been off Zoloft for at least the past two days. 

## 2016-04-28 NOTE — Discharge Instructions (Signed)
Myelogram Discharge Instructions  1. Go home and rest quietly for the next 24 hours.  It is important to lie flat for the next 24 hours.  Get up only to go to the restroom.  You may lie in the bed or on a couch on your back, your stomach, your left side or your right side.  You may have one pillow under your head.  You may have pillows between your knees while you are on your side or under your knees while you are on your back.  2. DO NOT drive today.  Recline the seat as far back as it will go, while still wearing your seat belt, on the way home.  3. You may get up to go to the bathroom as needed.  You may sit up for 10 minutes to eat.  You may resume your normal diet and medications unless otherwise indicated.  Drink lots of extra fluids today and tomorrow.  4. The incidence of headache, nausea, or vomiting is about 5% (one in 20 patients).  If you develop a headache, lie flat and drink plenty of fluids until the headache goes away.  Caffeinated beverages may be helpful.  If you develop severe nausea and vomiting or a headache that does not go away with flat bed rest, call [phone].  5. You may resume normal activities after your 24 hours of bed rest is over; however, do not exert yourself strongly or do any heavy lifting tomorrow. If when you get up you have a headache when standing, go back to bed and force fluids for another 24 hours.  6. Call your physician for a follow-up appointment.  The results of your myelogram will be sent directly to your physician by the following day.  7. If you have any questions or if complications develop after you arrive home, please call [phone].  Discharge instructions have been explained to the patient.  The patient, or the person responsible for the patient, fully understands these instructions.       May resume Zoloft on April 29, 2016, after 11:00 am.

## 2016-04-29 ENCOUNTER — Other Ambulatory Visit: Payer: Self-pay | Admitting: "Endocrinology

## 2016-05-18 ENCOUNTER — Telehealth: Payer: Self-pay | Admitting: Orthopaedic Surgery

## 2016-05-18 MED ORDER — HYDROCODONE-ACETAMINOPHEN 7.5-325 MG PO TABS: 1.0000 | ORAL_TABLET | ORAL | Status: DC | PRN

## 2016-05-18 NOTE — Telephone Encounter (Signed)
Rx done. 

## 2016-05-18 NOTE — Addendum Note (Signed)
Addended by: Willette Pa on: 05/18/2016 02:20 PM   Modules accepted: Orders

## 2016-06-23 ENCOUNTER — Telehealth: Payer: Self-pay | Admitting: Orthopaedic Surgery

## 2016-06-23 MED ORDER — HYDROCODONE-ACETAMINOPHEN 7.5-325 MG PO TABS: 1.0000 | ORAL_TABLET | ORAL | 0 refills | Status: DC | PRN

## 2016-06-23 NOTE — Telephone Encounter (Signed)
Hydrocodone-Acetaminophen 7.5/325mg Qty 120 Tablets °

## 2016-07-22 ENCOUNTER — Ambulatory Visit: Payer: Medicaid Other | Admitting: Orthopaedic Surgery

## 2016-07-28 ENCOUNTER — Ambulatory Visit (INDEPENDENT_AMBULATORY_CARE_PROVIDER_SITE_OTHER): Payer: Medicaid Other | Admitting: Orthopaedic Surgery

## 2016-07-28 ENCOUNTER — Encounter: Payer: Self-pay | Admitting: Orthopaedic Surgery

## 2016-07-28 VITALS — BP 155/94 | HR 90 | Temp 97.2°F | Ht 70.0 in | Wt 222.0 lb

## 2016-07-28 DIAGNOSIS — M25511 Pain in right shoulder: Secondary | ICD-10-CM | POA: Diagnosis not present

## 2016-07-28 MED ORDER — HYDROCODONE-ACETAMINOPHEN 7.5-325 MG PO TABS: ORAL_TABLET | ORAL | 0 refills | Status: DC

## 2016-07-28 NOTE — Progress Notes (Signed)
Patient Anne [name], female DOB:1981/01/13, 35 y.o. [id number]  Chief Complaint  Patient presents with  . Follow-up    RIGHT SHOULDER    HPI  Anne [name] is a 35 y.o. female who has right shoulder pain chronically.  She has no new trauma,no redness,no paresthesias.  She is doing her exercises.  She has other problems with the right foot and lower back treated by other physicians. HPI  Body mass index is 31.85 kg/m.  ROS  Review of Systems  Constitutional:       Patient has Diabetes Mellitus. Patient has hypertension. Patient does not have COPD or shortness of breath. Patient does not have BMI > 35. Patient does not have current smoking history.  HENT: Negative for congestion.   Respiratory: Negative for cough and shortness of breath.   Cardiovascular: Negative for chest pain and leg swelling.  Gastrointestinal:       GERD  Endocrine: Positive for cold intolerance.  Musculoskeletal: Positive for arthralgias, myalgias and neck pain.  Allergic/Immunologic: Positive for environmental allergies.    Past Medical History:  Diagnosis Date  . Acid reflux   . Diabetes mellitus   . Diabetes mellitus type 1 ([place]) 10/31/2011  . Hypercholesteremia   . Hypertension   . Sciatica     Past Surgical History:  Procedure Laterality Date  . AMPUTATION  11/03/2011   Procedure: AMPUTATION RAY;  Surgeon: Jamesetta So;  Location: [facility];  Service: General;  Laterality: Right;  Right fourth and fifth metatarsal   . APPENDECTOMY    . CESAREAN SECTION  2004 and 2007   x2  . TUBAL LIGATION    . TUBAL LIGATION      Family History  Problem Relation Age of Onset  . Anesthesia problems Neg Hx   . Hypotension Neg Hx   . Malignant hyperthermia Neg Hx   . Pseudochol deficiency Neg Hx   . Diabetes Father     Social History Social History  Substance Use Topics  . Smoking status: Former Smoker    Packs/day: 0.25    Years: 20.00    Types: Cigarettes    Quit date:  12/29/2013  . Smokeless tobacco: Never Used  . Alcohol use No    No Known Allergies  Current Outpatient Prescriptions  Medication Sig Dispense Refill  . Blood Glucose Monitoring Suppl (ACCU-CHEK AVIVA) device Use as instructed 1 each 0  . canagliflozin (INVOKANA) 100 MG TABS tablet Take 1 tablet (100 mg total) by mouth daily with breakfast. 30 tablet 3  . cholecalciferol (VITAMIN D) 400 UNITS TABS tablet Take 400 Units by mouth.    . doxycycline (VIBRAMYCIN) 100 MG capsule Take 100 mg by mouth 2 (two) times daily.    Marland Kitchen gemfibrozil (LOPID) 600 MG tablet TAKE (1) TABLET BY MOUTH TWICE A DAY BEFORE MEALS. (BREAKFAST AND SUPPER) 60 tablet 2  . LANTUS SOLOSTAR 100 UNIT/ML Solostar Pen INNJECT 50 UNITS S.Q. ONCE DAILY AT 10 P.M. 15 mL 2  . lisinopril (PRINIVIL,ZESTRIL) 20 MG tablet Take 20 mg by mouth daily.    . metFORMIN (GLUCOPHAGE) 500 MG tablet TAKE 1 TABLET BY MOUTH TWICE DAILY WITH MEALS. 60 tablet 2  . nabumetone (RELAFEN) 750 MG tablet Take 1 tablet (750 mg total) by mouth 2 (two) times daily. 60 tablet 5  . NOVOLOG FLEXPEN 100 UNIT/ML FlexPen INJECT 12-18 UNITS S.Q. THREE TIMES DAILY WITH MEALS. 15 mL 3  . Omega-3 Fatty Acids (FISH OIL) 1000 MG CAPS Take by mouth.    Marland Kitchen  omeprazole (PRILOSEC) 20 MG capsule Take 20 mg by mouth daily.    . Potassium 99 MG TABS Take 1 tablet by mouth daily.    . sertraline (ZOLOFT) 25 MG tablet Take 50 mg by mouth daily.     Marland Kitchen sulfamethoxazole-trimethoprim (BACTRIM DS) 800-160 MG per tablet Take 1 tablet by mouth 2 (two) times daily. 20 tablet 0  . HYDROcodone-acetaminophen (NORCO) 7.5-325 MG tablet One every four hours for pain as needed.  Do not drive car or operate machinery while taking this medicine.  Must last 14 days. 56 tablet 0   No current facility-administered medications for this visit.      Physical Exam  Blood pressure (!) 155/94, pulse 90, temperature 97.2 F (36.2 C), height 5\' 10"  (1.778 m), weight 222 lb (100.7 kg).  Constitutional:  overall normal hygiene, normal nutrition, well developed, normal grooming, normal body habitus. Assistive device:cast boot right  Musculoskeletal: gait and station Limp right, muscle tone and strength are normal, no tremors or atrophy is present.  .  Neurological: coordination overall normal.  Deep tendon reflex/nerve stretch intact.  Sensation normal.  Cranial nerves II-XII intact.   Skin:   normal overall no scars, lesions, ulcers or rashes. No psoriasis.  Psychiatric: Alert and oriented x 3.  Recent memory intact, remote memory unclear.  Normal mood and affect. Well groomed.  Good eye contact.  Cardiovascular: overall no swelling, no varicosities, no edema bilaterally, normal temperatures of the legs and arms, no clubbing, cyanosis and good capillary refill.  Lymphatic: palpation is normal.  Examination of right Upper Extremity is done.  Inspection:   Overall:  Elbow non-tender without crepitus or defects, forearm non-tender without crepitus or defects, wrist non-tender without crepitus or defects, hand non-tender.    Shoulder: with glenohumeral joint tenderness, without effusion.   Upper arm: without swelling and tenderness   Range of motion:   Overall:  Full range of motion of the elbow, full range of motion of wrist and full range of motion in fingers.   Shoulder:  right  165 degrees forward flexion; 160 degrees abduction; 35 degrees internal rotation, 35 degrees external rotation, 20 degrees extension, 40 degrees adduction.   Stability:   Overall:  Shoulder, elbow and wrist stable   Strength and Tone:   Overall full shoulder muscles strength, full upper arm strength and normal upper arm bulk and tone.   The patient has been educated about the nature of the problem(s) and counseled on treatment options.  The patient appeared to understand what I have discussed and is in agreement with it.  Encounter Diagnosis  Name Primary?  . Right shoulder pain Yes    PLAN Call if any  problems.  Precautions discussed.  Continue current medications.   Return to clinic 3 months   Electronically Signed Sanjuana Kava, MD 9/5/201711:22 AM

## 2016-08-06 ENCOUNTER — Other Ambulatory Visit: Payer: Self-pay | Admitting: Orthopaedic Surgery

## 2016-08-11 ENCOUNTER — Telehealth: Payer: Self-pay | Admitting: Orthopaedic Surgery

## 2016-08-11 MED ORDER — HYDROCODONE-ACETAMINOPHEN 7.5-325 MG PO TABS: ORAL_TABLET | ORAL | 0 refills | Status: DC

## 2016-08-11 NOTE — Telephone Encounter (Signed)
Hydrocodone-Acetaminophen 7.5/325mg   Qty 56 Tablets   Patient put in too early on mychart.

## 2016-08-27 ENCOUNTER — Other Ambulatory Visit: Payer: Self-pay | Admitting: *Deleted

## 2016-08-27 ENCOUNTER — Telehealth: Payer: Self-pay | Admitting: Orthopaedic Surgery

## 2016-08-27 MED ORDER — HYDROCODONE-ACETAMINOPHEN 7.5-325 MG PO TABS
ORAL_TABLET | ORAL | 0 refills | Status: DC
Start: 2016-08-27 — End: 2016-08-31

## 2016-08-28 ENCOUNTER — Other Ambulatory Visit: Payer: Self-pay | Admitting: Orthopedic Surgery

## 2016-08-31 ENCOUNTER — Other Ambulatory Visit: Payer: Self-pay | Admitting: *Deleted

## 2016-08-31 MED ORDER — HYDROCODONE-ACETAMINOPHEN 7.5-325 MG PO TABS
ORAL_TABLET | ORAL | 0 refills | Status: DC
Start: 2016-08-31 — End: 2016-09-17

## 2016-09-03 ENCOUNTER — Ambulatory Visit: Payer: Self-pay | Admitting: "Endocrinology

## 2016-09-14 ENCOUNTER — Other Ambulatory Visit: Payer: Self-pay | Admitting: "Endocrinology

## 2016-09-17 ENCOUNTER — Telehealth: Payer: Self-pay | Admitting: Orthopaedic Surgery

## 2016-09-17 MED ORDER — HYDROCODONE-ACETAMINOPHEN 7.5-325 MG PO TABS: ORAL_TABLET | ORAL | 0 refills | Status: DC

## 2016-09-17 NOTE — Telephone Encounter (Signed)
Patient called and stated that their internet was down and therefore called in this time for her refill.  Hydrocodone/Acetaminophen(Norco) 7.5-325  Mgs.   Qty 11  Sig: One every four hours for pain as needed. Do not drive car or operate machinery while taking this medicine. Must last 14 days.

## 2016-10-05 ENCOUNTER — Other Ambulatory Visit: Payer: Self-pay | Admitting: *Deleted

## 2016-10-05 ENCOUNTER — Other Ambulatory Visit: Payer: Self-pay | Admitting: Orthopaedic Surgery

## 2016-10-05 MED ORDER — HYDROCODONE-ACETAMINOPHEN 7.5-325 MG PO TABS
ORAL_TABLET | ORAL | 0 refills | Status: DC
Start: 2016-10-05 — End: 2016-10-20

## 2016-10-20 ENCOUNTER — Telehealth: Payer: Self-pay | Admitting: Orthopaedic Surgery

## 2016-10-20 MED ORDER — HYDROCODONE-ACETAMINOPHEN 7.5-325 MG PO TABS: ORAL_TABLET | ORAL | 0 refills | Status: DC

## 2016-10-20 NOTE — Telephone Encounter (Signed)
Patient cant go through Mychart due to having internet problems.  She requests a refill on Hydrocodone/Acetaminophen (Norco)  7.5-325 mgs.   Qty  50   Sig: One every four hours for pain as needed. Do not drive car or operate machinery while taking this medicine. Must last 14 days.

## 2016-10-27 ENCOUNTER — Ambulatory Visit: Payer: Medicaid Other | Admitting: Orthopaedic Surgery

## 2016-10-29 ENCOUNTER — Ambulatory Visit: Payer: Medicaid Other | Admitting: Orthopaedic Surgery

## 2016-11-03 ENCOUNTER — Encounter: Payer: Self-pay | Admitting: Orthopaedic Surgery

## 2016-11-03 ENCOUNTER — Ambulatory Visit (INDEPENDENT_AMBULATORY_CARE_PROVIDER_SITE_OTHER): Payer: Medicaid Other | Admitting: Orthopaedic Surgery

## 2016-11-03 VITALS — BP 142/88 | HR 95 | Temp 97.3°F | Resp 18 | Ht 70.0 in | Wt 222.0 lb

## 2016-11-03 DIAGNOSIS — M25511 Pain in right shoulder: Secondary | ICD-10-CM

## 2016-11-03 DIAGNOSIS — G8929 Other chronic pain: Secondary | ICD-10-CM

## 2016-11-03 MED ORDER — HYDROCODONE-ACETAMINOPHEN 7.5-325 MG PO TABS: ORAL_TABLET | ORAL | 0 refills | Status: DC

## 2016-11-03 NOTE — Progress Notes (Signed)
Patient [id number] Anne [name], female DOB:[birth year], 35 y.o. [id number]  Chief Complaint  Patient presents with  . Follow-up    Recheck right shoulder.    HPI  Anne [name] is a 35 y.o. female who has chronic right shoulder pain.  She has no new trauma. She is considering surgery options.  She has no paresthesias. She is doing her exercises. HPI  Body mass index is 31.85 kg/m.  ROS  Review of Systems  Constitutional:       Patient has Diabetes Mellitus. Patient has hypertension. Patient does not have COPD or shortness of breath. Patient does not have BMI > 35. Patient does not have current smoking history.  HENT: Negative for congestion.   Respiratory: Negative for cough and shortness of breath.   Cardiovascular: Negative for chest pain and leg swelling.  Gastrointestinal:       GERD  Endocrine: Positive for cold intolerance.  Musculoskeletal: Positive for arthralgias, myalgias and neck pain.  Allergic/Immunologic: Positive for environmental allergies.    Past Medical History:  Diagnosis Date  . Acid reflux   . Diabetes mellitus   . Diabetes mellitus type 1 ([place]) 10/31/2011  . Hypercholesteremia   . Hypertension   . Sciatica     Past Surgical History:  Procedure Laterality Date  . AMPUTATION  11/03/2011   Procedure: AMPUTATION RAY;  Surgeon: Jamesetta So;  Location: [facility];  Service: General;  Laterality: Right;  Right fourth and fifth metatarsal   . APPENDECTOMY    . CESAREAN SECTION  2004 and 2007   x2  . TUBAL LIGATION    . TUBAL LIGATION      Family History  Problem Relation Age of Onset  . Diabetes Father   . Anesthesia problems Neg Hx   . Hypotension Neg Hx   . Malignant hyperthermia Neg Hx   . Pseudochol deficiency Neg Hx     Social History Social History  Substance Use Topics  . Smoking status: Former Smoker    Packs/day: 0.25    Years: 20.00    Types: Cigarettes    Quit date: 12/29/2013  . Smokeless tobacco: Never Used  . Alcohol  use No    No Known Allergies  Current Outpatient Prescriptions  Medication Sig Dispense Refill  . Blood Glucose Monitoring Suppl (ACCU-CHEK AVIVA) device Use as instructed 1 each 0  . canagliflozin (INVOKANA) 100 MG TABS tablet Take 1 tablet (100 mg total) by mouth daily with breakfast. 30 tablet 3  . cholecalciferol (VITAMIN D) 400 UNITS TABS tablet Take 400 Units by mouth.    . doxycycline (VIBRAMYCIN) 100 MG capsule Take 100 mg by mouth 2 (two) times daily.    Marland Kitchen gemfibrozil (LOPID) 600 MG tablet TAKE (1) TABLET BY MOUTH TWICE A DAY BEFORE MEALS. (BREAKFAST AND SUPPER) 60 tablet 2  . HYDROcodone-acetaminophen (NORCO) 7.5-325 MG tablet One every four hours for pain as needed.  Do not drive car or operate machinery while taking this medicine.  Must last 14 days. 50 tablet 0  . LANTUS SOLOSTAR 100 UNIT/ML Solostar Pen INNJECT 50 UNITS S.Q. ONCE DAILY AT 10 P.M. 15 mL 2  . lisinopril (PRINIVIL,ZESTRIL) 20 MG tablet Take 20 mg by mouth daily.    . metFORMIN (GLUCOPHAGE) 500 MG tablet TAKE 1 TABLET BY MOUTH TWICE DAILY WITH MEALS. 60 tablet 2  . nabumetone (RELAFEN) 750 MG tablet Take 1 tablet (750 mg total) by mouth 2 (two) times daily. 60 tablet 5  . NOVOLOG FLEXPEN 100 UNIT/ML  FlexPen INJECT 12-18 UNITS S.Q. THREE TIMES DAILY WITH MEALS. 15 mL 3  . Omega-3 Fatty Acids (FISH OIL) 1000 MG CAPS Take by mouth.    Marland Kitchen omeprazole (PRILOSEC) 20 MG capsule Take 20 mg by mouth daily.    . Potassium 99 MG TABS Take 1 tablet by mouth daily.    . sertraline (ZOLOFT) 25 MG tablet Take 50 mg by mouth daily.     Marland Kitchen sulfamethoxazole-trimethoprim (BACTRIM DS) 800-160 MG per tablet Take 1 tablet by mouth 2 (two) times daily. 20 tablet 0   No current facility-administered medications for this visit.      Physical Exam  Blood pressure (!) 142/88, pulse 95, temperature 97.3 F (36.3 C), resp. rate 18, height 5\' 10"  (1.778 m), weight 222 lb (100.7 kg).  Constitutional: overall normal hygiene, normal  nutrition, well developed, normal grooming, normal body habitus. Assistive device:none  Musculoskeletal: gait and station Limp none, muscle tone and strength are normal, no tremors or atrophy is present.  .  Neurological: coordination overall normal.  Deep tendon reflex/nerve stretch intact.  Sensation normal.  Cranial nerves II-XII intact.   Skin:   Normal overall no scars, lesions, ulcers or rashes. No psoriasis.  Psychiatric: Alert and oriented x 3.  Recent memory intact, remote memory unclear.  Normal mood and affect. Well groomed.  Good eye contact.  Cardiovascular: overall no swelling, no varicosities, no edema bilaterally, normal temperatures of the legs and arms, no clubbing, cyanosis and good capillary refill.  Lymphatic: palpation is normal.  Examination of right Upper Extremity is done.  Inspection:   Overall:  Elbow non-tender without crepitus or defects, forearm non-tender without crepitus or defects, wrist non-tender without crepitus or defects, hand non-tender.    Shoulder: with glenohumeral joint tenderness, without effusion.   Upper arm: without swelling and tenderness   Range of motion:   Overall:  Full range of motion of the elbow, full range of motion of wrist and full range of motion in fingers.   Shoulder:  right  165 degrees forward flexion; 145 degrees abduction; 35 degrees internal rotation, 35 degrees external rotation, 15 degrees extension, 40 degrees adduction.   Stability:   Overall:  Shoulder, elbow and wrist stable   Strength and Tone:   Overall full shoulder muscles strength, full upper arm strength and normal upper arm bulk and tone.   The patient has been educated about the nature of the problem(s) and counseled on treatment options.  The patient appeared to understand what I have discussed and is in agreement with it.  Encounter Diagnosis  Name Primary?  . Chronic right shoulder pain Yes    PLAN Call if any problems.  Precautions discussed.   Continue current medications.   Return to clinic 1 month   Electronically Signed Sanjuana Kava, MD 12/12/20172:53 PM

## 2016-11-10 ENCOUNTER — Telehealth: Payer: Self-pay | Admitting: Orthopaedic Surgery

## 2016-11-10 MED ORDER — HYDROCODONE-ACETAMINOPHEN 7.5-325 MG PO TABS: ORAL_TABLET | ORAL | 0 refills | Status: DC

## 2016-11-10 NOTE — Telephone Encounter (Signed)
Hydrocodone-Acetaminophen  7.5/325 mg  Qty 50 Tablets °

## 2016-12-02 ENCOUNTER — Telehealth: Payer: Self-pay | Admitting: Orthopaedic Surgery

## 2016-12-02 ENCOUNTER — Other Ambulatory Visit: Payer: Self-pay | Admitting: *Deleted

## 2016-12-02 MED ORDER — HYDROCODONE-ACETAMINOPHEN 7.5-325 MG PO TABS: ORAL_TABLET | ORAL | 0 refills | Status: DC

## 2016-12-02 NOTE — Telephone Encounter (Signed)
Hydrocodone-Acetaminophen 7.5/325mg   Qty 50 Tablets  One every four hours for pain as needed. Do not drive car or operate machinery while taking this medicine.  Must last 14 days.

## 2016-12-03 ENCOUNTER — Ambulatory Visit: Payer: Medicaid Other | Admitting: Orthopaedic Surgery

## 2016-12-10 ENCOUNTER — Ambulatory Visit: Payer: Medicaid Other | Admitting: Orthopaedic Surgery

## 2017-01-05 ENCOUNTER — Ambulatory Visit (INDEPENDENT_AMBULATORY_CARE_PROVIDER_SITE_OTHER): Payer: Medicaid Other | Admitting: Orthopaedic Surgery

## 2017-01-05 ENCOUNTER — Encounter: Payer: Self-pay | Admitting: Orthopaedic Surgery

## 2017-01-05 VITALS — BP 150/100 | HR 91 | Temp 97.7°F | Ht 70.0 in | Wt 213.0 lb

## 2017-01-05 DIAGNOSIS — M25511 Pain in right shoulder: Secondary | ICD-10-CM

## 2017-01-05 DIAGNOSIS — G8929 Other chronic pain: Secondary | ICD-10-CM | POA: Diagnosis not present

## 2017-01-05 MED ORDER — HYDROCODONE-ACETAMINOPHEN 7.5-325 MG PO TABS: 1.0000 | ORAL_TABLET | Freq: Four times a day (QID) | ORAL | 0 refills | Status: DC | PRN

## 2017-01-05 NOTE — Progress Notes (Signed)
Patient [id number] Anne [name], female DOB:11/07/1981, 36 y.o. [id number]  Chief Complaint  Patient presents with  . Follow-up    shoulder pain    HPI  Anne [name] is a 36 y.o. female who has chronic right shoulder pain.  She has no new trauma or new episodes.  She has no numbness. She has no redness. She has been taking her medicine and doing her exercises. HPI  Body mass index is 30.56 kg/m.  ROS  Review of Systems  Constitutional:       Patient has Diabetes Mellitus. Patient has hypertension. Patient does not have COPD or shortness of breath. Patient does not have BMI > 35. Patient does not have current smoking history.  HENT: Negative for congestion.   Respiratory: Negative for cough and shortness of breath.   Cardiovascular: Negative for chest pain and leg swelling.  Gastrointestinal:       GERD  Endocrine: Positive for cold intolerance.  Musculoskeletal: Positive for arthralgias, myalgias and neck pain.  Allergic/Immunologic: Positive for environmental allergies.    Past Medical History:  Diagnosis Date  . Acid reflux   . Diabetes mellitus   . Diabetes mellitus type 1 ([place]) 10/31/2011  . Hypercholesteremia   . Hypertension   . Sciatica     Past Surgical History:  Procedure Laterality Date  . AMPUTATION  11/03/2011   Procedure: AMPUTATION RAY;  Surgeon: Jamesetta So;  Location: [facility];  Service: General;  Laterality: Right;  Right fourth and fifth metatarsal   . APPENDECTOMY    . CESAREAN SECTION  2004 and 2007   x2  . TUBAL LIGATION    . TUBAL LIGATION      Family History  Problem Relation Age of Onset  . Diabetes Father   . Anesthesia problems Neg Hx   . Hypotension Neg Hx   . Malignant hyperthermia Neg Hx   . Pseudochol deficiency Neg Hx     Social History Social History  Substance Use Topics  . Smoking status: Former Smoker    Packs/day: 0.25    Years: 20.00    Types: Cigarettes    Quit date: 12/29/2013  . Smokeless tobacco: Never  Used  . Alcohol use No    No Known Allergies  Current Outpatient Prescriptions  Medication Sig Dispense Refill  . Blood Glucose Monitoring Suppl (ACCU-CHEK AVIVA) device Use as instructed 1 each 0  . canagliflozin (INVOKANA) 100 MG TABS tablet Take 1 tablet (100 mg total) by mouth daily with breakfast. 30 tablet 3  . cholecalciferol (VITAMIN D) 400 UNITS TABS tablet Take 400 Units by mouth.    . doxycycline (VIBRAMYCIN) 100 MG capsule Take 100 mg by mouth 2 (two) times daily.    Marland Kitchen gemfibrozil (LOPID) 600 MG tablet TAKE (1) TABLET BY MOUTH TWICE A DAY BEFORE MEALS. (BREAKFAST AND SUPPER) 60 tablet 2  . HYDROcodone-acetaminophen (NORCO) 7.5-325 MG tablet Take 1 tablet by mouth every 6 (six) hours as needed for moderate pain. Take one tablet by mouth every six hours as needed for pain,  Seven day limit per Medicaid rules. 28 tablet 0  . LANTUS SOLOSTAR 100 UNIT/ML Solostar Pen INNJECT 50 UNITS S.Q. ONCE DAILY AT 10 P.M. 15 mL 2  . lisinopril (PRINIVIL,ZESTRIL) 20 MG tablet Take 20 mg by mouth daily.    . metFORMIN (GLUCOPHAGE) 500 MG tablet TAKE 1 TABLET BY MOUTH TWICE DAILY WITH MEALS. 60 tablet 2  . NOVOLOG FLEXPEN 100 UNIT/ML FlexPen INJECT 12-18 UNITS S.Q. THREE TIMES DAILY  WITH MEALS. 15 mL 3  . Omega-3 Fatty Acids (FISH OIL) 1000 MG CAPS Take by mouth.    Marland Kitchen omeprazole (PRILOSEC) 20 MG capsule Take 20 mg by mouth daily.    . Potassium 99 MG TABS Take 1 tablet by mouth daily.    . sertraline (ZOLOFT) 25 MG tablet Take 50 mg by mouth daily.     Marland Kitchen sulfamethoxazole-trimethoprim (BACTRIM DS) 800-160 MG per tablet Take 1 tablet by mouth 2 (two) times daily. 20 tablet 0   No current facility-administered medications for this visit.      Physical Exam  Blood pressure (!) 150/100, pulse 91, temperature 97.7 F (36.5 C), height 5\' 10"  (1.778 m), weight 213 lb (96.6 kg).  Constitutional: overall normal hygiene, normal nutrition, well developed, normal grooming, normal body habitus. Assistive  device:none  Musculoskeletal: gait and station Limp none, muscle tone and strength are normal, no tremors or atrophy is present.  .  Neurological: coordination overall normal.  Deep tendon reflex/nerve stretch intact.  Sensation normal.  Cranial nerves II-XII intact.   Skin:   Normal overall no scars, lesions, ulcers or rashes. No psoriasis.  Psychiatric: Alert and oriented x 3.  Recent memory intact, remote memory unclear.  Normal mood and affect. Well groomed.  Good eye contact.  Cardiovascular: overall no swelling, no varicosities, no edema bilaterally, normal temperatures of the legs and arms, no clubbing, cyanosis and good capillary refill.  Lymphatic: palpation is normal.  Examination of right Upper Extremity is done.  Inspection:   Overall:  Elbow non-tender without crepitus or defects, forearm non-tender without crepitus or defects, wrist non-tender without crepitus or defects, hand non-tender.    Shoulder: with glenohumeral joint tenderness, without effusion.   Upper arm: without swelling and tenderness   Range of motion:   Overall:  Full range of motion of the elbow, full range of motion of wrist and full range of motion in fingers.   Shoulder:  right  165 degrees forward flexion; 145 degrees abduction; 35 degrees internal rotation, 35 degrees external rotation, 15 degrees extension, 40 degrees adduction.   Stability:   Overall:  Shoulder, elbow and wrist stable   Strength and Tone:   Overall full shoulder muscles strength, full upper arm strength and normal upper arm bulk and tone.   The patient has been educated about the nature of the problem(s) and counseled on treatment options.  The patient appeared to understand what I have discussed and is in agreement with it.  Encounter Diagnosis  Name Primary?  . Chronic right shoulder pain Yes    PLAN Call if any problems.  Precautions discussed.  Continue current medications.   Return to clinic 1 month   I have  reviewed the [place] web site prior to prescribing narcotic medicine for this patient.  Electronically Signed Sanjuana Kava, MD 2/13/20182:48 PM

## 2017-01-14 ENCOUNTER — Telehealth: Payer: Self-pay | Admitting: Orthopaedic Surgery

## 2017-01-14 MED ORDER — HYDROCODONE-ACETAMINOPHEN 7.5-325 MG PO TABS: 1.0000 | ORAL_TABLET | Freq: Four times a day (QID) | ORAL | 0 refills | Status: DC | PRN

## 2017-01-14 NOTE — Telephone Encounter (Signed)
Patient requests refill:  HYDROcodone-acetaminophen (NORCO) 7.5-325 MG tablet 28 tablet

## 2017-01-14 NOTE — Telephone Encounter (Signed)
Note had been re-routed to our provider, J. Sanjuana Kava, MD 01/14/17, 3:35p.m.  Refill request completed, and picked up by patient.

## 2017-01-14 NOTE — Telephone Encounter (Signed)
Not an [facility] patient.  No medications provided.

## 2017-01-14 NOTE — Telephone Encounter (Signed)
Re-route to provider J.Wayne Lindaann Slough

## 2017-01-19 MED ORDER — HYDROCODONE-ACETAMINOPHEN 7.5-325 MG PO TABS: 1.0000 | ORAL_TABLET | Freq: Four times a day (QID) | ORAL | 0 refills | Status: DC | PRN

## 2017-02-02 ENCOUNTER — Ambulatory Visit: Payer: Medicaid Other | Admitting: Orthopaedic Surgery

## 2017-02-03 ENCOUNTER — Encounter: Payer: Self-pay | Admitting: Orthopaedic Surgery

## 2017-02-03 ENCOUNTER — Ambulatory Visit (INDEPENDENT_AMBULATORY_CARE_PROVIDER_SITE_OTHER): Payer: Medicaid Other | Admitting: Orthopaedic Surgery

## 2017-02-03 ENCOUNTER — Other Ambulatory Visit: Payer: Self-pay | Admitting: Podiatry

## 2017-02-03 VITALS — BP 132/84 | HR 96 | Ht 70.0 in | Wt 215.0 lb

## 2017-02-03 DIAGNOSIS — M25511 Pain in right shoulder: Secondary | ICD-10-CM

## 2017-02-03 DIAGNOSIS — G8929 Other chronic pain: Secondary | ICD-10-CM | POA: Diagnosis not present

## 2017-02-03 DIAGNOSIS — M86171 Other acute osteomyelitis, right ankle and foot: Secondary | ICD-10-CM

## 2017-02-03 MED ORDER — HYDROCODONE-ACETAMINOPHEN 7.5-325 MG PO TABS: 1.0000 | ORAL_TABLET | Freq: Four times a day (QID) | ORAL | 0 refills | Status: DC | PRN

## 2017-02-03 NOTE — Progress Notes (Signed)
Patient [id number] Anne [name], Anne [name] DOB:[birth year], 36 y.o. [id number]  Chief Complaint  Patient presents with  . Follow-up    right shoulder    HPI  Anne [name] is a 36 y.o. Anne [name] who has chronic right shoulder pain.  She has more pain with overhead use.  She has no paresthesias.  She has no new trauma. She is doing her exercises. HPI  Body mass index is 30.85 kg/m.  ROS  Review of Systems  Constitutional:       Patient has Diabetes Mellitus. Patient has hypertension. Patient does not have COPD or shortness of breath. Patient does not have BMI > 35. Patient does not have current smoking history.  HENT: Negative for congestion.   Respiratory: Negative for cough and shortness of breath.   Cardiovascular: Negative for chest pain and leg swelling.  Gastrointestinal:       GERD  Endocrine: Positive for cold intolerance.  Musculoskeletal: Positive for arthralgias, myalgias and neck pain.  Allergic/Immunologic: Positive for environmental allergies.    Past Medical History:  Diagnosis Date  . Acid reflux   . Diabetes mellitus   . Diabetes mellitus type 1 ([place]) 10/31/2011  . Hypercholesteremia   . Hypertension   . Sciatica     Past Surgical History:  Procedure Laterality Date  . AMPUTATION  11/03/2011   Procedure: AMPUTATION RAY;  Surgeon: Jamesetta So;  Location: [facility];  Service: General;  Laterality: Right;  Right fourth and fifth metatarsal   . APPENDECTOMY    . CESAREAN SECTION  2004 and 2007   x2  . TUBAL LIGATION    . TUBAL LIGATION      Family History  Problem Relation Age of Onset  . Diabetes Father   . Anesthesia problems Neg Hx   . Hypotension Neg Hx   . Malignant hyperthermia Neg Hx   . Pseudochol deficiency Neg Hx     Social History Social History  Substance Use Topics  . Smoking status: Former Smoker    Packs/day: 0.25    Years: 20.00    Types: Cigarettes    Quit date: 12/29/2013  . Smokeless tobacco: Never Used  . Alcohol use No     No Known Allergies  Current Outpatient Prescriptions  Medication Sig Dispense Refill  . Blood Glucose Monitoring Suppl (ACCU-CHEK AVIVA) device Use as instructed 1 each 0  . canagliflozin (INVOKANA) 100 MG TABS tablet Take 1 tablet (100 mg total) by mouth daily with breakfast. 30 tablet 3  . cholecalciferol (VITAMIN D) 400 UNITS TABS tablet Take 400 Units by mouth.    . doxycycline (VIBRAMYCIN) 100 MG capsule Take 100 mg by mouth 2 (two) times daily.    Marland Kitchen gemfibrozil (LOPID) 600 MG tablet TAKE (1) TABLET BY MOUTH TWICE A DAY BEFORE MEALS. (BREAKFAST AND SUPPER) 60 tablet 2  . HYDROcodone-acetaminophen (NORCO) 7.5-325 MG tablet Take 1 tablet by mouth every 6 (six) hours as needed for moderate pain. Take one tablet by mouth every six hours as needed for pain,  Seven day limit per Medicaid rules. 28 tablet 0  . LANTUS SOLOSTAR 100 UNIT/ML Solostar Pen INNJECT 50 UNITS S.Q. ONCE DAILY AT 10 P.M. 15 mL 2  . lisinopril (PRINIVIL,ZESTRIL) 20 MG tablet Take 20 mg by mouth daily.    . metFORMIN (GLUCOPHAGE) 500 MG tablet TAKE 1 TABLET BY MOUTH TWICE DAILY WITH MEALS. 60 tablet 2  . NOVOLOG FLEXPEN 100 UNIT/ML FlexPen INJECT 12-18 UNITS S.Q. THREE TIMES DAILY WITH MEALS. 15 mL  3  . Omega-3 Fatty Acids (FISH OIL) 1000 MG CAPS Take by mouth.    Marland Kitchen omeprazole (PRILOSEC) 20 MG capsule Take 20 mg by mouth daily.    . Potassium 99 MG TABS Take 1 tablet by mouth daily.    . sertraline (ZOLOFT) 25 MG tablet Take 50 mg by mouth daily.     Marland Kitchen sulfamethoxazole-trimethoprim (BACTRIM DS) 800-160 MG per tablet Take 1 tablet by mouth 2 (two) times daily. 20 tablet 0   No current facility-administered medications for this visit.      Physical Exam  Blood pressure 132/84, pulse 96, height 5\' 10"  (1.778 m), weight 215 lb (97.5 kg).  Constitutional: overall normal hygiene, normal nutrition, well developed, normal grooming, normal body habitus. Assistive device:none  Musculoskeletal: gait and station Limp none,  muscle tone and strength are normal, no tremors or atrophy is present.  .  Neurological: coordination overall normal.  Deep tendon reflex/nerve stretch intact.  Sensation normal.  Cranial nerves II-XII intact.   Skin:   Normal overall no scars, lesions, ulcers or rashes. No psoriasis.  Psychiatric: Alert and oriented x 3.  Recent memory intact, remote memory unclear.  Normal mood and affect. Well groomed.  Good eye contact.  Cardiovascular: overall no swelling, no varicosities, no edema bilaterally, normal temperatures of the legs and arms, no clubbing, cyanosis and good capillary refill.  Lymphatic: palpation is normal.  Examination of right Upper Extremity is done.  Inspection:   Overall:  Elbow non-tender without crepitus or defects, forearm non-tender without crepitus or defects, wrist non-tender without crepitus or defects, hand non-tender.    Shoulder: with glenohumeral joint tenderness, without effusion.   Upper arm: without swelling and tenderness   Range of motion:   Overall:  Full range of motion of the elbow, full range of motion of wrist and full range of motion in fingers.   Shoulder:  right  165 degrees forward flexion; 140 degrees abduction; 35 degrees internal rotation, 35 degrees external rotation, 20 degrees extension, 40 degrees adduction.   Stability:   Overall:  Shoulder, elbow and wrist stable   Strength and Tone:   Overall full shoulder muscles strength, full upper arm strength and normal upper arm bulk and tone.   The patient has been educated about the nature of the problem(s) and counseled on treatment options.  The patient appeared to understand what I have discussed and is in agreement with it.  Encounter Diagnosis  Name Primary?  . Chronic right shoulder pain Yes    PLAN Call if any problems.  Precautions discussed.  Continue current medications.   Return to clinic 1 month   I have reviewed the [place]  web site prior to prescribing narcotic medicine for this patient.  Electronically Signed Sanjuana Kava, MD 3/14/20184:11 PM

## 2017-02-17 ENCOUNTER — Telehealth: Payer: Self-pay | Admitting: Orthopedic Surgery

## 2017-02-17 NOTE — Telephone Encounter (Signed)
Patient requests refill on Hydrocodone/Acetaminophen 7.5-325 mgs.   Qty  28  Sig: Take 1 tablet by mouth every 6 (six) hours as needed for moderate pain. Take one tablet by mouth every six hours as needed for pain, Seven day limit per Medicaid rules.

## 2017-02-18 MED ORDER — HYDROCODONE-ACETAMINOPHEN 7.5-325 MG PO TABS: 1.0000 | ORAL_TABLET | Freq: Four times a day (QID) | ORAL | 0 refills | Status: DC | PRN

## 2017-03-03 ENCOUNTER — Other Ambulatory Visit: Payer: Medicaid Other

## 2017-03-03 ENCOUNTER — Ambulatory Visit: Payer: Medicaid Other | Admitting: Orthopaedic Surgery

## 2017-03-17 ENCOUNTER — Ambulatory Visit: Payer: Medicaid Other | Admitting: Orthopaedic Surgery

## 2017-03-18 ENCOUNTER — Encounter: Payer: Self-pay | Admitting: Orthopaedic Surgery

## 2017-03-18 ENCOUNTER — Ambulatory Visit (INDEPENDENT_AMBULATORY_CARE_PROVIDER_SITE_OTHER): Payer: Medicaid Other | Admitting: Orthopaedic Surgery

## 2017-03-18 VITALS — BP 121/82 | HR 88 | Temp 97.3°F

## 2017-03-18 DIAGNOSIS — G8929 Other chronic pain: Secondary | ICD-10-CM | POA: Diagnosis not present

## 2017-03-18 DIAGNOSIS — M25511 Pain in right shoulder: Secondary | ICD-10-CM | POA: Diagnosis not present

## 2017-03-18 MED ORDER — HYDROCODONE-ACETAMINOPHEN 7.5-325 MG PO TABS: 1.0000 | ORAL_TABLET | Freq: Four times a day (QID) | ORAL | 0 refills | Status: DC | PRN

## 2017-03-18 NOTE — Progress Notes (Signed)
Patient Anne [name], female DOB:[birth year], 36 y.o. ACZ:[id number]  Chief Complaint  Patient presents with  . Follow-up    right shoulder pain    HPI  Anne [name] is a 36 y.o. female who has chronic right shoulder pain.  She has no new trauma, no paresthesias.  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  She is out of her pain medicine.  She needs to use her right upper extremity for ambulation with crutches.  This has made her right shoulder more tender. HPI  There is no height or weight on file to calculate BMI.  ROS  Review of Systems  Constitutional:       Patient has Diabetes Mellitus. Patient has hypertension. Patient does not have COPD or shortness of breath. Patient does not have BMI > 35. Patient does not have current smoking history.  HENT: Negative for congestion.   Respiratory: Negative for cough and shortness of breath.   Cardiovascular: Negative for chest pain and leg swelling.  Gastrointestinal:       GERD  Endocrine: Positive for cold intolerance.  Musculoskeletal: Positive for arthralgias, myalgias and neck pain.  Allergic/Immunologic: Positive for environmental allergies.    Past Medical History:  Diagnosis Date  . Acid reflux   . Diabetes mellitus   . Diabetes mellitus type 1 ([place]) 10/31/2011  . Hypercholesteremia   . Hypertension   . Sciatica     Past Surgical History:  Procedure Laterality Date  . AMPUTATION  11/03/2011   Procedure: AMPUTATION RAY;  Surgeon: Jamesetta So;  Location: [facility];  Service: General;  Laterality: Right;  Right fourth and fifth metatarsal   . APPENDECTOMY    . CESAREAN SECTION  2004 and 2007   x2  . TUBAL LIGATION    . TUBAL LIGATION      Family History  Problem Relation Age of Onset  . Diabetes Father   . Anesthesia problems Neg Hx   . Hypotension Neg Hx   . Malignant hyperthermia Neg Hx   . Pseudochol deficiency Neg Hx     Social History Social History  Substance  Use Topics  . Smoking status: Former Smoker    Packs/day: 0.25    Years: 20.00    Types: Cigarettes    Quit date: 12/29/2013  . Smokeless tobacco: Never Used  . Alcohol use No    No Known Allergies  Current Outpatient Prescriptions  Medication Sig Dispense Refill  . Blood Glucose Monitoring Suppl (ACCU-CHEK AVIVA) device Use as instructed 1 each 0  . canagliflozin (INVOKANA) 100 MG TABS tablet Take 1 tablet (100 mg total) by mouth daily with breakfast. 30 tablet 3  . cholecalciferol (VITAMIN D) 400 UNITS TABS tablet Take 400 Units by mouth.    . doxycycline (VIBRAMYCIN) 100 MG capsule Take 100 mg by mouth 2 (two) times daily.    Marland Kitchen gemfibrozil (LOPID) 600 MG tablet TAKE (1) TABLET BY MOUTH TWICE A DAY BEFORE MEALS. (BREAKFAST AND SUPPER) 60 tablet 2  . HYDROcodone-acetaminophen (NORCO) 7.5-325 MG tablet Take 1 tablet by mouth every 6 (six) hours as needed for moderate pain. Take one tablet by mouth every six hours as needed for pain,  Seven day limit per Medicaid rules. 28 tablet 0  . LANTUS SOLOSTAR 100 UNIT/ML Solostar Pen INNJECT 50 UNITS S.Q. ONCE DAILY AT 10 P.M. 15 mL 2  . lisinopril (PRINIVIL,ZESTRIL) 20 MG tablet Take 20 mg by mouth daily.    . metFORMIN (GLUCOPHAGE) 500 MG  tablet TAKE 1 TABLET BY MOUTH TWICE DAILY WITH MEALS. 60 tablet 2  . NOVOLOG FLEXPEN 100 UNIT/ML FlexPen INJECT 12-18 UNITS S.Q. THREE TIMES DAILY WITH MEALS. 15 mL 3  . Omega-3 Fatty Acids (FISH OIL) 1000 MG CAPS Take by mouth.    Marland Kitchen omeprazole (PRILOSEC) 20 MG capsule Take 20 mg by mouth daily.    . Potassium 99 MG TABS Take 1 tablet by mouth daily.    . sertraline (ZOLOFT) 25 MG tablet Take 50 mg by mouth daily.     Marland Kitchen sulfamethoxazole-trimethoprim (BACTRIM DS) 800-160 MG per tablet Take 1 tablet by mouth 2 (two) times daily. 20 tablet 0   No current facility-administered medications for this visit.      Physical Exam  Blood pressure 121/82, pulse 88, temperature 97.3 F (36.3 C).  Constitutional:  overall normal hygiene, normal nutrition, well developed, normal grooming, normal body habitus. Assistive device:CAM walker and crutches  Musculoskeletal: gait and station Limp right, muscle tone and strength are normal, no tremors or atrophy is present.  .  Neurological: coordination overall normal.  Deep tendon reflex/nerve stretch intact.  Sensation normal.  Cranial nerves II-XII intact.   Skin:   Normal overall no scars, lesions, ulcers or rashes. No psoriasis.  Psychiatric: Alert and oriented x 3.  Recent memory intact, remote memory unclear.  Normal mood and affect. Well groomed.  Good eye contact.  Cardiovascular: overall no swelling, no varicosities, no edema bilaterally, normal temperatures of the legs and arms, no clubbing, cyanosis and good capillary refill.  Lymphatic: palpation is normal.  Examination of right Upper Extremity is done.  Inspection:   Overall:  Elbow non-tender without crepitus or defects, forearm non-tender without crepitus or defects, wrist non-tender without crepitus or defects, hand non-tender.    Shoulder: with glenohumeral joint tenderness, without effusion.   Upper arm: without swelling and tenderness   Range of motion:   Overall:  Full range of motion of the elbow, full range of motion of wrist and full range of motion in fingers.   Shoulder:  right  165 degrees forward flexion; 145 degrees abduction; 35 degrees internal rotation, 35 degrees external rotation, 15 degrees extension, 40 degrees adduction.   Stability:   Overall:  Shoulder, elbow and wrist stable   Strength and Tone:   Overall full shoulder muscles strength, full upper arm strength and normal upper arm bulk and tone.   The patient has been educated about the nature of the problem(s) and counseled on treatment options.  The patient appeared to understand what I have discussed and is in agreement with it.  Encounter Diagnosis  Name Primary?  . Chronic right shoulder pain Yes     PLAN Call if any problems.  Precautions discussed.  Continue current medications.   Return to clinic 1 month   I have reviewed the [place] web site prior to prescribing narcotic medicine for this patient.  Electronically Signed Sanjuana Kava, MD 4/26/201810:49 AM

## 2017-03-29 ENCOUNTER — Telehealth: Payer: Self-pay | Admitting: Orthopaedic Surgery

## 2017-03-29 NOTE — Telephone Encounter (Signed)
Patient requests refill on Hydrocodone/Acetaminophen ([place]co)  7.5-325  mgs.   Qty  28   Sig: Take 1 tablet by mouth every 6 (six) hours as needed for moderate pain. Take one tablet by mouth every six hours as needed for pain, Seven day limit per Medicaid rules.

## 2017-03-30 MED ORDER — HYDROCODONE-ACETAMINOPHEN 7.5-325 MG PO TABS: 1.0000 | ORAL_TABLET | Freq: Four times a day (QID) | ORAL | 0 refills | Status: DC | PRN

## 2017-04-07 ENCOUNTER — Telehealth: Payer: Self-pay | Admitting: Orthopaedic Surgery

## 2017-04-07 MED ORDER — HYDROCODONE-ACETAMINOPHEN 7.5-325 MG PO TABS: 1.0000 | ORAL_TABLET | Freq: Four times a day (QID) | ORAL | 0 refills | Status: DC | PRN

## 2017-04-07 NOTE — Telephone Encounter (Signed)
Hydrocodone-Acetaminophen  7.5/325 mg  Qty 24 Tablets

## 2017-04-15 ENCOUNTER — Ambulatory Visit (INDEPENDENT_AMBULATORY_CARE_PROVIDER_SITE_OTHER): Payer: Medicaid Other | Admitting: Orthopaedic Surgery

## 2017-04-15 ENCOUNTER — Encounter: Payer: Self-pay | Admitting: Orthopaedic Surgery

## 2017-04-15 VITALS — BP 126/80 | HR 85 | Temp 97.5°F

## 2017-04-15 DIAGNOSIS — G8929 Other chronic pain: Secondary | ICD-10-CM

## 2017-04-15 DIAGNOSIS — M25511 Pain in right shoulder: Secondary | ICD-10-CM

## 2017-04-15 NOTE — Progress Notes (Signed)
Patient WE:Anne [name], female DOB:September 01, 1981, 36 y.o. CVE:[id number]  Chief Complaint  Patient presents with  . Shoulder Pain    right    HPI  Anne [name] is a 36 y.o. female who has chronic right shoulder pain.  She has no new trauma, no weakness.  She has no swelling.  She is taking her medicine.  She is in a CAM walker for the right lower foot for another problem being treated elsewhere.  She says she is developing a tremor of the right hand.  I have recommended she see neurologist.  Parkinson disease runs in the family. HPI  There is no height or weight on file to calculate BMI.  ROS  Review of Systems  Constitutional:       Patient has Diabetes Mellitus. Patient has hypertension. Patient does not have COPD or shortness of breath. Patient does not have BMI > 35. Patient does not have current smoking history.  HENT: Negative for congestion.   Respiratory: Negative for cough and shortness of breath.   Cardiovascular: Negative for chest pain and leg swelling.  Gastrointestinal:       GERD  Endocrine: Positive for cold intolerance.  Musculoskeletal: Positive for arthralgias, myalgias and neck pain.  Allergic/Immunologic: Positive for environmental allergies.    Past Medical History:  Diagnosis Date  . Acid reflux   . Diabetes mellitus   . Diabetes mellitus type 1 ([place]) 10/31/2011  . Hypercholesteremia   . Hypertension   . Sciatica     Past Surgical History:  Procedure Laterality Date  . AMPUTATION  11/03/2011   Procedure: AMPUTATION RAY;  Surgeon: Jamesetta So;  Location: [facility];  Service: General;  Laterality: Right;  Right fourth and fifth metatarsal   . APPENDECTOMY    . CESAREAN SECTION  2004 and 2007   x2  . TUBAL LIGATION    . TUBAL LIGATION      Family History  Problem Relation Age of Onset  . Diabetes Father   . Anesthesia problems Neg Hx   . Hypotension Neg Hx   . Malignant hyperthermia Neg Hx   . Pseudochol deficiency Neg Hx      Social History Social History  Substance Use Topics  . Smoking status: Former Smoker    Packs/day: 0.25    Years: 20.00    Types: Cigarettes    Quit date: 12/29/2013  . Smokeless tobacco: Never Used  . Alcohol use No    No Known Allergies  Current Outpatient Prescriptions  Medication Sig Dispense Refill  . Blood Glucose Monitoring Suppl (ACCU-CHEK AVIVA) device Use as instructed 1 each 0  . canagliflozin (INVOKANA) 100 MG TABS tablet Take 1 tablet (100 mg total) by mouth daily with breakfast. 30 tablet 3  . cholecalciferol (VITAMIN D) 400 UNITS TABS tablet Take 400 Units by mouth.    . doxycycline (VIBRAMYCIN) 100 MG capsule Take 100 mg by mouth 2 (two) times daily.    Marland Kitchen gemfibrozil (LOPID) 600 MG tablet TAKE (1) TABLET BY MOUTH TWICE A DAY BEFORE MEALS. (BREAKFAST AND SUPPER) 60 tablet 2  . HYDROcodone-acetaminophen (NORCO) 7.5-325 MG tablet Take 1 tablet by mouth every 6 (six) hours as needed for moderate pain. Take one tablet by mouth every six hours as needed for pain,  Seven day limit per Medicaid rules. 20 tablet 0  . LANTUS SOLOSTAR 100 UNIT/ML Solostar Pen INNJECT 50 UNITS S.Q. ONCE DAILY AT 10 P.M. 15 mL 2  . lisinopril (PRINIVIL,ZESTRIL) 20 MG tablet Take  20 mg by mouth daily.    . metFORMIN (GLUCOPHAGE) 500 MG tablet TAKE 1 TABLET BY MOUTH TWICE DAILY WITH MEALS. 60 tablet 2  . NOVOLOG FLEXPEN 100 UNIT/ML FlexPen INJECT 12-18 UNITS S.Q. THREE TIMES DAILY WITH MEALS. 15 mL 3  . Omega-3 Fatty Acids (FISH OIL) 1000 MG CAPS Take by mouth.    Marland Kitchen omeprazole (PRILOSEC) 20 MG capsule Take 20 mg by mouth daily.    . Potassium 99 MG TABS Take 1 tablet by mouth daily.    . sertraline (ZOLOFT) 25 MG tablet Take 50 mg by mouth daily.     Marland Kitchen sulfamethoxazole-trimethoprim (BACTRIM DS) 800-160 MG per tablet Take 1 tablet by mouth 2 (two) times daily. 20 tablet 0   No current facility-administered medications for this visit.      Physical Exam  Blood pressure 126/80, pulse 85,  temperature 97.5 F (36.4 C).  Constitutional: overall normal hygiene, normal nutrition, well developed, normal grooming, normal body habitus. Assistive device:CAM walker right  Musculoskeletal: gait and station Limp right, muscle tone and strength are normal, no tremors or atrophy is present.  .  Neurological: coordination overall normal.  Deep tendon reflex/nerve stretch intact.  Sensation normal.  Cranial nerves II-XII intact.   Skin:   Normal overall no scars, lesions, ulcers or rashes. No psoriasis.  Psychiatric: Alert and oriented x 3.  Recent memory intact, remote memory unclear.  Normal mood and affect. Well groomed.  Good eye contact.  Cardiovascular: overall no swelling, no varicosities, no edema bilaterally, normal temperatures of the legs and arms, no clubbing, cyanosis and good capillary refill.  Lymphatic: palpation is normal.  Examination of right Upper Extremity is done.  Inspection:   Overall:  Elbow non-tender without crepitus or defects, forearm non-tender without crepitus or defects, wrist non-tender without crepitus or defects, hand non-tender.    Shoulder: with glenohumeral joint tenderness, without effusion.   Upper arm: without swelling and tenderness   Range of motion:   Overall:  Full range of motion of the elbow, full range of motion of wrist and full range of motion in fingers.   Shoulder:  right  145 degrees forward flexion; 120 degrees abduction; 30 degrees internal rotation, 30 degrees external rotation, 15 degrees extension, 40 degrees adduction.   Stability:   Overall:  Shoulder, elbow and wrist stable   Strength and Tone:   Overall full shoulder muscles strength, full upper arm strength and normal upper arm bulk and tone.   The patient has been educated about the nature of the problem(s) and counseled on treatment options.  The patient appeared to understand what I have discussed and is in agreement with it.  Encounter Diagnosis  Name Primary?   . Chronic right shoulder pain Yes    PLAN Call if any problems.  Precautions discussed.  Continue current medications.   Return to clinic 1 month   Electronically Signed Sanjuana Kava, MD 5/24/201810:47 AM

## 2017-04-21 ENCOUNTER — Telehealth: Payer: Self-pay | Admitting: Orthopaedic Surgery

## 2017-04-21 MED ORDER — HYDROCODONE-ACETAMINOPHEN 7.5-325 MG PO TABS: 1.0000 | ORAL_TABLET | Freq: Four times a day (QID) | ORAL | 0 refills | Status: DC | PRN

## 2017-04-21 NOTE — Telephone Encounter (Signed)
Patient requests refill on Hydrocodone/Acetaminophen (Norco)  7.5-325 mgs.   Qty  20  Sig: Take 1 tablet by mouth every 6 (six) hours as needed for moderate pain. Take one tablet by mouth every six hours as needed for pain, Seven day limit per Medicaid rules.

## 2017-05-04 ENCOUNTER — Telehealth: Payer: Self-pay | Admitting: Orthopaedic Surgery

## 2017-05-04 NOTE — Telephone Encounter (Signed)
Hydrocodone-Acetaminophen  7.5/325 mg  Qty 20

## 2017-05-05 MED ORDER — HYDROCODONE-ACETAMINOPHEN 7.5-325 MG PO TABS: 1.0000 | ORAL_TABLET | Freq: Four times a day (QID) | ORAL | 0 refills | Status: DC | PRN

## 2017-05-13 ENCOUNTER — Ambulatory Visit (INDEPENDENT_AMBULATORY_CARE_PROVIDER_SITE_OTHER): Payer: Medicaid Other | Admitting: Orthopaedic Surgery

## 2017-05-13 ENCOUNTER — Encounter: Payer: Self-pay | Admitting: Orthopaedic Surgery

## 2017-05-13 VITALS — BP 154/90 | HR 70 | Temp 97.0°F | Ht 70.0 in | Wt 223.0 lb

## 2017-05-13 DIAGNOSIS — M25511 Pain in right shoulder: Secondary | ICD-10-CM | POA: Diagnosis not present

## 2017-05-13 DIAGNOSIS — G8929 Other chronic pain: Secondary | ICD-10-CM

## 2017-05-13 MED ORDER — HYDROCODONE-ACETAMINOPHEN 7.5-325 MG PO TABS: 1.0000 | ORAL_TABLET | Freq: Four times a day (QID) | ORAL | 0 refills | Status: DC | PRN

## 2017-05-13 NOTE — Progress Notes (Signed)
Patient Anne [name], female DOB:1981/04/17, 36 y.o. ASN:[id number]  Chief Complaint  Patient presents with  . Follow-up    Shoulder pain    HPI  Anne [name] is a 36 y.o. female who has chronic right shoulder pain.  She has no paresthesias, no new trauma, no redness.  She is doing her exercises and taking her medicine. HPI  Body mass index is 32 kg/m.  ROS  Review of Systems  Constitutional:       Patient has Diabetes Mellitus. Patient has hypertension. Patient does not have COPD or shortness of breath. Patient does not have BMI > 35. Patient does not have current smoking history.  HENT: Negative for congestion.   Respiratory: Negative for cough and shortness of breath.   Cardiovascular: Negative for chest pain and leg swelling.  Gastrointestinal:       GERD  Endocrine: Positive for cold intolerance.  Musculoskeletal: Positive for arthralgias, myalgias and neck pain.  Allergic/Immunologic: Positive for environmental allergies.    Past Medical History:  Diagnosis Date  . Acid reflux   . Diabetes mellitus   . Diabetes mellitus type 1 ([place]) 10/31/2011  . Hypercholesteremia   . Hypertension   . Sciatica     Past Surgical History:  Procedure Laterality Date  . AMPUTATION  11/03/2011   Procedure: AMPUTATION RAY;  Surgeon: Jamesetta So;  Location: [facility];  Service: General;  Laterality: Right;  Right fourth and fifth metatarsal   . APPENDECTOMY    . CESAREAN SECTION  2004 and 2007   x2  . TUBAL LIGATION    . TUBAL LIGATION      Family History  Problem Relation Age of Onset  . Diabetes Father   . Anesthesia problems Neg Hx   . Hypotension Neg Hx   . Malignant hyperthermia Neg Hx   . Pseudochol deficiency Neg Hx     Social History Social History  Substance Use Topics  . Smoking status: Former Smoker    Packs/day: 0.25    Years: 20.00    Types: Cigarettes    Quit date: 12/29/2013  . Smokeless tobacco: Never Used  . Alcohol use No    No Known  Allergies  Current Outpatient Prescriptions  Medication Sig Dispense Refill  . Blood Glucose Monitoring Suppl (ACCU-CHEK AVIVA) device Use as instructed 1 each 0  . canagliflozin (INVOKANA) 100 MG TABS tablet Take 1 tablet (100 mg total) by mouth daily with breakfast. 30 tablet 3  . cholecalciferol (VITAMIN D) 400 UNITS TABS tablet Take 400 Units by mouth.    . doxycycline (VIBRAMYCIN) 100 MG capsule Take 100 mg by mouth 2 (two) times daily.    Marland Kitchen gemfibrozil (LOPID) 600 MG tablet TAKE (1) TABLET BY MOUTH TWICE A DAY BEFORE MEALS. (BREAKFAST AND SUPPER) 60 tablet 2  . HYDROcodone-acetaminophen (NORCO) 7.5-325 MG tablet Take 1 tablet by mouth every 6 (six) hours as needed for moderate pain. Take one tablet by mouth every six hours as needed for pain,  Seven day limit per Medicaid rules. 20 tablet 0  . LANTUS SOLOSTAR 100 UNIT/ML Solostar Pen INNJECT 50 UNITS S.Q. ONCE DAILY AT 10 P.M. 15 mL 2  . lisinopril (PRINIVIL,ZESTRIL) 20 MG tablet Take 20 mg by mouth daily.    . metFORMIN (GLUCOPHAGE) 500 MG tablet TAKE 1 TABLET BY MOUTH TWICE DAILY WITH MEALS. 60 tablet 2  . NOVOLOG FLEXPEN 100 UNIT/ML FlexPen INJECT 12-18 UNITS S.Q. THREE TIMES DAILY WITH MEALS. 15 mL 3  . Omega-3  Fatty Acids (FISH OIL) 1000 MG CAPS Take by mouth.    Marland Kitchen omeprazole (PRILOSEC) 20 MG capsule Take 20 mg by mouth daily.    . Potassium 99 MG TABS Take 1 tablet by mouth daily.    . sertraline (ZOLOFT) 25 MG tablet Take 50 mg by mouth daily.     Marland Kitchen sulfamethoxazole-trimethoprim (BACTRIM DS) 800-160 MG per tablet Take 1 tablet by mouth 2 (two) times daily. 20 tablet 0   No current facility-administered medications for this visit.      Physical Exam  Blood pressure (!) 154/90, pulse 70, temperature 97 F (36.1 C), height 5\' 10"  (1.778 m), weight 223 lb (101.2 kg).  Constitutional: overall normal hygiene, normal nutrition, well developed, normal grooming, normal body habitus. Assistive device:Cam walker on the right for  condition treated elsewhere  Musculoskeletal: gait and station Limp rihgt, muscle tone and strength are normal, no tremors or atrophy is present.  .  Neurological: coordination overall normal.  Deep tendon reflex/nerve stretch intact.  Sensation normal.  Cranial nerves II-XII intact.   Skin:   Normal overall no scars, lesions, ulcers or rashes. No psoriasis.  Psychiatric: Alert and oriented x 3.  Recent memory intact, remote memory unclear.  Normal mood and affect. Well groomed.  Good eye contact.  Cardiovascular: overall no swelling, no varicosities, no edema bilaterally, normal temperatures of the legs and arms, no clubbing, cyanosis and good capillary refill.  Lymphatic: palpation is normal.  Examination of right Upper Extremity is done.  Inspection:   Overall:  Elbow non-tender without crepitus or defects, forearm non-tender without crepitus or defects, wrist non-tender without crepitus or defects, hand non-tender.    Shoulder: with glenohumeral joint tenderness, without effusion.   Upper arm: without swelling and tenderness   Range of motion:   Overall:  Full range of motion of the elbow, full range of motion of wrist and full range of motion in fingers.   Shoulder:  right  165 degrees forward flexion; 150 degrees abduction; 35 degrees internal rotation, 35 degrees external rotation, 15 degrees extension, 40 degrees adduction.   Stability:   Overall:  Shoulder, elbow and wrist stable   Strength and Tone:   Overall full shoulder muscles strength, full upper arm strength and normal upper arm bulk and tone.   The patient has been educated about the nature of the problem(s) and counseled on treatment options.  The patient appeared to understand what I have discussed and is in agreement with it.  Encounter Diagnosis  Name Primary?  . Chronic right shoulder pain Yes    PLAN Call if any problems.  Precautions discussed.  Continue current medications.   Return to clinic 1  month   I have reviewed the [place] web site prior to prescribing narcotic medicine for this patient.  Electronically Signed Sanjuana Kava, MD 6/21/201810:31 AM

## 2017-05-19 ENCOUNTER — Telehealth: Payer: Self-pay | Admitting: Orthopaedic Surgery

## 2017-05-19 NOTE — Telephone Encounter (Signed)
Hydrocodone-Acetaminophen  7.5/325 mg  Qty 20 Tablets

## 2017-05-20 MED ORDER — HYDROCODONE-ACETAMINOPHEN 7.5-325 MG PO TABS: 1.0000 | ORAL_TABLET | Freq: Four times a day (QID) | ORAL | 0 refills | Status: DC | PRN

## 2017-06-10 ENCOUNTER — Ambulatory Visit (INDEPENDENT_AMBULATORY_CARE_PROVIDER_SITE_OTHER): Payer: Medicaid Other | Admitting: Orthopaedic Surgery

## 2017-06-10 ENCOUNTER — Encounter: Payer: Self-pay | Admitting: Orthopaedic Surgery

## 2017-06-10 VITALS — BP 122/85 | HR 106 | Temp 97.5°F | Ht 70.0 in | Wt 220.0 lb

## 2017-06-10 DIAGNOSIS — G8929 Other chronic pain: Secondary | ICD-10-CM | POA: Diagnosis not present

## 2017-06-10 DIAGNOSIS — M25511 Pain in right shoulder: Secondary | ICD-10-CM | POA: Diagnosis not present

## 2017-06-10 MED ORDER — HYDROCODONE-ACETAMINOPHEN 7.5-325 MG PO TABS: 1.0000 | ORAL_TABLET | Freq: Four times a day (QID) | ORAL | 0 refills | Status: DC | PRN

## 2017-06-10 NOTE — Progress Notes (Signed)
Patient ME:QASTMHD Anne [name], female DOB:07-10-1981, 36 y.o. QQI:[id number]  Chief Complaint  Patient presents with  . Follow-up    chronic right shoulder pain    HPI  Anne [name] is a 36 y.o. female who has chronic right shoulder pain. She has no new trauma, no paresthesias.  She is doing her exercises. HPI  Body mass index is 31.57 kg/m.  ROS  Review of Systems  Constitutional:       Patient has Diabetes Mellitus. Patient has hypertension. Patient does not have COPD or shortness of breath. Patient does not have BMI > 35. Patient does not have current smoking history.  HENT: Negative for congestion.   Respiratory: Negative for cough and shortness of breath.   Cardiovascular: Negative for chest pain and leg swelling.  Gastrointestinal:       GERD  Endocrine: Positive for cold intolerance.  Musculoskeletal: Positive for arthralgias, myalgias and neck pain.  Allergic/Immunologic: Positive for environmental allergies.    Past Medical History:  Diagnosis Date  . Acid reflux   . Diabetes mellitus   . Diabetes mellitus type 1 ([place]) 10/31/2011  . Hypercholesteremia   . Hypertension   . Sciatica     Past Surgical History:  Procedure Laterality Date  . AMPUTATION  11/03/2011   Procedure: AMPUTATION RAY;  Surgeon: Jamesetta So;  Location: [facility];  Service: General;  Laterality: Right;  Right fourth and fifth metatarsal   . APPENDECTOMY    . CESAREAN SECTION  2004 and 2007   x2  . TUBAL LIGATION    . TUBAL LIGATION      Family History  Problem Relation Age of Onset  . Diabetes Father   . Anesthesia problems Neg Hx   . Hypotension Neg Hx   . Malignant hyperthermia Neg Hx   . Pseudochol deficiency Neg Hx     Social History Social History  Substance Use Topics  . Smoking status: Former Smoker    Packs/day: 0.25    Years: 20.00    Types: Cigarettes    Quit date: 12/29/2013  . Smokeless tobacco: Never Used  . Alcohol use No    No Known  Allergies  Current Outpatient Prescriptions  Medication Sig Dispense Refill  . Blood Glucose Monitoring Suppl (ACCU-CHEK AVIVA) device Use as instructed 1 each 0  . canagliflozin (INVOKANA) 100 MG TABS tablet Take 1 tablet (100 mg total) by mouth daily with breakfast. 30 tablet 3  . cholecalciferol (VITAMIN D) 400 UNITS TABS tablet Take 400 Units by mouth.    . doxycycline (VIBRAMYCIN) 100 MG capsule Take 100 mg by mouth 2 (two) times daily.    Marland Kitchen gemfibrozil (LOPID) 600 MG tablet TAKE (1) TABLET BY MOUTH TWICE A DAY BEFORE MEALS. (BREAKFAST AND SUPPER) 60 tablet 2  . HYDROcodone-acetaminophen (NORCO) 7.5-325 MG tablet Take 1 tablet by mouth every 6 (six) hours as needed for moderate pain. Take one tablet by mouth every six hours as needed for pain,  Seven day limit per Medicaid rules. 20 tablet 0  . LANTUS SOLOSTAR 100 UNIT/ML Solostar Pen INNJECT 50 UNITS S.Q. ONCE DAILY AT 10 P.M. 15 mL 2  . lisinopril (PRINIVIL,ZESTRIL) 20 MG tablet Take 20 mg by mouth daily.    . metFORMIN (GLUCOPHAGE) 500 MG tablet TAKE 1 TABLET BY MOUTH TWICE DAILY WITH MEALS. 60 tablet 2  . NOVOLOG FLEXPEN 100 UNIT/ML FlexPen INJECT 12-18 UNITS S.Q. THREE TIMES DAILY WITH MEALS. 15 mL 3  . Omega-3 Fatty Acids (FISH OIL) 1000  MG CAPS Take by mouth.    Marland Kitchen omeprazole (PRILOSEC) 20 MG capsule Take 20 mg by mouth daily.    . Potassium 99 MG TABS Take 1 tablet by mouth daily.    . sertraline (ZOLOFT) 25 MG tablet Take 50 mg by mouth daily.     Marland Kitchen sulfamethoxazole-trimethoprim (BACTRIM DS) 800-160 MG per tablet Take 1 tablet by mouth 2 (two) times daily. 20 tablet 0   No current facility-administered medications for this visit.      Physical Exam  Blood pressure 122/85, pulse (!) 106, temperature (!) 97.5 F (36.4 C), height 5\' 10"  (1.778 m), weight 220 lb (99.8 kg).  Constitutional: overall normal hygiene, normal nutrition, well developed, normal grooming, normal body habitus. Assistive device:CAM walker  right  Musculoskeletal: gait and station Limp right, muscle tone and strength are normal, no tremors or atrophy is present.  .  Neurological: coordination overall normal.  Deep tendon reflex/nerve stretch intact.  Sensation normal.  Cranial nerves II-XII intact.   Skin:   Normal overall no scars, lesions, ulcers or rashes. No psoriasis.  Psychiatric: Alert and oriented x 3.  Recent memory intact, remote memory unclear.  Normal mood and affect. Well groomed.  Good eye contact.  Cardiovascular: overall no swelling, no varicosities, no edema bilaterally, normal temperatures of the legs and arms, no clubbing, cyanosis and good capillary refill.  Lymphatic: palpation is normal.  Examination of right Upper Extremity is done.  Inspection:   Overall:  Elbow non-tender without crepitus or defects, forearm non-tender without crepitus or defects, wrist non-tender without crepitus or defects, hand non-tender.    Shoulder: with glenohumeral joint tenderness, without effusion.   Upper arm: without swelling and tenderness   Range of motion:   Overall:  Full range of motion of the elbow, full range of motion of wrist and full range of motion in fingers.   Shoulder:  right  165 degrees forward flexion; 150 degrees abduction; 35 degrees internal rotation, 35 degrees external rotation, 15 degrees extension, 40 degrees adduction.   Stability:   Overall:  Shoulder, elbow and wrist stable   Strength and Tone:   Overall full shoulder muscles strength, full upper arm strength and normal upper arm bulk and tone.   The patient has been educated about the nature of the problem(s) and counseled on treatment options.  The patient appeared to understand what I have discussed and is in agreement with it.  Encounter Diagnosis  Name Primary?  . Chronic right shoulder pain Yes    PLAN Call if any problems.  Precautions discussed.  Continue current medications.   Return to clinic 2 months   I have reviewed  the [place] web site prior to prescribing narcotic medicine for this patient.  Electronically Signed Sanjuana Kava, MD 7/19/201810:17 AM

## 2017-06-18 ENCOUNTER — Telehealth: Payer: Self-pay | Admitting: Orthopedic Surgery

## 2017-06-18 NOTE — Telephone Encounter (Signed)
Hydrocodone-Acetaminophen  7.5/325 mg  Qty 20 Tablets

## 2017-06-22 MED ORDER — HYDROCODONE-ACETAMINOPHEN 7.5-325 MG PO TABS: 1.0000 | ORAL_TABLET | Freq: Four times a day (QID) | ORAL | 0 refills | Status: DC | PRN

## 2017-07-01 ENCOUNTER — Telehealth: Payer: Self-pay | Admitting: Orthopaedic Surgery

## 2017-07-01 NOTE — Telephone Encounter (Signed)
Patient requests refill on Hydrocodone/Acetaminophen 7.5-325  Mgs.   Qty  20        Sig: Take 1 tablet by mouth every 6 (six) hours as needed for moderate pain. Take one tablet by mouth every six hours as needed for pain, Seven day limit per Medicaid rules.

## 2017-07-05 MED ORDER — HYDROCODONE-ACETAMINOPHEN 7.5-325 MG PO TABS: 1.0000 | ORAL_TABLET | Freq: Four times a day (QID) | ORAL | 0 refills | Status: DC | PRN

## 2017-07-20 ENCOUNTER — Telehealth: Payer: Self-pay | Admitting: Orthopaedic Surgery

## 2017-07-20 MED ORDER — HYDROCODONE-ACETAMINOPHEN 7.5-325 MG PO TABS: 1.0000 | ORAL_TABLET | Freq: Four times a day (QID) | ORAL | 0 refills | Status: DC | PRN

## 2017-07-20 NOTE — Telephone Encounter (Signed)
Patient requests refill:  HYDROcodone-acetaminophen (NORCO) 7.5-325 MG tablet 20 tablet

## 2017-08-04 ENCOUNTER — Telehealth: Payer: Self-pay | Admitting: Orthopaedic Surgery

## 2017-08-04 NOTE — Telephone Encounter (Signed)
Patient requests refill:  HYDROcodone-acetaminophen (NORCO) 7.5-325 MG tablet 15 tablet

## 2017-08-05 MED ORDER — HYDROCODONE-ACETAMINOPHEN 7.5-325 MG PO TABS: 1.0000 | ORAL_TABLET | Freq: Four times a day (QID) | ORAL | 0 refills | Status: DC | PRN

## 2017-08-10 ENCOUNTER — Ambulatory Visit: Payer: Medicaid Other | Admitting: Orthopaedic Surgery

## 2017-08-12 ENCOUNTER — Telehealth: Payer: Self-pay | Admitting: Orthopaedic Surgery

## 2017-08-12 MED ORDER — HYDROCODONE-ACETAMINOPHEN 7.5-325 MG PO TABS: 1.0000 | ORAL_TABLET | Freq: Four times a day (QID) | ORAL | 0 refills | Status: DC | PRN

## 2017-08-12 NOTE — Telephone Encounter (Signed)
Patient requests refill on Hydrocodone/Acetaminophen  7.5-325  Mgs.   Qty  15       Sig: Take 1 tablet by mouth every 6 (six) hours as needed for moderate pain. Take one tablet by mouth every six hours as needed for pain, Seven day limit per Medicaid rules.

## 2017-08-17 ENCOUNTER — Encounter: Payer: Self-pay | Admitting: Orthopaedic Surgery

## 2017-08-17 ENCOUNTER — Ambulatory Visit (INDEPENDENT_AMBULATORY_CARE_PROVIDER_SITE_OTHER): Payer: Medicaid Other

## 2017-08-17 ENCOUNTER — Ambulatory Visit (INDEPENDENT_AMBULATORY_CARE_PROVIDER_SITE_OTHER): Payer: Medicaid Other | Admitting: Orthopaedic Surgery

## 2017-08-17 VITALS — BP 153/93 | HR 77 | Temp 96.9°F | Ht 70.0 in | Wt 215.0 lb

## 2017-08-17 DIAGNOSIS — M25511 Pain in right shoulder: Secondary | ICD-10-CM

## 2017-08-17 DIAGNOSIS — G8929 Other chronic pain: Secondary | ICD-10-CM | POA: Diagnosis not present

## 2017-08-17 MED ORDER — HYDROCODONE-ACETAMINOPHEN 7.5-325 MG PO TABS: 1.0000 | ORAL_TABLET | Freq: Four times a day (QID) | ORAL | 0 refills | Status: DC | PRN

## 2017-08-17 NOTE — Progress Notes (Addendum)
Patient WU:JWJXBJY [name], female DOB:15-May-1981, 36 y.o. NWG:[id number]  Chief Complaint  Patient presents with  . Follow-up    Right shoulder    HPI  Anne [name] is a 36 y.o. female who has chronic pain of the right shoulder. She has more pain now and it hurts most of the time. She has no paresthesias and no trauma.  She is taking her medicine. She cannot take NSAIDs. HPI  Body mass index is 30.85 kg/m.  ROS  Review of Systems  Constitutional:       Patient has Diabetes Mellitus. Patient has hypertension. Patient does not have COPD or shortness of breath. Patient does not have BMI > 35. Patient does not have current smoking history.  HENT: Negative for congestion.   Respiratory: Negative for cough and shortness of breath.   Cardiovascular: Negative for chest pain and leg swelling.  Gastrointestinal:       GERD  Endocrine: Positive for cold intolerance.  Musculoskeletal: Positive for arthralgias, myalgias and neck pain.  Allergic/Immunologic: Positive for environmental allergies.    Past Medical History:  Diagnosis Date  . Acid reflux   . Diabetes mellitus   . Diabetes mellitus type 1 ([place]) 10/31/2011  . Hypercholesteremia   . Hypertension   . Sciatica     Past Surgical History:  Procedure Laterality Date  . AMPUTATION  11/03/2011   Procedure: AMPUTATION RAY;  Surgeon: Jamesetta So;  Location: [facility];  Service: General;  Laterality: Right;  Right fourth and fifth metatarsal   . APPENDECTOMY    . CESAREAN SECTION  2004 and 2007   x2  . TUBAL LIGATION    . TUBAL LIGATION      Family History  Problem Relation Age of Onset  . Diabetes Father   . Anesthesia problems Neg Hx   . Hypotension Neg Hx   . Malignant hyperthermia Neg Hx   . Pseudochol deficiency Neg Hx     Social History Social History  Substance Use Topics  . Smoking status: Former Smoker    Packs/day: 0.25    Years: 20.00    Types: Cigarettes    Quit date: 12/29/2013  . Smokeless  tobacco: Never Used  . Alcohol use No    No Known Allergies  Current Outpatient Prescriptions  Medication Sig Dispense Refill  . Blood Glucose Monitoring Suppl (ACCU-CHEK AVIVA) device Use as instructed 1 each 0  . canagliflozin (INVOKANA) 100 MG TABS tablet Take 1 tablet (100 mg total) by mouth daily with breakfast. 30 tablet 3  . cholecalciferol (VITAMIN D) 400 UNITS TABS tablet Take 400 Units by mouth.    . doxycycline (VIBRAMYCIN) 100 MG capsule Take 100 mg by mouth 2 (two) times daily.    Marland Kitchen gemfibrozil (LOPID) 600 MG tablet TAKE (1) TABLET BY MOUTH TWICE A DAY BEFORE MEALS. (BREAKFAST AND SUPPER) 60 tablet 2  . HYDROcodone-acetaminophen (NORCO) 7.5-325 MG tablet Take 1 tablet by mouth every 6 (six) hours as needed for moderate pain. Take one tablet by mouth every six hours as needed for pain,  Seven day limit per Medicaid rules. 15 tablet 0  . LANTUS SOLOSTAR 100 UNIT/ML Solostar Pen INNJECT 50 UNITS S.Q. ONCE DAILY AT 10 P.M. 15 mL 2  . lisinopril (PRINIVIL,ZESTRIL) 20 MG tablet Take 20 mg by mouth daily.    . metFORMIN (GLUCOPHAGE) 500 MG tablet TAKE 1 TABLET BY MOUTH TWICE DAILY WITH MEALS. 60 tablet 2  . NOVOLOG FLEXPEN 100 UNIT/ML FlexPen INJECT 12-18 UNITS S.Q.  THREE TIMES DAILY WITH MEALS. 15 mL 3  . Omega-3 Fatty Acids (FISH OIL) 1000 MG CAPS Take by mouth.    Marland Kitchen omeprazole (PRILOSEC) 20 MG capsule Take 20 mg by mouth daily.    . Potassium 99 MG TABS Take 1 tablet by mouth daily.    . sertraline (ZOLOFT) 25 MG tablet Take 50 mg by mouth daily.     Marland Kitchen sulfamethoxazole-trimethoprim (BACTRIM DS) 800-160 MG per tablet Take 1 tablet by mouth 2 (two) times daily. 20 tablet 0   No current facility-administered medications for this visit.      Physical Exam  Blood pressure (!) 153/93, pulse 77, temperature (!) 96.9 F (36.1 C), height 5\' 10"  (1.778 m), weight 215 lb (97.5 kg).  Constitutional: overall normal hygiene, normal nutrition, well developed, normal grooming, normal body  habitus. Assistive device:none  Musculoskeletal: gait and station Limp none, muscle tone and strength are normal, no tremors or atrophy is present.  .  Neurological: coordination overall normal.  Deep tendon reflex/nerve stretch intact.  Sensation normal.  Cranial nerves II-XII intact.   Skin:   Normal overall no scars, lesions, ulcers or rashes. No psoriasis.  Psychiatric: Alert and oriented x 3.  Recent memory intact, remote memory unclear.  Normal mood and affect. Well groomed.  Good eye contact.  Cardiovascular: overall no swelling, no varicosities, no edema bilaterally, normal temperatures of the legs and arms, no clubbing, cyanosis and good capillary refill.  Lymphatic: palpation is normal.  All other systems reviewed and are negative   Right shoulder has painful ROM with near full motion today but pains in the extremes, more overhead.  NV intact.  Grips normal.  The patient has been educated about the nature of the problem(s) and counseled on treatment options.  The patient appeared to understand what I have discussed and is in agreement with it.  Encounter Diagnosis  Name Primary?  . Chronic right shoulder pain Yes   X-rays were done of the right shoulder reported separately. PLAN Call if any problems.  Precautions discussed.  Continue current medications.   Return to clinic after MRI of the shoulder on the right   I have reviewed the [place] web site prior to prescribing narcotic medicine for this patient.  Electronically Signed Sanjuana Kava, MD 9/25/20182:55 PM

## 2017-08-24 ENCOUNTER — Ambulatory Visit (HOSPITAL_COMMUNITY)
Admission: RE | Admit: 2017-08-24 | Discharge: 2017-08-24 | Disposition: A | Discharge status: Home / Self Care | Payer: Medicaid Other | Source: Ambulatory Visit | Attending: Orthopaedic Surgery | Admitting: Orthopaedic Surgery

## 2017-08-24 DIAGNOSIS — M25511 Pain in right shoulder: Secondary | ICD-10-CM | POA: Insufficient documentation

## 2017-08-24 DIAGNOSIS — M65811 Other synovitis and tenosynovitis, right shoulder: Secondary | ICD-10-CM | POA: Diagnosis not present

## 2017-08-24 DIAGNOSIS — M12811 Other specific arthropathies, not elsewhere classified, right shoulder: Secondary | ICD-10-CM | POA: Diagnosis not present

## 2017-08-24 DIAGNOSIS — G8929 Other chronic pain: Secondary | ICD-10-CM | POA: Diagnosis present

## 2017-08-26 ENCOUNTER — Ambulatory Visit (INDEPENDENT_AMBULATORY_CARE_PROVIDER_SITE_OTHER): Payer: Medicaid Other | Admitting: Orthopaedic Surgery

## 2017-08-26 ENCOUNTER — Encounter: Payer: Self-pay | Admitting: Orthopaedic Surgery

## 2017-08-26 VITALS — BP 150/104 | HR 86 | Temp 97.1°F | Ht 70.0 in | Wt 215.0 lb

## 2017-08-26 DIAGNOSIS — M25511 Pain in right shoulder: Secondary | ICD-10-CM

## 2017-08-26 DIAGNOSIS — G8929 Other chronic pain: Secondary | ICD-10-CM

## 2017-08-26 MED ORDER — HYDROCODONE-ACETAMINOPHEN 7.5-325 MG PO TABS: 1.0000 | ORAL_TABLET | Freq: Four times a day (QID) | ORAL | 0 refills | Status: DC | PRN

## 2017-08-26 NOTE — Progress Notes (Signed)
Patient NA:Anne [name], female DOB:14-Dec-1980, 36 y.o. GUR:[id number]  Chief Complaint  Patient presents with  . Results    MRI Right Shoulder    HPI  Anne [name] is a 36 y.o. female who has chronic pain of the right shoulder.  She had MRI which showed: IMPRESSION: 1. Mild tendinosis of the supraspinatus and infraspinatus tendons. 2. Moderate arthropathy of the acromioclavicular joint.  I have explained the findings to her.   HPI  Body mass index is 30.85 kg/m.  ROS  Review of Systems  Constitutional:       Patient has Diabetes Mellitus. Patient has hypertension. Patient does not have COPD or shortness of breath. Patient does not have BMI > 35. Patient does not have current smoking history.  HENT: Negative for congestion.   Respiratory: Negative for cough and shortness of breath.   Cardiovascular: Negative for chest pain and leg swelling.  Gastrointestinal:       GERD  Endocrine: Positive for cold intolerance.  Musculoskeletal: Positive for arthralgias, myalgias and neck pain.  Allergic/Immunologic: Positive for environmental allergies.    Past Medical History:  Diagnosis Date  . Acid reflux   . Diabetes mellitus   . Diabetes mellitus type 1 ([place]) 10/31/2011  . Hypercholesteremia   . Hypertension   . Sciatica     Past Surgical History:  Procedure Laterality Date  . AMPUTATION  11/03/2011   Procedure: AMPUTATION RAY;  Surgeon: Jamesetta So;  Location: [facility];  Service: General;  Laterality: Right;  Right fourth and fifth metatarsal   . APPENDECTOMY    . CESAREAN SECTION  2004 and 2007   x2  . TUBAL LIGATION    . TUBAL LIGATION      Family History  Problem Relation Age of Onset  . Diabetes Father   . Anesthesia problems Neg Hx   . Hypotension Neg Hx   . Malignant hyperthermia Neg Hx   . Pseudochol deficiency Neg Hx     Social History Social History  Substance Use Topics  . Smoking status: Former Smoker    Packs/day: 0.25    Years:  20.00    Types: Cigarettes    Quit date: 12/29/2013  . Smokeless tobacco: Never Used  . Alcohol use No    No Known Allergies  Current Outpatient Prescriptions  Medication Sig Dispense Refill  . Blood Glucose Monitoring Suppl (ACCU-CHEK AVIVA) device Use as instructed 1 each 0  . canagliflozin (INVOKANA) 100 MG TABS tablet Take 1 tablet (100 mg total) by mouth daily with breakfast. 30 tablet 3  . cholecalciferol (VITAMIN D) 400 UNITS TABS tablet Take 400 Units by mouth.    . doxycycline (VIBRAMYCIN) 100 MG capsule Take 100 mg by mouth 2 (two) times daily.    Marland Kitchen gemfibrozil (LOPID) 600 MG tablet TAKE (1) TABLET BY MOUTH TWICE A DAY BEFORE MEALS. (BREAKFAST AND SUPPER) 60 tablet 2  . HYDROcodone-acetaminophen (NORCO) 7.5-325 MG tablet Take 1 tablet by mouth every 6 (six) hours as needed for moderate pain. Take one tablet by mouth every six hours as needed for pain,  Seven day limit per Medicaid rules. 15 tablet 0  . LANTUS SOLOSTAR 100 UNIT/ML Solostar Pen INNJECT 50 UNITS S.Q. ONCE DAILY AT 10 P.M. 15 mL 2  . lisinopril (PRINIVIL,ZESTRIL) 20 MG tablet Take 20 mg by mouth daily.    . metFORMIN (GLUCOPHAGE) 500 MG tablet TAKE 1 TABLET BY MOUTH TWICE DAILY WITH MEALS. 60 tablet 2  . NOVOLOG FLEXPEN 100 UNIT/ML  FlexPen INJECT 12-18 UNITS S.Q. THREE TIMES DAILY WITH MEALS. 15 mL 3  . Omega-3 Fatty Acids (FISH OIL) 1000 MG CAPS Take by mouth.    Marland Kitchen omeprazole (PRILOSEC) 20 MG capsule Take 20 mg by mouth daily.    . Potassium 99 MG TABS Take 1 tablet by mouth daily.    . sertraline (ZOLOFT) 25 MG tablet Take 50 mg by mouth daily.     Marland Kitchen sulfamethoxazole-trimethoprim (BACTRIM DS) 800-160 MG per tablet Take 1 tablet by mouth 2 (two) times daily. 20 tablet 0   No current facility-administered medications for this visit.      Physical Exam  Blood pressure (!) 150/104, pulse 86, temperature (!) 97.1 F (36.2 C), height 5\' 10"  (1.778 m), weight 215 lb (97.5 kg).  Constitutional: overall normal  hygiene, normal nutrition, well developed, normal grooming, normal body habitus. Assistive device:none  Musculoskeletal: gait and station Limp none, muscle tone and strength are normal, no tremors or atrophy is present.  .  Neurological: coordination overall normal.  Deep tendon reflex/nerve stretch intact.  Sensation normal.  Cranial nerves II-XII intact.   Skin:   Normal overall no scars, lesions, ulcers or rashes. No psoriasis.  Psychiatric: Alert and oriented x 3.  Recent memory intact, remote memory unclear.  Normal mood and affect. Well groomed.  Good eye contact.  Cardiovascular: overall no swelling, no varicosities, no edema bilaterally, normal temperatures of the legs and arms, no clubbing, cyanosis and good capillary refill.  Lymphatic: palpation is normal.  All other systems reviewed and are negative   Right shoulder has full but painful ROM of the shoulder.  NV intact.  The patient has been educated about the nature of the problem(s) and counseled on treatment options.  The patient appeared to understand what I have discussed and is in agreement with it.  Encounter Diagnosis  Name Primary?  . Chronic right shoulder pain Yes    PLAN Call if any problems.  Precautions discussed.  Continue current medications.   Return to clinic 6 weeks   I have reviewed the [place] web site prior to prescribing narcotic medicine for this patient.  Electronically Signed Sanjuana Kava, MD 10/4/201810:51 AM

## 2017-09-09 ENCOUNTER — Telehealth: Payer: Self-pay | Admitting: Orthopaedic Surgery

## 2017-09-09 NOTE — Telephone Encounter (Signed)
Patient requests refill on Hydrocodone/Acetaminophen 7.5-325 mgs.   Qty  15  Sig: Take 1 tablet by mouth every 6 (six) hours as needed for moderate pain. Take one tablet by mouth every six hours as needed for pain, Seven day limit per Medicaid rules.

## 2017-09-13 MED ORDER — HYDROCODONE-ACETAMINOPHEN 7.5-325 MG PO TABS: 1.0000 | ORAL_TABLET | Freq: Four times a day (QID) | ORAL | 0 refills | Status: DC | PRN

## 2017-09-23 ENCOUNTER — Telehealth: Payer: Self-pay | Admitting: Orthopaedic Surgery

## 2017-09-23 NOTE — Telephone Encounter (Signed)
Refill request for Hydrocodone/Acetaminophen 7.5-325  Mgs.   Qty  15   Sig: Take 1 tablet by mouth every 6 (six) hours as needed for moderate pain. Take one tablet by mouth every six hours as needed for pain, Seven day limit per Medicaid rules

## 2017-09-23 NOTE — Telephone Encounter (Signed)
No more narcotics.  Take Advil, Aleve or Tylenol. 

## 2017-10-07 ENCOUNTER — Ambulatory Visit: Payer: Medicaid Other | Admitting: Orthopaedic Surgery

## 2017-10-20 ENCOUNTER — Encounter: Payer: Self-pay | Admitting: Orthopaedic Surgery

## 2017-10-20 ENCOUNTER — Ambulatory Visit: Payer: Medicaid Other | Admitting: Orthopaedic Surgery

## 2017-10-20 VITALS — BP 136/87 | HR 96 | Temp 97.7°F | Ht 70.0 in | Wt 211.0 lb

## 2017-10-20 DIAGNOSIS — G8929 Other chronic pain: Secondary | ICD-10-CM

## 2017-10-20 DIAGNOSIS — M25511 Pain in right shoulder: Secondary | ICD-10-CM

## 2017-10-20 MED ORDER — HYDROCODONE-ACETAMINOPHEN 7.5-325 MG PO TABS: 1.0000 | ORAL_TABLET | Freq: Four times a day (QID) | ORAL | 0 refills | Status: DC | PRN

## 2017-10-20 NOTE — Progress Notes (Signed)
Patient [facility]:Anne [name], female DOB:08/04/1981, 36 y.o. WCB:[id number]  Chief Complaint  Patient presents with  . Shoulder Pain    right    HPI  Anne [name] is a 36 y.o. female who has chronic right shoulder pain made worse with the cold weather.  She has no new trauma, no numbness.  She has no redness. HPI  Body mass index is 30.28 kg/m.  ROS  Review of Systems  Constitutional:       Patient has Diabetes Mellitus. Patient has hypertension. Patient does not have COPD or shortness of breath. Patient does not have BMI > 35. Patient does not have current smoking history.  HENT: Negative for congestion.   Respiratory: Negative for cough and shortness of breath.   Cardiovascular: Negative for chest pain and leg swelling.  Gastrointestinal:       GERD  Endocrine: Positive for cold intolerance.  Musculoskeletal: Positive for arthralgias, myalgias and neck pain.  Allergic/Immunologic: Positive for environmental allergies.  All other systems reviewed and are negative.   Past Medical History:  Diagnosis Date  . Acid reflux   . Diabetes mellitus   . Diabetes mellitus type 1 ([place]) 10/31/2011  . Hypercholesteremia   . Hypertension   . Sciatica     Past Surgical History:  Procedure Laterality Date  . AMPUTATION  11/03/2011   Procedure: AMPUTATION RAY;  Surgeon: Jamesetta So;  Location: [facility];  Service: General;  Laterality: Right;  Right fourth and fifth metatarsal   . APPENDECTOMY    . CESAREAN SECTION  2004 and 2007   x2  . TUBAL LIGATION    . TUBAL LIGATION      Family History  Problem Relation Age of Onset  . Diabetes Father   . Anesthesia problems Neg Hx   . Hypotension Neg Hx   . Malignant hyperthermia Neg Hx   . Pseudochol deficiency Neg Hx     Social History Social History   Tobacco Use  . Smoking status: Former Smoker    Packs/day: 0.25    Years: 20.00    Pack years: 5.00    Types: Cigarettes    Last attempt to quit: 12/29/2013    Years  since quitting: 3.8  . Smokeless tobacco: Never Used  Substance Use Topics  . Alcohol use: No  . Drug use: No    No Known Allergies  Current Outpatient Medications  Medication Sig Dispense Refill  . Blood Glucose Monitoring Suppl (ACCU-CHEK AVIVA) device Use as instructed 1 each 0  . canagliflozin (INVOKANA) 100 MG TABS tablet Take 1 tablet (100 mg total) by mouth daily with breakfast. 30 tablet 3  . cholecalciferol (VITAMIN D) 400 UNITS TABS tablet Take 400 Units by mouth.    . doxycycline (VIBRAMYCIN) 100 MG capsule Take 100 mg by mouth 2 (two) times daily.    Marland Kitchen gemfibrozil (LOPID) 600 MG tablet TAKE (1) TABLET BY MOUTH TWICE A DAY BEFORE MEALS. (BREAKFAST AND SUPPER) 60 tablet 2  . HYDROcodone-acetaminophen (NORCO) 7.5-325 MG tablet Take 1 tablet by mouth every 6 (six) hours as needed for moderate pain. Take one tablet by mouth every six hours as needed for pain,  Seven day limit per Medicaid rules. 28 tablet 0  . LANTUS SOLOSTAR 100 UNIT/ML Solostar Pen INNJECT 50 UNITS S.Q. ONCE DAILY AT 10 P.M. 15 mL 2  . lisinopril (PRINIVIL,ZESTRIL) 20 MG tablet Take 20 mg by mouth daily.    . metFORMIN (GLUCOPHAGE) 500 MG tablet TAKE 1 TABLET BY  MOUTH TWICE DAILY WITH MEALS. 60 tablet 2  . NOVOLOG FLEXPEN 100 UNIT/ML FlexPen INJECT 12-18 UNITS S.Q. THREE TIMES DAILY WITH MEALS. 15 mL 3  . Omega-3 Fatty Acids (FISH OIL) 1000 MG CAPS Take by mouth.    Marland Kitchen omeprazole (PRILOSEC) 20 MG capsule Take 20 mg by mouth daily.    . Potassium 99 MG TABS Take 1 tablet by mouth daily.    . sertraline (ZOLOFT) 25 MG tablet Take 50 mg by mouth daily.     Marland Kitchen sulfamethoxazole-trimethoprim (BACTRIM DS) 800-160 MG per tablet Take 1 tablet by mouth 2 (two) times daily. 20 tablet 0   No current facility-administered medications for this visit.      Physical Exam  Blood pressure 136/87, pulse 96, temperature 97.7 F (36.5 C), height 5\' 10"  (1.778 m), weight 211 lb (95.7 kg).  Constitutional: overall normal hygiene,  normal nutrition, well developed, normal grooming, normal body habitus. Assistive device:none  Musculoskeletal: gait and station Limp none, muscle tone and strength are normal, no tremors or atrophy is present.  .  Neurological: coordination overall normal.  Deep tendon reflex/nerve stretch intact.  Sensation normal.  Cranial nerves II-XII intact.   Skin:   Normal overall no scars, lesions, ulcers or rashes. No psoriasis.  Psychiatric: Alert and oriented x 3.  Recent memory intact, remote memory unclear.  Normal mood and affect. Well groomed.  Good eye contact.  Cardiovascular: overall no swelling, no varicosities, no edema bilaterally, normal temperatures of the legs and arms, no clubbing, cyanosis and good capillary refill.  Lymphatic: palpation is normal.  All other systems reviewed and are negative   Examination of right Upper Extremity is done.  Inspection:   Overall:  Elbow non-tender without crepitus or defects, forearm non-tender without crepitus or defects, wrist non-tender without crepitus or defects, hand non-tender.    Shoulder: with glenohumeral joint tenderness, without effusion.   Upper arm: without swelling and tenderness   Range of motion:   Overall:  Full range of motion of the elbow, full range of motion of wrist and full range of motion in fingers.   Shoulder:  right  180 degrees forward flexion; 165 degrees abduction; 40 degrees internal rotation, 40 degrees external rotation, 15 degrees extension, 40 degrees adduction.   Stability:   Overall:  Shoulder, elbow and wrist stable   Strength and Tone:   Overall full shoulder muscles strength, full upper arm strength and normal upper arm bulk and tone.  The patient has been educated about the nature of the problem(s) and counseled on treatment options.  The patient appeared to understand what I have discussed and is in agreement with it.  Encounter Diagnosis  Name Primary?  . Chronic right shoulder pain Yes     PLAN Call if any problems.  Precautions discussed.  Continue current medications.   Return to clinic 3 months   I have reviewed the [place] web site prior to prescribing narcotic medicine for this patient.  Electronically [place], MD 11/28/20182:46 PM

## 2017-11-08 ENCOUNTER — Telehealth: Payer: Self-pay | Admitting: Orthopaedic Surgery

## 2017-11-08 MED ORDER — HYDROCODONE-ACETAMINOPHEN 7.5-325 MG PO TABS: 1.0000 | ORAL_TABLET | Freq: Four times a day (QID) | ORAL | 0 refills | Status: DC | PRN

## 2017-11-08 NOTE — Telephone Encounter (Signed)
Hydrocodone-Acetaminophen 7.5/325 mg  Qty  28 Tablets    Patient states she uses [facility]

## 2017-11-18 ENCOUNTER — Telehealth: Payer: Self-pay | Admitting: Orthopaedic Surgery

## 2017-11-18 MED ORDER — HYDROCODONE-ACETAMINOPHEN 7.5-325 MG PO TABS: 1.0000 | ORAL_TABLET | Freq: Four times a day (QID) | ORAL | 0 refills | Status: DC | PRN

## 2017-11-18 NOTE — Telephone Encounter (Signed)
°  Refill request for Hydrocodone/Acetaminophen 7.5-325  mgs.  Qty 20   Sig: Take 1 tablet by mouth every 6 (six) hours as needed for moderate pain. Take one tablet by mouth every six hours as needed for pain, Seven day limit per Medicaid rules.  Patient uses [facility]

## 2017-11-23 DIAGNOSIS — I219 Acute myocardial infarction, unspecified: Secondary | ICD-10-CM

## 2017-11-23 HISTORY — DX: Acute myocardial infarction, unspecified: I21.9

## 2017-12-02 ENCOUNTER — Telehealth: Payer: Self-pay | Admitting: Orthopaedic Surgery

## 2017-12-02 MED ORDER — HYDROCODONE-ACETAMINOPHEN 7.5-325 MG PO TABS: 1.0000 | ORAL_TABLET | Freq: Four times a day (QID) | ORAL | 0 refills | Status: DC | PRN

## 2017-12-02 MED ORDER — HYDROCODONE-ACETAMINOPHEN 7.5-325 MG PO TABS: ORAL_TABLET | ORAL | 0 refills | Status: DC

## 2017-12-02 NOTE — Telephone Encounter (Signed)
Patient requests refill on Hydrocodone/Acetaminophen (Norco)  7.5-325  Mgs.   Qty  18       Sig: Take 1 tablet by mouth every 6 (six) hours as needed for moderate pain. Take one tablet by mouth every six hours as needed for pain, Seven day limit per Medicaid rules.   Patient uses [facility]

## 2017-12-03 NOTE — Progress Notes (Signed)
New patient paperwork 

## 2017-12-06 ENCOUNTER — Encounter: Payer: Self-pay | Admitting: Gastroenterology

## 2017-12-06 ENCOUNTER — Ambulatory Visit (INDEPENDENT_AMBULATORY_CARE_PROVIDER_SITE_OTHER): Payer: Medicaid Other | Admitting: Gastroenterology

## 2017-12-06 VITALS — BP 162/94 | HR 92 | Ht 69.25 in | Wt 204.1 lb

## 2017-12-06 DIAGNOSIS — R6881 Early satiety: Secondary | ICD-10-CM | POA: Diagnosis not present

## 2017-12-06 DIAGNOSIS — R634 Abnormal weight loss: Secondary | ICD-10-CM

## 2017-12-06 DIAGNOSIS — K5900 Constipation, unspecified: Secondary | ICD-10-CM | POA: Diagnosis not present

## 2017-12-06 DIAGNOSIS — R1013 Epigastric pain: Secondary | ICD-10-CM

## 2017-12-06 DIAGNOSIS — R112 Nausea with vomiting, unspecified: Secondary | ICD-10-CM

## 2017-12-06 NOTE — Progress Notes (Signed)
HPI :  37 y/o female with a history of poorly controlled DM, HTN, HLD, referred here by Neysa Hotter PA for nausea / vomiting, abdominal pains, belching, weight loss, constipation.  The patient states for the past year she has developed several gastrointestinal complaints. She endorses nausea and vomiting after she eats at times. She has foul-smelling belches, and feels like her food does not digest and just sits in her stomach. She has early satiety frequently after eating. She often has nausea after eating and sometimes vomits undigested food. She also endorses worsening of heartburn or reflux symptoms despite taking omeprazole 20 mg once per day. She thinks she has lost about 25 pounds since October due to the symptoms. She feels discomfort in her epigastric area along with this, usually after she eats. She does endorse increased gas and abdominal distention along with the symptoms. She otherwise states she has had constipation which is bothering her recently. She denies any blood in the stools  She has had A1c > 10 (range 10-12s), on insulin. She takes Norco - 1-2 tabs per day for chronic shoulder pain.  She was given Reglan to take 10mg  every 6 hours, she has not tried much of this yet, just recent prescribed to her.   She's never had an EGD or a prior gastric emptying study. Denies any family history of colon cancer or gastric cancer. She does endorse a history of depression and previously had episodes of binge eating. He has not been able to do that due to her symptoms recently.  Past Medical History:  Diagnosis Date  . Acid reflux   . Anxiety   . Arthritis   . Diabetes mellitus type 1 ([place]) 10/31/2011  . Hypercholesteremia   . Hypertension   . Renal failure   . Sciatica      Past Surgical History:  Procedure Laterality Date  . AMPUTATION Right 11/03/2011   Procedure: AMPUTATION RAY;  Surgeon: Jamesetta So;  Location: [facility];  Service: General;  Laterality: Right;  Right  fourth and fifth metatarsal   . APPENDECTOMY    . CESAREAN SECTION  2004 and 2007   x2  . TUBAL LIGATION     Family History  Problem Relation Age of Onset  . Diabetes Father   . Lung cancer Father   . Alcoholism Father   . Asthma Mother   . Kidney disease Mother   . Anemia Mother        hemolytic  . Hypertension Mother   . Heart attack Paternal Grandmother   . Diabetes Paternal Grandmother   . Diabetes Paternal Grandfather   . Anesthesia problems Neg Hx   . Hypotension Neg Hx   . Malignant hyperthermia Neg Hx   . Pseudochol deficiency Neg Hx    Social History   Tobacco Use  . Smoking status: Former Smoker    Packs/day: 0.25    Years: 20.00    Pack years: 5.00    Types: Cigarettes    Last attempt to quit: 12/29/2013    Years since quitting: 3.9  . Smokeless tobacco: Never Used  Substance Use Topics  . Alcohol use: No  . Drug use: No   Current Outpatient Medications  Medication Sig Dispense Refill  . Blood Glucose Monitoring Suppl (ACCU-CHEK AVIVA) device Use as instructed 1 each 0  . cholecalciferol (VITAMIN D) 400 UNITS TABS tablet Take 400 Units by mouth.    Marland Kitchen gemfibrozil (LOPID) 600 MG tablet TAKE (1) TABLET BY MOUTH TWICE  A DAY BEFORE MEALS. (BREAKFAST AND SUPPER) 60 tablet 2  . HYDROcodone-acetaminophen (NORCO) 7.5-325 MG tablet Take one tablet by mouth every six hours as needed for pain,  Seven day limit per Medicaid rules. 15 tablet 0  . LANTUS SOLOSTAR 100 UNIT/ML Solostar Pen INNJECT 50 UNITS S.Q. ONCE DAILY AT 10 P.M. (Patient taking differently: INNJECT 70 UNITS S.Q. ONCE DAILY AT 10 P.M.) 15 mL 2  . lisinopril (PRINIVIL,ZESTRIL) 20 MG tablet Take 20 mg by mouth daily.    . metFORMIN (GLUCOPHAGE) 500 MG tablet TAKE 1 TABLET BY MOUTH TWICE DAILY WITH MEALS. (Patient taking differently: TAKE 1 TABLET BY MOUTH FOUR TIMES A DAY) 60 tablet 2  . metoCLOPramide (REGLAN) 10 MG/10ML SOLN Take 10 mg by mouth. 3 TO 4 TIMES DAILY for possible Gastroparesis    . NOVOLOG  FLEXPEN 100 UNIT/ML FlexPen INJECT 12-18 UNITS S.Q. THREE TIMES DAILY WITH MEALS. 15 mL 3  . Omega-3 Fatty Acids (FISH OIL) 1000 MG CAPS Take by mouth.    Marland Kitchen omeprazole (PRILOSEC) 20 MG capsule Take 20 mg by mouth daily.    . Potassium 99 MG TABS Take 1 tablet by mouth daily.    . sertraline (ZOLOFT) 100 MG tablet Take 100 mg by mouth daily.    Marland Kitchen sulfamethoxazole-trimethoprim (BACTRIM DS) 800-160 MG per tablet Take 1 tablet by mouth 2 (two) times daily. 20 tablet 0   No current facility-administered medications for this visit.    No Known Allergies   Review of Systems: All systems reviewed and negative except where noted in HPI.   No recent labs on file  Lab Results  Component Value Date   HGBA1C 11.0 (H) 11/04/2015     Physical Exam: BP (!) 162/94 (BP Location: Left Arm, Patient Position: Sitting, Cuff Size: Normal)   Pulse 92   Ht 5' 9.25" (1.759 m) Comment: height measured without shoes  Wt 204 lb 2 oz (92.6 kg)   BMI 29.93 kg/m  Constitutional: Pleasant,well-developed, female in no acute distress. HEENT: Normocephalic and atraumatic. Conjunctivae are normal. No scleral icterus. Neck supple.  Cardiovascular: Normal rate, regular rhythm.  Pulmonary/chest: Effort normal and breath sounds normal. No wheezing, rales or rhonchi. Abdominal: Soft, protuberant, nontender.  There are no masses palpable. No hepatomegaly. Extremities: no edema Lymphadenopathy: No cervical adenopathy noted. Neurological: Alert and oriented to person place and time. Skin: Skin is warm and dry. No rashes noted. Psychiatric: Normal mood and affect. Behavior is normal.   ASSESSMENT AND PLAN: 37 year old female with poorly controlled type 1 diabetes, presenting with constellation of symptoms to include postprandial nausea and vomiting, epigastric pain, early satiety, constipation.  I suspect she very likely has gastroparesis in the setting of poorly controlled diabetes and narcotic use. We discussed  what this is for a bit and management options. I'm recommending an upper endoscopy initially, to exclude any evidence of mechanical obstruction. If this is normal then we will proceed with gastric emptying study to confirm the diagnosis. I discussed risks and benefits of endoscopy and anesthesia with her and she wanted to proceed. In the interim we discussed recommendations regarding medical therapy as outlined below for this issue and her constipation.   Recommend: - EGD as above. If normal, will recommend gastric emptying study off Reglan and narcotics to confirm diagnosis - minimize / stop narcotic use - trial of increasing omeprazole to 20mg  BID to treat reflux - discussed Reglan at length, to include risks of tardive dyskinesia. She will try it, but start at  low dose 5mg  TID with meals - recommended gastroparesis diet, provided handout on this. If workup reveals gastroparesis diagnosis, she may opt to try domperidone. - trial of Miralax OTC for constipation - improve glycemic control, she will work on this with primary care  Patient agreed with plan as outlined, further recommendations pending the results.   [place] Cellar, MD [facility] Pager [phone]  CC: Antionette Fairy, PA-C

## 2017-12-06 NOTE — Patient Instructions (Addendum)
If you are age 38 or older, your body mass index should be between 23-30. Your Body mass index is 29.93 kg/m. If this is out of the aforementioned range listed, please consider follow up with your Primary Care Provider.  If you are age 38 or younger, your body mass index should be between 19-25. Your Body mass index is 29.93 kg/m. If this is out of the aformentioned range listed, please consider follow up with your Primary Care Provider.   You have been scheduled for an endoscopy. Please follow written instructions given to you at your visit today. If you use inhalers (even only as needed), please bring them with you on the day of your procedure. Your physician has requested that you go to www.startemmi.com and enter the access code given to you at your visit today. This web site gives a general overview about your procedure. However, you should still follow specific instructions given to you by our office regarding your preparation for the procedure.  Please increase your Omeprazole 20mg  to twice a day.  You can take Reglan 5mg , three times a day.  Please discontinue taking Norco.  We have given you a Gastroparesis Diet pamphlet today.   Thank you for entrusting me with your care and for [facility], Dr. Tracy Cellar

## 2017-12-07 ENCOUNTER — Telehealth: Payer: Self-pay | Admitting: Orthopaedic Surgery

## 2017-12-07 MED ORDER — HYDROCODONE-ACETAMINOPHEN 7.5-325 MG PO TABS: ORAL_TABLET | ORAL | 0 refills | Status: DC

## 2017-12-07 NOTE — Telephone Encounter (Signed)
Hydrocodone-Acetaminophen 7.5/325 mg     Pt uses [facility] [facility]

## 2017-12-14 ENCOUNTER — Telehealth: Payer: Self-pay | Admitting: Orthopaedic Surgery

## 2017-12-14 NOTE — Telephone Encounter (Signed)
Patient requests refill:  HYDROcodone-acetaminophen (NORCO) 7.5-325 MG tablet 15 tablet  - [facility]

## 2017-12-15 MED ORDER — HYDROCODONE-ACETAMINOPHEN 7.5-325 MG PO TABS: ORAL_TABLET | ORAL | 0 refills | Status: DC

## 2017-12-21 ENCOUNTER — Ambulatory Visit (AMBULATORY_SURGERY_CENTER): Payer: Medicaid Other | Admitting: Gastroenterology

## 2017-12-21 ENCOUNTER — Telehealth: Payer: Self-pay | Admitting: Orthopaedic Surgery

## 2017-12-21 ENCOUNTER — Encounter: Payer: Self-pay | Admitting: Gastroenterology

## 2017-12-21 ENCOUNTER — Other Ambulatory Visit: Payer: Self-pay

## 2017-12-21 VITALS — BP 128/87 | HR 87 | Temp 97.8°F | Resp 13 | Ht 69.0 in | Wt 204.0 lb

## 2017-12-21 DIAGNOSIS — R1013 Epigastric pain: Secondary | ICD-10-CM | POA: Diagnosis not present

## 2017-12-21 DIAGNOSIS — R6881 Early satiety: Secondary | ICD-10-CM

## 2017-12-21 DIAGNOSIS — R112 Nausea with vomiting, unspecified: Secondary | ICD-10-CM | POA: Diagnosis not present

## 2017-12-21 DIAGNOSIS — K2950 Unspecified chronic gastritis without bleeding: Secondary | ICD-10-CM | POA: Diagnosis not present

## 2017-12-21 MED ORDER — SODIUM CHLORIDE 0.9 % IV SOLN: 500.0000 mL | Freq: Once | INTRAVENOUS | Status: DC

## 2017-12-21 MED ORDER — HYDROCODONE-ACETAMINOPHEN 7.5-325 MG PO TABS: ORAL_TABLET | ORAL | 0 refills | Status: DC

## 2017-12-21 NOTE — Op Note (Signed)
[facility] Patient Name: Anne [name] Procedure Date: 12/21/2017 3:22 PM MRN: [id number] Endoscopist: Remo Lipps P.  MD, MD Age: 37 Referring MD:  Date of Birth: 16-Jan-1981 Gender: Female Account #: [id number] Procedure:                Upper GI endoscopy Indications:              Epigastric abdominal pain, Early satiety, Nausea                            with vomiting, history of diabetes, concern for                            gastroparesis Medicines:                Monitored Anesthesia Care Procedure:                Pre-Anesthesia Assessment:                           - Prior to the procedure, a History and Physical                            was performed, and patient medications and                            allergies were reviewed. The patient's tolerance of                            previous anesthesia was also reviewed. The risks                            and benefits of the procedure and the sedation                            options and risks were discussed with the patient.                            All questions were answered, and informed consent                            was obtained. Prior Anticoagulants: The patient has                            taken no previous anticoagulant or antiplatelet                            agents. ASA Grade Assessment: III - A patient with                            severe systemic disease. After reviewing the risks                            and benefits, the patient was deemed in  satisfactory condition to undergo the procedure.                           After obtaining informed consent, the endoscope was                            passed under direct vision. Throughout the                            procedure, the patient's blood pressure, pulse, and                            oxygen saturations were monitored continuously. The                            Model GIF-[id number])  scope was introduced                            through the mouth, and advanced to the second part                            of duodenum. The upper GI endoscopy was                            accomplished without difficulty. The patient                            tolerated the procedure well. Scope In: Scope Out: Findings:                 Esophagogastric landmarks were identified: the                            Z-line was found at 38 cm, the gastroesophageal                            junction was found at 38 cm and the upper extent of                            the gastric folds was found at 38 cm from the                            incisors.                           The exam of the esophagus was otherwise normal.                           Residual fluid was found in the gastric body /                            fundus. This was suctioned.                           The exam of the  stomach was otherwise normal.                           Biopsies were taken with a cold forceps in the                            gastric body, at the incisura and in the gastric                            antrum for Helicobacter pylori testing.                           The duodenal bulb and second portion of the                            duodenum were normal. Complications:            No immediate complications. Estimated blood loss:                            Minimal. Estimated Blood Loss:     Estimated blood loss was minimal. Impression:               - Esophagogastric landmarks identified.                           - Normal esophagus                           - Retained gastric fluid. Suctioned                           - Normal stomach otherwise, biopsies taken to rule                            out H pylori                           - Normal duodenal bulb and second portion of the                            duodenum.                           Will await pathology results, but I suspect the                             patient very likely has gastroparesis causing her                            symptoms Recommendation:           - Patient has a contact number available for                            emergencies. The signs and symptoms of potential  delayed complications were discussed with the                            patient. Return to normal activities tomorrow.                            Written discharge instructions were provided to the                            patient.                           - Resume previous diet.                           - Continue present medications including trial of                            Reglan as previously discussed                           - Await pathology results, if negative will proceed                            with gastric emptying study to confirm diagnosis                            prior to consideration for trial of domperidone  P.  MD, MD 12/21/2017 3:34:29 PM This report has been signed electronically.

## 2017-12-21 NOTE — Progress Notes (Signed)
Called to room to assist during endoscopic procedure.  Patient ID and intended procedure confirmed with present staff. Received instructions for my participation in the procedure from the performing physician.  

## 2017-12-21 NOTE — Patient Instructions (Signed)
YOU HAD AN ENDOSCOPIC PROCEDURE TODAY AT THE [facility]:   Refer to the procedure report that was given to you for any specific questions about what was found during the examination.  If the procedure report does not answer your questions, please call your gastroenterologist to clarify.  If you requested that your care partner not be given the details of your procedure findings, then the procedure report has been included in a sealed envelope for you to review at your convenience later.  YOU SHOULD EXPECT: Some feelings of bloating in the abdomen. Passage of more gas than usual.  Walking can help get rid of the air that was put into your GI tract during the procedure and reduce the bloating. If you had a lower endoscopy (such as a colonoscopy or flexible sigmoidoscopy) you may notice spotting of blood in your stool or on the toilet paper. If you underwent a bowel prep for your procedure, you may not have a normal bowel movement for a few days.  Please Note:  You might notice some irritation and congestion in your nose or some drainage.  This is from the oxygen used during your procedure.  There is no need for concern and it should clear up in a day or so.  SYMPTOMS TO REPORT IMMEDIATELY:    Following upper endoscopy (EGD)  Vomiting of blood or coffee ground material  New chest pain or pain under the shoulder blades  Painful or persistently difficult swallowing  New shortness of breath  Fever of 100F or higher  Black, tarry-looking stools  For urgent or emergent issues, a gastroenterologist can be reached at any hour by calling [phone].    DIET:  We do recommend a small meal at first, but then you may proceed to your regular diet.  Drink plenty of fluids but you should avoid alcoholic beverages for 24 hours.  ACTIVITY:  You should plan to take it easy for the rest of today and you should NOT DRIVE or use heavy machinery until tomorrow (because of the sedation medicines  used during the test).    FOLLOW UP: Our staff will call the number listed on your records the next business day following your procedure to check on you and address any questions or concerns that you may have regarding the information given to you following your procedure. If we do not reach you, we will leave a message.  However, if you are feeling well and you are not experiencing any problems, there is no need to return our call.  We will assume that you have returned to your regular daily activities without incident.  If any biopsies were taken you will be contacted by phone or by letter within the next 1-3 weeks.  Please call us at [phone] if you have not heard about the biopsies in 3 weeks.    SIGNATURES/CONFIDENTIALITY: You and/or your care partner have signed paperwork which will be entered into your electronic medical record.  These signatures attest to the fact that that the information above on your After Visit Summary has been reviewed and is understood.  Full responsibility of the confidentiality of this discharge information lies with you and/or your care-partner.  Thank you for letting us take care of your healthcare needs today. 

## 2017-12-21 NOTE — Progress Notes (Signed)
Report given to PACU, vss 

## 2017-12-21 NOTE — Telephone Encounter (Signed)
Hydrocodone-Acetaminophen  7.5/325 mg  Qty 15 Tablets   Patient uses [facility]

## 2017-12-22 ENCOUNTER — Telehealth: Payer: Self-pay | Admitting: *Deleted

## 2017-12-22 NOTE — Telephone Encounter (Signed)
  Follow up Call-  Call back number 12/21/2017  Post procedure Call Back phone  # [phone]  Permission to leave phone message Yes  Some recent data might be hidden     Patient questions:  Do you have a fever, pain , or abdominal swelling? No. Pain Score  0 *  Have you tolerated food without any problems? Yes.    Have you been able to return to your normal activities? No.  Do you have any questions about your discharge instructions: Diet   No. Medications  No. Follow up visit  No.  Do you have questions or concerns about your Care? No.  Actions: * If pain score is 4 or above: No action needed, pain <4.

## 2017-12-22 NOTE — Telephone Encounter (Signed)
Pt is returning call and said she is doing okay

## 2017-12-22 NOTE — Telephone Encounter (Signed)
  Follow up Call-  Call back number 12/21/2017  Post procedure Call Back phone  # [phone]  Permission to leave phone message Yes  Some recent data might be hidden     No answer at # given.  LM on VM.

## 2017-12-24 ENCOUNTER — Other Ambulatory Visit: Payer: Self-pay

## 2017-12-24 DIAGNOSIS — K3189 Other diseases of stomach and duodenum: Secondary | ICD-10-CM

## 2017-12-24 DIAGNOSIS — R112 Nausea with vomiting, unspecified: Secondary | ICD-10-CM

## 2017-12-28 ENCOUNTER — Telehealth: Payer: Self-pay | Admitting: Orthopaedic Surgery

## 2017-12-28 MED ORDER — HYDROCODONE-ACETAMINOPHEN 7.5-325 MG PO TABS: ORAL_TABLET | ORAL | 0 refills | Status: DC

## 2017-12-28 NOTE — Telephone Encounter (Signed)
Patient requests refill on Hydrocodone/Acetaminophen 7.5-325  Mgs.  Qty  15       Sig: One every six hours as needed for pain. Seven day limit per Medicaid guidelines.     Patient states she uses [facility]

## 2018-01-03 ENCOUNTER — Ambulatory Visit (HOSPITAL_COMMUNITY)
Admission: RE | Admit: 2018-01-03 | Discharge: 2018-01-03 | Disposition: A | Discharge status: Home / Self Care | Payer: Medicaid Other | Source: Ambulatory Visit | Attending: Gastroenterology | Admitting: Gastroenterology

## 2018-01-03 DIAGNOSIS — K3189 Other diseases of stomach and duodenum: Secondary | ICD-10-CM | POA: Diagnosis present

## 2018-01-03 DIAGNOSIS — R112 Nausea with vomiting, unspecified: Secondary | ICD-10-CM

## 2018-01-03 MED ORDER — TECHNETIUM TC 99M SULFUR COLLOID
2.0000 | Freq: Once | INTRAVENOUS | Status: AC | PRN
  Administered 2018-01-03: 2 via INTRAVENOUS

## 2018-01-04 ENCOUNTER — Telehealth: Payer: Self-pay | Admitting: Orthopaedic Surgery

## 2018-01-04 MED ORDER — HYDROCODONE-ACETAMINOPHEN 7.5-325 MG PO TABS: ORAL_TABLET | ORAL | 0 refills | Status: DC

## 2018-01-04 NOTE — Telephone Encounter (Signed)
Patient requests refill:   HYDROcodone-acetaminophen (NORCO) 7.5-325 MG tablet 12 tablet  - [facility]

## 2018-01-11 ENCOUNTER — Telehealth: Payer: Self-pay | Admitting: Orthopaedic Surgery

## 2018-01-11 NOTE — Telephone Encounter (Signed)
Hydrocodone -Acetaminophen 7.5/325 mg  Qty  12 Tablets  PATIENT USES [facility]

## 2018-01-11 NOTE — Telephone Encounter (Signed)
No more narcotics.  Take Advil, Tylenol or Aleve.

## 2018-01-19 ENCOUNTER — Encounter: Payer: Self-pay | Admitting: Orthopaedic Surgery

## 2018-01-19 ENCOUNTER — Ambulatory Visit: Payer: Medicaid Other | Admitting: Orthopaedic Surgery

## 2018-01-19 VITALS — BP 120/82 | HR 92 | Temp 97.0°F | Ht 69.0 in | Wt 203.0 lb

## 2018-01-19 DIAGNOSIS — G8929 Other chronic pain: Secondary | ICD-10-CM | POA: Diagnosis not present

## 2018-01-19 DIAGNOSIS — M25511 Pain in right shoulder: Secondary | ICD-10-CM | POA: Diagnosis not present

## 2018-01-19 NOTE — Progress Notes (Signed)
Patient Anne [name], female DOB:1981-09-20, 37 y.o. EZM:[id number]  Chief Complaint  Patient presents with  . Shoulder Injury    RIGHT    HPI  Anne [name] is a 37 y.o. female who has chronic right shoulder pain.  She has had more pain with the cold weather.  She has no new trauma.  She has pain with overhead use. HPI  Body mass index is 29.98 kg/m.  ROS  Review of Systems  Constitutional:       Patient has Diabetes Mellitus. Patient has hypertension. Patient does not have COPD or shortness of breath. Patient does not have BMI > 35. Patient does not have current smoking history.  HENT: Negative for congestion.   Respiratory: Negative for cough and shortness of breath.   Cardiovascular: Negative for chest pain and leg swelling.  Gastrointestinal:       GERD  Endocrine: Positive for cold intolerance.  Musculoskeletal: Positive for arthralgias, myalgias and neck pain.  Allergic/Immunologic: Positive for environmental allergies.  All other systems reviewed and are negative.   Past Medical History:  Diagnosis Date  . Acid reflux   . Anxiety   . Arthritis   . Diabetes mellitus type 1 ([place]) 10/31/2011  . Hypercholesteremia   . Hypertension   . Renal failure   . Sciatica     Past Surgical History:  Procedure Laterality Date  . AMPUTATION Right 11/03/2011   Procedure: AMPUTATION RAY;  Surgeon: Jamesetta So;  Location: [facility];  Service: General;  Laterality: Right;  Right fourth and fifth metatarsal   . APPENDECTOMY    . CESAREAN SECTION  2004 and 2007   x2  . TUBAL LIGATION      Family History  Problem Relation Age of Onset  . Diabetes Father   . Lung cancer Father   . Alcoholism Father   . Asthma Mother   . Kidney disease Mother   . Anemia Mother        hemolytic  . Hypertension Mother   . Heart attack Paternal Grandmother   . Diabetes Paternal Grandmother   . Diabetes Paternal Grandfather   . Anesthesia problems Neg Hx   . Hypotension Neg Hx    . Malignant hyperthermia Neg Hx   . Pseudochol deficiency Neg Hx     Social History Social History   Tobacco Use  . Smoking status: Former Smoker    Packs/day: 0.25    Years: 20.00    Pack years: 5.00    Types: Cigarettes    Last attempt to quit: 12/29/2013    Years since quitting: 4.0  . Smokeless tobacco: Never Used  Substance Use Topics  . Alcohol use: No  . Drug use: No    No Known Allergies  Current Outpatient Medications  Medication Sig Dispense Refill  . Blood Glucose Monitoring Suppl (ACCU-CHEK AVIVA) device Use as instructed 1 each 0  . cholecalciferol (VITAMIN D) 400 UNITS TABS tablet Take 400 Units by mouth.    Marland Kitchen gemfibrozil (LOPID) 600 MG tablet TAKE (1) TABLET BY MOUTH TWICE A DAY BEFORE MEALS. (BREAKFAST AND SUPPER) 60 tablet 2  . HYDROcodone-acetaminophen (NORCO) 7.5-325 MG tablet One every six hours as needed for pain.  Seven day limit per Medicaid guidelines. 12 tablet 0  . LANTUS SOLOSTAR 100 UNIT/ML Solostar Pen INNJECT 50 UNITS S.Q. ONCE DAILY AT 10 P.M. (Patient taking differently: INNJECT 70 UNITS S.Q. ONCE DAILY AT 10 P.M.) 15 mL 2  . metFORMIN (GLUCOPHAGE) 500 MG tablet TAKE  1 TABLET BY MOUTH TWICE DAILY WITH MEALS. (Patient taking differently: TAKE 1 TABLET BY MOUTH FOUR TIMES A DAY) 60 tablet 2  . metoCLOPramide (REGLAN) 10 MG/10ML SOLN Take 5 mg by mouth 3 (three) times daily. 3 TO 4 TIMES DAILY for possible Gastroparesis     . NOVOLOG FLEXPEN 100 UNIT/ML FlexPen INJECT 12-18 UNITS S.Q. THREE TIMES DAILY WITH MEALS. 15 mL 3  . Omega-3 Fatty Acids (FISH OIL) 1000 MG CAPS Take by mouth.    Marland Kitchen omeprazole (PRILOSEC) 20 MG capsule Take 20 mg by mouth 2 (two) times daily.    . Potassium 99 MG TABS Take 1 tablet by mouth daily.    . sertraline (ZOLOFT) 100 MG tablet Take 100 mg by mouth daily.     Current Facility-Administered Medications  Medication Dose Route Frequency Provider Last Rate Last Dose  . 0.9 %  sodium chloride infusion  500 mL Intravenous  Once Armbruster, Carlota Raspberry, MD         Physical Exam  Blood pressure 120/82, pulse 92, temperature (!) 97 F (36.1 C), height 5\' 9"  (1.753 m), weight 203 lb (92.1 kg).  Constitutional: overall normal hygiene, normal nutrition, well developed, normal grooming, normal body habitus. Assistive device:none  Musculoskeletal: gait and station Limp none, muscle tone and strength are normal, no tremors or atrophy is present.  .  Neurological: coordination overall normal.  Deep tendon reflex/nerve stretch intact.  Sensation normal.  Cranial nerves II-XII intact.   Skin:   Normal overall no scars, lesions, ulcers or rashes. No psoriasis.  Psychiatric: Alert and oriented x 3.  Recent memory intact, remote memory unclear.  Normal mood and affect. Well groomed.  Good eye contact.  Cardiovascular: overall no swelling, no varicosities, no edema bilaterally, normal temperatures of the legs and arms, no clubbing, cyanosis and good capillary refill.  Lymphatic: palpation is normal.  Examination of right Upper Extremity is done.  Inspection:   Overall:  Elbow non-tender without crepitus or defects, forearm non-tender without crepitus or defects, wrist non-tender without crepitus or defects, hand non-tender.    Shoulder: with glenohumeral joint tenderness, without effusion.   Upper arm: without swelling and tenderness   Range of motion:   Overall:  Full range of motion of the elbow, full range of motion of wrist and full range of motion in fingers.   Shoulder:  right  150 degrees forward flexion; 120 degrees abduction; 35 degrees internal rotation, 35 degrees external rotation, 15 degrees extension, 40 degrees adduction.   Stability:   Overall:  Shoulder, elbow and wrist stable   Strength and Tone:   Overall full shoulder muscles strength, full upper arm strength and normal upper arm bulk and tone.  All other systems reviewed and are negative   The patient has been educated about the nature of  the problem(s) and counseled on treatment options.  The patient appeared to understand what I have discussed and is in agreement with it.  Encounter Diagnosis  Name Primary?  . Chronic right shoulder pain Yes    PLAN Call if any problems.  Precautions discussed.  Continue current medications.   Return to clinic 3 months   Electronically Signed Sanjuana Kava, MD 2/27/20192:18 PM

## 2018-04-20 ENCOUNTER — Ambulatory Visit: Payer: Medicaid Other | Admitting: Orthopaedic Surgery

## 2018-04-20 ENCOUNTER — Encounter: Payer: Self-pay | Admitting: Orthopaedic Surgery

## 2018-04-20 VITALS — BP 156/97 | HR 89 | Temp 97.8°F | Ht 70.0 in | Wt 211.0 lb

## 2018-04-20 DIAGNOSIS — M25511 Pain in right shoulder: Secondary | ICD-10-CM | POA: Diagnosis not present

## 2018-04-20 DIAGNOSIS — G8929 Other chronic pain: Secondary | ICD-10-CM | POA: Diagnosis not present

## 2018-04-20 MED ORDER — HYDROCODONE-ACETAMINOPHEN 7.5-325 MG PO TABS: ORAL_TABLET | ORAL | 0 refills | Status: DC

## 2018-04-20 NOTE — Progress Notes (Signed)
Patient Anne [name], female DOB:13-Sep-1981, 37 y.o. BOF:[id number]  Chief Complaint  Patient presents with  . Shoulder Pain    right     HPI  Anne [name] is a 37 y.o. female who has right shoulder pain.  She has had increased pain over the last month.  She has been helping her husband at times and it has caused her shoulder to hurt more.  She has no direct trauma, no numbness.   HPI  Body mass index is 30.28 kg/m.  ROS  Review of Systems  Constitutional:       Patient has Diabetes Mellitus. Patient has hypertension. Patient does not have COPD or shortness of breath. Patient does not have BMI > 35. Patient does not have current smoking history.  HENT: Negative for congestion.   Respiratory: Negative for cough and shortness of breath.   Cardiovascular: Negative for chest pain and leg swelling.  Gastrointestinal:       GERD  Endocrine: Positive for cold intolerance.  Musculoskeletal: Positive for arthralgias, gait problem, myalgias and neck pain.  Allergic/Immunologic: Positive for environmental allergies.  All other systems reviewed and are negative.   Past Medical History:  Diagnosis Date  . Acid reflux   . Anxiety   . Arthritis   . Diabetes mellitus type 1 ([place]) 10/31/2011  . Hypercholesteremia   . Hypertension   . Renal failure   . Sciatica     Past Surgical History:  Procedure Laterality Date  . AMPUTATION Right 11/03/2011   Procedure: AMPUTATION RAY;  Surgeon: Jamesetta So;  Location: [facility];  Service: General;  Laterality: Right;  Right fourth and fifth metatarsal   . APPENDECTOMY    . CESAREAN SECTION  2004 and 2007   x2  . TUBAL LIGATION      Family History  Problem Relation Age of Onset  . Diabetes Father   . Lung cancer Father   . Alcoholism Father   . Asthma Mother   . Kidney disease Mother   . Anemia Mother        hemolytic  . Hypertension Mother   . Heart attack Paternal Grandmother   . Diabetes Paternal Grandmother   .  Diabetes Paternal Grandfather   . Anesthesia problems Neg Hx   . Hypotension Neg Hx   . Malignant hyperthermia Neg Hx   . Pseudochol deficiency Neg Hx     Social History Social History   Tobacco Use  . Smoking status: Former Smoker    Packs/day: 0.25    Years: 20.00    Pack years: 5.00    Types: Cigarettes    Last attempt to quit: 12/29/2013    Years since quitting: 4.3  . Smokeless tobacco: Never Used  Substance Use Topics  . Alcohol use: No  . Drug use: No    No Known Allergies  Current Outpatient Medications  Medication Sig Dispense Refill  . Blood Glucose Monitoring Suppl (ACCU-CHEK AVIVA) device Use as instructed 1 each 0  . cholecalciferol (VITAMIN D) 400 UNITS TABS tablet Take 400 Units by mouth.    Marland Kitchen gemfibrozil (LOPID) 600 MG tablet TAKE (1) TABLET BY MOUTH TWICE A DAY BEFORE MEALS. (BREAKFAST AND SUPPER) 60 tablet 2  . LANTUS SOLOSTAR 100 UNIT/ML Solostar Pen INNJECT 50 UNITS S.Q. ONCE DAILY AT 10 P.M. (Patient taking differently: INNJECT 70 UNITS S.Q. ONCE DAILY AT 10 P.M.) 15 mL 2  . metFORMIN (GLUCOPHAGE) 500 MG tablet TAKE 1 TABLET BY MOUTH TWICE DAILY WITH MEALS. (  Patient taking differently: TAKE 1 TABLET BY MOUTH FOUR TIMES A DAY) 60 tablet 2  . metoCLOPramide (REGLAN) 10 MG/10ML SOLN Take 5 mg by mouth 3 (three) times daily. 3 TO 4 TIMES DAILY for possible Gastroparesis     . NOVOLOG FLEXPEN 100 UNIT/ML FlexPen INJECT 12-18 UNITS S.Q. THREE TIMES DAILY WITH MEALS. 15 mL 3  . Omega-3 Fatty Acids (FISH OIL) 1000 MG CAPS Take by mouth.    Marland Kitchen omeprazole (PRILOSEC) 20 MG capsule Take 20 mg by mouth 2 (two) times daily.    . Potassium 99 MG TABS Take 1 tablet by mouth daily.    . sertraline (ZOLOFT) 100 MG tablet Take 100 mg by mouth daily.    Marland Kitchen HYDROcodone-acetaminophen (NORCO) 7.5-325 MG tablet One tablet every four hours as needed for pain.  Five day supply for acute pain per [facility]. 30 tablet 0   Current Facility-Administered Medications  Medication Dose  Route Frequency Provider Last Rate Last Dose  . 0.9 %  sodium chloride infusion  500 mL Intravenous Once Armbruster, Carlota Raspberry, MD         Physical Exam  Blood pressure (!) 156/97, pulse 89, temperature 97.8 F (36.6 C), height 5\' 10"  (1.778 m), weight 211 lb (95.7 kg).  Constitutional: overall normal hygiene, normal nutrition, well developed, normal grooming, normal body habitus. Assistive device:cane  Musculoskeletal: gait and station Limp right, muscle tone and strength are normal, no tremors or atrophy is present. She has partial amputation of the right foot. .  Neurological: coordination overall normal.  Deep tendon reflex/nerve stretch intact.  Sensation normal.  Cranial nerves II-XII intact.   Skin:   Normal overall no scars, lesions, ulcers or rashes. No psoriasis.  Psychiatric: Alert and oriented x 3.  Recent memory intact, remote memory unclear.  Normal mood and affect. Well groomed.  Good eye contact.  Cardiovascular: overall no swelling, no varicosities, no edema bilaterally, normal temperatures of the legs and arms, no clubbing, cyanosis and good capillary refill.  Lymphatic: palpation is normal.  Examination of right Upper Extremity is done.  Inspection:   Overall:  Elbow non-tender without crepitus or defects, forearm non-tender without crepitus or defects, wrist non-tender without crepitus or defects, hand non-tender.    Shoulder: with glenohumeral joint tenderness, without effusion.   Upper arm: without swelling and tenderness   Range of motion:   Overall:  Full range of motion of the elbow, full range of motion of wrist and full range of motion in fingers.   Shoulder:  right  150 degrees forward flexion; 145 degrees abduction; 30 degrees internal rotation, 30 degrees external rotation, 10 degrees extension, 30 degrees adduction.   Stability:   Overall:  Shoulder, elbow and wrist stable   Strength and Tone:   Overall full shoulder muscles strength, full upper  arm strength and normal upper arm bulk and tone.  All other systems reviewed and are negative   The patient has been educated about the nature of the problem(s) and counseled on treatment options.  The patient appeared to understand what I have discussed and is in agreement with it.  Encounter Diagnosis  Name Primary?  . Chronic right shoulder pain Yes    PLAN Call if any problems.  Precautions discussed.  Continue current medications.   Return to clinic 3 months   I have reviewed the [place] web site prior to prescribing narcotic medicine for this patient.  Electronically Signed Sanjuana Kava, MD 5/29/20194:16  PM   

## 2018-05-02 ENCOUNTER — Telehealth: Payer: Self-pay | Admitting: Orthopaedic Surgery

## 2018-05-02 NOTE — Telephone Encounter (Signed)
Hydrocodone-Acetaminophen  7.5/325 mg  Qty 30 Tablets  PATIENT USES [facility]

## 2018-05-03 MED ORDER — HYDROCODONE-ACETAMINOPHEN 7.5-325 MG PO TABS: ORAL_TABLET | ORAL | 0 refills | Status: DC

## 2018-05-11 ENCOUNTER — Telehealth: Payer: Self-pay | Admitting: Orthopaedic Surgery

## 2018-05-11 MED ORDER — HYDROCODONE-ACETAMINOPHEN 7.5-325 MG PO TABS: ORAL_TABLET | ORAL | 0 refills | Status: DC

## 2018-05-11 NOTE — Telephone Encounter (Signed)
Patient called for refill:  HYDROcodone-acetaminophen (NORCO) 7.5-325 MG tablet 20 tablet  -[facility]

## 2018-05-16 ENCOUNTER — Telehealth: Payer: Self-pay | Admitting: Orthopaedic Surgery

## 2018-05-16 NOTE — Telephone Encounter (Signed)
Patient requests refill:  HYDROcodone-acetaminophen (NORCO) 7.5-325 MG tablet 20 tablet   - [facility]

## 2018-05-17 NOTE — Telephone Encounter (Signed)
She should take Advil or Aleve.  No narcotics.

## 2018-05-17 NOTE — Telephone Encounter (Signed)
Called patient to notify. Voice mail is not set up; unable to leave a message.

## 2018-05-18 NOTE — Telephone Encounter (Signed)
Have tried calling back to patient (05/17/18, approximately 5:15pm) and also tried alternate contact phone [phone]; left a message at this number.

## 2018-05-18 NOTE — Telephone Encounter (Signed)
Patient returned call; voiced understanding. °

## 2018-05-23 ENCOUNTER — Emergency Department (HOSPITAL_COMMUNITY)
Admission: EM | Admit: 2018-05-23 | Discharge: 2018-05-23 | Disposition: A | Discharge status: Home / Self Care | Payer: Medicaid Other | Attending: Emergency Medicine | Admitting: Emergency Medicine

## 2018-05-23 ENCOUNTER — Emergency Department (HOSPITAL_COMMUNITY): Payer: Medicaid Other

## 2018-05-23 ENCOUNTER — Other Ambulatory Visit: Payer: Self-pay

## 2018-05-23 ENCOUNTER — Encounter (HOSPITAL_COMMUNITY): Payer: Self-pay | Admitting: Emergency Medicine

## 2018-05-23 DIAGNOSIS — M79672 Pain in left foot: Secondary | ICD-10-CM | POA: Diagnosis present

## 2018-05-23 DIAGNOSIS — Z79899 Other long term (current) drug therapy: Secondary | ICD-10-CM | POA: Diagnosis not present

## 2018-05-23 DIAGNOSIS — E10621 Type 1 diabetes mellitus with foot ulcer: Secondary | ICD-10-CM | POA: Diagnosis not present

## 2018-05-23 DIAGNOSIS — Z794 Long term (current) use of insulin: Secondary | ICD-10-CM | POA: Diagnosis not present

## 2018-05-23 DIAGNOSIS — I10 Essential (primary) hypertension: Secondary | ICD-10-CM | POA: Diagnosis not present

## 2018-05-23 DIAGNOSIS — Z87891 Personal history of nicotine dependence: Secondary | ICD-10-CM | POA: Insufficient documentation

## 2018-05-23 DIAGNOSIS — L97529 Non-pressure chronic ulcer of other part of left foot with unspecified severity: Secondary | ICD-10-CM | POA: Diagnosis not present

## 2018-05-23 LAB — BASIC METABOLIC PANEL
ANION GAP: 10 (ref 5–15)
BUN: 20 mg/dL (ref 6–20)
CHLORIDE: 99 mmol/L (ref 98–111)
CO2: 23 mmol/L (ref 22–32)
Calcium: 8.5 mg/dL — ABNORMAL LOW (ref 8.9–10.3)
Creatinine, Ser: 1.06 mg/dL — ABNORMAL HIGH (ref 0.44–1.00)
GFR calc non Af Amer: >60 mL/min (ref >60)
GLUCOSE: 317 mg/dL — AB (ref 70–99)
POTASSIUM: 3.8 mmol/L (ref 3.5–5.1)
Sodium: 132 mmol/L — ABNORMAL LOW (ref 135–145)

## 2018-05-23 LAB — CBC WITH DIFFERENTIAL/PLATELET
BASOS PCT: 1 %
Basophils Absolute: 0 10*3/uL (ref 0.0–0.1)
Eosinophils Absolute: 0.1 10*3/uL (ref 0.0–0.7)
Eosinophils Relative: 2 %
HEMATOCRIT: 39.5 % (ref 36.0–46.0)
Hemoglobin: 13.6 g/dL (ref 12.0–15.0)
Lymphocytes Relative: 19 %
Lymphs Abs: 1 10*3/uL (ref 0.7–4.0)
MCH: 30.8 pg (ref 26.0–34.0)
MCHC: 34.4 g/dL (ref 30.0–36.0)
MCV: 89.6 fL (ref 78.0–100.0)
MONO ABS: 0.4 10*3/uL (ref 0.1–1.0)
MONOS PCT: 8 %
NEUTROS ABS: 3.7 10*3/uL (ref 1.7–7.7)
Neutrophils Relative %: 70 %
Platelets: 164 10*3/uL (ref 150–400)
RBC: 4.41 MIL/uL (ref 3.87–5.11)
RDW: 12.5 % (ref 11.5–15.5)
WBC: 5.3 10*3/uL (ref 4.0–10.5)

## 2018-05-23 LAB — CBG MONITORING, ED: Glucose-Capillary: 246 mg/dL — ABNORMAL HIGH (ref 70–99)

## 2018-05-23 MED ORDER — SODIUM CHLORIDE 0.9 % IV SOLN
INTRAVENOUS | Status: DC
  Administered 2018-05-23: 21:00:00 via INTRAVENOUS

## 2018-05-23 MED ORDER — DOXYCYCLINE HYCLATE 100 MG PO CAPS: 100.0000 mg | ORAL_CAPSULE | Freq: Two times a day (BID) | ORAL | 0 refills | Status: DC

## 2018-05-23 MED ORDER — VANCOMYCIN HCL IN DEXTROSE 1-5 GM/200ML-% IV SOLN
1000.0000 mg | Freq: Once | INTRAVENOUS | Status: AC
  Administered 2018-05-23: 1000 mg via INTRAVENOUS
  Filled 2018-05-23: qty 200

## 2018-05-23 MED ORDER — ONDANSETRON HCL 4 MG/2ML IJ SOLN
4.0000 mg | Freq: Once | INTRAMUSCULAR | Status: AC
Start: 2018-05-23 — End: 2018-05-23
  Administered 2018-05-23: 4 mg via INTRAVENOUS
  Filled 2018-05-23: qty 2

## 2018-05-23 MED ORDER — HYDROMORPHONE HCL 1 MG/ML IJ SOLN
1.0000 mg | Freq: Once | INTRAMUSCULAR | Status: AC
  Administered 2018-05-23: 1 mg via INTRAVENOUS
  Filled 2018-05-23: qty 1

## 2018-05-23 NOTE — ED Triage Notes (Addendum)
hAs wound to left foot. Seen at wound center Thursday treated and cultured area. Pt c/o intermittent fever one day. And was told by wound doctor to get seen if fever occurred. N/v/chills started yesterday

## 2018-05-23 NOTE — Discharge Instructions (Addendum)
Follow-up with your podiatrist.  Switch to the doxycycline as an antibiotic.  Return for any new or worse symptoms.  Keep your appointment to podiatrist on Friday.  Continue your wound care.  Stop your current antibiotic.

## 2018-05-23 NOTE — ED Provider Notes (Signed)
[facility] EMERGENCY DEPARTMENT Provider Note   CSN: [id number] Arrival date & time: 05/23/18  1744     History   Chief Complaint Chief Complaint  Patient presents with  . Foot Pain    HPI Anne [name] is a 37 y.o. female.  Patient followed by podiatry for the wound on the sole of her left foot.  Patient has a history of diabetes.  Patient currently on Septra for this.  Patient started to develop intermittent fevers today contacted her podiatrist recommended follow-up.  Afebrile here.  Patient has been soaking the foot daily taking the antibiotics and and redressing it.  She has follow-up with podiatry on Friday.  Patient denies headaches shortness of breath chest pain abdominal pain nausea vomiting or diarrhea or dysuria.     Past Medical History:  Diagnosis Date  . Acid reflux   . Anxiety   . Arthritis   . Diabetes mellitus type 1 ([employer]) 10/31/2011  . Hypercholesteremia   . Hypertension   . Renal failure   . Sciatica     Patient Active Problem List   Diagnosis Date Noted  . Personal history of noncompliance with medical treatment, presenting hazards to health 10/28/2015  . Multiple nevi 10/07/2012  . Toe ulcer ([place]) 03/11/2012  . Essential hypertension, benign 03/11/2012  . 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 Diabetic neuropathy ([place]) 02/11/2012  . Back pain 02/11/2012  . Muscle spasm 02/11/2012  . Hyperlipidemia 02/11/2012  . Obesity 02/11/2012  . Cellulitis in diabetic foot ([place]) 10/31/2011    Past Surgical History:  Procedure Laterality Date  . AMPUTATION Right 11/03/2011   Procedure: AMPUTATION RAY;  Surgeon: Jamesetta So;  Location: [facility];  Service: General;  Laterality: Right;  Right fourth and fifth metatarsal   . APPENDECTOMY    . CESAREAN SECTION  2004 and 2007   x2  . TUBAL LIGATION       OB History   None      Home Medications    Prior to Admission medications   Medication Sig Start Date End Date  Taking? Authorizing Provider  cholecalciferol (VITAMIN D) 400 UNITS TABS tablet Take 400 Units by mouth.   Yes [provider]  gemfibrozil (LOPID) 600 MG tablet TAKE (1) TABLET BY MOUTH TWICE A DAY BEFORE MEALS. (BREAKFAST AND SUPPER) 02/03/16  Yes Nida, Marella Chimes, MD  HYDROcodone-acetaminophen (NORCO) 7.5-325 MG tablet One tablet every four hours as needed for pain. 05/11/18  Yes Sanjuana Kava, MD  LANTUS SOLOSTAR 100 UNIT/ML Solostar Pen INNJECT 50 UNITS S.Q. ONCE DAILY AT 10 P.M. Patient taking differently: INNJECT 70 UNITS S.Q. ONCE DAILY AT 10 P.M. 02/03/16  Yes Nida, Marella Chimes, MD  metFORMIN (GLUCOPHAGE) 500 MG tablet TAKE 1 TABLET BY MOUTH TWICE DAILY WITH MEALS. Patient taking differently: 1 tablet three times daily 02/03/16  Yes Nida, Marella Chimes, MD  metoCLOPramide (REGLAN) 10 MG/10ML SOLN Take 5 mg by mouth 3 (three) times daily. 3 TO 4 TIMES DAILY for possible Gastroparesis    Yes [provider]  naproxen sodium (ALEVE) 220 MG tablet Take 220 mg by mouth daily as needed.   Yes [provider]  NOVOLOG FLEXPEN 100 UNIT/ML FlexPen INJECT 12-18 UNITS S.Q. THREE TIMES DAILY WITH MEALS. 04/29/16  Yes Nida, Marella Chimes, MD  Omega-3 Fatty Acids (FISH OIL) 1000 MG CAPS Take by mouth.   Yes [provider]  omeprazole (PRILOSEC) 20 MG capsule Take 20 mg by mouth 2 (  two) times daily.   Yes [provider]  Potassium 99 MG TABS Take 1 tablet by mouth daily.   Yes [provider]  sertraline (ZOLOFT) 100 MG tablet Take 100 mg by mouth daily.   Yes [provider]  sulfamethoxazole-trimethoprim (BACTRIM DS,SEPTRA DS) 800-160 MG tablet Take 1 tablet by mouth 2 (two) times daily. 05/19/18  Yes [provider]  valsartan (DIOVAN) 160 MG tablet Take 160 mg by mouth daily.   Yes [provider]  Blood Glucose Monitoring Suppl (ACCU-CHEK AVIVA) device Use as instructed 10/28/15   Cassandria Anger, MD    doxycycline (VIBRAMYCIN) 100 MG capsule Take 1 capsule (100 mg total) by mouth 2 (two) times daily. 05/23/18   Fredia Sorrow, MD    Family History Family History  Problem Relation Age of Onset  . Diabetes Father   . Lung cancer Father   . Alcoholism Father   . Asthma Mother   . Kidney disease Mother   . Anemia Mother        hemolytic  . Hypertension Mother   . Heart attack Paternal Grandmother   . Diabetes Paternal Grandmother   . Diabetes Paternal Grandfather   . Anesthesia problems Neg Hx   . Hypotension Neg Hx   . Malignant hyperthermia Neg Hx   . Pseudochol deficiency Neg Hx     Social History Social History   Tobacco Use  . Smoking status: Former Smoker    Packs/day: 0.25    Years: 20.00    Pack years: 5.00    Types: Cigarettes    Last attempt to quit: 12/29/2013    Years since quitting: 4.4  . Smokeless tobacco: Never Used  Substance Use Topics  . Alcohol use: No  . Drug use: No     Allergies   Patient has no known allergies.   Review of Systems Review of Systems  Constitutional: Positive for fever.  HENT: Negative for congestion.   Eyes: Negative for redness.  Respiratory: Negative for shortness of breath.   Cardiovascular: Negative for chest pain.  Gastrointestinal: Negative for abdominal pain.  Genitourinary: Negative for dysuria.  Musculoskeletal: Negative for myalgias and neck stiffness.  Skin: Positive for wound.  Neurological: Negative for headaches.  Hematological: Does not bruise/bleed easily.  Psychiatric/Behavioral: Negative for confusion.     Physical Exam Updated Vital Signs BP (!) 131/96   Pulse 88   Temp 97.7 F (36.5 C) (Oral)   Resp 18   Ht 1.778 m (5\' 10" )   Wt 93 kg (205 lb)   LMP 05/02/2018   SpO2 98%   BMI 29.41 kg/m   Physical Exam  Constitutional: She is oriented to person, place, and time. She appears well-developed. No distress.  HENT:  Head: Normocephalic and atraumatic.  Mouth/Throat: Oropharynx is clear  and moist.  Eyes: Pupils are equal, round, and reactive to light. Conjunctivae and EOM are normal.  Neck: Neck supple.  Cardiovascular: Normal rate, regular rhythm and normal heart sounds.  Pulmonary/Chest: Effort normal and breath sounds normal.  Abdominal: Soft. Bowel sounds are normal.  Musculoskeletal: Normal range of motion.  Left foot plantar surface with an open ulcer at the ball of the foot measuring about 2 cm.  Purulent discharge.  No surrounding erythema.  Cap refill to toes is good dorsalis pedis pulse one plus.  No erythema extending up the leg.  The base of the great toe a little bit of a healed ulcer.  No evidence of infection to that.  Neurological: She is alert and oriented to person, place, and time. She displays normal reflexes. No cranial nerve deficit or sensory deficit. She exhibits normal muscle tone.  Skin: Skin is warm. No rash noted.  Nursing note and vitals reviewed.    ED Treatments / Results  Labs (all labs ordered are listed, but only abnormal results are displayed) Labs Reviewed  BASIC METABOLIC PANEL - Abnormal; Notable for the following components:      Result Value   Sodium 132 (*)    Glucose, Bld 317 (*)    Creatinine, Ser 1.06 (*)    Calcium 8.5 (*)    All other components within normal limits  CBG MONITORING, ED - Abnormal; Notable for the following components:   Glucose-Capillary 246 (*)    All other components within normal limits  CBC WITH DIFFERENTIAL/PLATELET    EKG None  Radiology Dg Foot Complete Left  Result Date: 05/23/2018 CLINICAL DATA:  37 year old female with chronic ulcer at the base of the fifth toe. EXAM: LEFT FOOT - COMPLETE 3+ VIEW COMPARISON:  Left foot radiograph dated 04/23/2015 FINDINGS: There is no acute fracture or dislocation. The bones are well mineralized. No arthritic changes. No periosteal reaction or bone resorption. There is skin defect and ulceration of the plantar aspect of the fifth toe at the fifth  metatarsophalangeal joint. IMPRESSION: 1. Skin ulcer at the plantar aspect of the fifth MTP joint. 2. No acute fracture or dislocation. No evidence of acute osteomyelitis by radiograph. MRI or a WBC nuclear scan may provide better evaluation if there is high clinical concern for acute osteomyelitis. Electronically Signed   By: Anner Crete M.D.   On: 05/23/2018 21:17    Procedures Procedures (including critical care time)  Medications Ordered in ED Medications  0.9 %  sodium chloride infusion ( Intravenous New Bag/Given 05/23/18 2114)  vancomycin (VANCOCIN) IVPB 1000 mg/200 mL premix (1,000 mg Intravenous New Bag/Given 05/23/18 2114)  ondansetron [facility]) injection 4 mg (4 mg Intravenous Given 05/23/18 2158)  HYDROmorphone (DILAUDID) injection 1 mg (1 mg Intravenous Given 05/23/18 2159)     Initial Impression / Assessment and Plan / ED Course  I have reviewed the triage vital signs and the nursing notes.  Pertinent labs & imaging results that were available during my care of the patient were reviewed by me and considered in my medical decision making (see chart for details).    Patient with ulcer to the plantar surface of the left foot at the ball of the foot.  Towards the little toe.  With purulent discharge.  No significant erythema.  No cellulitis extending up the leg.  Good cap refill.  Dorsalis pedis pulse one plus.  Also with a non-open somewhat healed ulcer to the base of the great toe.  Patient has been on Septra.  No leukocytosis no fevers here was slightly tachycardic on presentation.  Patient nontoxic no acute distress.  Patient received IV vancomycin and will be switched over to doxycycline and follow-up with her podiatrist.  Patient will continue her wound care.  She will return for any new or worse symptoms.   Final Clinical Impressions(s) / ED Diagnoses   Final diagnoses:  Ulcer of left foot, unspecified ulcer stage [facility])    ED Discharge Orders        Ordered     doxycycline (VIBRAMYCIN) 100 MG capsule  2 times daily     05/23/18 2154       Fredia Sorrow, MD 05/23/18 2203

## 2018-05-24 ENCOUNTER — Ambulatory Visit: Payer: Medicaid Other | Admitting: Gastroenterology

## 2018-07-01 ENCOUNTER — Encounter (HOSPITAL_COMMUNITY): Payer: Self-pay | Admitting: Emergency Medicine

## 2018-07-01 ENCOUNTER — Other Ambulatory Visit: Payer: Self-pay

## 2018-07-01 ENCOUNTER — Inpatient Hospital Stay (HOSPITAL_COMMUNITY): Payer: Medicaid Other

## 2018-07-01 ENCOUNTER — Emergency Department (HOSPITAL_COMMUNITY): Payer: Medicaid Other

## 2018-07-01 ENCOUNTER — Inpatient Hospital Stay (HOSPITAL_COMMUNITY)
Admission: EM | Admit: 2018-07-01 | Discharge: 2018-07-03 | DRG: 638 | Disposition: A | Discharge status: Home Health | Payer: Medicaid Other | Attending: Internal Medicine | Admitting: Internal Medicine

## 2018-07-01 DIAGNOSIS — E1169 Type 2 diabetes mellitus with other specified complication: Secondary | ICD-10-CM | POA: Diagnosis present

## 2018-07-01 DIAGNOSIS — E7800 Pure hypercholesterolemia, unspecified: Secondary | ICD-10-CM | POA: Diagnosis present

## 2018-07-01 DIAGNOSIS — M86172 Other acute osteomyelitis, left ankle and foot: Secondary | ICD-10-CM

## 2018-07-01 DIAGNOSIS — Z89431 Acquired absence of right foot: Secondary | ICD-10-CM

## 2018-07-01 DIAGNOSIS — E1159 Type 2 diabetes mellitus with other circulatory complications: Secondary | ICD-10-CM | POA: Diagnosis present

## 2018-07-01 DIAGNOSIS — E1165 Type 2 diabetes mellitus with hyperglycemia: Secondary | ICD-10-CM | POA: Diagnosis present

## 2018-07-01 DIAGNOSIS — E1140 Type 2 diabetes mellitus with diabetic neuropathy, unspecified: Secondary | ICD-10-CM | POA: Diagnosis present

## 2018-07-01 DIAGNOSIS — Z87891 Personal history of nicotine dependence: Secondary | ICD-10-CM | POA: Diagnosis not present

## 2018-07-01 DIAGNOSIS — L03116 Cellulitis of left lower limb: Secondary | ICD-10-CM | POA: Diagnosis present

## 2018-07-01 DIAGNOSIS — L97529 Non-pressure chronic ulcer of other part of left foot with unspecified severity: Secondary | ICD-10-CM | POA: Diagnosis present

## 2018-07-01 DIAGNOSIS — E11621 Type 2 diabetes mellitus with foot ulcer: Secondary | ICD-10-CM | POA: Diagnosis present

## 2018-07-01 DIAGNOSIS — L03119 Cellulitis of unspecified part of limb: Secondary | ICD-10-CM

## 2018-07-01 DIAGNOSIS — M869 Osteomyelitis, unspecified: Secondary | ICD-10-CM | POA: Diagnosis present

## 2018-07-01 DIAGNOSIS — E11628 Type 2 diabetes mellitus with other skin complications: Secondary | ICD-10-CM | POA: Diagnosis present

## 2018-07-01 DIAGNOSIS — I10 Essential (primary) hypertension: Secondary | ICD-10-CM | POA: Diagnosis present

## 2018-07-01 DIAGNOSIS — E1065 Type 1 diabetes mellitus with hyperglycemia: Secondary | ICD-10-CM

## 2018-07-01 DIAGNOSIS — Z794 Long term (current) use of insulin: Secondary | ICD-10-CM | POA: Diagnosis not present

## 2018-07-01 DIAGNOSIS — R739 Hyperglycemia, unspecified: Secondary | ICD-10-CM | POA: Diagnosis present

## 2018-07-01 DIAGNOSIS — Z833 Family history of diabetes mellitus: Secondary | ICD-10-CM

## 2018-07-01 LAB — CBC WITH DIFFERENTIAL/PLATELET
BASOS ABS: 0 10*3/uL (ref 0.0–0.1)
BASOS ABS: 0 10*3/uL (ref 0.0–0.1)
Basophils Relative: 0 %
Basophils Relative: 0 %
EOS PCT: 2 %
Eosinophils Absolute: 0.2 10*3/uL (ref 0.0–0.7)
Eosinophils Absolute: 0.2 10*3/uL (ref 0.0–0.7)
Eosinophils Relative: 2 %
HCT: 34.3 % — ABNORMAL LOW (ref 36.0–46.0)
HEMATOCRIT: 35.4 % — AB (ref 36.0–46.0)
HEMOGLOBIN: 12.3 g/dL (ref 12.0–15.0)
HEMOGLOBIN: 11.6 g/dL — AB (ref 12.0–15.0)
LYMPHS ABS: 1.8 10*3/uL (ref 0.7–4.0)
LYMPHS PCT: 15 %
LYMPHS PCT: 16 %
Lymphs Abs: 1.9 10*3/uL (ref 0.7–4.0)
MCH: 30.8 pg (ref 26.0–34.0)
MCH: 30.1 pg (ref 26.0–34.0)
MCHC: 34.7 g/dL (ref 30.0–36.0)
MCHC: 33.8 g/dL (ref 30.0–36.0)
MCV: 88.7 fL (ref 78.0–100.0)
MCV: 88.9 fL (ref 78.0–100.0)
MONO ABS: 0.6 10*3/uL (ref 0.1–1.0)
Monocytes Absolute: 0.7 10*3/uL (ref 0.1–1.0)
Monocytes Relative: 5 %
Monocytes Relative: 6 %
NEUTROS ABS: 9.6 10*3/uL — AB (ref 1.7–7.7)
NEUTROS ABS: 8.8 10*3/uL — AB (ref 1.7–7.7)
NEUTROS PCT: 76 %
Neutrophils Relative %: 78 %
Platelets: 250 10*3/uL (ref 150–400)
Platelets: 247 10*3/uL (ref 150–400)
RBC: 3.99 MIL/uL (ref 3.87–5.11)
RBC: 3.86 MIL/uL — ABNORMAL LOW (ref 3.87–5.11)
RDW: 12.8 % (ref 11.5–15.5)
RDW: 12.8 % (ref 11.5–15.5)
WBC: 12.4 10*3/uL — ABNORMAL HIGH (ref 4.0–10.5)
WBC: 11.6 10*3/uL — ABNORMAL HIGH (ref 4.0–10.5)

## 2018-07-01 LAB — BASIC METABOLIC PANEL
ANION GAP: 11 (ref 5–15)
ANION GAP: 8 (ref 5–15)
BUN: 10 mg/dL (ref 6–20)
BUN: 9 mg/dL (ref 6–20)
CHLORIDE: 103 mmol/L (ref 98–111)
CO2: 24 mmol/L (ref 22–32)
CO2: 26 mmol/L (ref 22–32)
Calcium: 9.5 mg/dL (ref 8.9–10.3)
Calcium: 8.7 mg/dL — ABNORMAL LOW (ref 8.9–10.3)
Chloride: 100 mmol/L (ref 98–111)
Creatinine, Ser: 0.71 mg/dL (ref 0.44–1.00)
Creatinine, Ser: 0.68 mg/dL (ref 0.44–1.00)
GFR calc Af Amer: >60 mL/min (ref >60)
GLUCOSE: 201 mg/dL — AB (ref 70–99)
Glucose, Bld: 115 mg/dL — ABNORMAL HIGH (ref 70–99)
POTASSIUM: 3.7 mmol/L (ref 3.5–5.1)
Potassium: 3.5 mmol/L (ref 3.5–5.1)
SODIUM: 137 mmol/L (ref 135–145)
Sodium: 135 mmol/L (ref 135–145)

## 2018-07-01 LAB — I-STAT CG4 LACTIC ACID, ED: Lactic Acid, Venous: 2.03 mmol/L (ref 0.5–1.9)

## 2018-07-01 LAB — TROPONIN I: Troponin I: <0.03 ng/mL (ref <0.03)

## 2018-07-01 LAB — SEDIMENTATION RATE: Sed Rate: 90 mm/hr — ABNORMAL HIGH (ref 0–22)

## 2018-07-01 LAB — GLUCOSE, CAPILLARY: GLUCOSE-CAPILLARY: 125 mg/dL — AB (ref 70–99)

## 2018-07-01 LAB — CBG MONITORING, ED: Glucose-Capillary: 128 mg/dL — ABNORMAL HIGH (ref 70–99)

## 2018-07-01 MED ORDER — VANCOMYCIN HCL IN DEXTROSE 1-5 GM/200ML-% IV SOLN
1000.0000 mg | Freq: Three times a day (TID) | INTRAVENOUS | Status: DC
  Administered 2018-07-02 – 2018-07-03 (×5): 1000 mg via INTRAVENOUS
  Filled 2018-07-01 (×5): qty 200

## 2018-07-01 MED ORDER — SODIUM CHLORIDE 0.9 % IV BOLUS
1000.0000 mL | Freq: Once | INTRAVENOUS | Status: AC
  Administered 2018-07-01: 1000 mL via INTRAVENOUS

## 2018-07-01 MED ORDER — ONDANSETRON HCL 4 MG/2ML IJ SOLN
4.0000 mg | Freq: Once | INTRAMUSCULAR | Status: AC
  Administered 2018-07-01: 4 mg via INTRAVENOUS
  Filled 2018-07-01: qty 2

## 2018-07-01 MED ORDER — NITROGLYCERIN 0.4 MG SL SUBL: 0.4000 mg | SUBLINGUAL_TABLET | SUBLINGUAL | Status: DC | PRN

## 2018-07-01 MED ORDER — SODIUM CHLORIDE 0.9 % IV SOLN
2.0000 g | Freq: Three times a day (TID) | INTRAVENOUS | Status: DC
  Administered 2018-07-01 – 2018-07-02 (×3): 2 g via INTRAVENOUS
  Filled 2018-07-01 (×11): qty 2

## 2018-07-01 MED ORDER — VANCOMYCIN HCL IN DEXTROSE 1-5 GM/200ML-% IV SOLN
1000.0000 mg | Freq: Once | INTRAVENOUS | Status: AC
  Administered 2018-07-01: 1000 mg via INTRAVENOUS
  Filled 2018-07-01: qty 200

## 2018-07-01 MED ORDER — MORPHINE SULFATE (PF) 2 MG/ML IV SOLN
2.0000 mg | Freq: Once | INTRAVENOUS | Status: AC
  Administered 2018-07-01: 2 mg via INTRAVENOUS
  Filled 2018-07-01: qty 1

## 2018-07-01 MED ORDER — SODIUM CHLORIDE 0.9 % IV SOLN
INTRAVENOUS | Status: DC
  Administered 2018-07-01: 17:00:00 via INTRAVENOUS

## 2018-07-01 MED ORDER — METRONIDAZOLE IN NACL 5-0.79 MG/ML-% IV SOLN
500.0000 mg | Freq: Three times a day (TID) | INTRAVENOUS | Status: DC
  Administered 2018-07-01 – 2018-07-02 (×3): 500 mg via INTRAVENOUS
  Filled 2018-07-01 (×3): qty 100

## 2018-07-01 MED ORDER — INSULIN ASPART 100 UNIT/ML ~~LOC~~ SOLN
0.0000 [IU] | Freq: Three times a day (TID) | SUBCUTANEOUS | Status: DC
  Administered 2018-07-02: 3 [IU] via SUBCUTANEOUS
  Administered 2018-07-02 (×2): 5 [IU] via SUBCUTANEOUS

## 2018-07-01 MED ORDER — MORPHINE SULFATE (PF) 4 MG/ML IV SOLN
4.0000 mg | Freq: Once | INTRAVENOUS | Status: AC
  Administered 2018-07-01: 4 mg via INTRAVENOUS
  Filled 2018-07-01: qty 1

## 2018-07-01 NOTE — H&P (Signed)
History and Physical    Anne [name] PIR:[id number] DOB: Oct 20, 1981 DOA: 07/01/2018  PCP: [facility]  Patient coming from: home  Chief Complaint: foot red  HPI: Anne [name] is a 37 y.o. 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  She has been on oral antibiotics with her podiatrist including Septra doxycycline and rifampin for a month.  The infection has progressed she is got more redness to the area that is now streaking up her leg.  Imaging today shows that is progressed to osteomyelitis.  Patient is being referred for admission for diabetic foot infection.    Review of Systems: As per HPI otherwise 10 point review of systems negative.   Past Medical History:  Diagnosis Date  . Acid reflux   . Anxiety   . Arthritis   . Diabetes mellitus type 1 ([place]) 10/31/2011  . Hypercholesteremia   . Hypertension   . Renal failure   . Sciatica     Past Surgical History:  Procedure Laterality Date  . AMPUTATION Right 11/03/2011   Procedure: AMPUTATION RAY;  Surgeon: Jamesetta So;  Location: [facility];  Service: General;  Laterality: Right;  Right fourth and fifth metatarsal   . APPENDECTOMY    . CESAREAN SECTION  2004 and 2007   x2  . TUBAL LIGATION       reports that she quit smoking about 4 years ago. Her smoking use included cigarettes. She has a 5.00 pack-year smoking history. She has never used smokeless tobacco. She reports that she does not drink alcohol or use drugs.  No Known Allergies  Family History  Problem Relation Age of Onset  . Diabetes Father   . Lung cancer Father   . Alcoholism Father   . Asthma Mother   . Kidney disease Mother   . Anemia Mother        hemolytic  . Hypertension Mother   . Heart attack Paternal Grandmother   . Diabetes Paternal Grandmother   . Diabetes Paternal  Grandfather   . Anesthesia problems Neg Hx   . Hypotension Neg Hx   . Malignant hyperthermia Neg Hx   . Pseudochol deficiency Neg Hx     Prior to Admission medications   Medication Sig Start Date End Date Taking? Authorizing Provider  Blood Glucose Monitoring Suppl (ACCU-CHEK AVIVA) device Use as instructed 10/28/15   Cassandria Anger, MD  cholecalciferol (VITAMIN D) 400 UNITS TABS tablet Take 400 Units by mouth.    [provider]  doxycycline (VIBRAMYCIN) 100 MG capsule Take 1 capsule (100 mg total) by mouth 2 (two) times daily. 05/23/18   Fredia Sorrow, MD  gemfibrozil (LOPID) 600 MG tablet TAKE (1) TABLET BY MOUTH TWICE A DAY BEFORE MEALS. (BREAKFAST AND SUPPER) 02/03/16   Nida, Marella Chimes, MD  HYDROcodone-acetaminophen (NORCO) 7.5-325 MG tablet One tablet every four hours as needed for pain. 05/11/18   Sanjuana Kava, MD  LANTUS SOLOSTAR 100 UNIT/ML Solostar Pen INNJECT 50 UNITS S.Q. ONCE DAILY AT 10 P.M. Patient taking differently: INNJECT 70 UNITS S.Q. ONCE DAILY AT 10 P.M. 02/03/16   Cassandria Anger, MD  metFORMIN (GLUCOPHAGE) 500 MG tablet TAKE 1 TABLET BY MOUTH TWICE DAILY WITH MEALS. Patient taking differently: 1 tablet three times daily 02/03/16   Cassandria Anger, MD  metoCLOPramide (REGLAN) 10 MG/10ML  SOLN Take 5 mg by mouth 3 (three) times daily. 3 TO 4 TIMES DAILY for possible Gastroparesis     [provider]  naproxen sodium (ALEVE) 220 MG tablet Take 220 mg by mouth daily as needed.    [provider]  NOVOLOG FLEXPEN 100 UNIT/ML FlexPen INJECT 12-18 UNITS S.Q. THREE TIMES DAILY WITH MEALS. 04/29/16   Nida, Marella Chimes, MD  Omega-3 Fatty Acids (FISH OIL) 1000 MG CAPS Take by mouth.    [provider]  omeprazole (PRILOSEC) 20 MG capsule Take 20 mg by mouth 2 (two) times daily.    [provider]  Potassium 99 MG TABS Take 1 tablet by mouth daily.    [provider]  sertraline (ZOLOFT) 100 MG tablet  Take 100 mg by mouth daily.    [provider]  sulfamethoxazole-trimethoprim (BACTRIM DS,SEPTRA DS) 800-160 MG tablet Take 1 tablet by mouth 2 (two) times daily. 05/19/18   [provider]  valsartan (DIOVAN) 160 MG tablet Take 160 mg by mouth daily.    [provider]    Physical Exam: Vitals:   07/01/18 1523 07/01/18 1524 07/01/18 1809  BP: (!) 153/87    Pulse: (!) 117    Resp: 20    Temp: 98.2 F (36.8 C)  98.2 F (36.8 C)  TempSrc: Oral  Oral  SpO2: 99%    Weight:  93.9 kg   Height:  5\' 10"  (1.778 m)       Constitutional: NAD, calm, comfortable Vitals:   07/01/18 1523 07/01/18 1524 07/01/18 1809  BP: (!) 153/87    Pulse: (!) 117    Resp: 20    Temp: 98.2 F (36.8 C)  98.2 F (36.8 C)  TempSrc: Oral  Oral  SpO2: 99%    Weight:  93.9 kg   Height:  5\' 10"  (1.778 m)    Eyes: PERRL, lids and conjunctivae normal ENMT: Mucous membranes are moist. Posterior pharynx clear of any exudate or lesions.Normal dentition.  Neck: normal, supple, no masses, no thyromegaly Respiratory: clear to auscultation bilaterally, no wheezing, no crackles. Normal respiratory effort. No accessory muscle use.  Cardiovascular: Regular rate and rhythm, no murmurs / rubs / gallops. No extremity edema. 2+ pedal pulses. No carotid bruits.  Abdomen: no tenderness, no masses palpated. No hepatosplenomegaly. Bowel sounds positive.  Musculoskeletal: no clubbing / cyanosis. No joint deformity upper and lower extremities. Good ROM, no contractures. Normal muscle tone.  Skin: Left foot with wound with surrounding erythema and streaking up her leg consistent with cellulitis no induration or fluctuance or evidence of abscess  neurologic: CN 2-12 grossly intact. Sensation intact, DTR normal. Strength 5/5 in all 4.  Psychiatric: Normal judgment and insight. Alert and oriented x 3. Normal mood.    Labs on Admission: I have personally reviewed following labs and imaging  studies  CBC: Recent Labs  Lab 07/01/18 1546  WBC 12.4*  NEUTROABS 9.6*  HGB 12.3  HCT 35.4*  MCV 88.7  PLT 673   Basic Metabolic Panel: Recent Labs  Lab 07/01/18 1546  NA 135  K 3.7  CL 100  CO2 24  GLUCOSE 201*  BUN 10  CREATININE 0.71  CALCIUM 9.5   GFR: Estimated Creatinine Clearance: 119.6 mL/min (by C-G formula based on SCr of 0.71 mg/dL). Liver Function Tests: No results for input(s): AST, ALT, ALKPHOS, BILITOT, PROT, ALBUMIN in the last 168 hours. No results for input(s): LIPASE, AMYLASE in the last 168 hours. No results for input(s): AMMONIA  in the last 168 hours. Coagulation Profile: No results for input(s): INR, PROTIME in the last 168 hours. Cardiac Enzymes: No results for input(s): CKTOTAL, CKMB, CKMBINDEX, TROPONINI in the last 168 hours. BNP (last 3 results) No results for input(s): PROBNP in the last 8760 hours. HbA1C: No results for input(s): HGBA1C in the last 72 hours. CBG: Recent Labs  Lab 07/01/18 1811  GLUCAP 128*   Lipid Profile: No results for input(s): CHOL, HDL, LDLCALC, TRIG, CHOLHDL, LDLDIRECT in the last 72 hours. Thyroid Function Tests: No results for input(s): TSH, T4TOTAL, FREET4, T3FREE, THYROIDAB in the last 72 hours. Anemia Panel: No results for input(s): VITAMINB12, FOLATE, FERRITIN, TIBC, IRON, RETICCTPCT in the last 72 hours. Urine analysis:    Component Value Date/Time   COLORURINE YELLOW 06/26/2013 [place] 06/26/2013 1354   LABSPEC 1.015 06/26/2013 1354   PHURINE 5.5 06/26/2013 1354   GLUCOSEU NEGATIVE 06/26/2013 1354   HGBUR LARGE (A) 06/26/2013 1354   BILIRUBINUR NEGATIVE 06/26/2013 1354   BILIRUBINUR neg 06/03/2012 1536   KETONESUR NEGATIVE 06/26/2013 1354   PROTEINUR NEGATIVE 06/26/2013 1354   UROBILINOGEN 0.2 06/26/2013 1354   NITRITE NEGATIVE 06/26/2013 1354   LEUKOCYTESUR NEGATIVE 06/26/2013 1354   Sepsis Labs:  !!!!!!!!!!!!!!!!!!!!!!!!!!!!!!!!!!!!!!!!!!!! @LABRCNTIP (procalcitonin:4,lacticidven:4) ) Recent Results (from the past 240 hour(s))  Culture, blood (routine x 2)     Status: None (Preliminary result)   Collection Time: 07/01/18  3:53 PM  Result Value Ref Range Status   Specimen Description BLOOD LEFT ANTECUBITAL  Final   Special Requests   Final    BOTTLES DRAWN AEROBIC AND ANAEROBIC Blood Culture adequate volume Performed at [facility], [address] [place], [place] [address]    Culture PENDING  Incomplete   Report Status PENDING  Incomplete     Radiological Exams on Admission: Dg Foot Complete Left  Result Date: 07/01/2018 CLINICAL DATA:  Lateral foot ulcer, initial encounter EXAM: LEFT FOOT - COMPLETE 3+ VIEW COMPARISON:  05/23/2018 FINDINGS: Persistent skin ulcer is noted adjacent to the distal aspect of the fifth metatarsal. It appears slightly larger than that noted on the prior exam. Some mild lucency is noted over the head of the fifth metatarsal consistent with underlying osteomyelitis. IMPRESSION: Lucency in the head of the fifth metatarsal new from the prior exam consistent with focal osteomyelitis. Electronically Signed   By: Inez Catalina M.D.   On: 07/01/2018 16:42    EKG: Independently reviewed. ST no acute issues Old chart reviewed Case discussed with dr Gilford Raid in the ED  Assessment/Plan 37 year old diabetic female with left foot osteomyelitis and cellulitis Principal Problem:   Osteomyelitis (HCC)-MRI foot.  General surgery consultation.  IV vancomycin cefepime and Flagyl.  Vital signs stable patient not septic.  Active Problems:   Cellulitis in diabetic foot (HCC)-antibiotics as above    Type 2 diabetes mellitus with vascular disease (HCC)-sliding scale insulin    Essential hypertension, benign-clarify home meds and resume   Med rec pending pharmacy review  DVT prophylaxis: scds  Code Status: full Family Communication: none Disposition Plan:  days Consults called: gen surgery Admission status: admission   DAVID,RACHAL A MD [facility]  If 7PM-7AM, please contact night-coverage [email or web address] Password [facility]  07/01/2018, 6:25 PM

## 2018-07-01 NOTE — ED Notes (Signed)
CRITICAL VALUE ALERT  Critical Value:  2.03  Date & Time Notied:  07/01/2018  Provider Notified: Dr. Gilford Raid

## 2018-07-01 NOTE — ED Triage Notes (Signed)
Patient states she has wound to left foot that was treated here last month and she went to PCP today and was told to come to ER for IV antibiotics.

## 2018-07-01 NOTE — ED Notes (Signed)
Taken to xray.

## 2018-07-01 NOTE — Progress Notes (Signed)
Patient arrived to unit.  Alert and complaining of mid-sternal non-radiating chest pain 6/10.  Reports "feels like my heart is being squeezed.  Burning in my chest."  VS obtained.  Dr. Shanon Brow notified via text page.

## 2018-07-01 NOTE — Progress Notes (Signed)
RT notified of EKG

## 2018-07-01 NOTE — ED Provider Notes (Signed)
[facility] EMERGENCY DEPARTMENT Provider Note   CSN: [id number] Arrival date & time: 07/01/18  1509     History   Chief Complaint Chief Complaint  Patient presents with  . Wound Infection    HPI Anne [name] is a 37 y.o. female.  Pt presents to the ED today with a wound infection to her left foot.  The pt has had the wound for over a month.  She has been treated with septra, doxy, and rifampin.  The pt is followed by her podiatrist and she saw him today.  He recommended admission for IV abx as he saw evidence of osteomyelitis on xrays.  The pt said the wound was + for staph.  The pt is a diabetic with a last hgb a1c of 11.       Past Medical History:  Diagnosis Date  . Acid reflux   . Anxiety   . Arthritis   . Diabetes mellitus type 1 ([place]) 10/31/2011  . Hypercholesteremia   . Hypertension   . Renal failure   . Sciatica     Patient Active Problem List   Diagnosis Date Noted  . Osteomyelitis ([place]) 07/01/2018  . Personal history of noncompliance with medical treatment, presenting hazards to health 10/28/2015  . Multiple nevi 10/07/2012  . Toe ulcer ([place]) 03/11/2012  . Essential hypertension, benign 03/11/2012  . 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 Diabetic neuropathy ([place]) 02/11/2012  . Back pain 02/11/2012  . Muscle spasm 02/11/2012  . Hyperlipidemia 02/11/2012  . Obesity 02/11/2012  . Cellulitis in diabetic foot ([place]) 10/31/2011    Past Surgical History:  Procedure Laterality Date  . AMPUTATION Right 11/03/2011   Procedure: AMPUTATION RAY;  Surgeon: Jamesetta So;  Location: [facility];  Service: General;  Laterality: Right;  Right fourth and fifth metatarsal   . APPENDECTOMY    . CESAREAN SECTION  2004 and 2007   x2  . TUBAL LIGATION       OB History   None      Home Medications    Prior to Admission medications   Medication Sig Start Date End Date Taking? Authorizing Provider  Blood Glucose Monitoring Suppl  (ACCU-CHEK AVIVA) device Use as instructed 10/28/15   Cassandria Anger, MD  cholecalciferol (VITAMIN D) 400 UNITS TABS tablet Take 400 Units by mouth.    [provider]  doxycycline (VIBRAMYCIN) 100 MG capsule Take 1 capsule (100 mg total) by mouth 2 (two) times daily. 05/23/18   Fredia Sorrow, MD  gemfibrozil (LOPID) 600 MG tablet TAKE (1) TABLET BY MOUTH TWICE A DAY BEFORE MEALS. (BREAKFAST AND SUPPER) 02/03/16   Nida, Marella Chimes, MD  HYDROcodone-acetaminophen (NORCO) 7.5-325 MG tablet One tablet every four hours as needed for pain. 05/11/18   Sanjuana Kava, MD  LANTUS SOLOSTAR 100 UNIT/ML Solostar Pen INNJECT 50 UNITS S.Q. ONCE DAILY AT 10 P.M. Patient taking differently: INNJECT 70 UNITS S.Q. ONCE DAILY AT 10 P.M. 02/03/16   Cassandria Anger, MD  metFORMIN (GLUCOPHAGE) 500 MG tablet TAKE 1 TABLET BY MOUTH TWICE DAILY WITH MEALS. Patient taking differently: 1 tablet three times daily 02/03/16   Cassandria Anger, MD  metoCLOPramide (REGLAN) 10 MG/10ML SOLN Take 5 mg by mouth 3 (three) times daily. 3 TO 4 TIMES DAILY for possible Gastroparesis     [provider]  naproxen sodium (ALEVE) 220 MG tablet Take 220 mg by mouth daily as needed.    [provider]  NOVOLOG FLEXPEN 100 UNIT/ML FlexPen INJECT 12-18 UNITS S.Q. THREE TIMES DAILY WITH MEALS. 04/29/16   Nida, Marella Chimes, MD  Omega-3 Fatty Acids (FISH OIL) 1000 MG CAPS Take by mouth.    [provider]  omeprazole (PRILOSEC) 20 MG capsule Take 20 mg by mouth 2 (two) times daily.    [provider]  Potassium 99 MG TABS Take 1 tablet by mouth daily.    [provider]  sertraline (ZOLOFT) 100 MG tablet Take 100 mg by mouth daily.    [provider]  sulfamethoxazole-trimethoprim (BACTRIM DS,SEPTRA DS) 800-160 MG tablet Take 1 tablet by mouth 2 (two) times daily. 05/19/18   [provider]  valsartan (DIOVAN) 160 MG tablet Take 160 mg by mouth daily.     [provider]    Family History Family History  Problem Relation Age of Onset  . Diabetes Father   . Lung cancer Father   . Alcoholism Father   . Asthma Mother   . Kidney disease Mother   . Anemia Mother        hemolytic  . Hypertension Mother   . Heart attack Paternal Grandmother   . Diabetes Paternal Grandmother   . Diabetes Paternal Grandfather   . Anesthesia problems Neg Hx   . Hypotension Neg Hx   . Malignant hyperthermia Neg Hx   . Pseudochol deficiency Neg Hx     Social History Social History   Tobacco Use  . Smoking status: Former Smoker    Packs/day: 0.25    Years: 20.00    Pack years: 5.00    Types: Cigarettes    Last attempt to quit: 12/29/2013    Years since quitting: 4.5  . Smokeless tobacco: Never Used  Substance Use Topics  . Alcohol use: No  . Drug use: No     Allergies   Patient has no known allergies.   Review of Systems Review of Systems  Musculoskeletal:       Left foot pain  Skin: Positive for wound.  All other systems reviewed and are negative.    Physical Exam Updated Vital Signs BP (!) 153/87 (BP Location: Right Arm)   Pulse (!) 117   Temp 98.2 F (36.8 C) (Oral)   Resp 20   Ht 5\' 10"  (1.778 m)   Wt 93.9 kg   LMP 06/10/2018   SpO2 99%   BMI 29.70 kg/m   Physical Exam  Constitutional: She is oriented to person, place, and time. She appears well-developed and well-nourished.  HENT:  Head: Normocephalic and atraumatic.  Right Ear: External ear normal.  Left Ear: External ear normal.  Nose: Nose normal.  Mouth/Throat: Oropharynx is clear and moist.  Eyes: Pupils are equal, round, and reactive to light. Conjunctivae and EOM are normal.  Neck: Normal range of motion. Neck supple.  Cardiovascular: Regular rhythm, normal heart sounds and intact distal pulses. Tachycardia present.  Pulmonary/Chest: Effort normal and breath sounds normal.  Abdominal: Soft. Bowel sounds are normal.  Musculoskeletal:       Left  foot: There is tenderness.  Neurological: She is alert and oriented to person, place, and time.  Skin: Skin is warm. Capillary refill takes less than 2 seconds.  See pictures  Psychiatric: She has a normal mood and affect. Her behavior is normal. Judgment and thought content normal.  Nursing note and vitals reviewed.         ED Treatments / Results  Labs (all labs ordered are listed, but only  abnormal results are displayed) Labs Reviewed  BASIC METABOLIC PANEL - Abnormal; Notable for the following components:      Result Value   Glucose, Bld 201 (*)    All other components within normal limits  CBC WITH DIFFERENTIAL/PLATELET - Abnormal; Notable for the following components:   WBC 12.4 (*)    HCT 35.4 (*)    Neutro Abs 9.6 (*)    All other components within normal limits  I-STAT CG4 LACTIC ACID, ED - Abnormal; Notable for the following components:   Lactic Acid, Venous 2.03 (*)    All other components within normal limits  CULTURE, BLOOD (ROUTINE X 2)  AEROBIC CULTURE (SUPERFICIAL SPECIMEN)  PREGNANCY, URINE  I-STAT BETA HCG BLOOD, ED ([facility], [facility], [facility] ONLY)    EKG None  Radiology Dg Foot Complete Left  Result Date: 07/01/2018 CLINICAL DATA:  Lateral foot ulcer, initial encounter EXAM: LEFT FOOT - COMPLETE 3+ VIEW COMPARISON:  05/23/2018 FINDINGS: Persistent skin ulcer is noted adjacent to the distal aspect of the fifth metatarsal. It appears slightly larger than that noted on the prior exam. Some mild lucency is noted over the head of the fifth metatarsal consistent with underlying osteomyelitis. IMPRESSION: Lucency in the head of the fifth metatarsal new from the prior exam consistent with focal osteomyelitis. Electronically Signed   By: Inez Catalina M.D.   On: 07/01/2018 16:42    Procedures Procedures (including critical care time)  Medications Ordered in ED Medications  sodium chloride 0.9 % bolus 1,000 mL (0 mLs Intravenous Stopped 07/01/18 1700)    And  0.9 %  sodium  chloride infusion (has no administration in time range)  vancomycin (VANCOCIN) IVPB 1000 mg/200 mL premix (0 mg Intravenous Stopped 07/01/18 1700)  morphine 4 MG/ML injection 4 mg (4 mg Intravenous Given 07/01/18 1607)  ondansetron (ZOFRAN) injection 4 mg (4 mg Intravenous Given 07/01/18 1606)     Initial Impression / Assessment and Plan / ED Course  I have reviewed the triage vital signs and the nursing notes.  Pertinent labs & imaging results that were available during my care of the patient were reviewed by me and considered in my medical decision making (see chart for details).  Pt d/w Dr. Shanon Brow ([facility]) for admission.   Final Clinical Impressions(s) / ED Diagnoses   Final diagnoses:  Other acute osteomyelitis of left foot ([place])  Poorly controlled type 1 diabetes mellitus [facility])    ED Discharge Orders    None       Isla Pence, MD 07/01/18 1705

## 2018-07-01 NOTE — Progress Notes (Addendum)
Pharmacy Antibiotic Note  Anne [name] is a 37 y.o. female admitted on 07/01/2018 with DFI.  Pharmacy has been consulted for vancomycin and cefepime dosing.  Plan: Vancomycin 1000mg  IV every 8 hours. Second dose given closely after first(loading).  Goal trough 15-20 mcg/mL. cefepime 2gm iv q8h  Height: 5\' 10"  (177.8 cm) Weight: 207 lb 9.6 oz (94.2 kg) IBW/kg (Calculated) : 68.5  Temp (24hrs), Avg:98.4 F (36.9 C), Min:98.2 F (36.8 C), Max:98.8 F (37.1 C)  Recent Labs  Lab 07/01/18 1546 07/01/18 1602 07/01/18 1834  WBC 12.4*  --  11.6*  CREATININE 0.71  --  0.68  LATICACIDVEN  --  2.03*  --     Estimated Creatinine Clearance: 119.8 mL/min (by C-G formula based on SCr of 0.68 mg/dL).    No Known Allergies  Antimicrobials this admission: 8/9 flagyl >>  8/9 cefepime >> 8/9 vancomycin >>    Microbiology results: 8/9 BCx: ngtd 8/9 aerobic culture from left foot >> ngtd   Thank you for allowing pharmacy to be a part of this patient's care.  Donna Christen Vishal Sandlin 07/01/2018 8:23 PM

## 2018-07-02 DIAGNOSIS — M86172 Other acute osteomyelitis, left ankle and foot: Secondary | ICD-10-CM

## 2018-07-02 DIAGNOSIS — E11628 Type 2 diabetes mellitus with other skin complications: Secondary | ICD-10-CM

## 2018-07-02 DIAGNOSIS — E1065 Type 1 diabetes mellitus with hyperglycemia: Secondary | ICD-10-CM

## 2018-07-02 DIAGNOSIS — I10 Essential (primary) hypertension: Secondary | ICD-10-CM

## 2018-07-02 DIAGNOSIS — L03119 Cellulitis of unspecified part of limb: Secondary | ICD-10-CM

## 2018-07-02 LAB — GLUCOSE, CAPILLARY
GLUCOSE-CAPILLARY: 211 mg/dL — AB (ref 70–99)
GLUCOSE-CAPILLARY: 291 mg/dL — AB (ref 70–99)
Glucose-Capillary: 273 mg/dL — ABNORMAL HIGH (ref 70–99)
Glucose-Capillary: 236 mg/dL — ABNORMAL HIGH (ref 70–99)

## 2018-07-02 LAB — TROPONIN I: Troponin I: <0.03 ng/mL (ref <0.03)

## 2018-07-02 LAB — C-REACTIVE PROTEIN: CRP: 14.6 mg/dL — ABNORMAL HIGH (ref <1.0)

## 2018-07-02 LAB — HEMOGLOBIN A1C
HEMOGLOBIN A1C: 10.1 % — AB (ref 4.8–5.6)
MEAN PLASMA GLUCOSE: 243.17 mg/dL

## 2018-07-02 LAB — PREALBUMIN: Prealbumin: 12.1 mg/dL — ABNORMAL LOW (ref 18–38)

## 2018-07-02 MED ORDER — SERTRALINE HCL 50 MG PO TABS
150.0000 mg | ORAL_TABLET | Freq: Every day | ORAL | Status: DC
  Administered 2018-07-02 – 2018-07-03 (×2): 150 mg via ORAL
  Filled 2018-07-02 (×2): qty 3

## 2018-07-02 MED ORDER — INSULIN GLARGINE 100 UNIT/ML ~~LOC~~ SOLN
70.0000 [IU] | Freq: Every day | SUBCUTANEOUS | Status: DC
  Administered 2018-07-02: 70 [IU] via SUBCUTANEOUS
  Filled 2018-07-02 (×2): qty 0.7

## 2018-07-02 MED ORDER — MORPHINE SULFATE (PF) 2 MG/ML IV SOLN
2.0000 mg | INTRAVENOUS | Status: DC | PRN
  Administered 2018-07-02 – 2018-07-03 (×6): 2 mg via INTRAVENOUS
  Filled 2018-07-02 (×6): qty 1

## 2018-07-02 MED ORDER — ONDANSETRON HCL 4 MG/2ML IJ SOLN
4.0000 mg | Freq: Four times a day (QID) | INTRAMUSCULAR | Status: DC | PRN
  Administered 2018-07-02 – 2018-07-03 (×2): 4 mg via INTRAVENOUS
  Filled 2018-07-02 (×2): qty 2

## 2018-07-02 MED ORDER — METRONIDAZOLE 500 MG PO TABS: 500.0000 mg | ORAL_TABLET | Freq: Four times a day (QID) | ORAL | Status: DC

## 2018-07-02 MED ORDER — INSULIN ASPART 100 UNIT/ML ~~LOC~~ SOLN: 4.0000 [IU] | Freq: Three times a day (TID) | SUBCUTANEOUS | Status: DC

## 2018-07-02 MED ORDER — SODIUM CHLORIDE 0.9 % IV SOLN
2.0000 g | INTRAVENOUS | Status: DC
  Administered 2018-07-02 – 2018-07-03 (×2): 2 g via INTRAVENOUS
  Filled 2018-07-02 (×3): qty 20

## 2018-07-02 MED ORDER — METRONIDAZOLE 500 MG PO TABS
500.0000 mg | ORAL_TABLET | Freq: Four times a day (QID) | ORAL | Status: DC
  Administered 2018-07-02 – 2018-07-03 (×4): 500 mg via ORAL
  Filled 2018-07-02 (×4): qty 1

## 2018-07-02 MED ORDER — INSULIN ASPART 100 UNIT/ML ~~LOC~~ SOLN
0.0000 [IU] | Freq: Three times a day (TID) | SUBCUTANEOUS | Status: DC
  Administered 2018-07-03 (×3): 5 [IU] via SUBCUTANEOUS

## 2018-07-02 MED ORDER — ENOXAPARIN SODIUM 40 MG/0.4ML ~~LOC~~ SOLN
40.0000 mg | SUBCUTANEOUS | Status: DC
  Administered 2018-07-02 – 2018-07-03 (×2): 40 mg via SUBCUTANEOUS
  Filled 2018-07-02 (×2): qty 0.4

## 2018-07-02 MED ORDER — MORPHINE SULFATE (PF) 4 MG/ML IV SOLN: 4.0000 mg | INTRAVENOUS | Status: DC | PRN

## 2018-07-02 MED ORDER — IRBESARTAN 75 MG PO TABS
37.5000 mg | ORAL_TABLET | Freq: Every day | ORAL | Status: DC
  Administered 2018-07-02 – 2018-07-03 (×2): 37.5 mg via ORAL
  Filled 2018-07-02 (×2): qty 1

## 2018-07-02 MED ORDER — SILVER SULFADIAZINE 1 % EX CREA
TOPICAL_CREAM | Freq: Two times a day (BID) | CUTANEOUS | Status: DC
  Administered 2018-07-02: 15:00:00 via TOPICAL
  Administered 2018-07-02: 1 via TOPICAL
  Administered 2018-07-03: 13:00:00 via TOPICAL
  Filled 2018-07-02: qty 85

## 2018-07-02 MED ORDER — ADULT MULTIVITAMIN W/MINERALS CH
1.0000 | ORAL_TABLET | Freq: Every day | ORAL | Status: DC
Start: 2018-07-02 — End: 2018-07-03
  Administered 2018-07-02 – 2018-07-03 (×2): 1 via ORAL
  Filled 2018-07-02 (×2): qty 1

## 2018-07-02 MED ORDER — METRONIDAZOLE 500 MG PO TABS: 500.0000 mg | ORAL_TABLET | Freq: Three times a day (TID) | ORAL | Status: DC

## 2018-07-02 MED ORDER — PRO-STAT SUGAR FREE PO LIQD
30.0000 mL | Freq: Two times a day (BID) | ORAL | Status: DC
Start: 2018-07-02 — End: 2018-07-03
  Administered 2018-07-02 – 2018-07-03 (×3): 30 mL via ORAL
  Filled 2018-07-02 (×3): qty 30

## 2018-07-02 MED ORDER — INSULIN ASPART 100 UNIT/ML ~~LOC~~ SOLN
0.0000 [IU] | Freq: Every day | SUBCUTANEOUS | Status: DC
  Administered 2018-07-02: 2 [IU] via SUBCUTANEOUS

## 2018-07-02 MED ORDER — GABAPENTIN 300 MG PO CAPS
300.0000 mg | ORAL_CAPSULE | Freq: Three times a day (TID) | ORAL | Status: DC
  Administered 2018-07-02 – 2018-07-03 (×3): 300 mg via ORAL
  Filled 2018-07-02 (×3): qty 1

## 2018-07-02 NOTE — Progress Notes (Signed)
PROGRESS NOTE    Anne [name]  YKD:[id number] DOB: Apr 26, 1981 DOA: 07/01/2018 PCP: [facility]     Brief Narrative:  37 year old woman admitted from home on 8/9 at the direction of her podiatrist due to worsening diabetic foot wound.  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  She now has a wound at the base of her fifth metatarsal that has been debrided on multiple occasions by podiatry, she has been on Bactrim, doxycycline and rifampin for about 1 month and despite that had worsening.  She is directed to the emergency department for further management.   Assessment & Plan:   Principal Problem:   Osteomyelitis ([place]) Active Problems:   Cellulitis in diabetic foot ([facility])   Type 2 diabetes mellitus with vascular disease ([place])   Essential hypertension, benign   Diabetic foot wound infection -Unfortunately MRI has documented osteomyelitis of the fifth metatarsal head and neck as well as the fifth proximal phalanx. -General surgery consultation was requested for consideration of amputation.  Patient would like to avoid further amputation at all costs.  She would prefer to complete 6 weeks of antibiotics, she understands that despite 6 weeks of antibiotics she may still require amputation. -Case was discussed with Dr. Baxter Flattery with infectious diseases.  She recommends placement of a PICC line and 6 weeks of IV antibiotics consisting of vancomycin and Rocephin in addition oral Flagyl.  Stop date would be September 21.  Type 2 diabetes, uncontrolled with hyperglycemia -Continue insulin and adjust as necessary.  Hypertension -Fair control, continue home medications.   DVT prophylaxis: Lovenox Code Status: Full code Family Communication: Patient only Disposition Plan: Discharge home likely in a.m. after PICC line placement  Consultants:   Surgery  Procedures:   None  Antimicrobials:  Anti-infectives  (From admission, onward)   Start     Dose/Rate Route Frequency Ordered Stop   07/02/18 1700  metroNIDAZOLE (FLAGYL) tablet 500 mg     500 mg Oral Every 6 hours 07/02/18 1253     07/02/18 1400  metroNIDAZOLE (FLAGYL) tablet 500 mg  Status:  Discontinued     500 mg Oral Every 8 hours 07/02/18 1251 07/02/18 1252   07/02/18 1300  cefTRIAXone (ROCEPHIN) 2 g in sodium chloride 0.9 % 100 mL IVPB     2 g 200 mL/hr over 30 Minutes Intravenous Every 24 hours 07/02/18 1251     07/02/18 1300  metroNIDAZOLE (FLAGYL) tablet 500 mg  Status:  Discontinued     500 mg Oral Every 6 hours 07/02/18 1252 07/02/18 1253   07/02/18 0000  vancomycin (VANCOCIN) IVPB 1000 mg/200 mL premix     1,000 mg 200 mL/hr over 60 Minutes Intravenous Every 8 hours 07/01/18 1925     07/01/18 2000  ceFEPIme (MAXIPIME) 2 g in sodium chloride 0.9 % 100 mL IVPB  Status:  Discontinued     2 g 200 mL/hr over 30 Minutes Intravenous Every 8 hours 07/01/18 1924 07/02/18 1251   07/01/18 1930  metroNIDAZOLE (FLAGYL) IVPB 500 mg  Status:  Discontinued     500 mg 100 mL/hr over 60 Minutes Intravenous Every 8 hours 07/01/18 1829 07/02/18 1251   07/01/18 1545  vancomycin (VANCOCIN) IVPB 1000 mg/200 mL premix     1,000 mg 200 mL/hr over 60 Minutes Intravenous  Once 07/01/18 1539 07/01/18 1700       Subjective: Feels well, denies pain.  Objective: Vitals:   07/01/18 2046  07/01/18 2148 07/02/18 0603 07/02/18 1404  BP:  (!) 147/91 135/84 (!) 155/95  Pulse:  (!) 114 (!) 110 (!) 106  Resp:  20 20 19   Temp:  98.8 F (37.1 C) 98.7 F (37.1 C) 98.1 F (36.7 C)  TempSrc:  Oral Oral   SpO2: 95% 98% 99% 99%  Weight:      Height:        Intake/Output Summary (Last 24 hours) at 07/02/2018 1556 Last data filed at 07/02/2018 1402 Gross per 24 hour  Intake 2441.67 ml  Output 3000 ml  Net -558.33 ml   Filed Weights   07/01/18 1524 07/01/18 1849  Weight: 93.9 kg 94.2 kg    Examination:  General exam: Alert, awake, oriented x  3 Respiratory system: Clear to auscultation. Respiratory effort normal. Cardiovascular system:RRR. No murmurs, rubs, gallops. Gastrointestinal system: Abdomen is nondistended, soft and nontender. No organomegaly or masses felt. Normal bowel sounds heard. Central nervous system: Alert and oriented. No focal neurological deficits. Extremities: No C/C/E, +pedal pulses , Left ulcer at the head of her fifth metatarsal with surrounding erythema and edema Psychiatry: Judgement and insight appear normal. Mood & affect appropriate.     Data Reviewed: I have personally reviewed following labs and imaging studies  CBC: Recent Labs  Lab 07/01/18 1546 07/01/18 1834  WBC 12.4* 11.6*  NEUTROABS 9.6* 8.8*  HGB 12.3 11.6*  HCT 35.4* 34.3*  MCV 88.7 88.9  PLT 250 700   Basic Metabolic Panel: Recent Labs  Lab 07/01/18 1546 07/01/18 1834  NA 135 137  K 3.7 3.5  CL 100 103  CO2 24 26  GLUCOSE 201* 115*  BUN 10 9  CREATININE 0.71 0.68  CALCIUM 9.5 8.7*   GFR: Estimated Creatinine Clearance: 119.8 mL/min (by C-G formula based on SCr of 0.68 mg/dL). Liver Function Tests: No results for input(s): AST, ALT, ALKPHOS, BILITOT, PROT, ALBUMIN in the last 168 hours. No results for input(s): LIPASE, AMYLASE in the last 168 hours. No results for input(s): AMMONIA in the last 168 hours. Coagulation Profile: No results for input(s): INR, PROTIME in the last 168 hours. Cardiac Enzymes: Recent Labs  Lab 07/01/18 1834 07/02/18 0006 07/02/18 0652  TROPONINI <0.03 <0.03 <0.03   BNP (last 3 results) No results for input(s): PROBNP in the last 8760 hours. HbA1C: Recent Labs    07/01/18 1834  HGBA1C 10.1*   CBG: Recent Labs  Lab 07/01/18 1811 07/01/18 2145 07/02/18 0728 07/02/18 1159  GLUCAP 128* 125* 211* 291*   Lipid Profile: No results for input(s): CHOL, HDL, LDLCALC, TRIG, CHOLHDL, LDLDIRECT in the last 72 hours. Thyroid Function Tests: No results for input(s): TSH, T4TOTAL,  FREET4, T3FREE, THYROIDAB in the last 72 hours. Anemia Panel: No results for input(s): VITAMINB12, FOLATE, FERRITIN, TIBC, IRON, RETICCTPCT in the last 72 hours. Urine analysis:    Component Value Date/Time   COLORURINE YELLOW 06/26/2013 [place] 06/26/2013 1354   LABSPEC 1.015 06/26/2013 1354   PHURINE 5.5 06/26/2013 1354   GLUCOSEU NEGATIVE 06/26/2013 1354   HGBUR LARGE (A) 06/26/2013 1354   BILIRUBINUR NEGATIVE 06/26/2013 1354   BILIRUBINUR neg 06/03/2012 1536   KETONESUR NEGATIVE 06/26/2013 1354   PROTEINUR NEGATIVE 06/26/2013 1354   UROBILINOGEN 0.2 06/26/2013 1354   NITRITE NEGATIVE 06/26/2013 1354   LEUKOCYTESUR NEGATIVE 06/26/2013 1354   Sepsis Labs: @LABRCNTIP (procalcitonin:4,lacticidven:4)  ) Recent Results (from the past 240 hour(s))  Culture, blood (routine x 2)     Status: None (Preliminary result)  Collection Time: 07/01/18  3:53 PM  Result Value Ref Range Status   Specimen Description BLOOD LEFT ANTECUBITAL  Final   Special Requests   Final    BOTTLES DRAWN AEROBIC AND ANAEROBIC Blood Culture adequate volume   Culture   Final    NO GROWTH < 24 HOURS Performed at [facility], [address], [place], [place] [address]    Report Status PENDING  Incomplete  Blood Cultures x 2 sites     Status: None (Preliminary result)   Collection Time: 07/01/18  6:33 PM  Result Value Ref Range Status   Specimen Description BLOOD LEFT ARM  Final   Special Requests   Final    BOTTLES DRAWN AEROBIC AND ANAEROBIC Blood Culture adequate volume   Culture   Final    NO GROWTH < 12 HOURS Performed at [facility], [address] [place], [place] [address]    Report Status PENDING  Incomplete         Radiology Studies: Mr Foot Left Wo Contrast  Result Date: 07/01/2018 CLINICAL DATA:  37 year old female diabetic with nonhealing left foot wound along the lateral aspect of the left foot. EXAM: MRI OF THE LEFT FOOT WITHOUT CONTRAST TECHNIQUE: Multiplanar,  multisequence MR imaging of the left forefoot was performed. No intravenous contrast was administered. COMPARISON:  8919 foot radiographs FINDINGS: The study is slightly compromised by motion artifacts. Bones/Joint/Cartilage Subtle cortical bone loss with marrow edema involving the left fifth metatarsal head and neck with associated soft tissue swelling and ulceration along the lateral aspect of the forefoot adjacent to the fifth metatarsal. Marrow edema extends more proximally to the mid-diaphysis of the fifth metatarsal shaft. Marrow edema is also noted of the fifth proximal phalanx to the level of the phalangeal neck. The included midfoot and first through fourth rays are intact without marrow signal abnormality. A bone island is noted at the base of the fifth metatarsal. Ligaments Noncontributory Muscles and Tendons Diffuse intramuscular edema consistent with myositis. The tendons crossing the forefoot are of normal signal intensity morphology without rupture or tenosynovitis. Soft tissues Diffuse soft tissue swelling of the forefoot more localized adjacent to the fifth metatarsal head and neck with soft tissue ulceration seen at this level. IMPRESSION: Soft tissue ulceration along the lateral aspect of the forefoot adjacent to the fifth metatarsal head and neck. Marrow signal abnormality with subtle cortical bone loss of the fifth metatarsal head and neck and marrow edema of the fifth proximal phalanx are in keeping with acute osteomyelitis. Electronically Signed   By: Ashley Royalty M.D.   On: 07/01/2018 20:20   Dg Foot Complete Left  Result Date: 07/01/2018 CLINICAL DATA:  Lateral foot ulcer, initial encounter EXAM: LEFT FOOT - COMPLETE 3+ VIEW COMPARISON:  05/23/2018 FINDINGS: Persistent skin ulcer is noted adjacent to the distal aspect of the fifth metatarsal. It appears slightly larger than that noted on the prior exam. Some mild lucency is noted over the head of the fifth metatarsal consistent with  underlying osteomyelitis. IMPRESSION: Lucency in the head of the fifth metatarsal new from the prior exam consistent with focal osteomyelitis. Electronically Signed   By: Inez Catalina M.D.   On: 07/01/2018 16:42        Scheduled Meds: . enoxaparin (LOVENOX) injection  40 mg Subcutaneous Q24H  . feeding supplement (PRO-STAT SUGAR FREE 64)  30 mL Oral BID  . insulin aspart  0-9 Units Subcutaneous TID WC  . metroNIDAZOLE  500 mg Oral Q6H  . multivitamin  with minerals  1 tablet Oral Daily  . silver sulfADIAZINE   Topical BID   Continuous Infusions: . cefTRIAXone (ROCEPHIN)  IV Stopped (07/02/18 1511)  . vancomycin 1,000 mg (07/02/18 1555)     LOS: 1 day    Time spent: 35 minutes. Greater than 50% of this time was spent in direct contact with the patient, coordinating care and discussing relevant ongoing clinical issues, including discussing plan of care for her osteomyelitis and diabetic foot wound infection including consultation with ID to determine antibiotic regimen for long-term treatment of osteomyelitis.     Lelon Frohlich, MD [facility] Pager [phone]  If 7PM-7AM, please contact night-coverage [email or web address] Password [facility] 07/02/2018, 3:56 PM

## 2018-07-02 NOTE — Progress Notes (Signed)
Initial Nutrition Assessment  DOCUMENTATION CODES:  Obesity unspecified  INTERVENTION:  Will order 30 mL Prostat BID, each supplement provides 100 kcal and 15 grams of protein.  MVI with minerals  Will follow up and attempt to re-educate on DM diet in following week.   If discharges prior to being seen by RD inpatient and does not have f/u scheduled with [facility], highly recommend referral to OP dietitian for diabetic counseling.   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 Has had L to left foot for >1 month. Has been resistant to outpatient ABx. Presented after local podiatrist noted evidence of osteomyelitis on xrays and referred to ED for IV abx.   RD operating remotely on weekends.  Patient currently eating 100% of meals. No reports of her weight or intake being affected as a result of wound.   With A1C in Feb of 11.9, her poor diabetic control appears to be largest barrier to wound healing. Pt noted to be a type 1 diabetic in H&p, however, per care everywhere, patient has been followed by [facility] for DM2.   At her last outpatient visit (01/04/18), pt reported at that time that the most difficult thing about managing her DM was her "mental state".She is documented as having a hx of noncompliance with medication/insulins and poor-fair adherence to diabetic diet- noted she was eating only 1 meal/day and had poor knowledge of DM2 which was reviewed during that encounter.   Weight wise, she was 203 lbs at aforementioned encounter. She is now  207 lbs. Wt appears to have been stable at 202-208 x 7 months. Long term, she was 210-225 lbs 2014-2017. Prior to that, she was roughly in the same weight range she is in now. No major changes.   Will order prostat BID and mvi w/ min. Will follow up next week to re-educate, though she does have a history of diet non adherence. Highly recommend referral to OP dietitian for Diabetic counseling if discharges prior.   Labs: BG: 125-211, WBC:11.6, New a1c pending Meds: Insulin, IV abx  Recent Labs  Lab 07/01/18 1546 07/01/18 1834  NA 135 137  K 3.7 3.5  CL 100 103  CO2 24 26  BUN 10 9  CREATININE 0.71 0.68  CALCIUM 9.5 8.7*  GLUCOSE 201* 115*   NUTRITION - FOCUSED PHYSICAL EXAM: Unable to conduct  Diet Order:   Diet Order            Diet Carb Modified Fluid consistency: Thin; Room service appropriate? Yes  Diet effective now             EDUCATION NEEDS:  Not appropriate for education at this time  Skin: Diabetic Ulcer to L lateral Foot  Last BM:  06/30/2018  Height:  Ht Readings from Last 1 Encounters:  07/01/18 5\' 10"  (1.778 m)   Weight:  Wt Readings from Last 1 Encounters:  07/01/18 94.2 kg   Wt Readings from Last 10 Encounters:  07/01/18 94.2 kg  05/23/18 93 kg  04/20/18 95.7 kg  01/19/18 92.1 kg  12/21/17 92.5 kg  12/06/17 92.6 kg  10/20/17 95.7 kg  08/26/17 97.5 kg  08/17/17 97.5 kg  06/10/17 99.8 kg   Ideal Body Weight:  68.18 kg  BMI:  Body mass index is 29.79 kg/m.  Estimated Nutritional Needs:  Kcal:  1900-2050 (20-22 kcal/kg bw) Protein:  89-102 g Pro (1.3-1.5 g/kg ibw) Fluid:  1.9-2.1 L fluid (76ml/kcal)  Burtis Junes RD, LDN, CNSC Clinical Nutrition Available Tues-Sat via Pager: [phone] 07/02/2018 10:04 AM

## 2018-07-02 NOTE — Progress Notes (Signed)
ANTICOAGULATION CONSULT NOTE - Initial Consult  Pharmacy Consult for lovenox Indication: VTE prophylaxis  No Known Allergies  Patient Measurements: Height: 5\' 10"  (177.8 cm) Weight: 207 lb 9.6 oz (94.2 kg) IBW/kg (Calculated) : 68.5  Vital Signs: Temp: 98.7 F (37.1 C) (08/10 0603) Temp Source: Oral (08/10 0603) BP: 135/84 (08/10 0603) Pulse Rate: 110 (08/10 0603)  Labs: Recent Labs    07/01/18 1546 07/01/18 1834 07/02/18 0006 07/02/18 0652  HGB 12.3 11.6*  --   --   HCT 35.4* 34.3*  --   --   PLT 250 247  --   --   CREATININE 0.71 0.68  --   --   TROPONINI  --  <0.03 <0.03 <0.03    Estimated Creatinine Clearance: 119.8 mL/min (by C-G formula based on SCr of 0.68 mg/dL).   Medical History: Past Medical History:  Diagnosis Date  . Acid reflux   . Anxiety   . Arthritis   . Diabetes mellitus type 1 ([place]) 10/31/2011  . Hypercholesteremia   . Hypertension   . Renal failure   . Sciatica     Medications:  Facility-Administered Medications Prior to Admission  Medication Dose Route Frequency Provider Last Rate Last Dose  . 0.9 %  sodium chloride infusion  500 mL Intravenous Once Armbruster, Carlota Raspberry, MD       Medications Prior to Admission  Medication Sig Dispense Refill Last Dose  . Exenatide ER (BYDUREON) 2 MG SRER Inject 2 mg into the skin once a week.   Past Week at Unknown time  . fluconazole (DIFLUCAN) 150 MG tablet Take 150 mg by mouth See admin instructions. Take one tablet, if symptoms not resolved, may repeat in several days as directed   07/01/2018 at Unknown time  . gabapentin (NEURONTIN) 300 MG capsule Take 300 mg by mouth 3 (three) times daily as needed.   unknown  . hydrOXYzine (ATARAX/VISTARIL) 50 MG tablet Take 50 mg by mouth every 6 (six) hours as needed.   unknown  . LANTUS SOLOSTAR 100 UNIT/ML Solostar Pen INNJECT 50 UNITS S.Q. ONCE DAILY AT 10 P.M. (Patient taking differently: Inject 70 Units into the skin at bedtime. ) 15 mL 2 06/30/2018 at Unknown  time  . metFORMIN (GLUCOPHAGE) 500 MG tablet TAKE 1 TABLET BY MOUTH TWICE DAILY WITH MEALS. (Patient taking differently: Take 500 mg by mouth 4 (four) times daily. ) 60 tablet 2 07/01/2018 at Unknown time  . metoCLOPramide (REGLAN) 10 MG/10ML SOLN Take 10 mg by mouth 3 (three) times daily. 3 TO 4 TIMES DAILY for possible Gastroparesis    07/01/2018 at Unknown time  . naproxen sodium (ALEVE) 220 MG tablet Take 220 mg by mouth daily as needed.   unknown  . NOVOLOG FLEXPEN 100 UNIT/ML FlexPen INJECT 12-18 UNITS S.Q. THREE TIMES DAILY WITH MEALS. (Patient taking differently: Inject 12-18 Units into the skin See admin instructions. Sliding Scale instructions: 100-150= 12 units 151-200= 14 units 201-250= 16 units 251-300= 18 units >300= 20 units) 15 mL 3 07/01/2018 at Unknown time  . sertraline (ZOLOFT) 100 MG tablet Take 150 mg by mouth daily.    07/01/2018 at Unknown time  . valsartan (DIOVAN) 160 MG tablet Take 160 mg by mouth daily.   07/01/2018 at Unknown time  . Blood Glucose Monitoring Suppl (ACCU-CHEK AVIVA) device Use as instructed 1 each 0 Taking   Scheduled:  . enoxaparin (LOVENOX) injection  40 mg Subcutaneous Q24H  . insulin aspart  0-9 Units Subcutaneous TID WC  Infusions:  . ceFEPime (MAXIPIME) IV Stopped (07/02/18 0526)  . metronidazole Stopped (07/02/18 0447)  . vancomycin 1,000 mg (07/02/18 0812)   PRN: morphine injection, nitroGLYCERIN Anti-infectives (From admission, onward)   Start     Dose/Rate Route Frequency Ordered Stop   07/02/18 0000  vancomycin (VANCOCIN) IVPB 1000 mg/200 mL premix     1,000 mg 200 mL/hr over 60 Minutes Intravenous Every 8 hours 07/01/18 1925     07/01/18 2000  ceFEPIme (MAXIPIME) 2 g in sodium chloride 0.9 % 100 mL IVPB     2 g 200 mL/hr over 30 Minutes Intravenous Every 8 hours 07/01/18 1924     07/01/18 1930  metroNIDAZOLE (FLAGYL) IVPB 500 mg     500 mg 100 mL/hr over 60 Minutes Intravenous Every 8 hours 07/01/18 1829     07/01/18 1545  vancomycin  (VANCOCIN) IVPB 1000 mg/200 mL premix     1,000 mg 200 mL/hr over 60 Minutes Intravenous  Once 07/01/18 1539 07/01/18 1700      Assessment: 37 year old patient with diabetic foot infection requiring VTE prophylaxis with enoxaparin.   Goal of Therapy:  vte prophylaxis  Monitor platelets by anticoagulation protocol: Yes   Plan:  Lovenox 40mg  subq q24h   Donna Christen Fitzroy Mikami 07/02/2018,8:16 AM

## 2018-07-02 NOTE — Progress Notes (Signed)
RN to call [facility] for PICC placement.

## 2018-07-02 NOTE — Consult Note (Addendum)
[facility] Consult  Reason for Consult: Diabetic foot ulcer/ osteomyelitis  Referring Physician: Dr. Jerilee Hoh   Chief Complaint    Wound Infection      Anne [name] is a 37 y.o. female.  HPI: [name] is a 37 yo with self reported Type II DM since the age of 9. She has been followed by Dr. Barkley Bruns,  Podiatry regarding her diabetic foot wounds for years. She has an ulcerated area on her left lateral foot at the level of the 5th proximal phalanx / metatarsal head and this has been more swollen and red in the last week. She has been getting local debridement and oral antibiotic per report with Dr. Barkley Bruns for several weeks leading up to the last week when she noticed this worsening infection and went to his office on Friday.  He wanted to order a MRI, but given the extent of the infection he recommended she go to the ED to get evaluated for IV antibiotics and MRI.    She says that she ran out of her medications for DM for a while and her last outpatient Hgb A1C was 11 (10.1 on 8/9 in Epic).  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  She is very afraid of having more amputations due to this history. She has already spoken with Dr. Barkley Bruns regarding the MRI results and the options / plan of care through him and he has offered her IV antibiotics for 6 weeks or oral antibiotics for 12 weeks and debridement as needed and possible amputation if these maneuvers do not improve.    She reports she has had ultrasounds ? Of her legs and that her blood flow is good, but I do not see any recent ones in this system. She denies seeing Vascular surgery for any evaluation or workup in the past.     Past Medical History:  Diagnosis Date  . Acid reflux   . Anxiety   . Arthritis   . Diabetes mellitus type 1 ([place]) 10/31/2011   . Hypercholesteremia   . Hypertension   . Renal failure   . Sciatica     Past Surgical History:  Procedure Laterality Date  . AMPUTATION Right 11/03/2011   Procedure: AMPUTATION RAY;  Surgeon: Jamesetta So;  Location: [facility];  Service: General;  Laterality: Right;  Right fourth and fifth metatarsal   . APPENDECTOMY    . CESAREAN SECTION  2004 and 2007   x2  . TUBAL LIGATION      Family History  Problem Relation Age of Onset  . Diabetes Father   . Lung cancer Father   . Alcoholism Father   . Asthma Mother   . Kidney disease Mother   . Anemia Mother        hemolytic  . Hypertension Mother   . Heart attack Paternal Grandmother   . Diabetes Paternal Grandmother   . Diabetes Paternal Grandfather   . Anesthesia problems Neg Hx   . Hypotension Neg Hx   . Malignant hyperthermia Neg Hx   . Pseudochol deficiency Neg Hx     Social History   Tobacco Use  . Smoking status: Former Smoker    Packs/day: 0.25    Years: 20.00    Pack years: 5.00    Types: Cigarettes  Last attempt to quit: 12/29/2013    Years since quitting: 4.5  . Smokeless tobacco: Never Used  Substance Use Topics  . Alcohol use: No  . Drug use: No    Medications:  I have reviewed the patient's current medications. Prior to Admission:  Facility-Administered Medications Prior to Admission  Medication Dose Route Frequency Provider Last Rate Last Dose  . 0.9 %  sodium chloride infusion  500 mL Intravenous Once Armbruster, Carlota Raspberry, MD       Medications Prior to Admission  Medication Sig Dispense Refill Last Dose  . Exenatide ER (BYDUREON) 2 MG SRER Inject 2 mg into the skin once a week.   Past Week at Unknown time  . fluconazole (DIFLUCAN) 150 MG tablet Take 150 mg by mouth See admin instructions. Take one tablet, if symptoms not resolved, may repeat in several days as directed   07/01/2018 at Unknown time  . gabapentin (NEURONTIN) 300 MG capsule Take 300 mg by mouth 3 (three) times daily as needed.    unknown  . hydrOXYzine (ATARAX/VISTARIL) 50 MG tablet Take 50 mg by mouth every 6 (six) hours as needed.   unknown  . LANTUS SOLOSTAR 100 UNIT/ML Solostar Pen INNJECT 50 UNITS S.Q. ONCE DAILY AT 10 P.M. (Patient taking differently: Inject 70 Units into the skin at bedtime. ) 15 mL 2 06/30/2018 at Unknown time  . metFORMIN (GLUCOPHAGE) 500 MG tablet TAKE 1 TABLET BY MOUTH TWICE DAILY WITH MEALS. (Patient taking differently: Take 500 mg by mouth 4 (four) times daily. ) 60 tablet 2 07/01/2018 at Unknown time  . metoCLOPramide (REGLAN) 10 MG/10ML SOLN Take 10 mg by mouth 3 (three) times daily. 3 TO 4 TIMES DAILY for possible Gastroparesis    07/01/2018 at Unknown time  . naproxen sodium (ALEVE) 220 MG tablet Take 220 mg by mouth daily as needed.   unknown  . NOVOLOG FLEXPEN 100 UNIT/ML FlexPen INJECT 12-18 UNITS S.Q. THREE TIMES DAILY WITH MEALS. (Patient taking differently: Inject 12-18 Units into the skin See admin instructions. Sliding Scale instructions: 100-150= 12 units 151-200= 14 units 201-250= 16 units 251-300= 18 units >300= 20 units) 15 mL 3 07/01/2018 at Unknown time  . sertraline (ZOLOFT) 100 MG tablet Take 150 mg by mouth daily.    07/01/2018 at Unknown time  . valsartan (DIOVAN) 160 MG tablet Take 160 mg by mouth daily.   07/01/2018 at Unknown time  . Blood Glucose Monitoring Suppl (ACCU-CHEK AVIVA) device Use as instructed 1 each 0 Taking   Scheduled: . enoxaparin (LOVENOX) injection  40 mg Subcutaneous Q24H  . feeding supplement (PRO-STAT SUGAR FREE 64)  30 mL Oral BID  . insulin aspart  0-9 Units Subcutaneous TID WC  . metroNIDAZOLE  500 mg Oral Q6H  . multivitamin with minerals  1 tablet Oral Daily  . silver sulfADIAZINE   Topical BID   Continuous: . cefTRIAXone (ROCEPHIN)  IV    . vancomycin Stopped (07/02/18 0901)   XKG:YJEHUDJS injection, nitroGLYCERIN  No Known Allergies   ROS:  A comprehensive review of systems was negative except for: Musculoskeletal: positive for left foot  infetion/ pain and redness moving up leg  Blood pressure 135/84, pulse (!) 110, temperature 98.7 F (37.1 C), temperature source Oral, resp. rate 20, height 5' 10"  (1.778 m), weight 94.2 kg, last menstrual period 06/10/2018, SpO2 99 %. Physical Exam  Constitutional: She is oriented to person, place, and time. She appears well-developed.  HENT:  Head: Normocephalic and atraumatic.  Eyes: Pupils are  equal, round, and reactive to light.  Neck: Normal range of motion.  Cardiovascular: Normal rate.  Pulses:      Dorsalis pedis pulses are 1+ on the left side.       Posterior tibial pulses are 1+ on the left side.  Pulmonary/Chest: Effort normal.  Abdominal: Soft. She exhibits no distension. There is no tenderness.  Musculoskeletal: Normal range of motion.  Right foot with transmetatarsal amputation; left lateral foot at the proximal phalanx/ metatarsal head ulceration with black eschar measuring 1cm, no purulence or drainage expressed, surrounding granulation extending distally  Neurological: She is alert and oriented to person, place, and time.  Skin: Skin is warm and dry.  Psychiatric: She has a normal mood and affect. Her behavior is normal. Judgment and thought content normal.  Vitals reviewed.   Results: Results for orders placed or performed during the hospital encounter of 07/01/18 (from the past 48 hour(s))  Basic metabolic panel     Status: Abnormal   Collection Time: 07/01/18  3:46 PM  Result Value Ref Range   Sodium 135 135 - 145 mmol/L   Potassium 3.7 3.5 - 5.1 mmol/L   Chloride 100 98 - 111 mmol/L   CO2 24 22 - 32 mmol/L   Glucose, Bld 201 (H) 70 - 99 mg/dL   BUN 10 6 - 20 mg/dL   Creatinine, Ser 0.71 0.44 - 1.00 mg/dL   Calcium 9.5 8.9 - 10.3 mg/dL   GFR calc non Af Amer >60 >60 mL/min   GFR calc Af Amer >60 >60 mL/min    Comment: (NOTE) The eGFR has been calculated using the CKD EPI equation. This calculation has not been validated in all clinical situations. eGFR's  persistently <60 mL/min signify possible Chronic Kidney Disease.    Anion gap 11 5 - 15    Comment: Performed at [facility], [address] [place], [place] [address]  CBC with Differential/Platelet     Status: Abnormal   Collection Time: 07/01/18  3:46 PM  Result Value Ref Range   WBC 12.4 (H) 4.0 - 10.5 K/uL   RBC 3.99 3.87 - 5.11 MIL/uL   Hemoglobin 12.3 12.0 - 15.0 g/dL   HCT 35.4 (L) 36.0 - 46.0 %   MCV 88.7 78.0 - 100.0 fL   MCH 30.8 26.0 - 34.0 pg   MCHC 34.7 30.0 - 36.0 g/dL   RDW 12.8 11.5 - 15.5 %   Platelets 250 150 - 400 K/uL   Neutrophils Relative % 78 %   Neutro Abs 9.6 (H) 1.7 - 7.7 K/uL   Lymphocytes Relative 15 %   Lymphs Abs 1.9 0.7 - 4.0 K/uL   Monocytes Relative 5 %   Monocytes Absolute 0.6 0.1 - 1.0 K/uL   Eosinophils Relative 2 %   Eosinophils Absolute 0.2 0.0 - 0.7 K/uL   Basophils Relative 0 %   Basophils Absolute 0.0 0.0 - 0.1 K/uL    Comment: Performed at [facility], [address], [place], [place] [address]  Culture, blood (routine x 2)     Status: None (Preliminary result)   Collection Time: 07/01/18  3:53 PM  Result Value Ref Range   Specimen Description BLOOD LEFT ANTECUBITAL    Special Requests      BOTTLES DRAWN AEROBIC AND ANAEROBIC Blood Culture adequate volume   Culture      NO GROWTH < 24 HOURS Performed at [facility], [address] [place], [place] [address]    Report Status PENDING  I-Stat CG4 Lactic Acid, ED     Status: Abnormal   Collection Time: 07/01/18  4:02 PM  Result Value Ref Range   Lactic Acid, Venous 2.03 (HH) 0.5 - 1.9 mmol/L   Comment NOTIFIED PHYSICIAN   POC CBG, ED     Status: Abnormal   Collection Time: 07/01/18  6:11 PM  Result Value Ref Range   Glucose-Capillary 128 (H) 70 - 99 mg/dL  Blood Cultures x 2 sites     Status: None (Preliminary result)   Collection Time: 07/01/18  6:33 PM  Result Value Ref Range   Specimen Description BLOOD LEFT ARM    Special Requests      BOTTLES DRAWN AEROBIC AND  ANAEROBIC Blood Culture adequate volume   Culture      NO GROWTH < 12 HOURS Performed at [facility], [address], [place], [place] [address]    Report Status PENDING   Basic metabolic panel     Status: Abnormal   Collection Time: 07/01/18  6:34 PM  Result Value Ref Range   Sodium 137 135 - 145 mmol/L   Potassium 3.5 3.5 - 5.1 mmol/L   Chloride 103 98 - 111 mmol/L   CO2 26 22 - 32 mmol/L   Glucose, Bld 115 (H) 70 - 99 mg/dL   BUN 9 6 - 20 mg/dL   Creatinine, Ser 0.68 0.44 - 1.00 mg/dL   Calcium 8.7 (L) 8.9 - 10.3 mg/dL   GFR calc non Af Amer >60 >60 mL/min   GFR calc Af Amer >60 >60 mL/min    Comment: (NOTE) The eGFR has been calculated using the CKD EPI equation. This calculation has not been validated in all clinical situations. eGFR's persistently <60 mL/min signify possible Chronic Kidney Disease.    Anion gap 8 5 - 15    Comment: Performed at [facility], [address] [place], [place] [address]  CBC with Differential     Status: Abnormal   Collection Time: 07/01/18  6:34 PM  Result Value Ref Range   WBC 11.6 (H) 4.0 - 10.5 K/uL   RBC 3.86 (L) 3.87 - 5.11 MIL/uL   Hemoglobin 11.6 (L) 12.0 - 15.0 g/dL   HCT 34.3 (L) 36.0 - 46.0 %   MCV 88.9 78.0 - 100.0 fL   MCH 30.1 26.0 - 34.0 pg   MCHC 33.8 30.0 - 36.0 g/dL   RDW 12.8 11.5 - 15.5 %   Platelets 247 150 - 400 K/uL   Neutrophils Relative % 76 %   Neutro Abs 8.8 (H) 1.7 - 7.7 K/uL   Lymphocytes Relative 16 %   Lymphs Abs 1.8 0.7 - 4.0 K/uL   Monocytes Relative 6 %   Monocytes Absolute 0.7 0.1 - 1.0 K/uL   Eosinophils Relative 2 %   Eosinophils Absolute 0.2 0.0 - 0.7 K/uL   Basophils Relative 0 %   Basophils Absolute 0.0 0.0 - 0.1 K/uL    Comment: Performed at [facility], [address], [place], [place] [address]  Hemoglobin A1c     Status: Abnormal   Collection Time: 07/01/18  6:34 PM  Result Value Ref Range   Hgb A1c MFr Bld 10.1 (H) 4.8 - 5.6 %    Comment: (NOTE) Pre diabetes:           5.7%-6.4% Diabetes:              >6.4% Glycemic control for   <7.0% adults with diabetes    Mean Plasma Glucose 243.17  mg/dL    Comment: Performed at [facility], [place] [address] [place], [place] [address]  Sedimentation rate     Status: Abnormal   Collection Time: 07/01/18  6:34 PM  Result Value Ref Range   Sed Rate 90 (H) 0 - 22 mm/hr    Comment: Performed at [facility], [address], [place], [place] [address]  C-reactive protein     Status: Abnormal   Collection Time: 07/01/18  6:34 PM  Result Value Ref Range   CRP 14.6 (H) <1.0 mg/dL    Comment: Performed at [facility], [place] [address] [place], [place] [address]  Prealbumin     Status: Abnormal   Collection Time: 07/01/18  6:34 PM  Result Value Ref Range   Prealbumin 12.1 (L) 18 - 38 mg/dL    Comment: Performed at [place] [address] [place], [place] [address]  Troponin I     Status: None   Collection Time: 07/01/18  6:34 PM  Result Value Ref Range   Troponin I <0.03 <0.03 ng/mL    Comment: Performed at [facility], [address], [place], [place] [address]  Glucose, capillary     Status: Abnormal   Collection Time: 07/01/18  9:45 PM  Result Value Ref Range   Glucose-Capillary 125 (H) 70 - 99 mg/dL   Comment 1 Notify RN    Comment 2 Document in Chart   Troponin I (q 6hr x 3)     Status: None   Collection Time: 07/02/18 12:06 AM  Result Value Ref Range   Troponin I <0.03 <0.03 ng/mL    Comment: Performed at [facility], [address], [place], [place] [address]  Troponin I (q 6hr x 3)     Status: None   Collection Time: 07/02/18  6:52 AM  Result Value Ref Range   Troponin I <0.03 <0.03 ng/mL    Comment: Performed at [facility], [address] [place], [place] [address]  Glucose, capillary     Status: Abnormal   Collection Time: 07/02/18  7:28 AM  Result Value Ref Range   Glucose-Capillary 211 (H) 70 - 99 mg/dL   Comment 1 Notify RN    Comment 2 Document in Chart   Glucose,  capillary     Status: Abnormal   Collection Time: 07/02/18 11:59 AM  Result Value Ref Range   Glucose-Capillary 291 (H) 70 - 99 mg/dL   Comment 1 Notify RN    Comment 2 Document in Chart    Personally reviewed MRI- cortical changes in metatarsal head and phalanx/ no drainable collection  Mr Foot Left Wo Contrast  Result Date: 07/01/2018 CLINICAL DATA:  37 year old female diabetic with nonhealing left foot wound along the lateral aspect of the left foot. EXAM: MRI OF THE LEFT FOOT WITHOUT CONTRAST TECHNIQUE: Multiplanar, multisequence MR imaging of the left forefoot was performed. No intravenous contrast was administered. COMPARISON:  8919 foot radiographs FINDINGS: The study is slightly compromised by motion artifacts. Bones/Joint/Cartilage Subtle cortical bone loss with marrow edema involving the left fifth metatarsal head and neck with associated soft tissue swelling and ulceration along the lateral aspect of the forefoot adjacent to the fifth metatarsal. Marrow edema extends more proximally to the mid-diaphysis of the fifth metatarsal shaft. Marrow edema is also noted of the fifth proximal phalanx to the level of the phalangeal neck. The included midfoot and first through fourth rays are intact without marrow signal abnormality. A bone island is  noted at the base of the fifth metatarsal. Ligaments Noncontributory Muscles and Tendons Diffuse intramuscular edema consistent with myositis. The tendons crossing the forefoot are of normal signal intensity morphology without rupture or tenosynovitis. Soft tissues Diffuse soft tissue swelling of the forefoot more localized adjacent to the fifth metatarsal head and neck with soft tissue ulceration seen at this level. IMPRESSION: Soft tissue ulceration along the lateral aspect of the forefoot adjacent to the fifth metatarsal head and neck. Marrow signal abnormality with subtle cortical bone loss of the fifth metatarsal head and neck and marrow edema of the fifth  proximal phalanx are in keeping with acute osteomyelitis. Electronically Signed   By: Ashley Royalty M.D.   On: 07/01/2018 20:20   Dg Foot Complete Left  Result Date: 07/01/2018 CLINICAL DATA:  Lateral foot ulcer, initial encounter EXAM: LEFT FOOT - COMPLETE 3+ VIEW COMPARISON:  05/23/2018 FINDINGS: Persistent skin ulcer is noted adjacent to the distal aspect of the fifth metatarsal. It appears slightly larger than that noted on the prior exam. Some mild lucency is noted over the head of the fifth metatarsal consistent with underlying osteomyelitis. IMPRESSION: Lucency in the head of the fifth metatarsal new from the prior exam consistent with focal osteomyelitis. Electronically Signed   By: Inez Catalina M.D.   On: 07/01/2018 16:42     Assessment & Plan:  JAYLEENA STILLE is a 37 y.o. female with a diabetic foot infection of the 5th toe on the left with osteomyelitis. The patient has already decided she is not amenable to amputation at this time as she is afraid of the sequela and possible need for more amputation on that foot pending healing issues.  She wants to try IV antibiotics and debridement. She has been in discussion with Dr. Barkley Bruns regarding this prior to this admission and was advised to come to the ED by Dr. Barkley Bruns. We discussed the options, and discussed that IV would be the recommended route for osteomyelitis. We discussed the possibility of amputation and needing amputation despite the antibiotics esp if the infection is spreading.  We discussed that her pulses are palpable but that if she is having issues healing or continues to have problems with foot infections that obtaining more information with a CTA or referral to Vascular surgery would at least verify that she has good flow and does not need any interventions to help flow.   -PICC for IV antibiotics/ HH = Dr. Barkley Bruns PCP will need to follow  -Silvadene BID to the left lateral ulceration and dampened saline gauze over the silvadene   -Dr. Barkley Bruns can follow as outpatient and continue with the local debridements/ plans for amputation if fails IV antiboitics  -Vascular referral as outpatient as needed/ Dr. Barkley Bruns can refer for this if /when she needs it based on her progress, etc / At this time palpable pulses on the left foot  -No surgery planned, will sign off   All questions were answered to the satisfaction of the patient. Discussed with Dr. Jerilee Hoh.    Virl Cagey 07/02/2018, 12:52 PM

## 2018-07-02 NOTE — Progress Notes (Signed)
PHARMACY CONSULT NOTE FOR:  OUTPATIENT  PARENTERAL ANTIBIOTIC THERAPY (OPAT)  Indication: osteomyelitis Regimen: vancomycin 1500mg  iv q12h (changed from 1gm q8h at the preference of max interval from Dr Jerilee Hoh), ceftriaxone 2gm iv q24h, metronidazole 500mg  q6h oral End date: 9/21, six weeks   IV antibiotic discharge orders are pended. To discharging provider:  please sign these orders via discharge navigator,  Select New Orders & click on the button choice - Manage This Unsigned Work.     Thank you for allowing pharmacy to be a part of this patient's care.  Donna Christen Anne [name] 07/02/2018, 1:17 PM

## 2018-07-03 LAB — GLUCOSE, CAPILLARY
GLUCOSE-CAPILLARY: 216 mg/dL — AB (ref 70–99)
Glucose-Capillary: 212 mg/dL — ABNORMAL HIGH (ref 70–99)
Glucose-Capillary: 222 mg/dL — ABNORMAL HIGH (ref 70–99)

## 2018-07-03 LAB — HIV ANTIBODY (ROUTINE TESTING W REFLEX): HIV Screen 4th Generation wRfx: NONREACTIVE

## 2018-07-03 MED ORDER — SODIUM CHLORIDE 0.9 % IV SOLN: 2.0000 g | INTRAVENOUS | Status: AC

## 2018-07-03 MED ORDER — ONDANSETRON 4 MG PO TBDP: 4.0000 mg | ORAL_TABLET | Freq: Three times a day (TID) | ORAL | 0 refills | Status: DC | PRN

## 2018-07-03 MED ORDER — VANCOMYCIN HCL 10 G IV SOLR: 1500.0000 mg | Freq: Two times a day (BID) | INTRAVENOUS | Status: AC

## 2018-07-03 MED ORDER — SODIUM CHLORIDE 0.9 % IV SOLN
1500.0000 mg | Freq: Two times a day (BID) | INTRAVENOUS | Status: DC
  Administered 2018-07-03: 1500 mg via INTRAVENOUS
  Filled 2018-07-03 (×2): qty 1500

## 2018-07-03 MED ORDER — METRONIDAZOLE 500 MG PO TABS: 500.0000 mg | ORAL_TABLET | Freq: Four times a day (QID) | ORAL | 0 refills | Status: AC

## 2018-07-03 MED ORDER — ADULT MULTIVITAMIN W/MINERALS CH: 1.0000 | ORAL_TABLET | Freq: Every day | ORAL | Status: DC

## 2018-07-03 NOTE — Discharge Summary (Addendum)
Physician Discharge Summary  Anne [name]:[id number] DOB: [birth year] DOA: 07/01/2018  PCP: [facility] date: 07/01/2018 Discharge date: 07/03/2018  Time spent: 45 minutes  Recommendations for Outpatient Follow-up:  -Will be discharged home today. -To complete 6 weeks of IV vancomycin, IV rocephin and PO flagyl for her diabetic foot infection and osteomyelitis. STOP DATE is 08/13/18. -Will need to follow up with her podiatrist in [place], Dr. Charisse March. -HH RN will be arranged prior to DC.   Discharge Diagnoses:  Principal Problem:   Osteomyelitis ([place]) Active Problems:   Cellulitis in diabetic foot ([place])   Type 2 diabetes mellitus with vascular disease ([place])   Essential hypertension, benign   Discharge Condition: [place] and improved  Filed Weights   07/01/18 1524 07/01/18 1849  Weight: 93.9 kg 94.2 kg    History of present illness:  As per Dr. Shanon Brow on 8/9: Anne [name] is a 37 y.o. 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  She has been on oral antibiotics with her podiatrist including Septra doxycycline and rifampin for a month.  The infection has progressed she is got more redness to the area that is now streaking up her leg.  Imaging today shows that is progressed to osteomyelitis.  Patient is being referred for admission for diabetic foot infection.  Hospital Course:   Diabetic foot wound infection -Unfortunately MRI has documented osteomyelitis of the fifth metatarsal head and neck as well as the fifth proximal phalanx. -General surgery consultation was requested for consideration of amputation.  Patient would like to avoid further amputation at all costs.  She would prefer to complete 6 weeks of antibiotics, she understands that despite 6 weeks of antibiotics she may still require  amputation. -Case was discussed with Dr. Baxter Flattery with infectious diseases.  She recommends placement of a PICC line and 6 weeks of IV antibiotics consisting of vancomycin and Rocephin in addition oral Flagyl.  Stop date would be September 21.  Type 2 diabetes, uncontrolled with hyperglycemia -Continue insulin and adjust as necessary.  Hypertension -Fair control, continue home medications.  Procedures:  PICC line placement 8/10   Consultations:  None  Discharge Instructions  Discharge Instructions    Diet - low sodium heart healthy   Complete by:  As directed    Increase activity slowly   Complete by:  As directed      Allergies as of 07/03/2018   No Known Allergies     Medication List    STOP taking these medications   fluconazole 150 MG tablet Commonly known as:  DIFLUCAN   naproxen sodium 220 MG tablet Commonly known as:  ALEVE     TAKE these medications   ACCU-CHEK AVIVA device Use as instructed   BYDUREON 2 MG Srer Generic drug:  Exenatide ER Inject 2 mg into the skin once a week.   cefTRIAXone 2 g in sodium chloride 0.9 % 100 mL Inject 2 g into the vein daily.   gabapentin 300 MG capsule Commonly known as:  NEURONTIN Take 300 mg by mouth 3 (three) times daily as needed.   hydrOXYzine 50 MG tablet Commonly known as:  ATARAX/VISTARIL Take 50 mg by mouth every 6 (six) hours as needed.   LANTUS SOLOSTAR 100 UNIT/ML Solostar Pen Generic drug:  Insulin Glargine INNJECT 50 UNITS S.Q.  ONCE DAILY AT 10 P.M. What changed:  See the new instructions.   metFORMIN 500 MG tablet Commonly known as:  GLUCOPHAGE TAKE 1 TABLET BY MOUTH TWICE DAILY WITH MEALS. What changed:  when to take this   metoCLOPramide 10 MG/10ML Soln Commonly known as:  REGLAN Take 10 mg by mouth 3 (three) times daily. 3 TO 4 TIMES DAILY for possible Gastroparesis   metroNIDAZOLE 500 MG tablet Commonly known as:  FLAGYL Take 1 tablet (500 mg total) by mouth every 6 (six) hours.     multivitamin with minerals Tabs tablet Take 1 tablet by mouth daily. Start taking on:  07/04/2018   NOVOLOG FLEXPEN 100 UNIT/ML FlexPen Generic drug:  insulin aspart INJECT 12-18 UNITS S.Q. THREE TIMES DAILY WITH MEALS. What changed:  See the new instructions.   ondansetron 4 MG disintegrating tablet Commonly known as:  ZOFRAN-ODT Take 1 tablet (4 mg total) by mouth every 8 (eight) hours as needed for nausea or vomiting.   sertraline 100 MG tablet Commonly known as:  ZOLOFT Take 150 mg by mouth daily.   valsartan 160 MG tablet Commonly known as:  DIOVAN Take 160 mg by mouth daily.   vancomycin 1,500 mg in sodium chloride 0.9 % 500 mL Inject 1,500 mg into the vein every 12 (twelve) hours.      No Known Allergies Follow-up Information    Inocencio Homes, DPM. Schedule an appointment as soon as possible for a visit in 2 week(s).   Specialty:  Podiatry Contact information: [place]. [place]. [place] [address] [phone]        [facility] Follow up.   Why:  IV antibiotics Contact information: [address] [place]  [address] [phone]            The results of significant diagnostics from this hospitalization (including imaging, microbiology, ancillary and laboratory) are listed below for reference.    Significant Diagnostic Studies: Mr Foot Left Wo Contrast  Result Date: 07/01/2018 CLINICAL DATA:  37 year old female diabetic with nonhealing left foot wound along the lateral aspect of the left foot. EXAM: MRI OF THE LEFT FOOT WITHOUT CONTRAST TECHNIQUE: Multiplanar, multisequence MR imaging of the left forefoot was performed. No intravenous contrast was administered. COMPARISON:  8919 foot radiographs FINDINGS: The study is slightly compromised by motion artifacts. Bones/Joint/Cartilage Subtle cortical bone loss with marrow edema involving the left fifth metatarsal head and neck with associated soft  tissue swelling and ulceration along the lateral aspect of the forefoot adjacent to the fifth metatarsal. Marrow edema extends more proximally to the mid-diaphysis of the fifth metatarsal shaft. Marrow edema is also noted of the fifth proximal phalanx to the level of the phalangeal neck. The included midfoot and first through fourth rays are intact without marrow signal abnormality. A bone island is noted at the base of the fifth metatarsal. Ligaments Noncontributory Muscles and Tendons Diffuse intramuscular edema consistent with myositis. The tendons crossing the forefoot are of normal signal intensity morphology without rupture or tenosynovitis. Soft tissues Diffuse soft tissue swelling of the forefoot more localized adjacent to the fifth metatarsal head and neck with soft tissue ulceration seen at this level. IMPRESSION: Soft tissue ulceration along the lateral aspect of the forefoot adjacent to the fifth metatarsal head and neck. Marrow signal abnormality with subtle cortical bone loss of the fifth metatarsal head and neck and marrow edema of the fifth proximal phalanx are in keeping with acute osteomyelitis.  Electronically Signed   By: Ashley Royalty M.D.   On: 07/01/2018 20:20   Dg Foot Complete Left  Result Date: 07/01/2018 CLINICAL DATA:  Lateral foot ulcer, initial encounter EXAM: LEFT FOOT - COMPLETE 3+ VIEW COMPARISON:  05/23/2018 FINDINGS: Persistent skin ulcer is noted adjacent to the distal aspect of the fifth metatarsal. It appears slightly larger than that noted on the prior exam. Some mild lucency is noted over the head of the fifth metatarsal consistent with underlying osteomyelitis. IMPRESSION: Lucency in the head of the fifth metatarsal new from the prior exam consistent with focal osteomyelitis. Electronically Signed   By: Inez Catalina M.D.   On: 07/01/2018 16:42    Microbiology: Recent Results (from the past 240 hour(s))  Culture, blood (routine x 2)     Status: None (Preliminary result)     Collection Time: 07/01/18  3:53 PM  Result Value Ref Range Status   Specimen Description BLOOD LEFT ANTECUBITAL  Final   Special Requests   Final    BOTTLES DRAWN AEROBIC AND ANAEROBIC Blood Culture adequate volume   Culture   Final    NO GROWTH 2 DAYS Performed at [facility], [address], [place], [place] [address]    Report Status PENDING  Incomplete  Blood Cultures x 2 sites     Status: None (Preliminary result)   Collection Time: 07/01/18  6:33 PM  Result Value Ref Range Status   Specimen Description BLOOD LEFT ARM  Final   Special Requests   Final    BOTTLES DRAWN AEROBIC AND ANAEROBIC Blood Culture adequate volume   Culture   Final    NO GROWTH 2 DAYS Performed at [facility], [address], [place], [place] [address]    Report Status PENDING  Incomplete     Labs: Basic Metabolic Panel: Recent Labs  Lab 07/01/18 1546 07/01/18 1834  NA 135 137  K 3.7 3.5  CL 100 103  CO2 24 26  GLUCOSE 201* 115*  BUN 10 9  CREATININE 0.71 0.68  CALCIUM 9.5 8.7*   Liver Function Tests: No results for input(s): AST, ALT, ALKPHOS, BILITOT, PROT, ALBUMIN in the last 168 hours. No results for input(s): LIPASE, AMYLASE in the last 168 hours. No results for input(s): AMMONIA in the last 168 hours. CBC: Recent Labs  Lab 07/01/18 1546 07/01/18 1834  WBC 12.4* 11.6*  NEUTROABS 9.6* 8.8*  HGB 12.3 11.6*  HCT 35.4* 34.3*  MCV 88.7 88.9  PLT 250 247   Cardiac Enzymes: Recent Labs  Lab 07/01/18 1834 07/02/18 0006 07/02/18 0652  TROPONINI <0.03 <0.03 <0.03   BNP: BNP (last 3 results) No results for input(s): BNP in the last 8760 hours.  ProBNP (last 3 results) No results for input(s): PROBNP in the last 8760 hours.  CBG: Recent Labs  Lab 07/02/18 1609 07/02/18 2105 07/03/18 0717 07/03/18 1107 07/03/18 1629  GLUCAP 273* 236* 216* 212* 222*       Signed:  [facility] Pager: [phone] 07/03/2018, 6:58 PM

## 2018-07-03 NOTE — Progress Notes (Signed)
Pharmacy Antibiotic Note  Anne [name] is a 37 y.o. female admitted on 07/01/2018 with DFI.  Pharmacy has been consulted for vancomycin  dosing.  Plan: Vancomycin has been changed to vancomycin 1500mg  iv q12h due to OPAT consult and patient needing home health.Will follow with trough inpatient if patient can reach SS in hospital or it is ordered outpatient. Discussed plan with Dr Jerilee Hoh she agrees.    Goal trough 15-20 mcg/mL.   Height: 5\' 10"  (177.8 cm) Weight: 207 lb 9.6 oz (94.2 kg) IBW/kg (Calculated) : 68.5  Temp (24hrs), Avg:98.3 F (36.8 C), Min:98.1 F (36.7 C), Max:98.7 F (37.1 C)  Recent Labs  Lab 07/01/18 1546 07/01/18 1602 07/01/18 1834  WBC 12.4*  --  11.6*  CREATININE 0.71  --  0.68  LATICACIDVEN  --  2.03*  --     Estimated Creatinine Clearance: 119.8 mL/min (by C-G formula based on SCr of 0.68 mg/dL).    No Known Allergies  Antimicrobials this admission: 8/9 flagyl >>  8/9 cefepime >>8/10 8/9 vancomycin >>  8/10 ceftriaxone >>  Indication: osteomyelitis Regimen: vancomycin 1500mg  iv q12h (changed from 1gm q8h at the preference of max interval from Dr Jerilee Hoh), ceftriaxone 2gm iv q24h, metronidazole 500mg  q6h oral End date: 9/21, six weeks  Microbiology results: 8/9 BCx: ngtd 8/9 aerobic culture from left foot >> ngtd   Thank you for allowing pharmacy to be a part of this patient's care.  Donna Christen Severa Jeremiah 07/03/2018 11:08 AM

## 2018-07-03 NOTE — Care Management Note (Signed)
Case Management Note  Patient Details  Name: Anne [name] MRN: [id number] Date of Birth: [birth year]  Subjective/Objective:    Pt presented with diabetic foot wound.  Pt states she has used [facility] before and would like to use again for home IV abx.   Pt will be on Vancomycin Q12 and Rocephin Q24.    Action/Plan: D/W pt, nurse, Dr. Jerilee Hoh, and [place], [facility] liaison.  Pt will need afternoon and evening doses of antibiotics here.  Vanc is due at 7pm.  RN will give as early as possible and d/c patient tonight.  [facility] will deliver home medications to the patient in the hospital.  [facility] RN will visit home as early as possible in the morning to give Vancomycin.  Pt agreeable to plan.    Expected Discharge Date:  07/03/18               Expected Discharge Plan:  [place]  In-House Referral:  NA  Discharge planning Services  CM Consult  Post Acute Care Choice:  Home Health Choice offered to:  Patient  DME Arranged:  IV pump/equipment, Nebulizer/meds DME Agency:  [facility] Arranged:  RN [facility] Agency:  [facility]  Status of Service:  Completed, signed off  If discussed at [place] of Stay Meetings, dates discussed:    Additional Comments:  Claudie Leach, RN 07/03/2018, 1:25 PM

## 2018-07-03 NOTE — Progress Notes (Signed)
CSW acknowledging consult for "Access meds for discharge". Inappropriate consult; CSW unable to assist with medication assistance. If patient is in need of assistance with accessing medications at discharge, please consult RNCM.  CSW signing off.  Laveda Abbe, [place] Clinical Social Worker [phone]

## 2018-07-03 NOTE — Progress Notes (Signed)
Notified by Caryl Pina, CSM at [facility], that she is working on [facility] referral.  Patient may have to stay until evening dose of Vancomycin received, due to [facility] making first visit on Monday.  Will be in touch regarding HH.

## 2018-07-03 NOTE — Consult Note (Signed)
[place] Nurse wound consult note Surgery has evaluated and recommended topical treatment and a course of outpatient IV antibiotics.  Patient aware if this is unsuccessful at clearing osteomyelitis, amputation may be necessary.   Reason for Consult:Osteomyelitis Right foot with transmetatarsal amputation; left lateral foot at the proximal phalanx/ metatarsal head ulceration with black eschar measuring 1cm, no purulence or drainage expressed, surrounding granulation extending distally- per surgery notes Wound type:infectious neuropathic Consult and topical Orders provided via surgical team  No further WOC needs at this time Will not follow at this time.  Please re-consult if needed.  Domenic Moras RN BSN [place] Pager [phone]

## 2018-07-03 NOTE — Progress Notes (Signed)
AVS reviewed with patient.  Verbalized understanding of discharge instructions, physician follow-up, wound care.  Patient stable awaiting end of antibiotic infusion for discharge home.  Prescriptions given.  Night shift RN aware.

## 2018-07-03 NOTE — Progress Notes (Signed)
Late entry:  Patient dressing changed according to orders.  Patient tolerated well.

## 2018-07-03 NOTE — Progress Notes (Signed)
ABT infusion completed, flushed PICC with saline and clamped. Patient left facility in stable condition via wheelchair.

## 2018-07-04 ENCOUNTER — Other Ambulatory Visit (HOSPITAL_COMMUNITY): Payer: Self-pay | Admitting: Podiatry

## 2018-07-04 DIAGNOSIS — L97529 Non-pressure chronic ulcer of other part of left foot with unspecified severity: Secondary | ICD-10-CM

## 2018-07-04 NOTE — Progress Notes (Signed)
Late entry:  Dressing to left foot changed per orders.  Patient tolerated well.  Voiced no complaints.

## 2018-07-06 LAB — CULTURE, BLOOD (ROUTINE X 2)
CULTURE: NO GROWTH
CULTURE: NO GROWTH
SPECIAL REQUESTS: ADEQUATE
SPECIAL REQUESTS: ADEQUATE

## 2018-07-12 ENCOUNTER — Telehealth: Payer: Self-pay | Admitting: Orthopaedic Surgery

## 2018-07-12 NOTE — Telephone Encounter (Signed)
A lady from [place] called asking if Dr. Luna Glasgow was the ordering physician for this patient's IV antibiotics.  I told her no.  Dr Luna Glasgow did not order this for [facility]

## 2018-07-20 ENCOUNTER — Encounter: Payer: Self-pay | Admitting: Orthopaedic Surgery

## 2018-07-20 ENCOUNTER — Ambulatory Visit: Payer: Medicaid Other | Admitting: Orthopaedic Surgery

## 2018-07-20 VITALS — BP 139/89 | HR 96 | Ht 70.0 in | Wt 206.0 lb

## 2018-07-20 DIAGNOSIS — G8929 Other chronic pain: Secondary | ICD-10-CM

## 2018-07-20 DIAGNOSIS — M25511 Pain in right shoulder: Secondary | ICD-10-CM | POA: Diagnosis not present

## 2018-07-20 NOTE — Progress Notes (Signed)
Patient RX:[id number] Anne [name], female DOB:November 20, 1981, 37 y.o. YPP:[id number]  Chief Complaint  Patient presents with  . Shoulder Pain    Right    HPI  Anne [name] is a 37 y.o. female who has chronic right shoulder pain.  She has pain with overhead use.  She has no new trauma or weakness or numbness.  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     Body mass index is 29.56 kg/m.  ROS  Review of Systems  Constitutional:       Patient has Diabetes Mellitus. Patient has hypertension. Patient does not have COPD or shortness of breath. Patient does not have BMI > 35. Patient does not have current smoking history.  HENT: Negative for congestion.   Respiratory: Negative for cough and shortness of breath.   Cardiovascular: Negative for chest pain and leg swelling.  Gastrointestinal:       GERD  Endocrine: Positive for cold intolerance.  Musculoskeletal: Positive for arthralgias, gait problem, myalgias and neck pain.  Allergic/Immunologic: Positive for environmental allergies.  All other systems reviewed and are negative.   All other systems reviewed and are negative.  The following is a summary of the past history medically, past history surgically, known current medicines, social history and family history.  This information is gathered electronically by the computer from prior information and documentation.  I review this each visit and have found including this information at this point in the chart is beneficial and informative.    Past Medical History:  Diagnosis Date  . Acid reflux   . Anxiety   . Arthritis   . Diabetes mellitus type 1 ([place]) 10/31/2011  . Hypercholesteremia   . Hypertension   . Renal failure   . Sciatica     Past Surgical History:  Procedure Laterality Date  . AMPUTATION Right 11/03/2011   Procedure: AMPUTATION RAY;  Surgeon: Jamesetta So;  Location: [facility];  Service: General;  Laterality: Right;   Right fourth and fifth metatarsal   . APPENDECTOMY    . CESAREAN SECTION  2004 and 2007   x2  . TUBAL LIGATION      Family History  Problem Relation Age of Onset  . Diabetes Father   . Lung cancer Father   . Alcoholism Father   . Asthma Mother   . Kidney disease Mother   . Anemia Mother        hemolytic  . Hypertension Mother   . Heart attack Paternal Grandmother   . Diabetes Paternal Grandmother   . Diabetes Paternal Grandfather   . Anesthesia problems Neg Hx   . Hypotension Neg Hx   . Malignant hyperthermia Neg Hx   . Pseudochol deficiency Neg Hx     Social History Social History   Tobacco Use  . Smoking status: Former Smoker    Packs/day: 0.25    Years: 20.00    Pack years: 5.00    Types: Cigarettes    Last attempt to quit: 12/29/2013    Years since quitting: 4.5  . Smokeless tobacco: Never Used  Substance Use Topics  . Alcohol use: No  . Drug use: No    No Known Allergies  Current Outpatient Medications  Medication Sig Dispense Refill  . Blood Glucose Monitoring Suppl (ACCU-CHEK AVIVA) device Use as instructed 1 each 0  . cefTRIAXone 2 g in sodium chloride 0.9 % 100 mL Inject 2 g into the vein daily.    Marland Kitchen  Exenatide ER (BYDUREON) 2 MG SRER Inject 2 mg into the skin once a week.    . gabapentin (NEURONTIN) 300 MG capsule Take 300 mg by mouth 3 (three) times daily as needed.    . hydrOXYzine (ATARAX/VISTARIL) 50 MG tablet Take 50 mg by mouth every 6 (six) hours as needed.    Marland Kitchen LANTUS SOLOSTAR 100 UNIT/ML Solostar Pen INNJECT 50 UNITS S.Q. ONCE DAILY AT 10 P.M. (Patient taking differently: Inject 70 Units into the skin at bedtime. ) 15 mL 2  . metFORMIN (GLUCOPHAGE) 500 MG tablet TAKE 1 TABLET BY MOUTH TWICE DAILY WITH MEALS. (Patient taking differently: Take 500 mg by mouth 4 (four) times daily. ) 60 tablet 2  . metoCLOPramide (REGLAN) 10 MG/10ML SOLN Take 10 mg by mouth 3 (three) times daily. 3 TO 4 TIMES DAILY for possible Gastroparesis     . metroNIDAZOLE  (FLAGYL) 500 MG tablet Take 1 tablet (500 mg total) by mouth every 6 (six) hours. 164 tablet 0  . Multiple Vitamin (MULTIVITAMIN WITH MINERALS) TABS tablet Take 1 tablet by mouth daily.    Marland Kitchen NOVOLOG FLEXPEN 100 UNIT/ML FlexPen INJECT 12-18 UNITS S.Q. THREE TIMES DAILY WITH MEALS. (Patient taking differently: Inject 12-18 Units into the skin See admin instructions. Sliding Scale instructions: 100-150= 12 units 151-200= 14 units 201-250= 16 units 251-300= 18 units >300= 20 units) 15 mL 3  . ondansetron (ZOFRAN ODT) 4 MG disintegrating tablet Take 1 tablet (4 mg total) by mouth every 8 (eight) hours as needed for nausea or vomiting. 25 tablet 0  . sertraline (ZOLOFT) 100 MG tablet Take 150 mg by mouth daily.     . valsartan (DIOVAN) 160 MG tablet Take 160 mg by mouth daily.    . vancomycin 1,500 mg in sodium chloride 0.9 % 500 mL Inject 1,500 mg into the vein every 12 (twelve) hours.     Current Facility-Administered Medications  Medication Dose Route Frequency Provider Last Rate Last Dose  . 0.9 %  sodium chloride infusion  500 mL Intravenous Once Armbruster, Carlota Raspberry, MD         Physical Exam  Blood pressure 139/89, pulse 96, height 5\' 10"  (1.778 m), weight 206 lb (93.4 kg).  Constitutional: overall normal hygiene, normal nutrition, well developed, normal grooming, normal body habitus. Assistive device:none  Musculoskeletal: gait and station Limp none, muscle tone and strength are normal, no tremors or atrophy is present.  .  Neurological: coordination overall normal.  Deep tendon reflex/nerve stretch intact.  Sensation normal.  Cranial nerves II-XII intact.   Skin:   Normal overall no scars, lesions, ulcers or rashes. No psoriasis.  Psychiatric: Alert and oriented x 3.  Recent memory intact, remote memory unclear.  Normal mood and affect. Well groomed.  Good eye contact.  Cardiovascular: overall no swelling, no varicosities, no edema bilaterally, normal temperatures of the legs and  arms, no clubbing, cyanosis and good capillary refill.  Lymphatic: palpation is normal.  Right shoulder with full motion but pain on the extremes.  NV intact.  All other systems reviewed and are negative   The patient has been educated about the nature of the problem(s) and counseled on treatment options.  The patient appeared to understand what I have discussed and is in agreement with it.  Encounter Diagnosis  Name Primary?  . Chronic right shoulder pain Yes    PLAN Call if any problems.  Precautions discussed.  Continue current medications.   Return to clinic 3 months   Electronically  Signed Sanjuana Kava, MD 8/28/20192:32 PM

## 2018-07-21 ENCOUNTER — Ambulatory Visit: Payer: Medicaid Other | Admitting: Orthopaedic Surgery

## 2018-10-08 IMAGING — MR MR FOOT*L* W/O CM
4 of 5 series · 13 of 40 positions shown · non-contrast
Comparison: 4181 foot radiographs

CLINICAL DATA: 36-year-old female diabetic with nonhealing left
foot wound along the lateral aspect of the left foot.

EXAM:
MRI OF THE LEFT FOOT WITHOUT CONTRAST
TECHNIQUE: Multiplanar, multisequence MR imaging of the left forefoot was
performed. No intravenous contrast was administered.

[Series 4: T1 · oblique · 3.0mm · 0.19mm/px · 4 of 34 slices shown (1 of 2)]
[im 1/34]
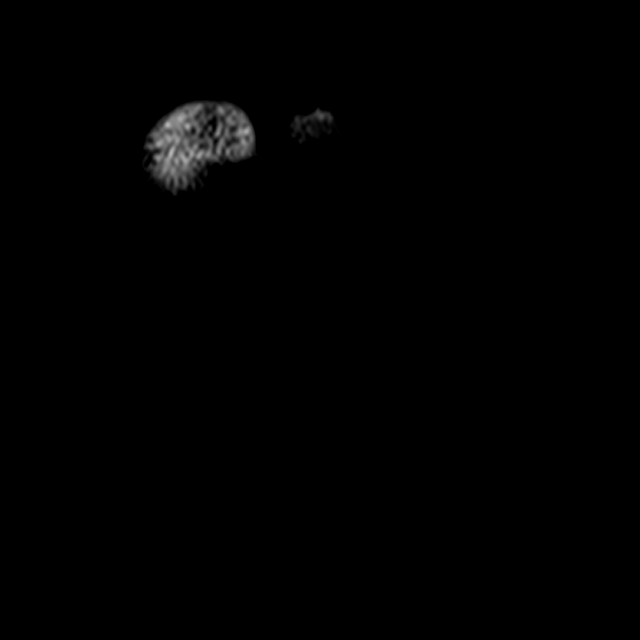
[im 5/34]
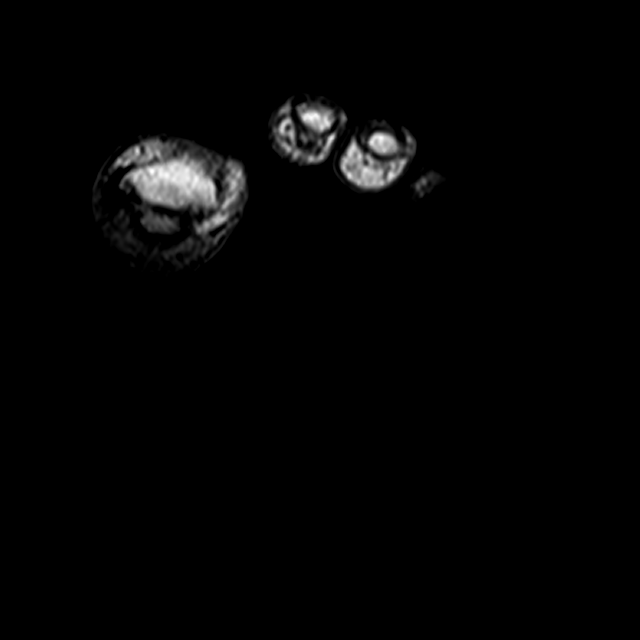
[im 17/34]
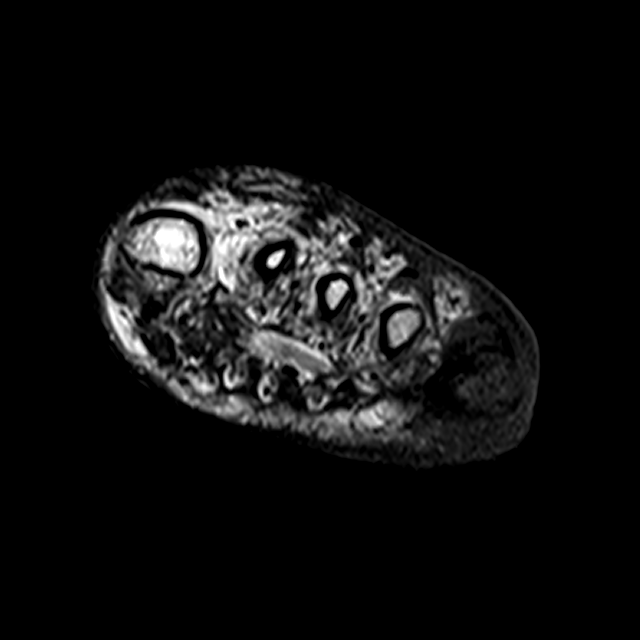
[im 29/34]
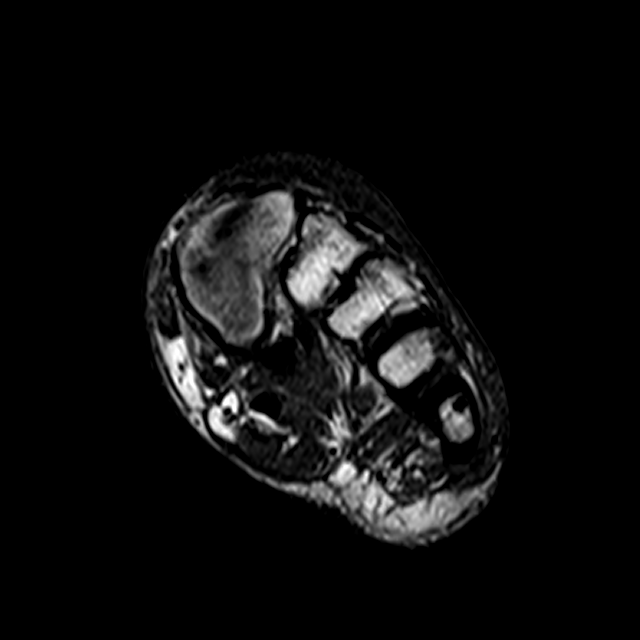

[Series 5: t2fs axial · oblique · 3.0mm · 0.19mm/px · 3 of 34 slices shown]
[im 5/34]
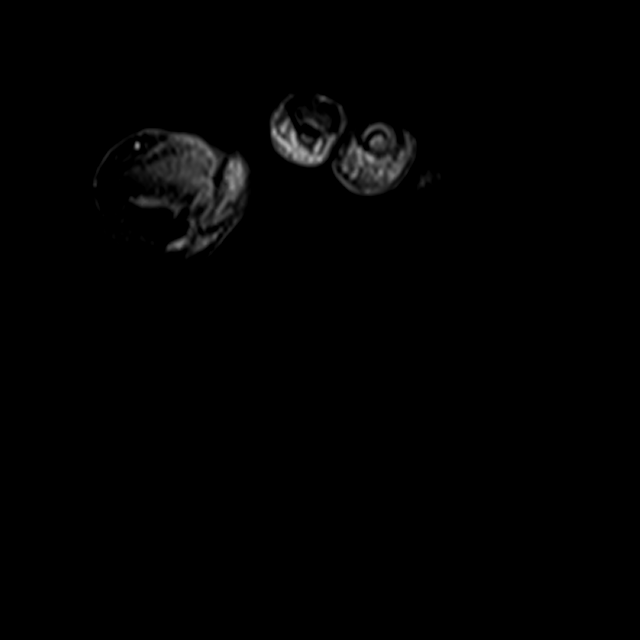
[im 17/34]
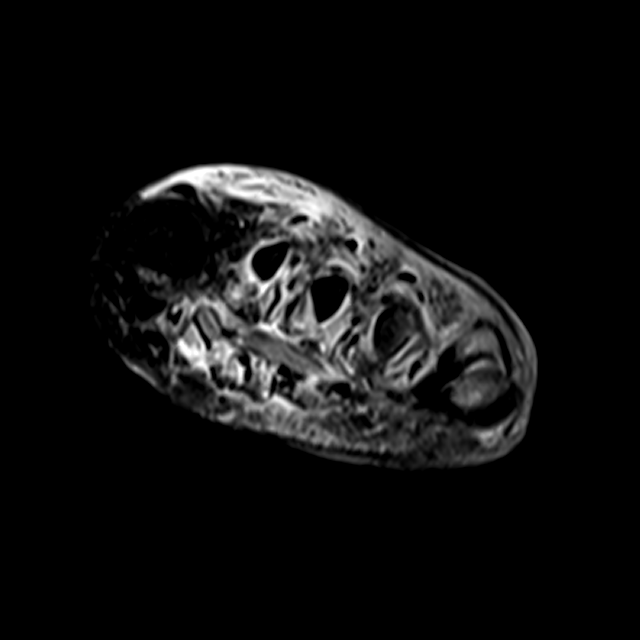
[im 29/34]
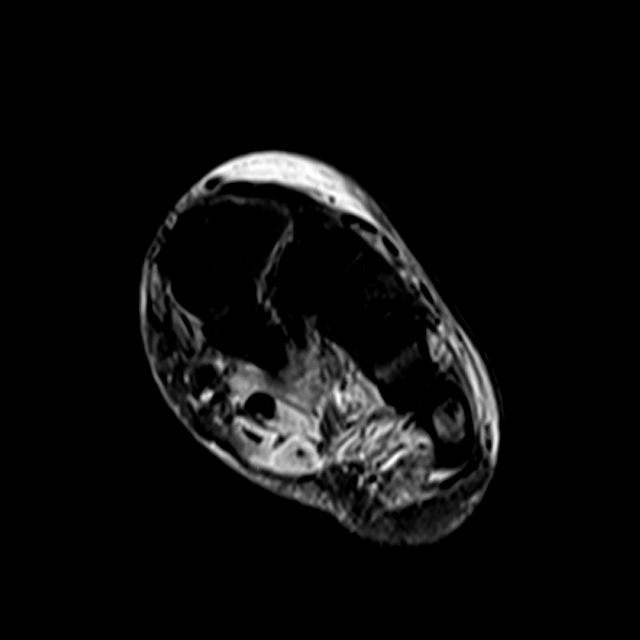

[Series 7: ir sagital · sagittal · 3.0mm · 0.27mm/px · 3 of 26 slices shown]
[im 5/26]
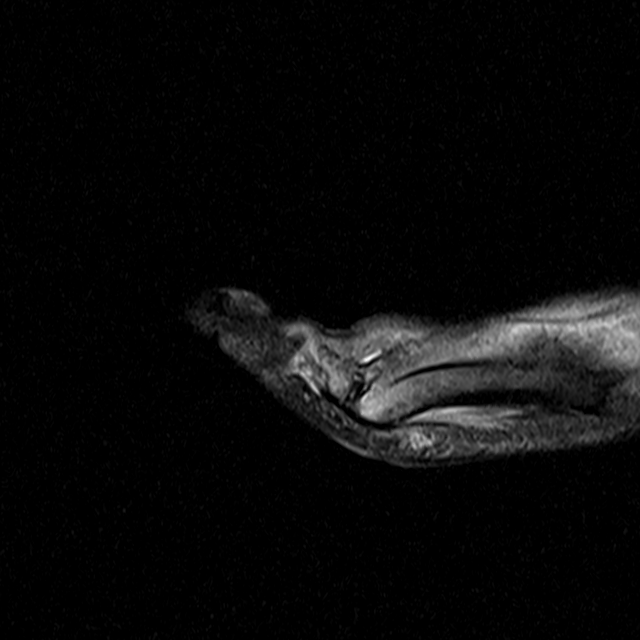
[im 13/26]
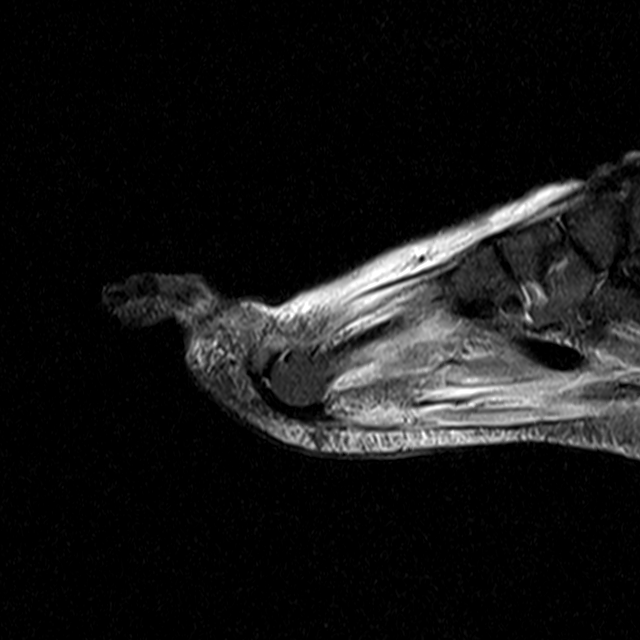
[im 21/26]
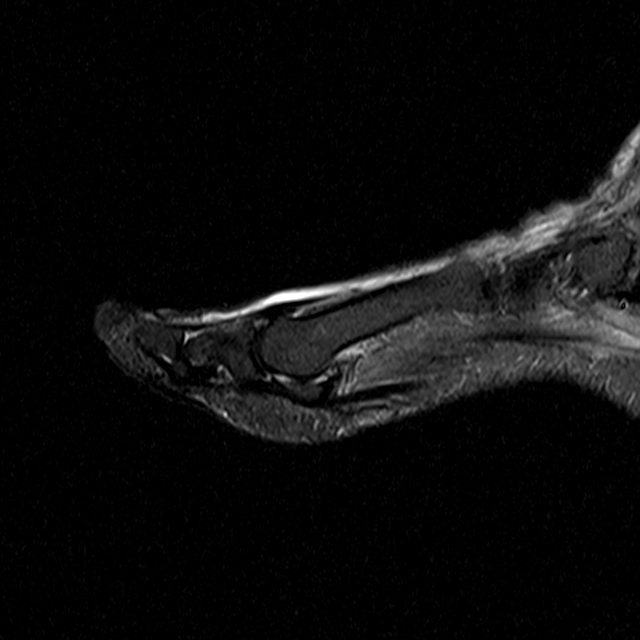

[Series 8: T1 · oblique · 3.0mm · 0.27mm/px · 3 of 28 slices shown (2 of 2)]
[im 4/28]
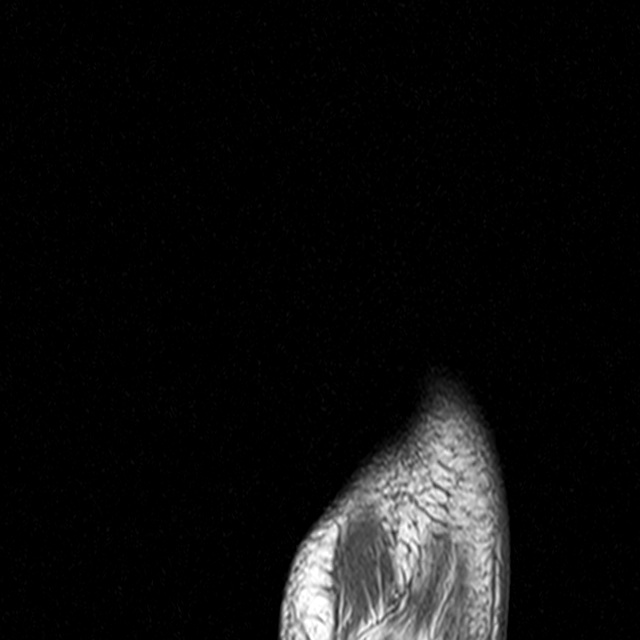
[im 16/28]
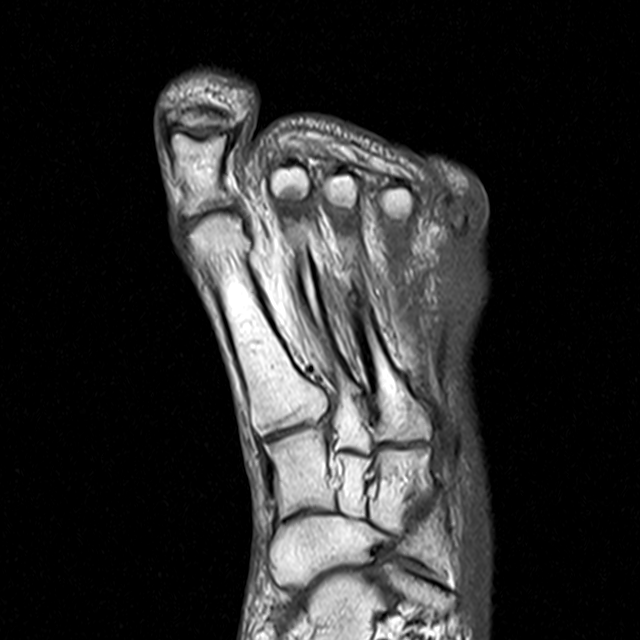
[im 24/28]
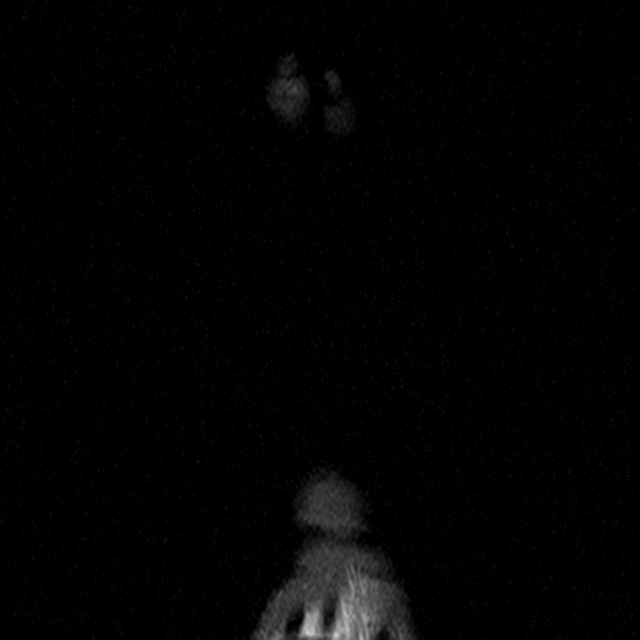

[13 of 40 positions shown; findings below may reference images not displayed]

FINDINGS: The study is slightly compromised by motion artifacts.

Bones/Joint/Cartilage

Subtle cortical bone loss with marrow edema involving the left fifth
metatarsal head and neck with associated soft tissue swelling and
ulceration along the lateral aspect of the forefoot adjacent to the
fifth metatarsal. Marrow edema extends more proximally to the
mid-diaphysis of the fifth metatarsal shaft. Marrow edema is also
noted of the fifth proximal phalanx to the level of the phalangeal
neck. The included midfoot and first through fourth rays are intact
without marrow signal abnormality. A bone island is noted at the
base of the fifth metatarsal.

Ligaments

Noncontributory

Muscles and Tendons

Diffuse intramuscular edema consistent with myositis. The tendons
crossing the forefoot are of normal signal intensity morphology
without rupture or tenosynovitis.

Soft tissues

Diffuse soft tissue swelling of the forefoot more localized adjacent
to the fifth metatarsal head and neck with soft tissue ulceration
seen at this level.
IMPRESSION: Soft tissue ulceration along the lateral aspect of the forefoot
adjacent to the fifth metatarsal head and neck. Marrow signal
abnormality with subtle cortical bone loss of the fifth metatarsal
head and neck and marrow edema of the fifth proximal phalanx are in
keeping with acute osteomyelitis.

## 2018-10-08 IMAGING — DX DG FOOT COMPLETE 3+V*L*
3 series · 3 of 3 positions shown · non-contrast
Comparison: 05/23/2018

CLINICAL DATA: Lateral foot ulcer, initial encounter

EXAM:
LEFT FOOT - COMPLETE 3+ VIEW

[foot ap]
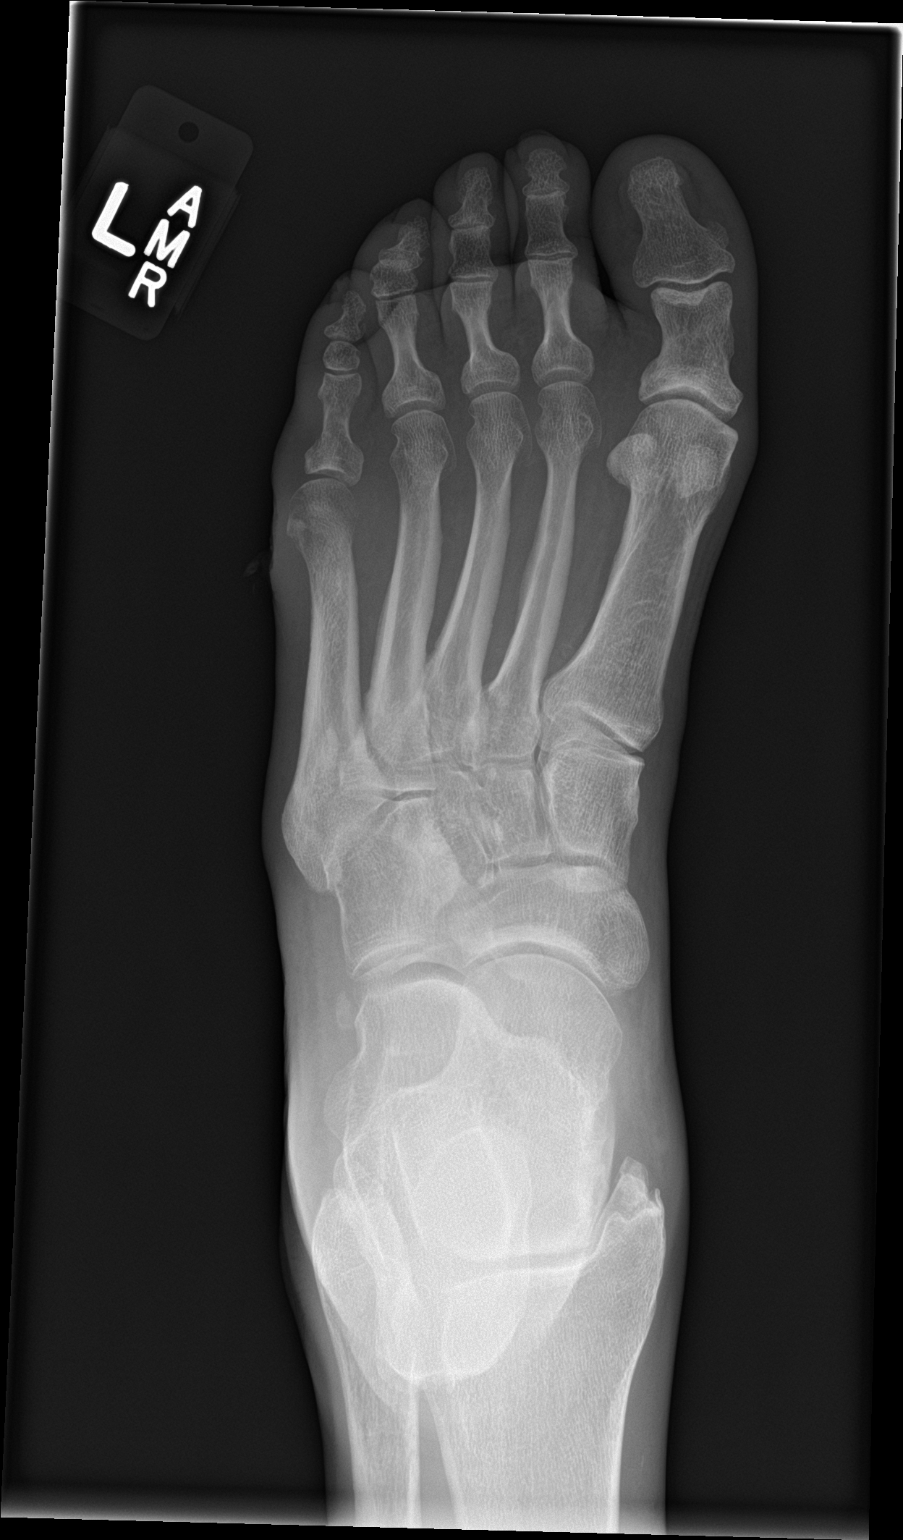

[foot obl]
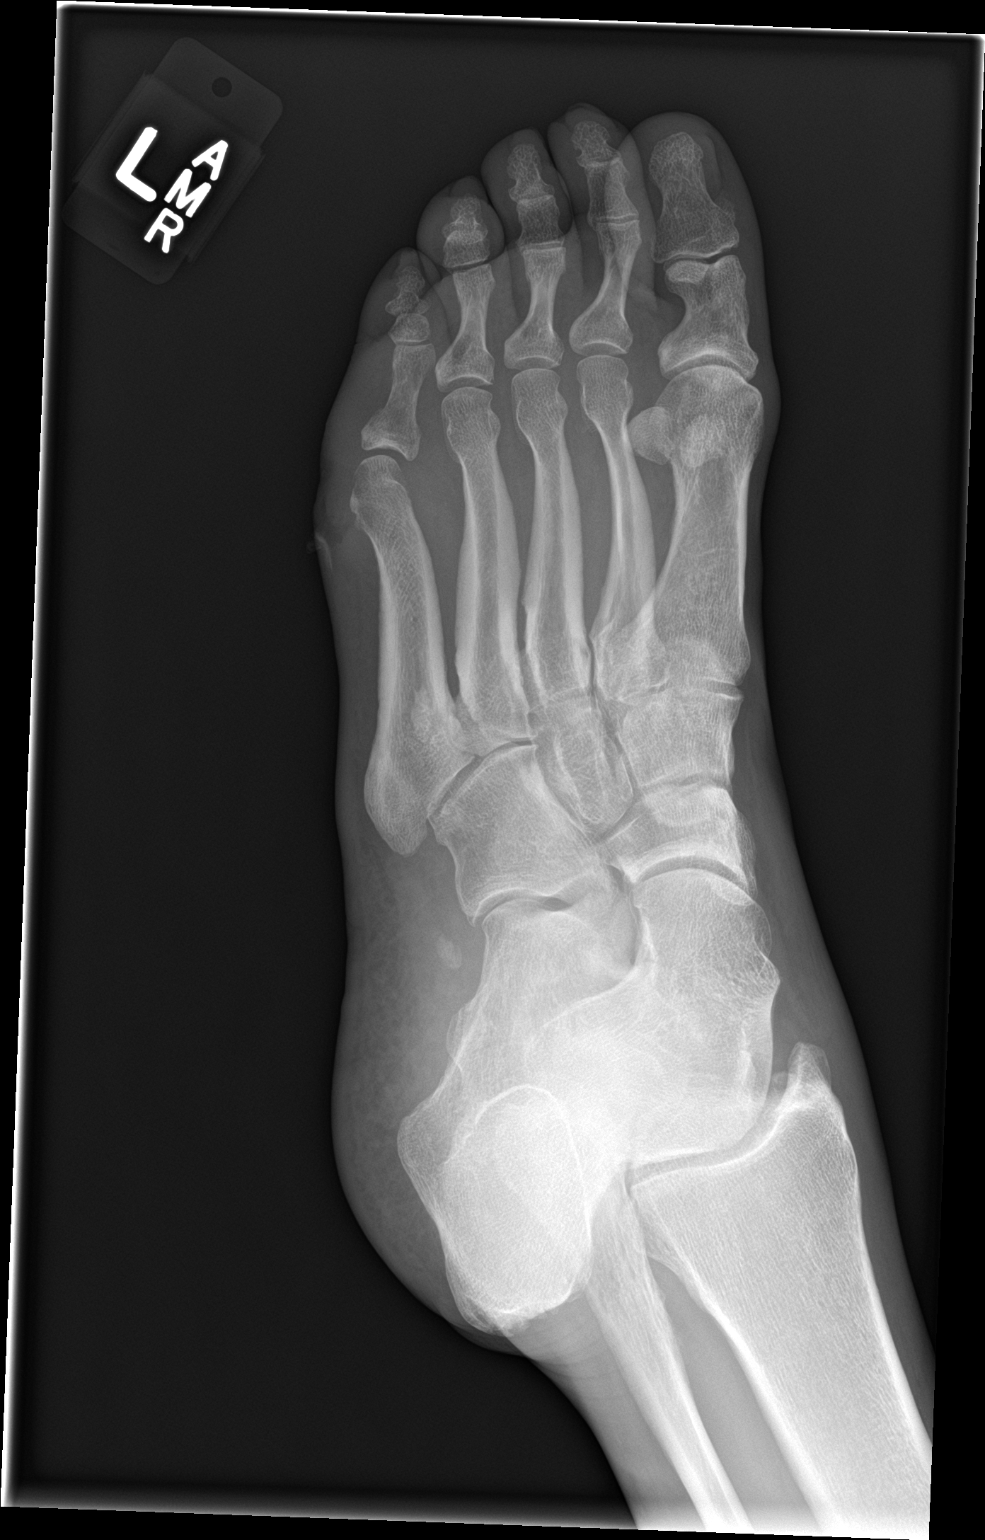

[foot lat]
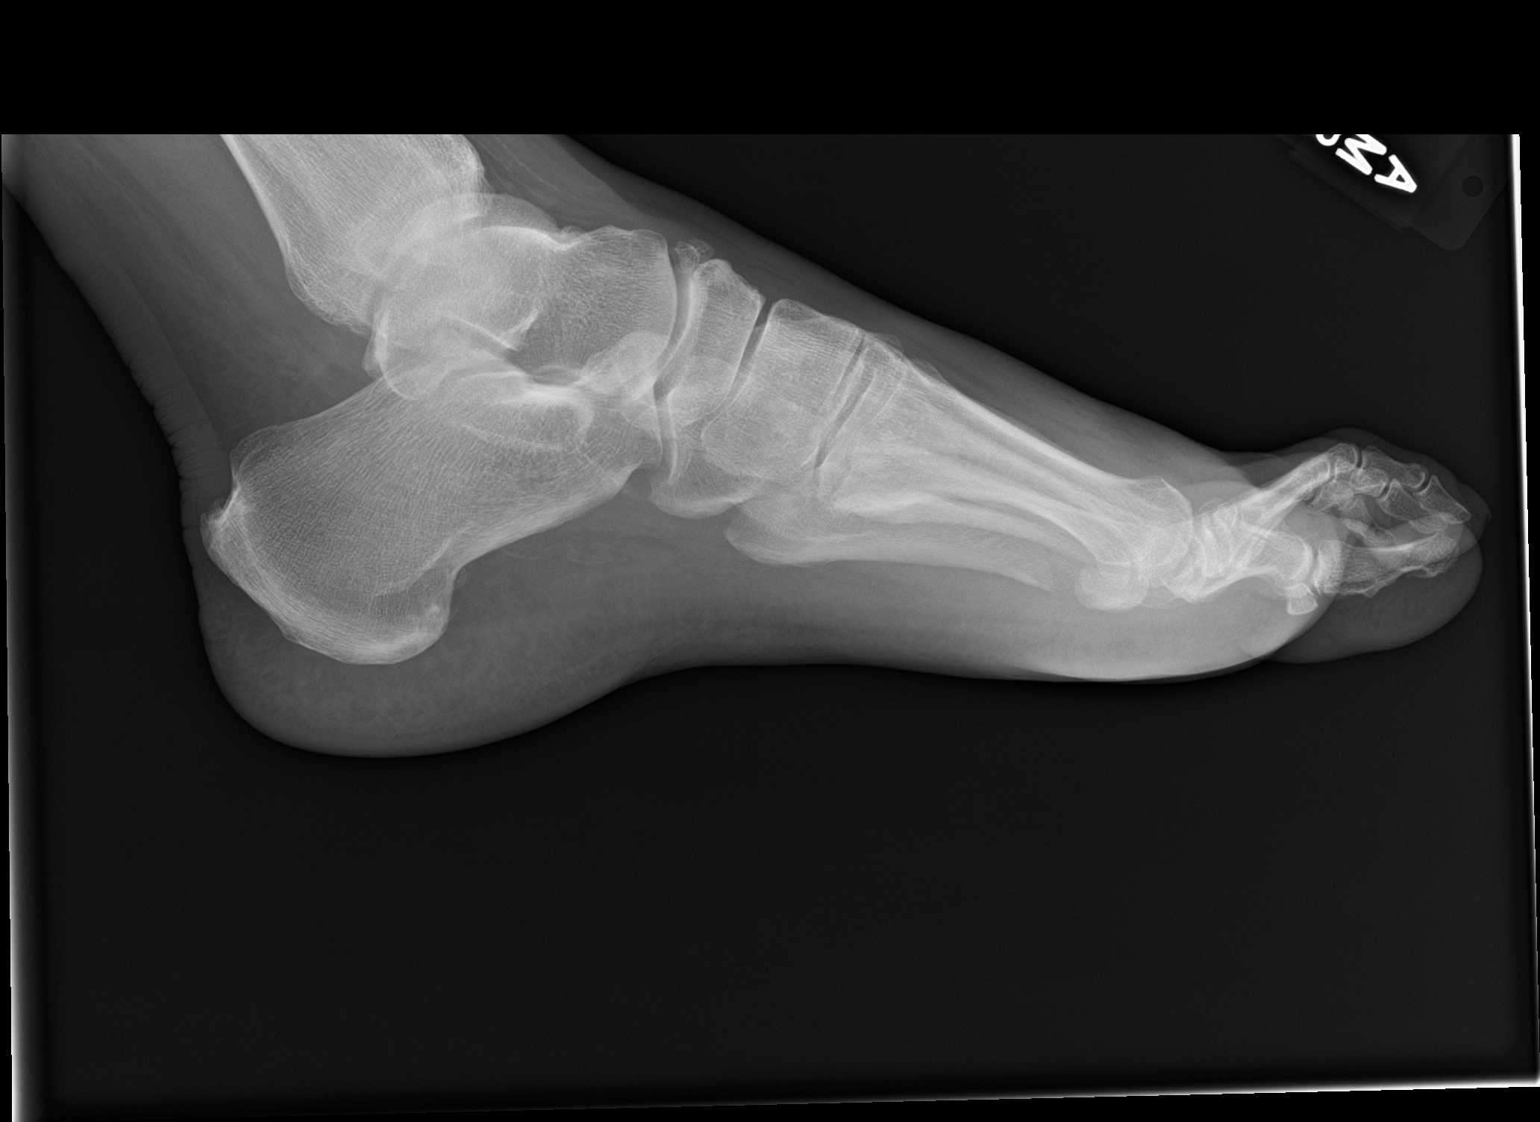

[3 of 3 positions shown; findings below may reference images not displayed]

FINDINGS: Persistent skin ulcer is noted adjacent to the distal aspect of the
fifth metatarsal. It appears slightly larger than that noted on the
prior exam. Some mild lucency is noted over the head of the fifth
metatarsal consistent with underlying osteomyelitis.
IMPRESSION: Lucency in the head of the fifth metatarsal new from the prior exam
consistent with focal osteomyelitis.

## 2018-10-12 ENCOUNTER — Ambulatory Visit: Payer: Medicaid Other | Admitting: Orthopaedic Surgery

## 2018-10-12 ENCOUNTER — Encounter: Payer: Self-pay | Admitting: Orthopaedic Surgery

## 2018-10-12 VITALS — BP 157/93 | HR 98 | Ht 70.0 in | Wt 207.0 lb

## 2018-10-12 DIAGNOSIS — M25511 Pain in right shoulder: Secondary | ICD-10-CM | POA: Diagnosis not present

## 2018-10-12 DIAGNOSIS — G8929 Other chronic pain: Secondary | ICD-10-CM

## 2018-10-12 MED ORDER — HYDROCODONE-ACETAMINOPHEN 5-325 MG PO TABS: ORAL_TABLET | ORAL | 0 refills | Status: DC

## 2018-10-12 NOTE — Progress Notes (Signed)
Patient Anne [name], female DOB:Feb 29, 1981, 37 y.o. RKY:[id number]  Chief Complaint  Patient presents with  . Shoulder Pain    Right sholder getting worse    HPI  Anne [name] is a 37 y.o. female who has chronic right shoulder pain that has gotten much worse with the colder weather.  She has pain with overhead use and rolling over on it at night.  She has no trauma, no numbness.  She is trying to do exercises with it.  Body mass index is 29.7 kg/m.  ROS  Review of Systems  Constitutional:       Patient has Diabetes Mellitus. Patient has hypertension. Patient does not have COPD or shortness of breath. Patient does not have BMI > 35. Patient does not have current smoking history.  HENT: Negative for congestion.   Respiratory: Negative for cough and shortness of breath.   Cardiovascular: Negative for chest pain and leg swelling.  Gastrointestinal:       GERD  Endocrine: Positive for cold intolerance.  Musculoskeletal: Positive for arthralgias, gait problem, myalgias and neck pain.  Allergic/Immunologic: Positive for environmental allergies.  All other systems reviewed and are negative.   All other systems reviewed and are negative.  The following is a summary of the past history medically, past history surgically, known current medicines, social history and family history.  This information is gathered electronically by the computer from prior information and documentation.  I review this each visit and have found including this information at this point in the chart is beneficial and informative.    Past Medical History:  Diagnosis Date  . Acid reflux   . Anxiety   . Arthritis   . Diabetes mellitus type 1 ([place]) 10/31/2011  . Hypercholesteremia   . Hypertension   . Renal failure   . Sciatica     Past Surgical History:  Procedure Laterality Date  . AMPUTATION Right 11/03/2011   Procedure: AMPUTATION RAY;  Surgeon: Jamesetta So;  Location: [facility];   Service: General;  Laterality: Right;  Right fourth and fifth metatarsal   . APPENDECTOMY    . CESAREAN SECTION  2004 and 2007   x2  . TUBAL LIGATION      Family History  Problem Relation Age of Onset  . Diabetes Father   . Lung cancer Father   . Alcoholism Father   . Asthma Mother   . Kidney disease Mother   . Anemia Mother        hemolytic  . Hypertension Mother   . Heart attack Paternal Grandmother   . Diabetes Paternal Grandmother   . Diabetes Paternal Grandfather   . Anesthesia problems Neg Hx   . Hypotension Neg Hx   . Malignant hyperthermia Neg Hx   . Pseudochol deficiency Neg Hx     Social History Social History   Tobacco Use  . Smoking status: Former Smoker    Packs/day: 0.25    Years: 20.00    Pack years: 5.00    Types: Cigarettes    Last attempt to quit: 12/29/2013    Years since quitting: 4.7  . Smokeless tobacco: Never Used  Substance Use Topics  . Alcohol use: No  . Drug use: No    No Known Allergies  Current Outpatient Medications  Medication Sig Dispense Refill  . Blood Glucose Monitoring Suppl (ACCU-CHEK AVIVA) device Use as instructed 1 each 0  . Exenatide ER (BYDUREON) 2 MG SRER Inject 2 mg into the skin once a  week.    . gabapentin (NEURONTIN) 300 MG capsule Take 300 mg by mouth 3 (three) times daily as needed.    Marland Kitchen HYDROcodone-acetaminophen (NORCO/VICODIN) 5-325 MG tablet One tablet every four hours as needed for acute pain.  Limit of five days per [place] statue. 30 tablet 0  . hydrOXYzine (ATARAX/VISTARIL) 50 MG tablet Take 50 mg by mouth every 6 (six) hours as needed.    Marland Kitchen LANTUS SOLOSTAR 100 UNIT/ML Solostar Pen INNJECT 50 UNITS S.Q. ONCE DAILY AT 10 P.M. (Patient taking differently: Inject 70 Units into the skin at bedtime. ) 15 mL 2  . metFORMIN (GLUCOPHAGE) 500 MG tablet TAKE 1 TABLET BY MOUTH TWICE DAILY WITH MEALS. (Patient taking differently: Take 500 mg by mouth 4 (four) times daily. ) 60 tablet 2  . metoCLOPramide (REGLAN) 10  MG/10ML SOLN Take 10 mg by mouth 3 (three) times daily. 3 TO 4 TIMES DAILY for possible Gastroparesis     . Multiple Vitamin (MULTIVITAMIN WITH MINERALS) TABS tablet Take 1 tablet by mouth daily.    Marland Kitchen NOVOLOG FLEXPEN 100 UNIT/ML FlexPen INJECT 12-18 UNITS S.Q. THREE TIMES DAILY WITH MEALS. (Patient taking differently: Inject 12-18 Units into the skin See admin instructions. Sliding Scale instructions: 100-150= 12 units 151-200= 14 units 201-250= 16 units 251-300= 18 units >300= 20 units) 15 mL 3  . ondansetron (ZOFRAN ODT) 4 MG disintegrating tablet Take 1 tablet (4 mg total) by mouth every 8 (eight) hours as needed for nausea or vomiting. 25 tablet 0  . sertraline (ZOLOFT) 100 MG tablet Take 150 mg by mouth daily.     . valsartan (DIOVAN) 160 MG tablet Take 160 mg by mouth daily.     Current Facility-Administered Medications  Medication Dose Route Frequency Provider Last Rate Last Dose  . 0.9 %  sodium chloride infusion  500 mL Intravenous Once Armbruster, Carlota Raspberry, MD         Physical Exam  Blood pressure (!) 157/93, pulse 98, height 5\' 10"  (1.778 m), weight 207 lb (93.9 kg).  Constitutional: overall normal hygiene, normal nutrition, well developed, normal grooming, normal body habitus. Assistive device:none  Musculoskeletal: gait and station Limp none, muscle tone and strength are normal, no tremors or atrophy is present.  .  Neurological: coordination overall normal.  Deep tendon reflex/nerve stretch intact.  Sensation normal.  Cranial nerves II-XII intact.   Skin:   Normal overall no scars, lesions, ulcers or rashes. No psoriasis.  Psychiatric: Alert and oriented x 3.  Recent memory intact, remote memory unclear.  Normal mood and affect. Well groomed.  Good eye contact.  Cardiovascular: overall no swelling, no varicosities, no edema bilaterally, normal temperatures of the legs and arms, no clubbing, cyanosis and good capillary refill.  Lymphatic: palpation is  normal.  Examination of right Upper Extremity is done.  Inspection:   Overall:  Elbow non-tender without crepitus or defects, forearm non-tender without crepitus or defects, wrist non-tender without crepitus or defects, hand non-tender.    Shoulder: with glenohumeral joint tenderness, without effusion.   Upper arm: without swelling and tenderness   Range of motion:   Overall:  Full range of motion of the elbow, full range of motion of wrist and full range of motion in fingers.   Shoulder:  right  160 degrees forward flexion; 150 degrees abduction; 30 degrees internal rotation, 30 degrees external rotation, 10 degrees extension, 40 degrees adduction.   Stability:   Overall:  Shoulder, elbow and wrist stable   Strength and  Tone:   Overall full shoulder muscles strength, full upper arm strength and normal upper arm bulk and tone.  All other systems reviewed and are negative   The patient has been educated about the nature of the problem(s) and counseled on treatment options.  The patient appeared to understand what I have discussed and is in agreement with it.  Encounter Diagnosis  Name Primary?  . Chronic right shoulder pain Yes    PLAN Call if any problems.  Precautions discussed.  Continue current medications.   Return to clinic 3 months   I have reviewed the [place] web site prior to prescribing narcotic medicine for this patient.    Electronically Signed Sanjuana Kava, MD 11/20/20192:01 PM

## 2018-10-19 ENCOUNTER — Ambulatory Visit: Payer: Medicaid Other | Admitting: Orthopaedic Surgery

## 2018-10-23 HISTORY — PX: CARDIAC CATHETERIZATION: SHX172

## 2018-11-11 ENCOUNTER — Encounter (HOSPITAL_COMMUNITY): Payer: Self-pay | Admitting: Emergency Medicine

## 2018-11-11 ENCOUNTER — Emergency Department (HOSPITAL_COMMUNITY): Payer: Medicaid Other

## 2018-11-11 ENCOUNTER — Inpatient Hospital Stay (HOSPITAL_COMMUNITY)
Admission: EM | Disposition: A | Discharge status: Home / Self Care | Payer: Self-pay | Source: Home / Self Care | Attending: Interventional Cardiology

## 2018-11-11 ENCOUNTER — Other Ambulatory Visit: Payer: Self-pay

## 2018-11-11 ENCOUNTER — Inpatient Hospital Stay (HOSPITAL_COMMUNITY)
Admission: EM | Admit: 2018-11-11 | Discharge: 2018-11-15 | DRG: 250 | Disposition: A | Discharge status: Home / Self Care | Payer: Medicaid Other | Attending: Interventional Cardiology | Admitting: Interventional Cardiology

## 2018-11-11 DIAGNOSIS — Z7984 Long term (current) use of oral hypoglycemic drugs: Secondary | ICD-10-CM

## 2018-11-11 DIAGNOSIS — I130 Hypertensive heart and chronic kidney disease with heart failure and stage 1 through stage 4 chronic kidney disease, or unspecified chronic kidney disease: Secondary | ICD-10-CM | POA: Diagnosis present

## 2018-11-11 DIAGNOSIS — Z801 Family history of malignant neoplasm of trachea, bronchus and lung: Secondary | ICD-10-CM | POA: Diagnosis not present

## 2018-11-11 DIAGNOSIS — Z79899 Other long term (current) drug therapy: Secondary | ICD-10-CM | POA: Diagnosis not present

## 2018-11-11 DIAGNOSIS — Z841 Family history of disorders of kidney and ureter: Secondary | ICD-10-CM

## 2018-11-11 DIAGNOSIS — Z794 Long term (current) use of insulin: Secondary | ICD-10-CM | POA: Diagnosis not present

## 2018-11-11 DIAGNOSIS — I213 ST elevation (STEMI) myocardial infarction of unspecified site: Secondary | ICD-10-CM | POA: Diagnosis present

## 2018-11-11 DIAGNOSIS — D72829 Elevated white blood cell count, unspecified: Secondary | ICD-10-CM | POA: Diagnosis present

## 2018-11-11 DIAGNOSIS — F419 Anxiety disorder, unspecified: Secondary | ICD-10-CM | POA: Diagnosis present

## 2018-11-11 DIAGNOSIS — I2109 ST elevation (STEMI) myocardial infarction involving other coronary artery of anterior wall: Secondary | ICD-10-CM

## 2018-11-11 DIAGNOSIS — I2102 ST elevation (STEMI) myocardial infarction involving left anterior descending coronary artery: Principal | ICD-10-CM | POA: Diagnosis present

## 2018-11-11 DIAGNOSIS — I25110 Atherosclerotic heart disease of native coronary artery with unstable angina pectoris: Secondary | ICD-10-CM | POA: Diagnosis present

## 2018-11-11 DIAGNOSIS — K219 Gastro-esophageal reflux disease without esophagitis: Secondary | ICD-10-CM | POA: Diagnosis present

## 2018-11-11 DIAGNOSIS — I5021 Acute systolic (congestive) heart failure: Secondary | ICD-10-CM | POA: Diagnosis present

## 2018-11-11 DIAGNOSIS — N183 Chronic kidney disease, stage 3 (moderate): Secondary | ICD-10-CM | POA: Diagnosis present

## 2018-11-11 DIAGNOSIS — Z833 Family history of diabetes mellitus: Secondary | ICD-10-CM

## 2018-11-11 DIAGNOSIS — I340 Nonrheumatic mitral (valve) insufficiency: Secondary | ICD-10-CM | POA: Diagnosis not present

## 2018-11-11 DIAGNOSIS — Z811 Family history of alcohol abuse and dependence: Secondary | ICD-10-CM

## 2018-11-11 DIAGNOSIS — Z9119 Patient's noncompliance with other medical treatment and regimen: Secondary | ICD-10-CM | POA: Diagnosis not present

## 2018-11-11 DIAGNOSIS — I255 Ischemic cardiomyopathy: Secondary | ICD-10-CM | POA: Diagnosis present

## 2018-11-11 DIAGNOSIS — Z825 Family history of asthma and other chronic lower respiratory diseases: Secondary | ICD-10-CM | POA: Diagnosis not present

## 2018-11-11 DIAGNOSIS — I739 Peripheral vascular disease, unspecified: Secondary | ICD-10-CM | POA: Diagnosis not present

## 2018-11-11 DIAGNOSIS — Z91199 Patient's noncompliance with other medical treatment and regimen due to unspecified reason: Secondary | ICD-10-CM

## 2018-11-11 DIAGNOSIS — Z8249 Family history of ischemic heart disease and other diseases of the circulatory system: Secondary | ICD-10-CM | POA: Diagnosis not present

## 2018-11-11 DIAGNOSIS — Z9049 Acquired absence of other specified parts of digestive tract: Secondary | ICD-10-CM

## 2018-11-11 DIAGNOSIS — Z87891 Personal history of nicotine dependence: Secondary | ICD-10-CM

## 2018-11-11 DIAGNOSIS — I361 Nonrheumatic tricuspid (valve) insufficiency: Secondary | ICD-10-CM | POA: Diagnosis not present

## 2018-11-11 DIAGNOSIS — L97529 Non-pressure chronic ulcer of other part of left foot with unspecified severity: Secondary | ICD-10-CM | POA: Diagnosis present

## 2018-11-11 DIAGNOSIS — E1069 Type 1 diabetes mellitus with other specified complication: Secondary | ICD-10-CM | POA: Diagnosis present

## 2018-11-11 DIAGNOSIS — E1169 Type 2 diabetes mellitus with other specified complication: Secondary | ICD-10-CM | POA: Diagnosis present

## 2018-11-11 DIAGNOSIS — I241 Dressler's syndrome: Secondary | ICD-10-CM | POA: Diagnosis present

## 2018-11-11 DIAGNOSIS — E1159 Type 2 diabetes mellitus with other circulatory complications: Secondary | ICD-10-CM | POA: Diagnosis present

## 2018-11-11 DIAGNOSIS — I10 Essential (primary) hypertension: Secondary | ICD-10-CM | POA: Diagnosis present

## 2018-11-11 DIAGNOSIS — E1043 Type 1 diabetes mellitus with diabetic autonomic (poly)neuropathy: Secondary | ICD-10-CM | POA: Diagnosis present

## 2018-11-11 DIAGNOSIS — E10621 Type 1 diabetes mellitus with foot ulcer: Secondary | ICD-10-CM | POA: Diagnosis present

## 2018-11-11 DIAGNOSIS — Z9851 Tubal ligation status: Secondary | ICD-10-CM

## 2018-11-11 DIAGNOSIS — I252 Old myocardial infarction: Secondary | ICD-10-CM | POA: Diagnosis present

## 2018-11-11 DIAGNOSIS — E1022 Type 1 diabetes mellitus with diabetic chronic kidney disease: Secondary | ICD-10-CM | POA: Diagnosis present

## 2018-11-11 DIAGNOSIS — Z9861 Coronary angioplasty status: Secondary | ICD-10-CM | POA: Diagnosis not present

## 2018-11-11 DIAGNOSIS — E785 Hyperlipidemia, unspecified: Secondary | ICD-10-CM | POA: Diagnosis present

## 2018-11-11 DIAGNOSIS — R1013 Epigastric pain: Secondary | ICD-10-CM

## 2018-11-11 DIAGNOSIS — Z888 Allergy status to other drugs, medicaments and biological substances status: Secondary | ICD-10-CM

## 2018-11-11 DIAGNOSIS — R112 Nausea with vomiting, unspecified: Secondary | ICD-10-CM

## 2018-11-11 DIAGNOSIS — I2510 Atherosclerotic heart disease of native coronary artery without angina pectoris: Secondary | ICD-10-CM | POA: Diagnosis present

## 2018-11-11 DIAGNOSIS — E7800 Pure hypercholesterolemia, unspecified: Secondary | ICD-10-CM | POA: Diagnosis present

## 2018-11-11 DIAGNOSIS — Z89431 Acquired absence of right foot: Secondary | ICD-10-CM

## 2018-11-11 DIAGNOSIS — M1990 Unspecified osteoarthritis, unspecified site: Secondary | ICD-10-CM | POA: Diagnosis present

## 2018-11-11 DIAGNOSIS — K3184 Gastroparesis: Secondary | ICD-10-CM | POA: Diagnosis present

## 2018-11-11 DIAGNOSIS — I420 Dilated cardiomyopathy: Secondary | ICD-10-CM | POA: Diagnosis present

## 2018-11-11 DIAGNOSIS — I370 Nonrheumatic pulmonary valve stenosis: Secondary | ICD-10-CM | POA: Diagnosis not present

## 2018-11-11 HISTORY — DX: Personal history of nicotine dependence: Z87.891

## 2018-11-11 HISTORY — DX: Chronic kidney disease, stage 1: N18.1

## 2018-11-11 HISTORY — PX: CORONARY/GRAFT ACUTE MI REVASCULARIZATION: CATH118305

## 2018-11-11 HISTORY — DX: Osteomyelitis, unspecified: M86.9

## 2018-11-11 HISTORY — DX: Gastroparesis: K31.84

## 2018-11-11 HISTORY — PX: LEFT HEART CATH AND CORONARY ANGIOGRAPHY: CATH118249

## 2018-11-11 HISTORY — DX: Atherosclerosis of autologous vein bypass graft(s) of the left leg with ulceration of unspecified site: I70.449

## 2018-11-11 LAB — LIPID PANEL
Cholesterol: 258 mg/dL — ABNORMAL HIGH (ref 0–200)
HDL: 38 mg/dL — ABNORMAL LOW (ref >40)
LDL Cholesterol: 187 mg/dL — ABNORMAL HIGH (ref 0–99)
Total CHOL/HDL Ratio: 6.8 RATIO
Triglycerides: 163 mg/dL — ABNORMAL HIGH (ref <150)
VLDL: 33 mg/dL (ref 0–40)

## 2018-11-11 LAB — COMPREHENSIVE METABOLIC PANEL
ALT: 21 U/L (ref 0–44)
AST: 52 U/L — ABNORMAL HIGH (ref 15–41)
Albumin: 4.1 g/dL (ref 3.5–5.0)
Alkaline Phosphatase: 53 U/L (ref 38–126)
Anion gap: 11 (ref 5–15)
BUN: 16 mg/dL (ref 6–20)
CO2: 24 mmol/L (ref 22–32)
Calcium: 9.2 mg/dL (ref 8.9–10.3)
Chloride: 98 mmol/L (ref 98–111)
Creatinine, Ser: 1.08 mg/dL — ABNORMAL HIGH (ref 0.44–1.00)
GFR calc Af Amer: >60 mL/min (ref >60)
GFR calc non Af Amer: >60 mL/min (ref >60)
GLUCOSE: 213 mg/dL — AB (ref 70–99)
Potassium: 3.6 mmol/L (ref 3.5–5.1)
Sodium: 133 mmol/L — ABNORMAL LOW (ref 135–145)
Total Bilirubin: 0.6 mg/dL (ref 0.3–1.2)
Total Protein: 7.9 g/dL (ref 6.5–8.1)

## 2018-11-11 LAB — CBC WITH DIFFERENTIAL/PLATELET
Abs Immature Granulocytes: 0.05 10*3/uL (ref 0.00–0.07)
Basophils Absolute: 0.1 10*3/uL (ref 0.0–0.1)
Basophils Relative: 0 %
Eosinophils Absolute: 0.2 10*3/uL (ref 0.0–0.5)
Eosinophils Relative: 1 %
HCT: 35.7 % — ABNORMAL LOW (ref 36.0–46.0)
Hemoglobin: 12.1 g/dL (ref 12.0–15.0)
Immature Granulocytes: 0 %
Lymphocytes Relative: 12 %
Lymphs Abs: 1.7 10*3/uL (ref 0.7–4.0)
MCH: 30.2 pg (ref 26.0–34.0)
MCHC: 33.9 g/dL (ref 30.0–36.0)
MCV: 89 fL (ref 80.0–100.0)
Monocytes Absolute: 0.9 10*3/uL (ref 0.1–1.0)
Monocytes Relative: 6 %
Neutro Abs: 12.1 10*3/uL — ABNORMAL HIGH (ref 1.7–7.7)
Neutrophils Relative %: 81 %
Platelets: 247 10*3/uL (ref 150–400)
RBC: 4.01 MIL/uL (ref 3.87–5.11)
RDW: 13 % (ref 11.5–15.5)
WBC: 15 10*3/uL — ABNORMAL HIGH (ref 4.0–10.5)
nRBC: 0 % (ref 0.0–0.2)

## 2018-11-11 LAB — GLUCOSE, CAPILLARY
GLUCOSE-CAPILLARY: 165 mg/dL — AB (ref 70–99)
Glucose-Capillary: 172 mg/dL — ABNORMAL HIGH (ref 70–99)
Glucose-Capillary: 219 mg/dL — ABNORMAL HIGH (ref 70–99)
Glucose-Capillary: 235 mg/dL — ABNORMAL HIGH (ref 70–99)

## 2018-11-11 LAB — PROTIME-INR
INR: 1.12
Prothrombin Time: 14.3 seconds (ref 11.4–15.2)

## 2018-11-11 LAB — MRSA PCR SCREENING: MRSA by PCR: POSITIVE — AB

## 2018-11-11 LAB — POCT I-STAT TROPONIN I: TROPONIN I, POC: 20.93 ng/mL — AB (ref 0.00–0.08)

## 2018-11-11 LAB — TROPONIN I
Troponin I: 19.59 ng/mL (ref <0.03)
Troponin I: 15.2 ng/mL (ref <0.03)

## 2018-11-11 LAB — APTT: aPTT: 34 seconds (ref 24–36)

## 2018-11-11 LAB — POCT ACTIVATED CLOTTING TIME: Activated Clotting Time: 296 seconds

## 2018-11-11 SURGERY — CORONARY/GRAFT ACUTE MI REVASCULARIZATION
Anesthesia: LOCAL

## 2018-11-11 MED ORDER — ONDANSETRON 4 MG PO TBDP
4.0000 mg | ORAL_TABLET | Freq: Three times a day (TID) | ORAL | Status: DC | PRN
  Administered 2018-11-12: 4 mg via ORAL
  Filled 2018-11-11 (×2): qty 1

## 2018-11-11 MED ORDER — ACETAMINOPHEN 325 MG PO TABS
650.0000 mg | ORAL_TABLET | ORAL | Status: DC | PRN
  Administered 2018-11-12 – 2018-11-13 (×6): 650 mg via ORAL
  Filled 2018-11-11 (×6): qty 2

## 2018-11-11 MED ORDER — METOPROLOL TARTRATE 5 MG/5ML IV SOLN
5.0000 mg | Freq: Once | INTRAVENOUS | Status: AC
  Administered 2018-11-11: 5 mg via INTRAVENOUS
  Filled 2018-11-11: qty 5

## 2018-11-11 MED ORDER — ONDANSETRON HCL 4 MG/2ML IJ SOLN
4.0000 mg | Freq: Four times a day (QID) | INTRAMUSCULAR | Status: DC | PRN
  Administered 2018-11-11 – 2018-11-14 (×5): 4 mg via INTRAVENOUS
  Filled 2018-11-11 (×5): qty 2

## 2018-11-11 MED ORDER — LABETALOL HCL 5 MG/ML IV SOLN: 10.0000 mg | INTRAVENOUS | Status: AC | PRN

## 2018-11-11 MED ORDER — HYDRALAZINE HCL 20 MG/ML IJ SOLN: 5.0000 mg | INTRAMUSCULAR | Status: AC | PRN

## 2018-11-11 MED ORDER — SODIUM CHLORIDE 0.9 % IV SOLN: 250.0000 mL | INTRAVENOUS | Status: DC | PRN

## 2018-11-11 MED ORDER — ASPIRIN 81 MG PO CHEW
81.0000 mg | CHEWABLE_TABLET | Freq: Every day | ORAL | Status: DC
  Administered 2018-11-12 – 2018-11-15 (×4): 81 mg via ORAL
  Filled 2018-11-11 (×4): qty 1

## 2018-11-11 MED ORDER — FENTANYL CITRATE (PF) 100 MCG/2ML IJ SOLN
INTRAMUSCULAR | Status: AC
  Filled 2018-11-11: qty 2

## 2018-11-11 MED ORDER — SODIUM CHLORIDE 0.9% FLUSH: 3.0000 mL | INTRAVENOUS | Status: DC | PRN

## 2018-11-11 MED ORDER — VERAPAMIL HCL 2.5 MG/ML IV SOLN
INTRAVENOUS | Status: DC | PRN
  Administered 2018-11-11: 14:00:00 via INTRA_ARTERIAL

## 2018-11-11 MED ORDER — MIDAZOLAM HCL 2 MG/2ML IJ SOLN
INTRAMUSCULAR | Status: DC | PRN
  Administered 2018-11-11: 1 mg via INTRAVENOUS

## 2018-11-11 MED ORDER — VERAPAMIL HCL 2.5 MG/ML IV SOLN
INTRAVENOUS | Status: AC
  Filled 2018-11-11: qty 2

## 2018-11-11 MED ORDER — INSULIN ASPART 100 UNIT/ML ~~LOC~~ SOLN
0.0000 [IU] | Freq: Three times a day (TID) | SUBCUTANEOUS | Status: DC
  Administered 2018-11-12 (×2): 3 [IU] via SUBCUTANEOUS
  Administered 2018-11-12: 5 [IU] via SUBCUTANEOUS
  Administered 2018-11-13: 3 [IU] via SUBCUTANEOUS

## 2018-11-11 MED ORDER — MORPHINE SULFATE (PF) 4 MG/ML IV SOLN
4.0000 mg | Freq: Once | INTRAVENOUS | Status: AC
  Administered 2018-11-11: 4 mg via INTRAVENOUS
  Filled 2018-11-11: qty 1

## 2018-11-11 MED ORDER — TICAGRELOR 90 MG PO TABS
90.0000 mg | ORAL_TABLET | Freq: Two times a day (BID) | ORAL | Status: DC
  Administered 2018-11-11 – 2018-11-15 (×8): 90 mg via ORAL
  Filled 2018-11-11 (×8): qty 1

## 2018-11-11 MED ORDER — NITROGLYCERIN 1 MG/10 ML FOR IR/CATH LAB
INTRA_ARTERIAL | Status: DC | PRN
  Administered 2018-11-11 (×2): 200 ug via INTRACORONARY

## 2018-11-11 MED ORDER — HEPARIN SODIUM (PORCINE) 5000 UNIT/ML IJ SOLN
4000.0000 [IU] | Freq: Once | INTRAMUSCULAR | Status: AC
  Administered 2018-11-11: 4000 [IU] via INTRAVENOUS
  Filled 2018-11-11: qty 1

## 2018-11-11 MED ORDER — MORPHINE SULFATE (PF) 2 MG/ML IV SOLN
2.0000 mg | INTRAVENOUS | Status: DC | PRN
  Administered 2018-11-11 – 2018-11-14 (×20): 2 mg via INTRAVENOUS
  Filled 2018-11-11 (×20): qty 1

## 2018-11-11 MED ORDER — HEPARIN SODIUM (PORCINE) 1000 UNIT/ML IJ SOLN
INTRAMUSCULAR | Status: DC | PRN
  Administered 2018-11-11: 8000 [IU] via INTRAVENOUS
  Administered 2018-11-11: 3000 [IU] via INTRAVENOUS

## 2018-11-11 MED ORDER — ONDANSETRON HCL 4 MG/2ML IJ SOLN
4.0000 mg | Freq: Once | INTRAMUSCULAR | Status: AC
  Administered 2018-11-11: 4 mg via INTRAVENOUS
  Filled 2018-11-11: qty 2

## 2018-11-11 MED ORDER — NITROGLYCERIN 2 % TD OINT
0.5000 [in_us] | TOPICAL_OINTMENT | Freq: Once | TRANSDERMAL | Status: AC
  Administered 2018-11-11: 0.5 [in_us] via TOPICAL
  Filled 2018-11-11: qty 30

## 2018-11-11 MED ORDER — HEPARIN (PORCINE) IN NACL 1000-0.9 UT/500ML-% IV SOLN
INTRAVENOUS | Status: AC
  Filled 2018-11-11: qty 500

## 2018-11-11 MED ORDER — SODIUM CHLORIDE 0.9% FLUSH
3.0000 mL | Freq: Two times a day (BID) | INTRAVENOUS | Status: DC
  Administered 2018-11-11 – 2018-11-14 (×6): 3 mL via INTRAVENOUS

## 2018-11-11 MED ORDER — MIDAZOLAM HCL 2 MG/2ML IJ SOLN
INTRAMUSCULAR | Status: AC
  Filled 2018-11-11: qty 2

## 2018-11-11 MED ORDER — LIDOCAINE HCL (PF) 1 % IJ SOLN
INTRAMUSCULAR | Status: DC | PRN
  Administered 2018-11-11: 2 mL

## 2018-11-11 MED ORDER — IOHEXOL 350 MG/ML SOLN
INTRAVENOUS | Status: DC | PRN
  Administered 2018-11-11: 100 mL via INTRA_ARTERIAL

## 2018-11-11 MED ORDER — HEPARIN (PORCINE) 25000 UT/250ML-% IV SOLN
1050.0000 [IU]/h | INTRAVENOUS | Status: DC
  Administered 2018-11-11: 12 [IU]/kg/h via INTRAVENOUS

## 2018-11-11 MED ORDER — ASPIRIN 81 MG PO CHEW
324.0000 mg | CHEWABLE_TABLET | Freq: Once | ORAL | Status: AC
  Administered 2018-11-11: 324 mg via ORAL
  Filled 2018-11-11: qty 4

## 2018-11-11 MED ORDER — SODIUM CHLORIDE 0.9 % IV SOLN
INTRAVENOUS | Status: DC
  Administered 2018-11-11: 10 mL via INTRAVENOUS

## 2018-11-11 MED ORDER — FENTANYL CITRATE (PF) 100 MCG/2ML IJ SOLN
INTRAMUSCULAR | Status: DC | PRN
  Administered 2018-11-11: 25 ug via INTRAVENOUS
  Administered 2018-11-11: 50 ug via INTRAVENOUS

## 2018-11-11 MED ORDER — HEPARIN (PORCINE) 25000 UT/250ML-% IV SOLN
INTRAVENOUS | Status: AC
  Filled 2018-11-11: qty 250

## 2018-11-11 MED ORDER — TICAGRELOR 90 MG PO TABS
ORAL_TABLET | ORAL | Status: DC | PRN
  Administered 2018-11-11: 180 mg via ORAL

## 2018-11-11 MED ORDER — TICAGRELOR 90 MG PO TABS
ORAL_TABLET | ORAL | Status: AC
  Filled 2018-11-11: qty 2

## 2018-11-11 MED ORDER — LIDOCAINE HCL (PF) 1 % IJ SOLN
INTRAMUSCULAR | Status: AC
  Filled 2018-11-11: qty 30

## 2018-11-11 MED ORDER — MORPHINE SULFATE (PF) 4 MG/ML IV SOLN: 4.0000 mg | Freq: Once | INTRAVENOUS | Status: DC

## 2018-11-11 MED ORDER — NITROGLYCERIN 1 MG/10 ML FOR IR/CATH LAB
INTRA_ARTERIAL | Status: AC
  Filled 2018-11-11: qty 10

## 2018-11-11 MED ORDER — MORPHINE SULFATE (PF) 2 MG/ML IV SOLN
INTRAVENOUS | Status: AC
  Filled 2018-11-11: qty 1

## 2018-11-11 MED ORDER — ISOSORBIDE MONONITRATE ER 30 MG PO TB24
30.0000 mg | ORAL_TABLET | Freq: Every day | ORAL | Status: DC
  Administered 2018-11-11: 30 mg via ORAL
  Filled 2018-11-11: qty 1

## 2018-11-11 MED ORDER — MORPHINE SULFATE (PF) 2 MG/ML IV SOLN
2.0000 mg | Freq: Once | INTRAVENOUS | Status: AC
  Administered 2018-11-11: 2 mg via INTRAVENOUS
  Filled 2018-11-11: qty 1

## 2018-11-11 MED ORDER — ATORVASTATIN CALCIUM 80 MG PO TABS
80.0000 mg | ORAL_TABLET | Freq: Every day | ORAL | Status: DC
  Administered 2018-11-11 – 2018-11-14 (×4): 80 mg via ORAL
  Filled 2018-11-11 (×4): qty 1

## 2018-11-11 MED ORDER — MORPHINE SULFATE (PF) 10 MG/ML IV SOLN
2.0000 mg | INTRAVENOUS | Status: DC | PRN
  Administered 2018-11-11: 2 mg via INTRAVENOUS

## 2018-11-11 SURGICAL SUPPLY — 18 items
BALLN SAPPHIRE 2.0X10 (BALLOONS) ×2
BALLOON SAPPHIRE 2.0X10 (BALLOONS) IMPLANT
CATH 5FR JL3.5 JR4 ANG PIG MP (CATHETERS) ×1 IMPLANT
CATH LAUNCHER 6FR EBU 3 (CATHETERS) ×1 IMPLANT
CATH LAUNCHER 6FR EBU3.5 (CATHETERS) ×1 IMPLANT
DEVICE RAD COMP TR BAND LRG (VASCULAR PRODUCTS) ×1 IMPLANT
GLIDESHEATH SLEND SS 6F .021 (SHEATH) ×1 IMPLANT
GUIDEWIRE INQWIRE 1.5J.035X260 (WIRE) IMPLANT
INQWIRE 1.5J .035X260CM (WIRE) ×2
KIT ENCORE 26 ADVANTAGE (KITS) ×1 IMPLANT
KIT HEART LEFT (KITS) ×2 IMPLANT
KIT HEMO VALVE WATCHDOG (MISCELLANEOUS) ×1 IMPLANT
PACK CARDIAC CATHETERIZATION (CUSTOM PROCEDURE TRAY) ×2 IMPLANT
SHEATH PROBE COVER 6X72 (BAG) ×1 IMPLANT
TRANSDUCER W/STOPCOCK (MISCELLANEOUS) ×2 IMPLANT
TUBING CIL FLEX 10 FLL-RA (TUBING) ×2 IMPLANT
WIRE ASAHI PROWATER 180CM (WIRE) ×1 IMPLANT
WIRE FIGHTER CROSSING 190CM (WIRE) ×1 IMPLANT

## 2018-11-11 NOTE — Progress Notes (Signed)
Pt still nauseous with vomiting. Cardiology was notified and told RN it was okay to administer PRN Zofran early. RN will continue to monitor pt.

## 2018-11-11 NOTE — ED Triage Notes (Signed)
Patient complaining of left sided chest pain and shortness of breath starting x 2 days.

## 2018-11-11 NOTE — ED Notes (Signed)
CRITICAL VALUE ALERT  Critical Value: troponin  Date & Time Notied: 11/11/2018 @ 1320  Provider Notified: JON KNAPP  Orders Received/Actions taken: TRANSFER TO CATH LAB

## 2018-11-11 NOTE — Progress Notes (Signed)
Pale and diaphoretic. Denies nausea. CP 8/10. Repeat EKG done and shown to MD .Dr. Irish Lack made aware

## 2018-11-11 NOTE — ED Provider Notes (Signed)
[facility] EMERGENCY DEPARTMENT Provider Note   CSN: [id number] Arrival date & time: 11/11/18  1242     History   Chief Complaint Chief Complaint  Patient presents with  . Chest Pain    HPI Anne [name] is a 37 y.o. female.  HPI Patient presented to the emergency room for evaluation of chest pain and shortness of breath.  Patient states her symptoms started about 2 days ago.  She has had pretty constant discomfort since then.  Patient initially thought it might be related to her gastroparesis.  She has been taking her regular medications however without any relief.  Has had nausea and vomiting with this.  She she denies any shortness of breath.  Patient does have a history of several medical problems including hypercholesterolemia, diabetes, hypertension and renal failure but she does not have any history of heart disease.  Past Medical History:  Diagnosis Date  . Acid reflux   . Anxiety   . Arthritis   . Diabetes mellitus type 1 ([place]) 10/31/2011  . Hypercholesteremia   . Hypertension   . Renal failure   . Sciatica     Patient Active Problem List   Diagnosis Date Noted  . Osteomyelitis ([place]) 07/01/2018  . Personal history of noncompliance with medical treatment, presenting hazards to health 10/28/2015  . Multiple nevi 10/07/2012  . Toe ulcer ([place]) 03/11/2012  . Essential hypertension, benign 03/11/2012  . 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 Diabetic neuropathy ([place]) 02/11/2012  . Back pain 02/11/2012  . Muscle spasm 02/11/2012  . Hyperlipidemia 02/11/2012  . Obesity 02/11/2012  . Cellulitis in diabetic foot ([place]) 10/31/2011    Past Surgical History:  Procedure Laterality Date  . AMPUTATION Right 11/03/2011   Procedure: AMPUTATION RAY;  Surgeon: Jamesetta So;  Location: [facility];  Service: General;  Laterality: Right;  Right fourth and fifth metatarsal   . APPENDECTOMY    . CESAREAN SECTION  2004 and 2007   x2  . TUBAL  LIGATION       OB History   No obstetric history on file.      Home Medications    Prior to Admission medications   Medication Sig Start Date End Date Taking? Authorizing Provider  Blood Glucose Monitoring Suppl (ACCU-CHEK AVIVA) device Use as instructed 10/28/15   Cassandria Anger, MD  Exenatide ER (BYDUREON) 2 MG SRER Inject 2 mg into the skin once a week.    [provider]  gabapentin (NEURONTIN) 300 MG capsule Take 300 mg by mouth 3 (three) times daily as needed.    [provider]  HYDROcodone-acetaminophen (NORCO/VICODIN) 5-325 MG tablet One tablet every four hours as needed for acute pain.  Limit of five days per [place] statue. 10/12/18   Sanjuana Kava, MD  hydrOXYzine (ATARAX/VISTARIL) 50 MG tablet Take 50 mg by mouth every 6 (six) hours as needed.    [provider]  LANTUS SOLOSTAR 100 UNIT/ML Solostar Pen INNJECT 50 UNITS S.Q. ONCE DAILY AT 10 P.M. Patient taking differently: Inject 70 Units into the skin at bedtime.  02/03/16   Cassandria Anger, MD  metFORMIN (GLUCOPHAGE) 500 MG tablet TAKE 1 TABLET BY MOUTH TWICE DAILY WITH MEALS. Patient taking differently: Take 500 mg by mouth 4 (four) times daily.  02/03/16   Cassandria Anger, MD  metoCLOPramide (REGLAN) 10 MG/10ML SOLN Take 10 mg by mouth 3 (three) times daily. 3 TO 4 TIMES DAILY for possible Gastroparesis  [provider]  Multiple Vitamin (MULTIVITAMIN WITH MINERALS) TABS tablet Take 1 tablet by mouth daily. 07/04/18   Isaac Bliss, Rayford Halsted, MD  NOVOLOG FLEXPEN 100 UNIT/ML FlexPen INJECT 12-18 UNITS S.Q. THREE TIMES DAILY WITH MEALS. Patient taking differently: Inject 12-18 Units into the skin See admin instructions. Sliding Scale instructions: 100-150= 12 units 151-200= 14 units 201-250= 16 units 251-300= 18 units >300= 20 units 04/29/16   Nida, Marella Chimes, MD  ondansetron (ZOFRAN ODT) 4 MG disintegrating tablet Take 1 tablet (4 mg total) by mouth every 8  (eight) hours as needed for nausea or vomiting. 07/03/18   Isaac Bliss, Rayford Halsted, MD  sertraline (ZOLOFT) 100 MG tablet Take 150 mg by mouth daily.     [provider]  valsartan (DIOVAN) 160 MG tablet Take 160 mg by mouth daily.    [provider]    Family History Family History  Problem Relation Age of Onset  . Diabetes Father   . Lung cancer Father   . Alcoholism Father   . Asthma Mother   . Kidney disease Mother   . Anemia Mother        hemolytic  . Hypertension Mother   . Heart attack Paternal Grandmother   . Diabetes Paternal Grandmother   . Diabetes Paternal Grandfather   . Anesthesia problems Neg Hx   . Hypotension Neg Hx   . Malignant hyperthermia Neg Hx   . Pseudochol deficiency Neg Hx     Social History Social History   Tobacco Use  . Smoking status: Former Smoker    Packs/day: 0.25    Years: 20.00    Pack years: 5.00    Types: Cigarettes    Last attempt to quit: 12/29/2013    Years since quitting: 4.8  . Smokeless tobacco: Never Used  Substance Use Topics  . Alcohol use: No  . Drug use: No     Allergies   Patient has no known allergies.   Review of Systems Review of Systems  All other systems reviewed and are negative.    Physical Exam Updated Vital Signs BP (!) 152/97 (BP Location: Right Arm)   Pulse (!) 124   Temp 97.7 F (36.5 C) (Oral)   Resp 18   Ht 1.778 m (5\' 10" )   Wt 93 kg   LMP 10/21/2018   SpO2 100%   BMI 29.41 kg/m   Physical Exam Vitals signs and nursing note reviewed.  Constitutional:      General: She is not in acute distress.    Appearance: She is obese.  HENT:     Head: Normocephalic and atraumatic.     Right Ear: External ear normal.     Left Ear: External ear normal.  Eyes:     General: No scleral icterus.       Right eye: No discharge.        Left eye: No discharge.     Conjunctiva/sclera: Conjunctivae normal.  Neck:     Musculoskeletal: Neck supple.     Trachea: No tracheal  deviation.  Cardiovascular:     Rate and Rhythm: Regular rhythm. Tachycardia present.  Pulmonary:     Effort: Pulmonary effort is normal. No respiratory distress.     Breath sounds: Normal breath sounds. No stridor. No wheezing or rales.  Abdominal:     General: Bowel sounds are normal. There is no distension.     Palpations: Abdomen is soft.     Tenderness: There is no abdominal tenderness.  There is no guarding or rebound.  Musculoskeletal:        General: No tenderness.  Skin:    General: Skin is warm and dry.     Findings: No rash.  Neurological:     Mental Status: She is alert.     Cranial Nerves: No cranial nerve deficit (no facial droop, extraocular movements intact, no slurred speech).     Sensory: No sensory deficit.     Motor: No abnormal muscle tone or seizure activity.     Coordination: Coordination normal.      ED Treatments / Results  Labs (all labs ordered are listed, but only abnormal results are displayed) Labs Reviewed  TROPONIN I  CBC WITH DIFFERENTIAL/PLATELET  PROTIME-INR  APTT  COMPREHENSIVE METABOLIC PANEL  LIPID PANEL  I-STAT TROPONIN, ED  I-STAT BETA HCG BLOOD, ED ([facility], [facility], [facility] ONLY)    EKG EKG Interpretation  Date/Time:  Friday November 11 2018 12:52:08 EST Ventricular Rate:  124 PR Interval:  132 QRS Duration: 92 QT Interval:  302 QTC Calculation: 433 R Axis:   -54 Text Interpretation:  - Critical Test Result: STEMI - AGE AND GENDER SPECIFIC ECG ANALYSIS - Sinus tachycardia Low voltage QRS Left anterior fascicular block Septal infarct , age undetermined Anterolateral injury pattern Inferior injury pattern ** ** ACUTE MI / STEMI ** ** Abnormal ECG stemi , new since last tracing Confirmed by Dorie Rank [phone]) on 11/11/2018 12:56:30 PM   Radiology No results found.  Procedures .Critical Care Performed by: Dorie Rank, MD Authorized by: Dorie Rank, MD   Critical care provider statement:    Critical care time (minutes):  25   Critical  care was time spent personally by me on the following activities:  Discussions with consultants, evaluation of patient's response to treatment, examination of patient, ordering and performing treatments and interventions, ordering and review of laboratory studies, ordering and review of radiographic studies, pulse oximetry, re-evaluation of patient's condition, obtaining history from patient or surrogate and review of old charts   (including critical care time)  Medications Ordered in ED Medications  0.9 %  sodium chloride infusion (has no administration in time range)  aspirin chewable tablet 324 mg (has no administration in time range)  heparin injection 4,000 Units (has no administration in time range)  ondansetron (ZOFRAN) injection 4 mg (has no administration in time range)  heparin ADULT infusion 100 units/mL (25000 units/243mL sodium chloride 0.45%) (has no administration in time range)     Initial Impression / Assessment and Plan / ED Course  I have reviewed the triage vital signs and the nursing notes.  Pertinent labs & imaging results that were available during my care of the patient were reviewed by me and considered in my medical decision making (see chart for details).  Clinical Course as of Nov 12 1311  Fri Nov 11, 2018  1303 Patient presents to the emergency room for evaluation of chest pain.  Her EKG is concerning for ST elevation myocardial infarction.  Symptom onset however has been 2 days ago.  We will start her on IV fluids heparin and activate code STEMI.   [QB]  3419 Discussed case with Dr Martinique.   Will transfer to [facility].   [JK]    Clinical Course User Index [JK] Dorie Rank, MD    Patient presented to the emergency room for evaluation of chest pain that started 2 days ago.  Her EKG suggests an acute myocardial infarction.  UnFortunately her symptoms did  start 2 days ago.  I discussed the case with Dr Martinique.   Plan on transfer to [facility] hospital  emergently.   Final Clinical Impressions(s) / ED Diagnoses   Final diagnoses:  ST elevation myocardial infarction (STEMI), unspecified artery ([place])      Dorie Rank, MD 11/11/18 1313

## 2018-11-11 NOTE — Progress Notes (Signed)
Pt complaining of chest pain 7/10. Heart rate in the 120's with blood pressure 141/100. Cardiology notified, orders placed for 5 MG of lopressor. Order completed, HR down to 110 with BP 118/84. Pt became diaphoretic and nauseous, CBG was completed (219), EKG was completed, Pt still complaining of chest pain 8/10 Morphine 2 MG was administered, pt began throwing up. Cardiology was notified, verbal orders for 2 more MG of Morphine to be administered and 1/2 inch of Nitro paste.  RN will continue to monitor pt.

## 2018-11-11 NOTE — ED Notes (Signed)
Transferred to [facility] cath-lab.  Report called to Nellie in cath lab.

## 2018-11-11 NOTE — H&P (Addendum)
Cardiology History & Physical    Patient ID: Anne [name] MRN: [id number], DOB: 11/23/1981 Date of Encounter: 11/11/2018, 2:05 PM Primary Physician: Alfonse Flavors, MD Primary Cardiologist: Dorris Carnes, MD  Chief Complaint: chest pain/"indigestion" Reason for Admission: STEMI Requesting MD: Dr. Tomi Bamberger  HPI: Anne [name] is a 37 y.o. female with history of Type 1 diabetes since age 24 complicated by gastroparesis, osteomyelitis, history of R forefoot amputation, L leg ulcer, HTN, HLD, CKD I, former tobacco abuse. On Wednesday she began developing intermittent substernal chest discomfort she thought was indigestion. Burping seemed to help. On Thursday she felt generalized myalgias and continued discomfort. Temp was 99. Her chest pain persisted so she proceeded to the [facility] ER where she was found to have an initial troponin of 20 and STEMI. She also reports n/v but not clear if this is related to gastroparesis. She denies any current tobacco, alcohol, or drug use. She received 324mg  ASA, 4000 heparin, 4mg  morphine, 4mg  Zofran at the [facility] and was transferred to [facility] for acute cath. She continues to complain of chest pain, worse with recumbency, improved sitting up, with some pleuritic component. Vitals reveal sinus tachycardia and hypertension.  Past Medical History:  Diagnosis Date  . Acid reflux   . Anxiety   . Arthritis   . 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 CKD (chronic kidney disease), stage I   . Diabetes mellitus type 1 ([place])   . Former tobacco use   . Gastroparesis   . Hypercholesteremia   . Hypertension   . Osteomyelitis ([place])    a. s/p R forefoot amputation.  . Renal failure   . Sciatica      Surgical History:  Past Surgical History:  Procedure Laterality Date  . AMPUTATION Right 11/03/2011   Procedure: AMPUTATION RAY;  Surgeon: Jamesetta So;  Location: [facility] ORS;  Service: General;  Laterality: Right;  Right  fourth and fifth metatarsal   . APPENDECTOMY    . CESAREAN SECTION  2004 and 2007   x2  . TUBAL LIGATION       Home Meds: Prior to Admission medications   Medication Sig Start Date End Date Taking? Authorizing Provider  Blood Glucose Monitoring Suppl (ACCU-CHEK AVIVA) device Use as instructed 10/28/15   Cassandria Anger, MD  Exenatide ER (BYDUREON) 2 MG SRER Inject 2 mg into the skin once a week.    [provider]  gabapentin (NEURONTIN) 300 MG capsule Take 300 mg by mouth 3 (three) times daily as needed.    [provider]  HYDROcodone-acetaminophen (NORCO/VICODIN) 5-325 MG tablet One tablet every four hours as needed for acute pain.  Limit of five days per [place]. 10/12/18   Sanjuana Kava, MD  hydrOXYzine (ATARAX/VISTARIL) 50 MG tablet Take 50 mg by mouth every 6 (six) hours as needed.    [provider]  LANTUS SOLOSTAR 100 UNIT/ML Solostar Pen INNJECT 50 UNITS S.Q. ONCE DAILY AT 10 P.M. Patient taking differently: Inject 70 Units into the skin at bedtime.  02/03/16   Cassandria Anger, MD  metFORMIN (GLUCOPHAGE) 500 MG tablet TAKE 1 TABLET BY MOUTH TWICE DAILY WITH MEALS. Patient taking differently: Take 500 mg by mouth 4 (four) times daily.  02/03/16   Cassandria Anger, MD  metoCLOPramide (REGLAN) 10 MG/10ML SOLN Take 10 mg by mouth 3 (three) times daily. 3 TO 4 TIMES DAILY for possible Gastroparesis  [provider]  Multiple Vitamin (MULTIVITAMIN WITH MINERALS) TABS tablet Take 1 tablet by mouth daily. 07/04/18   Isaac Bliss, Rayford Halsted, MD  NOVOLOG FLEXPEN 100 UNIT/ML FlexPen INJECT 12-18 UNITS S.Q. THREE TIMES DAILY WITH MEALS. Patient taking differently: Inject 12-18 Units into the skin See admin instructions. Sliding Scale instructions: 100-150= 12 units 151-200= 14 units 201-250= 16 units 251-300= 18 units >300= 20 units 04/29/16   Nida, Marella Chimes, MD  ondansetron (ZOFRAN ODT) 4 MG disintegrating tablet Take 1  tablet (4 mg total) by mouth every 8 (eight) hours as needed for nausea or vomiting. 07/03/18   Isaac Bliss, Rayford Halsted, MD  sertraline (ZOLOFT) 100 MG tablet Take 150 mg by mouth daily.     [provider]  valsartan (DIOVAN) 160 MG tablet Take 160 mg by mouth daily.    [provider]    Allergies: No Known Allergies  Social History   Socioeconomic History  . Marital status: Married    Spouse name: Not on file  . Number of children: 2  . Years of education: Not on file  . Highest education level: Not on file  Occupational History  . Not on file  Social Needs  . Financial resource strain: Not on file  . Food insecurity:    Worry: Not on file    Inability: Not on file  . Transportation needs:    Medical: Not on file    Non-medical: Not on file  Tobacco Use  . Smoking status: Former Smoker    Packs/day: 0.25    Years: 20.00    Pack years: 5.00    Types: Cigarettes    Last attempt to quit: 12/29/2013    Years since quitting: 4.8  . Smokeless tobacco: Never Used  Substance and Sexual Activity  . Alcohol use: No  . Drug use: No  . Sexual activity: Yes    Birth control/protection: Surgical  Lifestyle  . Physical activity:    Days per week: Not on file    Minutes per session: Not on file  . Stress: Not on file  Relationships  . Social connections:    Talks on phone: Not on file    Gets together: Not on file    Attends religious service: Not on file    Active member of club or organization: Not on file    Attends meetings of clubs or organizations: Not on file    Relationship status: Not on file  . Intimate partner violence:    Fear of current or ex partner: Not on file    Emotionally abused: Not on file    Physically abused: Not on file    Forced sexual activity: Not on file  Other Topics Concern  . Not on file  Social History Narrative  . Not on file     Family History  Problem Relation Age of Onset  . Diabetes Father   . Lung cancer  Father   . Alcoholism Father   . Asthma Mother   . Kidney disease Mother   . Anemia Mother        hemolytic  . Hypertension Mother   . Heart attack Paternal Grandmother   . Diabetes Paternal Grandmother   . Diabetes Paternal Grandfather   . Anesthesia problems Neg Hx   . Hypotension Neg Hx   . Malignant hyperthermia Neg Hx   . Pseudochol deficiency Neg Hx     Review of Systems All other systems reviewed and are otherwise  negative except as noted above.  Labs:   Lab Results  Component Value Date   WBC 15.0 (H) 11/11/2018   HGB 12.1 11/11/2018   HCT 35.7 (L) 11/11/2018   MCV 89.0 11/11/2018   PLT 247 11/11/2018    Recent Labs  Lab 11/11/18 1302  NA 133*  K 3.6  CL 98  CO2 24  BUN 16  CREATININE 1.08*  CALCIUM 9.2  PROT 7.9  BILITOT 0.6  ALKPHOS 53  ALT 21  AST 52*  GLUCOSE 213*   No results for input(s): CKTOTAL, CKMB, TROPONINI in the last 72 hours. No results found for: CHOL, HDL, LDLCALC, TRIG No results found for: DDIMER  Radiology/Studies:  Dg Chest Port 1 View  Result Date: 11/11/2018 CLINICAL DATA:  Left-sided chest pain, shortness of breath EXAM: PORTABLE CHEST 1 VIEW COMPARISON:  None. FINDINGS: The heart size and mediastinal contours are within normal limits. Both lungs are clear. The visualized skeletal structures are unremarkable. IMPRESSION: No active disease. Electronically Signed   By: Kathreen Devoid   On: 11/11/2018 13:15   Wt Readings from Last 3 Encounters:  11/11/18 93 kg  10/12/18 93.9 kg  07/20/18 93.4 kg    EKG: sinus tach with anterior ST elevation V2-V6 with Q waves through V5, also lead I, subtle rounded ST appearance inferiorly  Physical Exam: Blood pressure (!) 157/111, pulse (!) 124, temperature 97.7 F (36.5 C), temperature source Oral, resp. rate 18, height 5\' 10"  (1.778 m), weight 93 kg, last menstrual period 10/21/2018, SpO2 100 %. Body mass index is 29.41 kg/m. General: Well developed, well nourished overweight WF, in no  acute distress. Head: Normocephalic, atraumatic, sclera non-icteric, no xanthomas, nares are without discharge.  Neck: Negative for carotid bruits. JVD not elevated. Lungs: Clear bilaterally to auscultation without wheezes, rales, or rhonchi. Breathing is unlabored. Heart: RRR with S1 S2. No murmurs, rubs, or gallops appreciated. Abdomen: Soft, non-tender, non-distended with normoactive bowel sounds. No hepatomegaly. No rebound/guarding. No obvious abdominal masses. Msk:  Strength and tone appear normal for age. Extremities: No clubbing or cyanosis. No edema. Distal pulses diminished, extremities warm. RLE forefoot amputation. Did not assess left leg ulcer in acute setting. Neuro: Alert and oriented X 3. No focal deficit. No facial asymmetry. Moves all extremities spontaneously. Psych:  Responds to questions appropriately with a normal affect.    Assessment and Plan   1. Late presenting MI - to cath lab emergently. Will discuss post MI care with MD once case completed. Per preliminarily discussion with Dr. Irish Lack, hold off BB due to concern for acute LV dysfunction.  2. Possible post-MI pericarditis - plan echo this admission. Follow sx. Avoid NSAIDS in peri-MI period.  3. Hyperlipidemia - trig 258, LDL 187. Pt denies any allergies. Not on statin prior to admission for unclear reasons. Anticipate high dose statin initiation.  4. Leukocytosis - afebrile in lab, may be related to #1. Need to trend and follow for acute infective sx.  5. Diabetes mellitus - historically uncontrolled. A1C 10.1 in 06/2018. Pharmacy to clarify home med regimen. Anticipate holding metformin. May need to involve diabetes coordinator for assistance on optimizing regimen. Addendum: as of 5:30pm, patient not yet out of cath lab for pharmacy to finalize home med rec, so I signed out to on-call APP to look out for this.  6. HTN - will likely need escalation of therapy given hypertensive on arrival but will trend BP.    7. Prior osteomyelitis, R forefoot amputation and L foot ulcer -  she reports the left foot ulcer being managed as OP. Suspect underlying PAD based on comorbidities. Can assess once acute issues settle.   DVT prophylaxis - will need to be addressed post cath. Dr. Irish Lack did not wish to start today but should be reassessed tomorrow.     Severity of Illness: The appropriate patient status for this patient is INPATIENT. Inpatient status is judged to be reasonable and necessary in order to provide the required intensity of service to ensure the patient's safety. The patient's presenting symptoms, physical exam findings, and initial radiographic and laboratory data in the context of their chronic comorbidities is felt to place them at high risk for further clinical deterioration. Furthermore, it is not anticipated that the patient will be medically stable for discharge from the hospital within 2 midnights of admission. The following factors support the patient status of inpatient.   " The patient's presenting symptoms include chest pain (since Wed). " The worrisome physical exam findings include prior amputation, tachycardia. " The initial radiographic and laboratory data are worrisome because of STEMI on EKG, abnormal troponin. " The chronic co-morbidities include outlined above, DM HTN HLD former tobacco.   * I certify that at the point of admission it is my clinical judgment that the patient will require inpatient hospital care spanning beyond 2 midnights from the point of admission due to high intensity of service, high risk for further deterioration and high frequency of surveillance required.*    For questions or updates, please contact [place] Please consult [email or web address] for contact info under Cardiology/STEMI.  Signed, Charlie Pitter, PA-C 11/11/2018, 2:05 PM   I have examined the patient and reviewed assessment and plan and discussed with patient.  Agree with above as  stated.  Late presentation of antioerior MI.  Cath showed severe LAD disease, 95% lesion with moderate diffuse disease throughout.  CTO of OM2.    PTCA to LAD performed.  She will need aggressive CHF therapy as well.    Persistent chest pain may be more related to post MI pericarditis.  Avoid steroids.  Now on DAPT.  Given her youth, may be able to tolerate NSAIDs or high dose aspirin depending on kidney function.    She needs DM control for her aggressive premature vascular disease.  Larae Grooms

## 2018-11-12 LAB — BASIC METABOLIC PANEL
Anion gap: 13 (ref 5–15)
BUN: 15 mg/dL (ref 6–20)
CO2: 23 mmol/L (ref 22–32)
Calcium: 8.7 mg/dL — ABNORMAL LOW (ref 8.9–10.3)
Chloride: 98 mmol/L (ref 98–111)
Creatinine, Ser: 1.35 mg/dL — ABNORMAL HIGH (ref 0.44–1.00)
GFR calc Af Amer: 58 mL/min — ABNORMAL LOW (ref >60)
GFR calc non Af Amer: 50 mL/min — ABNORMAL LOW (ref >60)
Glucose, Bld: 278 mg/dL — ABNORMAL HIGH (ref 70–99)
POTASSIUM: 4.7 mmol/L (ref 3.5–5.1)
Sodium: 134 mmol/L — ABNORMAL LOW (ref 135–145)

## 2018-11-12 LAB — TROPONIN I: Troponin I: 10.95 ng/mL (ref <0.03)

## 2018-11-12 LAB — CBC
HCT: 29.5 % — ABNORMAL LOW (ref 36.0–46.0)
Hemoglobin: 10.2 g/dL — ABNORMAL LOW (ref 12.0–15.0)
MCH: 31 pg (ref 26.0–34.0)
MCHC: 34.6 g/dL (ref 30.0–36.0)
MCV: 89.7 fL (ref 80.0–100.0)
Platelets: 224 10*3/uL (ref 150–400)
RBC: 3.29 MIL/uL — ABNORMAL LOW (ref 3.87–5.11)
RDW: 12.9 % (ref 11.5–15.5)
WBC: 11.7 10*3/uL — AB (ref 4.0–10.5)
nRBC: 0 % (ref 0.0–0.2)

## 2018-11-12 LAB — URINALYSIS, MICROSCOPIC (REFLEX)
Bacteria, UA: NONE SEEN
WBC, UA: NONE SEEN WBC/hpf (ref 0–5)

## 2018-11-12 LAB — HEMOGLOBIN A1C
Hgb A1c MFr Bld: 7 % — ABNORMAL HIGH (ref 4.8–5.6)
Mean Plasma Glucose: 154.2 mg/dL

## 2018-11-12 LAB — URINALYSIS, ROUTINE W REFLEX MICROSCOPIC
Bilirubin Urine: NEGATIVE
Glucose, UA: 500 mg/dL — AB
Ketones, ur: NEGATIVE mg/dL
Leukocytes, UA: NEGATIVE
NITRITE: NEGATIVE
PH: 5.5 (ref 5.0–8.0)
Protein, ur: 100 mg/dL — AB
Specific Gravity, Urine: 1.02 (ref 1.005–1.030)

## 2018-11-12 LAB — GLUCOSE, CAPILLARY
GLUCOSE-CAPILLARY: 240 mg/dL — AB (ref 70–99)
Glucose-Capillary: 245 mg/dL — ABNORMAL HIGH (ref 70–99)
Glucose-Capillary: 286 mg/dL — ABNORMAL HIGH (ref 70–99)
Glucose-Capillary: 297 mg/dL — ABNORMAL HIGH (ref 70–99)
Glucose-Capillary: 283 mg/dL — ABNORMAL HIGH (ref 70–99)

## 2018-11-12 LAB — BRAIN NATRIURETIC PEPTIDE: B NATRIURETIC PEPTIDE 5: 500.8 pg/mL — AB (ref 0.0–100.0)

## 2018-11-12 MED ORDER — MUPIROCIN 2 % EX OINT
1.0000 "application " | TOPICAL_OINTMENT | Freq: Two times a day (BID) | CUTANEOUS | Status: DC
  Administered 2018-11-12 – 2018-11-14 (×6): 1 via NASAL
  Filled 2018-11-12 (×4): qty 22

## 2018-11-12 MED ORDER — METOPROLOL TARTRATE 12.5 MG HALF TABLET
12.5000 mg | ORAL_TABLET | Freq: Once | ORAL | Status: AC
  Administered 2018-11-12: 12.5 mg via ORAL
  Filled 2018-11-12: qty 1

## 2018-11-12 MED ORDER — ENOXAPARIN SODIUM 40 MG/0.4ML ~~LOC~~ SOLN
40.0000 mg | SUBCUTANEOUS | Status: DC
  Administered 2018-11-12 – 2018-11-14 (×3): 40 mg via SUBCUTANEOUS
  Filled 2018-11-12 (×3): qty 0.4

## 2018-11-12 MED ORDER — SODIUM CHLORIDE 0.9 % IV SOLN
INTRAVENOUS | Status: DC | PRN
  Administered 2018-11-12: 22:00:00 via INTRAVENOUS

## 2018-11-12 MED ORDER — CARVEDILOL 3.125 MG PO TABS
3.1250 mg | ORAL_TABLET | Freq: Two times a day (BID) | ORAL | Status: DC
  Administered 2018-11-12 – 2018-11-13 (×3): 3.125 mg via ORAL
  Filled 2018-11-12 (×3): qty 1

## 2018-11-12 MED ORDER — CHLORHEXIDINE GLUCONATE CLOTH 2 % EX PADS
6.0000 | MEDICATED_PAD | Freq: Every day | CUTANEOUS | Status: DC
  Administered 2018-11-12 – 2018-11-15 (×4): 6 via TOPICAL

## 2018-11-12 MED ORDER — COLCHICINE 0.6 MG PO TABS
0.6000 mg | ORAL_TABLET | Freq: Two times a day (BID) | ORAL | Status: DC
  Administered 2018-11-12 – 2018-11-15 (×7): 0.6 mg via ORAL
  Filled 2018-11-12 (×7): qty 1

## 2018-11-12 NOTE — Progress Notes (Signed)
..  EKG CRITICAL VALUE     12 lead EKG performed.  Critical value noted. Rexanne Mano, RN notified.   Ihor Gully, CCT 11/12/2018 7:37 AM

## 2018-11-12 NOTE — Progress Notes (Signed)
In and out cathed pt per MD verbal order. Output was 900 mls. During in and out cath there was some white thick drainage coming from urethra. Pt stated that she had a yeast infection prior to admission.

## 2018-11-12 NOTE — Progress Notes (Signed)
Pt states, " whatever pain medicine y'all gave me last time I feel a lot better than that Morphine." Pt was given the 650 mg PRN Tylenol. Pt states that she still has pain when she takes a deep breath in, but not like before. Will continue to monitor closely.  Lucius Conn, RN

## 2018-11-12 NOTE — Progress Notes (Signed)
Pt unable to void, tried bed pan x2. Bladder scanned with >200 in bladder. HR continues to be in 120's Cardiology notified, verbal order to go ahead and in and out cath pt, and verbal order for 12.5 mg tablet of Metoprolol.

## 2018-11-12 NOTE — Progress Notes (Signed)
CARDIAC REHAB PHASE I   1300 - 3546  Did not walk due to my concerns over her foot. Ed complete. Pt given MI booklet. Ed complete on MI, restrictions, infection prevention, NTG usage and medications ([name] and ASA). Ed complete on diet [facility] & DM) and exercise. Pt informed that her cholesterol levels will require lifestyle modifications. Pt referred to CRPII at [facility]. Pt and family voice understanding.   Philis Kendall, MS 11/12/2018 2:03 PM

## 2018-11-12 NOTE — Progress Notes (Signed)
Progress Note  Patient Name: Anne [name] Date of Encounter: 11/12/2018  Primary Cardiologist: Dorris Carnes, MD   Subjective   Persistent chest pain.  Worsens with deep inspiration and lying flat.  Unlike presenting chest pain.  Denies dyspnea.  Inpatient Medications    Scheduled Meds: . aspirin  81 mg Oral Daily  . atorvastatin  80 mg Oral q1800  . insulin aspart  0-9 Units Subcutaneous TID WC  . isosorbide mononitrate  30 mg Oral Daily  . morphine  4 mg Intravenous Once  . sodium chloride flush  3 mL Intravenous Q12H  . ticagrelor  90 mg Oral BID   Continuous Infusions: . sodium chloride     PRN Meds: sodium chloride, acetaminophen, morphine injection, ondansetron (ZOFRAN) IV, ondansetron, sodium chloride flush   Vital Signs    Vitals:   11/12/18 0404 11/12/18 0500 11/12/18 0600 11/12/18 0700  BP:  121/83 (!) 136/97 120/84  Pulse:  (!) 120 (!) 118 (!) 114  Resp:  (!) 24 (!) 22 19  Temp: 98.2 F (36.8 C)     TempSrc: Oral     SpO2:  99% 100% 100%  Weight:      Height:        Intake/Output Summary (Last 24 hours) at 11/12/2018 0748 Last data filed at 11/12/2018 0600 Gross per 24 hour  Intake 550.99 ml  Output 900 ml  Net -349.01 ml   Filed Weights   11/11/18 1249  Weight: 93 kg    Telemetry    Sinus to sinus tachycardia - Personally Reviewed  ECG    Normal sinus rhythm with diffuse ST elevation worse in the anterior lateral leads.  Unchanged compared to admission.- Personally Reviewed  Physical Exam   GEN: No acute distress.   Neck: No JVD Cardiac: RRR, no murmurs, rubs, or gallops.  Respiratory: Clear to auscultation bilaterally. GI: Soft, nontender, non-distended  MS: No edema; Radial cath site with no hematoma Neuro:  Nonfocal  Psych: Normal affect   Labs    Chemistry Recent Labs  Lab 11/11/18 1302 11/12/18 0246  NA 133* 134*  K 3.6 4.7  CL 98 98  CO2 24 23  GLUCOSE 213* 278*  BUN 16 15  CREATININE 1.08* 1.35*  CALCIUM  9.2 8.7*  PROT 7.9  --   ALBUMIN 4.1  --   AST 52*  --   ALT 21  --   ALKPHOS 53  --   BILITOT 0.6  --   GFRNONAA >60 50*  GFRAA >60 58*  ANIONGAP 11 13     Hematology Recent Labs  Lab 11/11/18 1302 11/12/18 0246  WBC 15.0* 11.7*  RBC 4.01 3.29*  HGB 12.1 10.2*  HCT 35.7* 29.5*  MCV 89.0 89.7  MCH 30.2 31.0  MCHC 33.9 34.6  RDW 13.0 12.9  PLT 247 224    Cardiac Enzymes Recent Labs  Lab 11/11/18 1302 11/11/18 1917 11/12/18 0246  TROPONINI 19.59* 15.20* 10.95*    Recent Labs  Lab 11/11/18 1305  TROPIPOC 20.93*     BNP Recent Labs  Lab 11/12/18 0246  BNP 500.8*     Radiology    Dg Chest Port 1 View  Result Date: 11/11/2018 CLINICAL DATA:  Left-sided chest pain, shortness of breath EXAM: PORTABLE CHEST 1 VIEW COMPARISON:  None. FINDINGS: The heart size and mediastinal contours are within normal limits. Both lungs are clear. The visualized skeletal structures are unremarkable. IMPRESSION: No active disease. Electronically Signed   By: Elbert Ewings  Posey Pronto   On: 11/11/2018 13:15    Patient Profile     37 y.o. female presenting with late anterior lateral myocardial infarction.  Cardiac catheterization revealed an occluded second marginal, 99% mid LAD and ejection fraction 25 to 35%.  Patient had PTCA of LAD.  Complained of chest pain following procedure that is felt to be post MI pericarditis.  Assessment & Plan    1 status post anterior myocardial infarction-patient with late presentation.  Status post PTCA of LAD.  Continue aspirin, [name] and statin.  Add carvedilol 3.125 mg twice daily.  Echocardiogram to assess LV function.  2 post MI pericarditis-patient with residual chest pain following PTCA.  Increases with inspiration and lying flat.  Consistent with pericarditis and electrocardiogram shows diffuse ST elevation.  I would like to avoid nonsteroidal given recent MI.  Add colchicine 0.6 mg twice daily.  Check echocardiogram to rule out effusion.  3  ischemic cardiomyopathy-add carvedilol 3.125 mg twice daily.  Creatinine slightly increased today following catheterization.  Will add ARB in next 24 to 48 hours if renal function stable.  Titrate medications as tolerated.  Will need to repeat echocardiogram in 3 months.  If ejection fraction less than 35% would need ICD.  4 hypertension-add carvedilol as outlined above.  Add ARB when renal function stable.  5 hyperlipidemia-continue statin.  6 acute on chronic stage III kidney disease-status post cardiac catheterization.  Follow renal function closely.  7 history of osteomyelitis with right amputation of forefoot- Follow-up primary care after discharge.  For questions or updates, please contact [place] Please consult [email or web address] for contact info under        Signed, Kirk Ruths, MD  11/12/2018, 7:48 AM

## 2018-11-12 NOTE — Progress Notes (Signed)
Initial Nutrition Assessment  DOCUMENTATION CODES:   Not applicable  INTERVENTION:   Reviewed diet education with pt, reviewed diabetic, heart healthy diet; importance of blood sugar control with regards to wound healing; appropriate diet for gastroparesis pt receptive to education  Pt only on sliding scale coverage at present; takes metformin and 70 units lantus daily at home. Recommend resuming long acting insulin  If po intake inadequate, recommend considering protein modular  NUTRITION DIAGNOSIS:   Increased nutrient needs related to acute illness, wound healing as evidenced by estimated needs.  GOAL:   Patient will meet greater than or equal to 90% of their needs  MONITOR:   PO intake, Supplement acceptance, Labs, Weight trends, Skin  REASON FOR ASSESSMENT:   Malnutrition Screening Tool    ASSESSMENT:   37 yo female admitted with MI s/p PTCA of LAD on 12/20. Pt with post MI pericarditis, ischemic CM PMH includes DM with gastroparesis, HTN, HLD, CKD I, osteomyelitis  Pt reports at home she takes sliding scale insulin, 70 units of lantus at night and metformin.  Pt reports CBGs at home have been in 200s. Noted HgbA1c 7.0  Pt reports appetite has been good; pt has been trying to eat healthier at home. Trying to eat less fried foods for her heart and gastroparesis; pt avoids red meat for this reason also. Pt has issues with heart burn, N/V at times from her gastroparesis  Pt denies wt loss; reports UBW around 200-210 pounds; current wt 205 pounds  Labs: CBGs 219-245, sodium 134, Creatinine 1.35, BUN wdl; HgbA1c 7.0 Meds: ss novolog  NUTRITION - FOCUSED PHYSICAL EXAM:    Most Recent Value  Orbital Region  No depletion  Upper Arm Region  No depletion  Thoracic and Lumbar Region  No depletion  Buccal Region  No depletion  Temple Region  No depletion  Clavicle Bone Region  No depletion  Clavicle and Acromion Bone Region  No depletion  Scapular Bone Region  No  depletion  Dorsal Hand  No depletion  Patellar Region  No depletion  Anterior Thigh Region  No depletion  Posterior Calf Region  No depletion  Edema (RD Assessment)  None       Diet Order:   Diet Order            Diet Carb Modified Fluid consistency: Thin; Room service appropriate? Yes  Diet effective now              EDUCATION NEEDS:   Education needs have been addressed  Skin:  Skin Assessment: Skin Integrity Issues: Skin Integrity Issues:: Diabetic Ulcer Diabetic Ulcer: L. foot ulcer  Last BM:  no documented BM  Height:   Ht Readings from Last 1 Encounters:  11/11/18 5\' 10"  (1.778 m)    Weight:   Wt Readings from Last 1 Encounters:  11/11/18 93 kg    Ideal Body Weight:  68.2 kg  BMI:  Body mass index is 29.41 kg/m.  Estimated Nutritional Needs:   Kcal:  1800-2000 kcals   Protein:  90-100 g   Fluid:  >/= 1.8 L    Kerman Passey MS, RD, LDN, CNSC [phone] Pager  [phone] Weekend/On-Call Pager

## 2018-11-13 ENCOUNTER — Other Ambulatory Visit: Payer: Self-pay

## 2018-11-13 ENCOUNTER — Inpatient Hospital Stay (HOSPITAL_COMMUNITY): Payer: Medicaid Other

## 2018-11-13 DIAGNOSIS — I255 Ischemic cardiomyopathy: Secondary | ICD-10-CM | POA: Diagnosis present

## 2018-11-13 DIAGNOSIS — I361 Nonrheumatic tricuspid (valve) insufficiency: Secondary | ICD-10-CM

## 2018-11-13 DIAGNOSIS — I370 Nonrheumatic pulmonary valve stenosis: Secondary | ICD-10-CM

## 2018-11-13 DIAGNOSIS — I2510 Atherosclerotic heart disease of native coronary artery without angina pectoris: Secondary | ICD-10-CM

## 2018-11-13 DIAGNOSIS — I340 Nonrheumatic mitral (valve) insufficiency: Secondary | ICD-10-CM

## 2018-11-13 DIAGNOSIS — I420 Dilated cardiomyopathy: Secondary | ICD-10-CM | POA: Diagnosis present

## 2018-11-13 DIAGNOSIS — I241 Dressler's syndrome: Secondary | ICD-10-CM | POA: Clinically undetermined

## 2018-11-13 HISTORY — DX: Atherosclerotic heart disease of native coronary artery without angina pectoris: I25.10

## 2018-11-13 LAB — BASIC METABOLIC PANEL
Anion gap: 11 (ref 5–15)
BUN: 18 mg/dL (ref 6–20)
CO2: 23 mmol/L (ref 22–32)
Calcium: 8.7 mg/dL — ABNORMAL LOW (ref 8.9–10.3)
Chloride: 100 mmol/L (ref 98–111)
Creatinine, Ser: 1.29 mg/dL — ABNORMAL HIGH (ref 0.44–1.00)
GFR calc Af Amer: >60 mL/min (ref >60)
GFR calc non Af Amer: 52 mL/min — ABNORMAL LOW (ref >60)
GLUCOSE: 276 mg/dL — AB (ref 70–99)
Potassium: 3.8 mmol/L (ref 3.5–5.1)
Sodium: 134 mmol/L — ABNORMAL LOW (ref 135–145)

## 2018-11-13 LAB — ECHOCARDIOGRAM COMPLETE
Height: 70 in
Weight: 3280 oz

## 2018-11-13 LAB — GLUCOSE, CAPILLARY
Glucose-Capillary: 242 mg/dL — ABNORMAL HIGH (ref 70–99)
Glucose-Capillary: 264 mg/dL — ABNORMAL HIGH (ref 70–99)
Glucose-Capillary: 206 mg/dL — ABNORMAL HIGH (ref 70–99)
Glucose-Capillary: 246 mg/dL — ABNORMAL HIGH (ref 70–99)

## 2018-11-13 LAB — CBC
HCT: 27.2 % — ABNORMAL LOW (ref 36.0–46.0)
Hemoglobin: 9.2 g/dL — ABNORMAL LOW (ref 12.0–15.0)
MCH: 30.3 pg (ref 26.0–34.0)
MCHC: 33.8 g/dL (ref 30.0–36.0)
MCV: 89.5 fL (ref 80.0–100.0)
Platelets: 195 10*3/uL (ref 150–400)
RBC: 3.04 MIL/uL — ABNORMAL LOW (ref 3.87–5.11)
RDW: 12.5 % (ref 11.5–15.5)
WBC: 8.2 10*3/uL (ref 4.0–10.5)
nRBC: 0 % (ref 0.0–0.2)

## 2018-11-13 MED ORDER — INSULIN ASPART 100 UNIT/ML ~~LOC~~ SOLN
0.0000 [IU] | Freq: Every day | SUBCUTANEOUS | Status: DC
  Administered 2018-11-13 – 2018-11-15 (×2): 2 [IU] via SUBCUTANEOUS

## 2018-11-13 MED ORDER — INSULIN ASPART 100 UNIT/ML ~~LOC~~ SOLN
0.0000 [IU] | Freq: Three times a day (TID) | SUBCUTANEOUS | Status: DC
  Administered 2018-11-13: 11 [IU] via SUBCUTANEOUS
  Administered 2018-11-13 – 2018-11-14 (×2): 7 [IU] via SUBCUTANEOUS
  Administered 2018-11-14 (×2): 4 [IU] via SUBCUTANEOUS
  Administered 2018-11-15: 7 [IU] via SUBCUTANEOUS

## 2018-11-13 MED ORDER — INSULIN GLARGINE 100 UNIT/ML ~~LOC~~ SOLN
70.0000 [IU] | Freq: Every day | SUBCUTANEOUS | Status: DC
  Administered 2018-11-13 – 2018-11-14 (×2): 70 [IU] via SUBCUTANEOUS
  Filled 2018-11-13 (×3): qty 0.7

## 2018-11-13 MED ORDER — CARVEDILOL 6.25 MG PO TABS
6.2500 mg | ORAL_TABLET | Freq: Two times a day (BID) | ORAL | Status: DC
  Administered 2018-11-13 – 2018-11-14 (×2): 6.25 mg via ORAL
  Filled 2018-11-13 (×2): qty 1

## 2018-11-13 MED ORDER — LOSARTAN POTASSIUM 25 MG PO TABS
25.0000 mg | ORAL_TABLET | Freq: Every day | ORAL | Status: DC
  Administered 2018-11-13 – 2018-11-14 (×2): 25 mg via ORAL
  Filled 2018-11-13 (×2): qty 1

## 2018-11-13 MED ORDER — PERFLUTREN LIPID MICROSPHERE
1.0000 mL | INTRAVENOUS | Status: AC | PRN
  Administered 2018-11-13: 2 mL via INTRAVENOUS
  Filled 2018-11-13: qty 10

## 2018-11-13 MED ORDER — METFORMIN HCL 500 MG PO TABS
500.0000 mg | ORAL_TABLET | Freq: Two times a day (BID) | ORAL | Status: DC
  Administered 2018-11-14 – 2018-11-15 (×3): 500 mg via ORAL
  Filled 2018-11-13 (×3): qty 1

## 2018-11-13 NOTE — Progress Notes (Signed)
  Echocardiogram 2D Echocardiogram has been performed.  Anne [name] F 11/13/2018, 9:38 AM

## 2018-11-13 NOTE — Plan of Care (Signed)
  Problem: Education: Goal: Understanding of CV disease, CV risk reduction, and recovery process will improve Outcome: Progressing   Problem: Cardiovascular: Goal: Ability to achieve and maintain adequate cardiovascular perfusion will improve Outcome: Progressing Goal: Vascular access site(s) Level 0-1 will be maintained Outcome: Progressing   Problem: Education: Goal: Understanding of cardiac disease, CV risk reduction, and recovery process will improve Outcome: Progressing Goal: Understanding of medication regimen will improve Outcome: Progressing   Problem: Cardiac: Goal: Vascular access site(s) Level 0-1 will be maintained Outcome: Progressing

## 2018-11-13 NOTE — Progress Notes (Signed)
Progress Note  Patient Name: Anne [name] Date of Encounter: 11/13/2018  Primary Cardiologist: Dorris Carnes, MD   Subjective   CP improving (increased with inspiration); no dyspnea  Inpatient Medications    Scheduled Meds: . aspirin  81 mg Oral Daily  . atorvastatin  80 mg Oral q1800  . carvedilol  3.125 mg Oral BID WC  . Chlorhexidine Gluconate Cloth  6 each Topical Q0600  . colchicine  0.6 mg Oral BID  . enoxaparin  40 mg Subcutaneous Q24H  . insulin aspart  0-9 Units Subcutaneous TID WC  . morphine  4 mg Intravenous Once  . mupirocin ointment  1 application Nasal BID  . sodium chloride flush  3 mL Intravenous Q12H  . ticagrelor  90 mg Oral BID   Continuous Infusions: . sodium chloride    . sodium chloride 10 mL/hr at 11/13/18 0600   PRN Meds: sodium chloride, sodium chloride, acetaminophen, morphine injection, ondansetron (ZOFRAN) IV, ondansetron, sodium chloride flush   Vital Signs    Vitals:   11/13/18 0500 11/13/18 0700 11/13/18 0800 11/13/18 0845  BP: 115/88     Pulse: (!) 107 (!) 114 (!) 113 (!) 114  Resp: 13 (!) 22 (!) 27 (!) 21  Temp:  98.3 F (36.8 C)    TempSrc:  Oral    SpO2: 96% 100% 99% 98%  Weight:      Height:        Intake/Output Summary (Last 24 hours) at 11/13/2018 0905 Last data filed at 11/13/2018 0800 Gross per 24 hour  Intake 320.5 ml  Output 2700 ml  Net -2379.5 ml   Filed Weights   11/11/18 1249  Weight: 93 kg    Telemetry    Sinus tachycardia - Personally Reviewed  Physical Exam   GEN: No acute distress. WD obese   Neck: No JVD, supple Cardiac: Tachycardic Respiratory: CTA GI: Soft, NT/ND MS: No edema Neuro:  Grossly intact   Labs    Chemistry Recent Labs  Lab 11/11/18 1302 11/12/18 0246 11/13/18 0257  NA 133* 134* 134*  K 3.6 4.7 3.8  CL 98 98 100  CO2 24 23 23   GLUCOSE 213* 278* 276*  BUN 16 15 18   CREATININE 1.08* 1.35* 1.29*  CALCIUM 9.2 8.7* 8.7*  PROT 7.9  --   --   ALBUMIN 4.1  --   --    AST 52*  --   --   ALT 21  --   --   ALKPHOS 53  --   --   BILITOT 0.6  --   --   GFRNONAA >60 50* 52*  GFRAA >60 58* >60  ANIONGAP 11 13 11      Hematology Recent Labs  Lab 11/11/18 1302 11/12/18 0246 11/13/18 0257  WBC 15.0* 11.7* 8.2  RBC 4.01 3.29* 3.04*  HGB 12.1 10.2* 9.2*  HCT 35.7* 29.5* 27.2*  MCV 89.0 89.7 89.5  MCH 30.2 31.0 30.3  MCHC 33.9 34.6 33.8  RDW 13.0 12.9 12.5  PLT 247 224 195    Cardiac Enzymes Recent Labs  Lab 11/11/18 1302 11/11/18 1917 11/12/18 0246  TROPONINI 19.59* 15.20* 10.95*    Recent Labs  Lab 11/11/18 1305  TROPIPOC 20.93*     BNP Recent Labs  Lab 11/12/18 0246  BNP 500.8*     Radiology    Dg Chest Port 1 View  Result Date: 11/11/2018 CLINICAL DATA:  Left-sided chest pain, shortness of breath EXAM: PORTABLE CHEST 1 VIEW COMPARISON:  None. FINDINGS: The heart size and mediastinal contours are within normal limits. Both lungs are clear. The visualized skeletal structures are unremarkable. IMPRESSION: No active disease. Electronically Signed   By: Kathreen Devoid   On: 11/11/2018 13:15    Patient Profile     37 y.o. female presenting with late anterior lateral myocardial infarction.  Cardiac catheterization revealed an occluded second marginal, 99% mid LAD and ejection fraction 25 to 35%.  Patient had PTCA of LAD.  Complained of chest pain following procedure that is felt to be post MI pericarditis.  Assessment & Plan    1 status post anterior myocardial infarction-patient with late presentation.  Status post PTCA of LAD.  Continue aspirin, [name] and statin.  Increase carvedilol to 6.25 mg twice daily.  Await echocardiogram.  2 post MI pericarditis-patient with residual chest pain following PTCA.  Symptoms are improving this morning.  Continue colchicine.  Await echocardiogram to rule out effusion.    3 ischemic cardiomyopathy-increase carvedilol to 6.25 mg twice daily.  Add losartan 25 mg daily.  Titrate medications as  tolerated by pulse and blood pressure.  Repeat echocardiogram in 3 months.  If ejection fraction less than 35% would need to consider ICD.    4 hypertension-medications as outlined above.  5 hyperlipidemia-continue statin.  6 acute on chronic stage III kidney disease-renal function slightly improved this morning.  Will recheck tomorrow morning.  7 history of osteomyelitis with right amputation of forefoot- Follow-up primary care after discharge.  Transfer to telemetry.  For questions or updates, please contact [place] Please consult [email or web address] for contact info under        Signed, Kirk Ruths, MD  11/13/2018, 9:05 AM

## 2018-11-13 NOTE — Progress Notes (Signed)
Report given to [place]. Pt transported via wheelchair. Family updated.

## 2018-11-14 ENCOUNTER — Other Ambulatory Visit: Payer: Self-pay

## 2018-11-14 ENCOUNTER — Encounter (HOSPITAL_COMMUNITY): Payer: Self-pay | Admitting: Interventional Cardiology

## 2018-11-14 DIAGNOSIS — E785 Hyperlipidemia, unspecified: Secondary | ICD-10-CM

## 2018-11-14 DIAGNOSIS — E1169 Type 2 diabetes mellitus with other specified complication: Secondary | ICD-10-CM

## 2018-11-14 DIAGNOSIS — I241 Dressler's syndrome: Secondary | ICD-10-CM

## 2018-11-14 DIAGNOSIS — I255 Ischemic cardiomyopathy: Secondary | ICD-10-CM

## 2018-11-14 DIAGNOSIS — I25110 Atherosclerotic heart disease of native coronary artery with unstable angina pectoris: Secondary | ICD-10-CM

## 2018-11-14 DIAGNOSIS — Z9119 Patient's noncompliance with other medical treatment and regimen: Secondary | ICD-10-CM

## 2018-11-14 DIAGNOSIS — I10 Essential (primary) hypertension: Secondary | ICD-10-CM

## 2018-11-14 DIAGNOSIS — I420 Dilated cardiomyopathy: Secondary | ICD-10-CM

## 2018-11-14 LAB — BASIC METABOLIC PANEL
ANION GAP: 10 (ref 5–15)
BUN: 16 mg/dL (ref 6–20)
CO2: 25 mmol/L (ref 22–32)
Calcium: 8.9 mg/dL (ref 8.9–10.3)
Chloride: 101 mmol/L (ref 98–111)
Creatinine, Ser: 1.32 mg/dL — ABNORMAL HIGH (ref 0.44–1.00)
GFR calc Af Amer: 59 mL/min — ABNORMAL LOW (ref >60)
GFR calc non Af Amer: 51 mL/min — ABNORMAL LOW (ref >60)
Glucose, Bld: 212 mg/dL — ABNORMAL HIGH (ref 70–99)
Potassium: 3.8 mmol/L (ref 3.5–5.1)
Sodium: 136 mmol/L (ref 135–145)

## 2018-11-14 LAB — GLUCOSE, CAPILLARY
GLUCOSE-CAPILLARY: 178 mg/dL — AB (ref 70–99)
Glucose-Capillary: 192 mg/dL — ABNORMAL HIGH (ref 70–99)
Glucose-Capillary: 219 mg/dL — ABNORMAL HIGH (ref 70–99)
Glucose-Capillary: 233 mg/dL — ABNORMAL HIGH (ref 70–99)

## 2018-11-14 MED ORDER — PREDNISONE 20 MG PO TABS: 30.0000 mg | ORAL_TABLET | Freq: Every day | ORAL | Status: DC

## 2018-11-14 MED ORDER — PREDNISONE 20 MG PO TABS
40.0000 mg | ORAL_TABLET | Freq: Every day | ORAL | Status: AC
  Administered 2018-11-14 – 2018-11-15 (×2): 40 mg via ORAL
  Filled 2018-11-14 (×2): qty 2

## 2018-11-14 MED ORDER — CARVEDILOL 6.25 MG PO TABS
6.2500 mg | ORAL_TABLET | Freq: Once | ORAL | Status: AC
  Administered 2018-11-14: 6.25 mg via ORAL
  Filled 2018-11-14: qty 1

## 2018-11-14 MED ORDER — PREDNISONE 5 MG PO TABS: 5.0000 mg | ORAL_TABLET | Freq: Every day | ORAL | Status: DC

## 2018-11-14 MED ORDER — GABAPENTIN 300 MG PO CAPS
300.0000 mg | ORAL_CAPSULE | Freq: Three times a day (TID) | ORAL | Status: DC
  Administered 2018-11-14 – 2018-11-15 (×4): 300 mg via ORAL
  Filled 2018-11-14 (×4): qty 1

## 2018-11-14 MED ORDER — SERTRALINE HCL 50 MG PO TABS
150.0000 mg | ORAL_TABLET | Freq: Every day | ORAL | Status: DC
  Administered 2018-11-14 – 2018-11-15 (×2): 150 mg via ORAL
  Filled 2018-11-14 (×2): qty 1

## 2018-11-14 MED ORDER — PREDNISONE 20 MG PO TABS: 20.0000 mg | ORAL_TABLET | Freq: Every day | ORAL | Status: DC

## 2018-11-14 MED ORDER — PREDNISONE 10 MG PO TABS: 10.0000 mg | ORAL_TABLET | Freq: Every day | ORAL | Status: DC

## 2018-11-14 MED ORDER — COLCHICINE 0.6 MG PO TABS: 0.6000 mg | ORAL_TABLET | Freq: Two times a day (BID) | ORAL | 2 refills | Status: DC

## 2018-11-14 MED ORDER — METOCLOPRAMIDE HCL 5 MG PO TABS
5.0000 mg | ORAL_TABLET | Freq: Two times a day (BID) | ORAL | Status: DC
  Administered 2018-11-14 – 2018-11-15 (×3): 5 mg via ORAL
  Filled 2018-11-14 (×3): qty 1

## 2018-11-14 MED ORDER — SACUBITRIL-VALSARTAN 24-26 MG PO TABS
1.0000 | ORAL_TABLET | Freq: Two times a day (BID) | ORAL | Status: DC
  Administered 2018-11-14 – 2018-11-15 (×2): 1 via ORAL
  Filled 2018-11-14 (×2): qty 1

## 2018-11-14 MED ORDER — OXYCODONE HCL 5 MG PO TABS
5.0000 mg | ORAL_TABLET | ORAL | Status: DC | PRN
  Administered 2018-11-14 – 2018-11-15 (×5): 5 mg via ORAL
  Filled 2018-11-14 (×5): qty 1

## 2018-11-14 MED ORDER — CARVEDILOL 12.5 MG PO TABS
12.5000 mg | ORAL_TABLET | Freq: Two times a day (BID) | ORAL | Status: DC
  Administered 2018-11-14 – 2018-11-15 (×2): 12.5 mg via ORAL
  Filled 2018-11-14 (×2): qty 1

## 2018-11-14 MED FILL — Heparin Sod (Porcine)-NaCl IV Soln 1000 Unit/500ML-0.9%: INTRAVENOUS | Qty: 1000 | Status: AC

## 2018-11-14 NOTE — Progress Notes (Addendum)
Progress Note  Patient Name: Anne [name] Date of Encounter: 11/14/2018  Primary Cardiologist: Dorris Carnes, MD   Subjective   L upper chest pain with deep breath is improving. Intermittent dizziness and leg give out.   Inpatient Medications    Scheduled Meds: . aspirin  81 mg Oral Daily  . atorvastatin  80 mg Oral q1800  . carvedilol  6.25 mg Oral BID WC  . Chlorhexidine Gluconate Cloth  6 each Topical Q0600  . colchicine  0.6 mg Oral BID  . enoxaparin  40 mg Subcutaneous Q24H  . insulin aspart  0-20 Units Subcutaneous TID WC  . insulin aspart  0-5 Units Subcutaneous QHS  . insulin glargine  70 Units Subcutaneous QHS  . losartan  25 mg Oral Daily  . metFORMIN  500 mg Oral BID WC  . morphine  4 mg Intravenous Once  . mupirocin ointment  1 application Nasal BID  . sodium chloride flush  3 mL Intravenous Q12H  . ticagrelor  90 mg Oral BID   Continuous Infusions: . sodium chloride    . sodium chloride 10 mL/hr at 11/13/18 0600   PRN Meds: sodium chloride, sodium chloride, acetaminophen, morphine injection, ondansetron (ZOFRAN) IV, ondansetron, sodium chloride flush   Vital Signs    Vitals:   11/13/18 1300 11/13/18 1400 11/13/18 2047 11/14/18 0504  BP: 120/82  116/81 (!) 137/95  Pulse:   (!) 101 (!) 104  Resp: 18 18    Temp:   98.1 F (36.7 C) 98 F (36.7 C)  TempSrc:   Oral Oral  SpO2:   100% 96%  Weight:    93.7 kg  Height:        Intake/Output Summary (Last 24 hours) at 11/14/2018 0807 Last data filed at 11/13/2018 2303 Gross per 24 hour  Intake 360 ml  Output 575 ml  Net -215 ml   Filed Weights   11/11/18 1249 11/14/18 0504  Weight: 93 kg 93.7 kg    Telemetry    Sinus tachycardia at 100-110s - Personally Reviewed  ECG    Sinus tachy at 104 BPM, diffuse ST elevation with PR depression  - Personally Reviewed  Physical Exam   GEN: No acute distress.   Neck: No JVD Cardiac: RRR, no murmurs, rubs, or gallops.  Respiratory: Clear to  auscultation bilaterally. GI: Soft, nontender, non-distended  MS: No edema; No deformity. Neuro:  Nonfocal  Psych: Normal affect   Labs    Chemistry Recent Labs  Lab 11/11/18 1302 11/12/18 0246 11/13/18 0257 11/14/18 0419  NA 133* 134* 134* 136  K 3.6 4.7 3.8 3.8  CL 98 98 100 101  CO2 24 23 23 25   GLUCOSE 213* 278* 276* 212*  BUN 16 15 18 16   CREATININE 1.08* 1.35* 1.29* 1.32*  CALCIUM 9.2 8.7* 8.7* 8.9  PROT 7.9  --   --   --   ALBUMIN 4.1  --   --   --   AST 52*  --   --   --   ALT 21  --   --   --   ALKPHOS 53  --   --   --   BILITOT 0.6  --   --   --   GFRNONAA >60 50* 52* 51*  GFRAA >60 58* >60 59*  ANIONGAP 11 13 11 10      Hematology Recent Labs  Lab 11/11/18 1302 11/12/18 0246 11/13/18 0257  WBC 15.0* 11.7* 8.2  RBC 4.01 3.29* 3.04*  HGB 12.1 10.2* 9.2*  HCT 35.7* 29.5* 27.2*  MCV 89.0 89.7 89.5  MCH 30.2 31.0 30.3  MCHC 33.9 34.6 33.8  RDW 13.0 12.9 12.5  PLT 247 224 195    Cardiac Enzymes Recent Labs  Lab 11/11/18 1302 11/11/18 1917 11/12/18 0246  TROPONINI 19.59* 15.20* 10.95*    Recent Labs  Lab 11/11/18 1305  TROPIPOC 20.93*     BNP Recent Labs  Lab 11/12/18 0246  BNP 500.8*     DDimer No results for input(s): DDIMER in the last 168 hours.   Radiology    No results found.  Cardiac Studies   Echo 11/13/18: Mild LVH. EGF ~30-35% (by my read - closer to 25-30%). Mid-apical septal/anterior & Inferior HK.  Mild MR  LEFT HEART CATH AND CORONARY ANGIOGRAPHY  CORONARY/GRAFT ACUTE MI REVASCULARIZATION 11/11/18   Conclusion   Ost 2nd Mrg to 2nd Mrg lesion is 100% stenosed. Likely chronic occlusion with right to left collaterals.  Mid LAD lesion is 99% stenosed. PTCA with 2.0 mm balloon performed.  Post intervention, there is a 25% residual stenosis.  Dist LM lesion is 25% stenosed.  There is severe left ventricular systolic dysfunction.  The left ventricular ejection fraction is 25-35% by visual estimate.  LV end  diastolic pressure is mildly elevated.  There is no aortic valve stenosis.   Late presenting anterior MI.  Significant LV dysfunction noted.  She will need aggressive therapy for heart failure and would consider a heart failure consult.  Diffuse atherosclerosis, particularly in the LAD and circumflex.  Most severe area of disease was treated with a small balloon.  I do not think the vessel in the mid and distal portion is large enough for either stenting or bypass grafting.  Medical therapy.  Some of her pain may be post MI pericarditis, given the character of the pain.    Diagnostic Dominance: Right      Patient Profile     37 y.o. female with history of Type 1 diabetes since age 60 complicated by gastroparesis, osteomyelitis, history of R forefoot amputation, L leg ulcer, HTN, HLD, CKD I, former tobacco abuse presented with late anterior lateral myocardial infarction.  Cardiac catheterization revealed an occluded second marginal, 99% mid LAD and ejection fraction 25 to 35%.  Patient had PTCA of LAD.  Complained of chest pain following procedure that is felt to be post MI pericarditis.  Assessment & Plan     1 Late presenting nterior myocardial infarction -Troponin peaked at 19.59.  Status post PTCA of LAD.   Continue aspirin, [name]   Increase Coreg to 12.5mg  BID.  High dose statin.  Echo showed reduced LVEF.    2 Post MI pericarditis-patient with residual chest pain following PTCA.  Symptoms are improving,, but still has pain with deep breath. No effusion by echo.  Continue colchicine.     Stop scheduled IV morphine. Start PRN oxycodone.   Not on NSAID due to mild elevated creatinine from baseline. -- will start short taper of Steroids (will need to monitor glucose levels)  3 Ischemic cardiomyopathy - EF truthfully 25-30%.   Euvolemic. Net I & O 2.9L negative.; no Lasix requirement. - Spironolactone can be added in OP f/u.   Given tachycardia increase BB (Coreg to  12.5 mg BID).   Continue Losartan at current dose. - transition to [facility] (if possible prior to d/c vs. In OP f/u).  Repeat echocardiogram in 3 months = If ejection fraction less than 35% would need to  consider ICD.  Heart failure education given.   Consider Jardiance  4 Hypertension -medications as outlined above.  5 Hyperlipidemia  11/11/2018: Cholesterol 258; HDL 38; LDL Cholesterol 187; Triglycerides 163; VLDL 33  continue statin. - may need PCSK9-I  6. Acute on chronic stage I-III kidney disease - Seems baseline around 1.1. Scr stable here at 1.3. Avoiding NSAIDS. Continue Losartan.   7 History of osteomyelitis with right amputation of forefoot - Complains of leg give out and dizziness. Get PT.   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 Curly [place], [place].  After reviewing all the available data and chart, we discussed the patients laboratory, study & physical findings as well as symptoms in detail. I agree with his findings, examination as well as impression recommendations as per our discussion.    Attending adjustments noted in italics.   Anticipate d/c tomorrow if able to get PT to work with her. Need SW/CM assistance to determine +/- approval for [facility] & then for transition to [facility] as OP.  Will need close PCP f/u for DM med adjustment.  Continue to titrate CAD/ICM meds in OP setting -- 3 month f/u Echo to reassess EF +/- AICD    Glenetta Hew, M.D., M.S. Interventional Cardiologist   Pager # [phone] Phone # [phone] [place], [place] [address]   For questions or updates, please contact [place] Please consult [email or web address] for contact info under

## 2018-11-14 NOTE — Progress Notes (Signed)
Inpatient Diabetes Program Recommendations  AACE/ADA: New Consensus Statement on Inpatient Glycemic Control (2015)  Target Ranges:  Prepandial:   less than 140 mg/dL      Peak postprandial:   less than 180 mg/dL (1-2 hours)      Critically ill patients:  140 - 180 mg/dL   Lab Results  Component Value Date   GLUCAP 178 (H) 11/14/2018   HGBA1C 7.0 (H) 11/12/2018    Review of Glycemic Control Results for Anne [name], Anne [name] (MRN [id number]) as of 11/14/2018 09:25  Ref. Range 11/13/2018 07:16 11/13/2018 12:29 11/13/2018 16:33 11/13/2018 20:45 11/14/2018 07:38  Glucose-Capillary Latest Ref Range: 70 - 99 mg/dL 242 (H) 264 (H) 206 (H) 246 (H) 178 (H)   Diabetes history: DM1 Outpatient Diabetes medications: Lantus 70 units + Novolog sliding scale for meal coverage tid + Metformin 500 mg bid + Bydureon 2 mg qw Current orders for Inpatient glycemic control: Lantus 70 units + Metformin 500 mg bid + Novolog resistant correction tid + hs 0-5 units  Inpatient Diabetes Program Recommendations:   -Add Novolog 5 units tid meal coverage if eats 50%  Thank you, Bethena Roys E. Avi Archuleta, RN, MSN, CDE  Diabetes Coordinator Inpatient Glycemic Control Team Team Pager [phone] (8am-5pm) 11/14/2018 9:27 AM

## 2018-11-14 NOTE — Consult Note (Signed)
  [place] Nurse wound consult note Reason for Consult: Consult requested for left foot wound. Pt is followed by a podiatrist as an outpatient for serial debridement and has been using Santyl.  She expects to be discharged today or tomorrow and does not need another tube of Santyl ordered at this time.  Will plan to substitute Bactroban to wound areas until discharge. Wound type: Left foot with 2 full thickness wounds; outer foot .5X1X.2cm, dry yellow wound bed, no odor, drainage, or fluctuance.  Full thickness wound between 4th and 5th toes 1X.3X.2cm, dry yellow wound bed, no odor, drainage, or fluctuance. Dressing procedure/placement/frequency: Apply Bactroban (already at the bedside) to left foot and toe wounds Q day while in the hospital, then cover with gauze and kerlex. Discussed plan of care with patient and she states she will resume use of Santyl after discharge and will follow-up with her podiatrist. Please re-consult if further assistance is needed.  Thank-you,  Julien Girt MSN, [place], [place], [place], [place]

## 2018-11-14 NOTE — Progress Notes (Signed)
Pt getting ready to walk with PT. Reviewed [name], restrictions, low sodium, NTG and CRPII with pt and family. Gave her HF booklet. Voiced understanding. [place], ACSM 2:26 PM 11/14/2018

## 2018-11-14 NOTE — Care Management (Signed)
1025 11-14-18 Belen Pesch Graves-Bigel[place], RN,BSN [phone] CM received consult for Jardiance or Entresto. Patient has Medicaid, so cost will range from $1.50-$3.00. Pt will need to utilize [facility] once stable to transition home. No further needs identified by CM at this time. Bethena Roys, RN, BSN Case Manager [phone]

## 2018-11-14 NOTE — Evaluation (Signed)
Physical Therapy Evaluation & Discharge Patient Details Name: Anne [name] MRN: [id number] DOB: [birth year] Today's Date: 11/14/2018   History of Present Illness  Pt is a 37 y.o. female admitted 11/11/18 with MI; s/p PTCA of LAD. Post-MI pericarditis, symptoms improving. PMH includes HTN, HLD, CKDIII, ischemic cardiomyopathy, osteomeylitis s/p R forefoot amputation, L foot wound.    Clinical Impression  Patient evaluated by Physical Therapy with no further acute PT needs identified. PTA, pt ambulatory with RW or SPC; family assists with some ADLs, will be available for 24/7 support. Today, pt ambulatory with RW at supervision-level; encouraged use of RW due to bilateral foot impairments. All education has been completed and the patient has no further questions. Owns all necessary DME. Acute PT is signing off. Thank you for this referral.    Follow Up Recommendations No PT follow up;Supervision for mobility/OOB    Equipment Recommendations  None recommended by PT    Recommendations for Other Services       Precautions / Restrictions Precautions Precautions: Fall Precaution Comments: R forefoot amputation Restrictions Weight Bearing Restrictions: No      Mobility  Bed Mobility Overal bed mobility: Independent                Transfers Overall transfer level: Modified independent Equipment used: Rolling walker (2 wheeled)                Ambulation/Gait Ambulation/Gait assistance: Supervision Gait Distance (Feet): 100 Feet Assistive device: Rolling walker (2 wheeled) Gait Pattern/deviations: Step-through pattern;Decreased stride length;Decreased weight shift to left;Antalgic;Trunk flexed Gait velocity: Decreased Gait velocity interpretation: 1.31 - 2.62 ft/sec, indicative of limited community ambulator General Gait Details: Slow, antalgic amb with RW and supervision for safety. Encouraged use of RW (instead of SPC) secondary to bilateral foot  deficits  Stairs            Wheelchair Mobility    Modified Rankin (Stroke Patients Only)       Balance Overall balance assessment: Mild deficits observed, not formally tested                                           Pertinent Vitals/Pain Pain Assessment: No/denies pain    Home Living Family/patient expects to be discharged to:: Private residence Living Arrangements: Spouse/significant other;Children Available Help at Discharge: Family;Available 24 hours/day Type of Home: Mobile home Home Access: Ramped entrance     Home Layout: One level Home Equipment: [place] - 2 wheels;Bedside commode;Shower seat;Cane - single point;Wheelchair - manual      Prior Function Level of Independence: Independent with assistive device(s);Needs assistance   Gait / Transfers Assistance Needed: Ambulates mostly with SPC, sometimes RW  ADL's / Homemaking Assistance Needed: Daughter assist with bathing back and dressing        Hand Dominance        Extremity/Trunk Assessment   Upper Extremity Assessment Upper Extremity Assessment: Overall WFL for tasks assessed    Lower Extremity Assessment Lower Extremity Assessment: Overall WFL for tasks assessed(h/o R forefoot amputation; L foot wound (pt reports since May))       Communication   Communication: No difficulties  Cognition Arousal/Alertness: Awake/alert Behavior During Therapy: WFL for tasks assessed/performed Overall Cognitive Status: Within Functional Limits for tasks assessed  General Comments: WFL for simple tasks      General Comments General comments (skin integrity, edema, etc.): Husband, daughter present    Exercises     Assessment/Plan    PT Assessment [place] does not need any further PT services  PT Problem List         PT Treatment Interventions      PT Goals (Current goals can be found in the Care Plan section)  Acute Rehab PT  Goals PT Goal Formulation: All assessment and education complete, DC therapy    Frequency     Barriers to discharge        Co-evaluation               AM-PAC PT "6 Clicks" Mobility  Outcome Measure Help needed turning from your back to your side while in a flat bed without using bedrails?: None Help needed moving from lying on your back to sitting on the side of a flat bed without using bedrails?: None Help needed moving to and from a bed to a chair (including a wheelchair)?: None Help needed standing up from a chair using your arms (e.g., wheelchair or bedside chair)?: A Little Help needed to walk in hospital room?: A Little Help needed climbing 3-5 steps with a railing? : A Little 6 Click Score: 21    End of Session Equipment Utilized During Treatment: Gait belt Activity Tolerance: Patient tolerated treatment well Patient left: in bed;with call bell/phone within reach;with family/visitor present Nurse Communication: Mobility status PT Visit Diagnosis: Other abnormalities of gait and mobility (R26.89)    Time: 4562-5638 PT Time Calculation (min) (ACUTE ONLY): 13 min   Charges:   PT Evaluation $PT Eval Moderate Complexity: [place], PT, DPT Acute Rehabilitation Services  Pager [phone] Office [place] 11/14/2018, 2:41 PM

## 2018-11-15 ENCOUNTER — Telehealth: Payer: Self-pay | Admitting: Physician Assistant

## 2018-11-15 DIAGNOSIS — Z9861 Coronary angioplasty status: Secondary | ICD-10-CM

## 2018-11-15 LAB — BASIC METABOLIC PANEL
Anion gap: 11 (ref 5–15)
BUN: 18 mg/dL (ref 6–20)
CO2: 24 mmol/L (ref 22–32)
Calcium: 8.7 mg/dL — ABNORMAL LOW (ref 8.9–10.3)
Chloride: 100 mmol/L (ref 98–111)
Creatinine, Ser: 1.36 mg/dL — ABNORMAL HIGH (ref 0.44–1.00)
GFR calc Af Amer: 57 mL/min — ABNORMAL LOW (ref >60)
GFR calc non Af Amer: 49 mL/min — ABNORMAL LOW (ref >60)
Glucose, Bld: 123 mg/dL — ABNORMAL HIGH (ref 70–99)
Potassium: 3.7 mmol/L (ref 3.5–5.1)
Sodium: 135 mmol/L (ref 135–145)

## 2018-11-15 LAB — GLUCOSE, CAPILLARY
Glucose-Capillary: 89 mg/dL (ref 70–99)
Glucose-Capillary: 109 mg/dL — ABNORMAL HIGH (ref 70–99)
Glucose-Capillary: 222 mg/dL — ABNORMAL HIGH (ref 70–99)

## 2018-11-15 MED ORDER — SPIRONOLACTONE 12.5 MG HALF TABLET
12.5000 mg | ORAL_TABLET | Freq: Every day | ORAL | Status: DC
  Administered 2018-11-15: 12.5 mg via ORAL
  Filled 2018-11-15: qty 1

## 2018-11-15 MED ORDER — OXYCODONE-ACETAMINOPHEN 7.5-325 MG PO TABS: 1.0000 | ORAL_TABLET | Freq: Four times a day (QID) | ORAL | 0 refills | Status: AC | PRN

## 2018-11-15 MED ORDER — CARVEDILOL 12.5 MG PO TABS: 12.5000 mg | ORAL_TABLET | Freq: Two times a day (BID) | ORAL | 6 refills | Status: DC

## 2018-11-15 MED ORDER — NITROGLYCERIN 0.4 MG SL SUBL: 0.4000 mg | SUBLINGUAL_TABLET | SUBLINGUAL | 12 refills | Status: DC | PRN

## 2018-11-15 MED ORDER — SACUBITRIL-VALSARTAN 24-26 MG PO TABS: 1.0000 | ORAL_TABLET | Freq: Two times a day (BID) | ORAL | 6 refills | Status: DC

## 2018-11-15 MED ORDER — ASPIRIN EC 81 MG PO TBEC: 81.0000 mg | DELAYED_RELEASE_TABLET | Freq: Every day | ORAL | 3 refills | Status: DC

## 2018-11-15 MED ORDER — SPIRONOLACTONE 25 MG PO TABS: 12.5000 mg | ORAL_TABLET | Freq: Every day | ORAL | 6 refills | Status: DC

## 2018-11-15 MED ORDER — PREDNISONE 10 MG PO TABS: ORAL_TABLET | ORAL | 0 refills | Status: DC

## 2018-11-15 MED ORDER — ATORVASTATIN CALCIUM 80 MG PO TABS: 80.0000 mg | ORAL_TABLET | Freq: Every day | ORAL | 6 refills | Status: DC

## 2018-11-15 MED ORDER — TICAGRELOR 90 MG PO TABS: 90.0000 mg | ORAL_TABLET | Freq: Two times a day (BID) | ORAL | 11 refills | Status: DC

## 2018-11-15 MED FILL — NITROGLYCERIN 0.4 MG TAB SL: 0.4 | 8 days supply | Qty: 25 | Fill #0

## 2018-11-15 MED FILL — ASPIRIN LOW DOSE 81 MG TBEC: 81 | 90 days supply | Qty: 90 | Fill #0

## 2018-11-15 MED FILL — CARVEDILOL 12.5 MG TABLET: 12.5 | 30 days supply | Qty: 60 | Fill #0

## 2018-11-15 MED FILL — OXYCODON-ACETAMINOPHEN 7.5-: 7.5-325 | 7 days supply | Qty: 30 | Fill #0

## 2018-11-15 MED FILL — ENTRESTO 24 MG-26 MG TABLET: 24-26 | 30 days supply | Qty: 60 | Fill #0

## 2018-11-15 MED FILL — ATORVASTATIN CALCIUM 80 MG: 80 | 30 days supply | Qty: 30 | Fill #0

## 2018-11-15 MED FILL — SPIRONOLACTONE 25 MG TABLET: 25 | 30 days supply | Qty: 15 | Fill #0

## 2018-11-15 MED FILL — BRILINTA 90 MG TABLET: 90 | 30 days supply | Qty: 60 | Fill #0

## 2018-11-15 MED FILL — predniSONE 10 MG TABS: 10 | 8 days supply | Qty: 11 | Fill #0

## 2018-11-15 NOTE — Telephone Encounter (Signed)
Pt is being discharged from the hospital sometime today.  Pt to have TOC call placed by nursing, on the next working business day (11/17/18).

## 2018-11-15 NOTE — Progress Notes (Signed)
CARDIAC REHAB PHASE I   PRE:  Rate/Rhythm: 98 SR    BP: sitting 116/75    SaO2:   MODE:  Ambulation: 390 ft   POST:  Rate/Rhythm: 103 ST    BP: sitting 122/82     SaO2:   Pt feeling much better. Able to walk long distance in hall with RW. No CP or c/o. Reviewed ed. Pts materials were lost in transferring rooms so gave more. She has a good understanding of her health and is more motivated now. [place], ACSM 11/15/2018 11:16 AM

## 2018-11-15 NOTE — Discharge Summary (Addendum)
Discharge Summary    Patient ID: Anne [name] MRN: [id number]; DOB: 1981-04-13  Admit date: 11/11/2018 Discharge date: 11/15/2018  Primary Care Provider: Alfonse Flavors, MD  Primary Cardiologist: Dorris Carnes, MD   Discharge Diagnoses    Principal Problem:   Acute ST elevation myocardial infarction (STEMI) involving left anterior descending (LAD) coronary artery [facility]) Active Problems:   Type 2 diabetes mellitus with vascular disease ([place])   Hyperlipidemia associated with type 2 diabetes mellitus ([place])   Essential hypertension, benign   Personal history of noncompliance with medical treatment, presenting hazards to health   Coronary artery disease involving native coronary artery of native heart with unstable angina pectoris ([place])   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[place]) -EF 25-35% following Large (delayed presentation) Anterior STEMI   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 Canagliflozin Other (See Comments)    Vaginal yeast infections    Diagnostic Studies/Procedures    Echo 11/13/18 Study Conclusions  - Left ventricle: No mural apical thrombus with definity. septal   apical mid anterior and inferior hypokinesis preserved basal   function The cavity size was moderately dilated. Wall thickness   was increased in a pattern of mild LVH. Systolic function was   moderately to severely reduced. The estimated ejection fraction   was in the range of 30% to 35%. - Mitral valve: There was mild regurgitation. - Atrial septum: No defect or patent foramen ovale was identified.  LEFT HEART CATH AND CORONARY ANGIOGRAPHY  CORONARY/GRAFT ACUTE MI REVASCULARIZATION 11/11/18   Conclusion   Ost 2nd Mrg to 2nd Mrg lesion is 100% stenosed. Likely chronic occlusion with right to left collaterals.  Mid LAD lesion is 99% stenosed. PTCA with 2.0 mm balloon performed.  Post intervention, there is a 25% residual stenosis.  Dist LM lesion is  25% stenosed.  There is severe left ventricular systolic dysfunction.  The left ventricular ejection fraction is 25-35% by visual estimate.  LV end diastolic pressure is mildly elevated.  There is no aortic valve stenosis.  Late presenting anterior MI. Significant LV dysfunction noted. She will need aggressive therapy for heart failure and would consider a heart failure consult.  Diffuse atherosclerosis, particularly in the LAD and circumflex. Most severe area of disease was treated with a small balloon. I do not think the vessel in the mid and distal portion is large enough for either stenting or bypass grafting. Medical therapy. Some of her pain may be post MI pericarditis, given the character of the pain.    Diagnostic Dominance: Right   History of Present Illness     37 y.o. female with history of Type 1 diabetes since age 7 complicated by gastroparesis, osteomyelitis, history of R forefoot amputation, L leg ulcer, HTN, HLD, CKD I, former tobaccoabuse presented with late anterior lateral myocardial infarction.   Hospital Course     Consultants: None  1. Late presenting nterior myocardial infarction  -Troponin peaked at 19.59. Status post PTCA of LAD. Vessel small for stenting or bypass.  Ost 2nd Mrg to 2nd Mrg lesion is 100% stenosed. Likely chronic occlusion with right to left collaterals. Continue aspirin, [name],  Coreg to 12.5mg  BID and high dose statin. Echo showed reduced LVEF.   2 Post MI pericarditis-patient with residual chest pain following PTCA. - Symptoms are improving with treatment but still has pain with deep breath. No effusion by echo. Started on steroid taper wit improved pain along with narcotic. Continue colchicine, PRN oxycodone and taper steroids.  Not on NSAID due to mild elevated creatinine from baseline.  3 Ischemic cardiomyopathy - Echo of 30-35% by echo however Dr. Ellyn Hack feels EF truthfully 25-30%.  Euvolemic. Net I & O 2.9L  negative.; no Lasix requirement. Continue Coreg, Entresto and Spironolactone.  - Repeat echocardiogram in 3 months =If ejection fraction less than 35% would need to consider ICD. Heart failure education given.   4 Hypertension -medications as outlined above.  5 Hyperlipidemia - 11/11/2018: Cholesterol 258; HDL 38; LDL Cholesterol 187; Triglycerides 163; VLDL 33  continue statin. - may need PCSK9-I if no improvement. Repeat labs in 6 weeks.   6. Acute on chronic stage I-III kidney disease - Seems baseline around 1.1. Scr stable here at 1.3. Avoiding NSAIDS. Meds as above.   7 History of osteomyelitis with right amputation of forefoot - Seen by PT. No further evaluation   8 DM-2  - on Insulin. Consider outpatient Jardiance.   Discharge Vitals Blood pressure (!) 137/95, pulse 94, temperature 97.9 F (36.6 C), temperature source Oral, resp. rate 18, height 5\' 10"  (1.778 m), weight 93.2 kg, last menstrual period 10/21/2018, SpO2 99 %.  Filed Weights   11/11/18 1249 11/14/18 0504 11/15/18 0500  Weight: 93 kg 93.7 kg 93.2 kg   GEN:No acute distress.   Neck:No JVD Cardiac:RRR, no murmurs, + rub, or gallops.  Respiratory:Clear to auscultation bilaterally. VZ:DGLO, nontender, non-distended  MS:No edema; No deformity. Neuro:Nonfocal  Psych: Normal affect   Labs & Radiologic Studies    CBC Recent Labs    11/13/18 0257  WBC 8.2  HGB 9.2*  HCT 27.2*  MCV 89.5  PLT 756   Basic Metabolic Panel Recent Labs    11/14/18 0419 11/15/18 0324  NA 136 135  K 3.8 3.7  CL 101 100  CO2 25 24  GLUCOSE 212* 123*  BUN 16 18  CREATININE 1.32* 1.36*  CALCIUM 8.9 8.7*   Dg Chest Port 1 View  Result Date: 11/11/2018 CLINICAL DATA:  Left-sided chest pain, shortness of breath EXAM: PORTABLE CHEST 1 VIEW COMPARISON:  None. FINDINGS: The heart size and mediastinal contours are within normal limits. Both lungs are clear. The visualized skeletal structures are unremarkable.  IMPRESSION: No active disease. Electronically Signed   By: Kathreen Devoid   On: 11/11/2018 13:15   Disposition   Pt is being discharged home today in good condition.  Follow-up Plans & Appointments     Discharge Instructions    Amb Referral to Cardiac Rehabilitation   Complete by:  As directed    Diagnosis:   PTCA STEMI     Diet - low sodium heart healthy   Complete by:  As directed    Discharge instructions   Complete by:  As directed    No driving for 2 weeks. No lifting over 10 lbs for 4 weeks. No sexual activity for 4 weeks. You may not return to work until cleared by your cardiologist. Keep procedure site clean & dry. If you notice increased pain, swelling, bleeding or pus, call/return!  You may shower, but no soaking baths/hot tubs/pools for 1 week.   Increase activity slowly   Complete by:  As directed       Discharge Medications   Allergies as of 11/15/2018      Reactions   Canagliflozin Other (See Comments)   Vaginal yeast infections      Medication List    STOP taking these medications   ACCU-CHEK AVIVA device   HYDROcodone-acetaminophen 5-325 MG tablet Commonly  known as:  NORCO/VICODIN   hydrOXYzine 50 MG tablet Commonly known as:  ATARAX/VISTARIL   valsartan 160 MG tablet Commonly known as:  DIOVAN     TAKE these medications   aspirin EC 81 MG tablet Take 1 tablet (81 mg total) by mouth daily.   atorvastatin 80 MG tablet Commonly known as:  LIPITOR Take 1 tablet (80 mg total) by mouth daily at 6 PM.   BYDUREON 2 MG Srer Generic drug:  Exenatide ER Inject 2 mg into the skin once a week.   carvedilol 12.5 MG tablet Commonly known as:  COREG Take 1 tablet (12.5 mg total) by mouth 2 (two) times daily with a meal.   colchicine 0.6 MG tablet Take 1 tablet (0.6 mg total) by mouth 2 (two) times daily.   gabapentin 300 MG capsule Commonly known as:  NEURONTIN Take 300 mg by mouth 3 (three) times daily as needed.   LANTUS SOLOSTAR 100 UNIT/ML  Solostar Pen Generic drug:  Insulin Glargine INNJECT 50 UNITS S.Q. ONCE DAILY AT 10 P.M. What changed:  See the new instructions.   metFORMIN 500 MG tablet Commonly known as:  GLUCOPHAGE TAKE 1 TABLET BY MOUTH TWICE DAILY WITH MEALS.   metoCLOPramide 10 MG/10ML Soln Commonly known as:  REGLAN Take 10 mg by mouth 3 (three) times daily.   multivitamin with minerals Tabs tablet Take 1 tablet by mouth daily.   nitroGLYCERIN 0.4 MG SL tablet Commonly known as:  NITROSTAT Place 1 tablet (0.4 mg total) under the tongue every 5 (five) minutes as needed.   NOVOLOG FLEXPEN 100 UNIT/ML FlexPen Generic drug:  insulin aspart INJECT 12-18 UNITS S.Q. THREE TIMES DAILY WITH MEALS. What changed:  See the new instructions.   ondansetron 4 MG disintegrating tablet Commonly known as:  ZOFRAN ODT Take 1 tablet (4 mg total) by mouth every 8 (eight) hours as needed for nausea or vomiting.   oxyCODONE-acetaminophen 7.5-325 MG tablet Commonly known as:  PERCOCET Take 1 tablet by mouth every 6 (six) hours as needed for up to 15 days for severe pain.   predniSONE 10 MG tablet Commonly known as:  DELTASONE Take 3 tables (30mg )  for 2 days then 2 tables (20mg ) for 2 days then 1 table (10mg ) for 2 days then 0.5 table (5mg ) for 2 days and stop   sacubitril-valsartan 24-26 MG Commonly known as:  ENTRESTO Take 1 tablet by mouth 2 (two) times daily.   sertraline 100 MG tablet Commonly known as:  ZOLOFT Take 150 mg by mouth daily.   spironolactone 25 MG tablet Commonly known as:  ALDACTONE Take 0.5 tablets (12.5 mg total) by mouth daily.   ticagrelor 90 MG Tabs tablet Commonly known as:  [name] Take 1 tablet (90 mg total) by mouth 2 (two) times daily.        Acute coronary syndrome (MI, NSTEMI, STEMI, etc) this admission?: Yes.     AHA/ACC Clinical Performance & Quality Measures: 1. Aspirin prescribed? - Yes 2. ADP Receptor Inhibitor (Plavix/Clopidogrel, [name]/Ticagrelor or  Effient/Prasugrel) prescribed (includes medically managed patients)? - Yes 3. Beta Blocker prescribed? - Yes 4. High Intensity Statin (Lipitor 40-80mg  or Crestor 20-40mg ) prescribed? - Yes 5. EF assessed during THIS hospitalization? - Yes 6. For EF <40%, was ACEI/ARB prescribed? - Yes 7. For EF <40%, Aldosterone Antagonist (Spironolactone or Eplerenone) prescribed? - Yes 8. Cardiac Rehab Phase II ordered (Included Medically managed Patients)? - Yes     Outstanding Labs/Studies   BMET at follow up  Consider  OP f/u labs 6-8 weeks given statin initiation this admission.  Duration of Discharge Encounter   Greater than 30 minutes including physician time.  Jarrett Soho, PA 11/15/2018, 10:43 AM   I personally saw and evaluated Ms. Coppernoll this morning.  She was feeling much better.  Chest discomfort has much improved with the addition of steroids.  We have adjusted her medications having converted to ARB to [facility], and her adding low-dose spironolactone.  She is on stable dose of beta-blocker now.  I do not think she needs a diuretic at this current instance because she seems euvolemic.  She will definitely need close follow-up.  With EF of 35% by echo and no arrhythmias on telemetry, we did not do LifeVest.    Glenetta Hew, M.D., M.S. Interventional Cardiologist   Pager # [phone] Phone # [phone] [place], [place] [address]

## 2018-11-15 NOTE — Telephone Encounter (Signed)
TOC Patient-   Please call Patient-   Pt has an appointment on 11-29-18 with Richardson Dopp.

## 2018-11-17 NOTE — Telephone Encounter (Signed)
lpmtcb 12/26

## 2018-11-18 NOTE — Telephone Encounter (Signed)
Patient contacted regarding discharge from [facility] on 11/15/2018.  Patient understands to follow up with provider Richardson Dopp on 11/29/18 at 8:45 am at [address] Patient understands discharge instructions? yes Patient understands medications and regiment? yes Patient understands to bring all medications to this visit? yes  Patient was concerned about low BP, so I advised her to only take her morning dose of Carvedilol 12.5 mg and call us on 12/30 with an update of her Bps.  She verbalized understanding.

## 2018-11-18 NOTE — Telephone Encounter (Signed)
lpmtcb 12/27

## 2018-11-28 ENCOUNTER — Encounter: Payer: Self-pay | Admitting: Physician Assistant

## 2018-11-28 DIAGNOSIS — I5022 Chronic systolic (congestive) heart failure: Secondary | ICD-10-CM

## 2018-11-28 HISTORY — DX: Chronic systolic (congestive) heart failure: I50.22

## 2018-11-28 NOTE — Progress Notes (Signed)
Cardiology Office Note:    Date:  11/29/2018   ID:  Anne [name], DOB 1981-09-29, MRN [id number]  PCP:  Anne Flavors, MD  Cardiologist:  Anne Carnes, MD   Electrophysiologist:  None   Referring MD: Anne [name]   Chief Complaint  Patient presents with  . Hospitalization Follow-up    s/p MI >> PCI     History of Present Illness:    Anne [name] is a 38 y.o. female with type 1 diabetes with assoc gastroparesis, hyperlipidemia, hypertension, osteomyelitis with hx of R forefoot amputation, CKD.    She was admitted 12/20-12/24 with a late presentation anterior myocardial infarction.  Cardiac Catheterization demonstrated diffuse disease, particularly in the LAD and LCx.  The most severe area of disease in the LAD was treated with balloon angioplasty.  It was not large enough for stenting or CABG.  Her EF was severely reduced at 25-35.  A follow up echocardiogram demonstrated an EF of 30-35.  Residual disease included a chronically occluded OM2 with R to L collaterals.  Hospital course was complicated by post MI pericarditis.  She was treated with a steroid taper (no NSAIDs due to elevated creatinine) and colchicine.    Anne [name] returns for posthospitalization follow-up.  She is here with her daughter and mother-in-law.  Since discharge, she has had a few episodes where she was awoken in the middle of the night with palpitations and chest discomfort.  This is not like her anginal symptoms.  She sometimes feels short of breath with this and is sweaty.  She has chronic nausea from gastroparesis.  She denies syncope.  She denies orthopnea.  She denies any lower extremity swelling.  Her weights have been fairly stable.  She has noticed some wheezing.  Her blood pressure was running low post discharge and she was told to change her Coreg to 12.5 mg once daily.   Prior CV studies:   The following studies were reviewed today:  Echo 11/13/18 No mural apical thrombus,  septal, apical mid ant and inf HK; mild LVH, EF 30-35, mild MR  Cardiac Catheterization 11/11/18 LM dist 25 LAD mid 99 OM2 ost 100 - CTO; OM2 filled by collats from post atrio RCA irregs EF 25-35 PCI:  POBA to mid LAD      Echo 11/28/15 EF 55, no RWMA, trivial eff   Past Medical History:  Diagnosis Date  . Acid reflux   . Anxiety   . Arthritis   . 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 CAD (coronary artery disease) 11/13/2018   Late presentation anterior MI 12/19 >> LHC - dLM 25, mLAD 99, oOM2 100 (R-L collats), irreg RCA, EF 25-35 >> PCI: POBA to mLAD  . Chronic systolic CHF (congestive heart failure) ([place]) 11/28/2018   Ischemic CM // late presentation ant MI 10/2018 tx with POBA to LAD (residual CAD with CTO of the OM2) // Echo 12/19:  No mural apical thrombus, septal, apical mid ant and inf HK; mild LVH, EF 30-35, mild MR  . CKD (chronic kidney disease), stage I   . Diabetes mellitus type 1 ([place])   . Former tobacco use   . Gastroparesis   . Hypercholesteremia   . Hypertension   . Osteomyelitis ([place])    a. s/p R forefoot amputation.  . Renal failure   . Sciatica    Surgical Hx: The patient  has a past surgical history that includes Appendectomy;  Cesarean section (2004 and 2007); Amputation (Right, 11/03/2011); Tubal ligation; Coronary/Graft Acute MI Revascularization (N/A, 11/11/2018); and LEFT HEART CATH AND CORONARY ANGIOGRAPHY (N/A, 11/11/2018).   Current Medications: Current Meds  Medication Sig  . aspirin EC 81 MG tablet Take 1 tablet (81 mg total) by mouth daily.  Marland Kitchen atorvastatin (LIPITOR) 80 MG tablet Take 1 tablet (80 mg total) by mouth daily at 6 PM.  . colchicine 0.6 MG tablet Take 1 tablet (0.6 mg total) by mouth 2 (two) times daily.  . Exenatide ER (BYDUREON) 2 MG SRER Inject 2 mg into the skin once a week.  . gabapentin (NEURONTIN) 300 MG capsule Take 300 mg by mouth 3 (three) times daily as needed.  Marland Kitchen LANTUS SOLOSTAR 100 UNIT/ML  Solostar Pen INNJECT 50 UNITS S.Q. ONCE DAILY AT 10 P.M.  . metFORMIN (GLUCOPHAGE) 500 MG tablet TAKE 1 TABLET BY MOUTH TWICE DAILY WITH MEALS.  Marland Kitchen metoCLOPramide (REGLAN) 10 MG/10ML SOLN Take 10 mg by mouth 3 (three) times daily.   . Multiple Vitamin (MULTIVITAMIN WITH MINERALS) TABS tablet Take 1 tablet by mouth daily.  . nitroGLYCERIN (NITROSTAT) 0.4 MG SL tablet Place 1 tablet (0.4 mg total) under the tongue every 5 (five) minutes as needed.  Marland Kitchen NOVOLOG FLEXPEN 100 UNIT/ML FlexPen INJECT 12-18 UNITS S.Q. THREE TIMES DAILY WITH MEALS.  Marland Kitchen ondansetron (ZOFRAN ODT) 4 MG disintegrating tablet Take 1 tablet (4 mg total) by mouth every 8 (eight) hours as needed for nausea or vomiting.  Marland Kitchen oxyCODONE-acetaminophen (PERCOCET) 7.5-325 MG tablet Take 1 tablet by mouth every 6 (six) hours as needed for up to 15 days for severe pain.  . predniSONE (DELTASONE) 10 MG tablet Take 3 tables (30mg )  for 2 days then 2 tables (20mg ) for 2 days then 1 table (10mg ) for 2 days then 0.5 table (5mg ) for 2 days and stop  . sacubitril-valsartan (ENTRESTO) 24-26 MG Take 1 tablet by mouth 2 (two) times daily.  . sertraline (ZOLOFT) 100 MG tablet Take 150 mg by mouth daily.   Marland Kitchen spironolactone (ALDACTONE) 25 MG tablet Take 0.5 tablets (12.5 mg total) by mouth daily.  . ticagrelor ([name]) 90 MG TABS tablet Take 1 tablet (90 mg total) by mouth 2 (two) times daily.  . [DISCONTINUED] carvedilol (COREG) 12.5 MG tablet Take 1 tablet (12.5 mg total) by mouth 2 (two) times daily with a meal.     Allergies:   Canagliflozin and Nsaids   Social History   Tobacco Use  . Smoking status: Former Smoker    Packs/day: 0.25    Years: 20.00    Pack years: 5.00    Types: Cigarettes    Last attempt to quit: 12/29/2013    Years since quitting: 4.9  . Smokeless tobacco: Never Used  Substance Use Topics  . Alcohol use: No  . Drug use: No     Family Hx: The patient's family history includes Alcoholism in her father; Anemia in her  mother; Asthma in her mother; Diabetes in her father, paternal grandfather, and paternal grandmother; Heart attack in her paternal grandmother; Hypertension in her mother; Kidney disease in her mother; Lung cancer in her father. There is no history of Anesthesia problems, Hypotension, Malignant hyperthermia, or Pseudochol deficiency.  ROS:   Please see the history of present illness.    Review of Systems  Eyes: Positive for visual disturbance.  Cardiovascular: Positive for chest pain and dyspnea on exertion.  Respiratory: Positive for wheezing.   Hematologic/Lymphatic: Bruises/bleeds easily.  Musculoskeletal: Positive for back  pain and myalgias.  Gastrointestinal: Positive for nausea.  Neurological: Positive for dizziness, headaches and loss of balance.  Psychiatric/Behavioral: Positive for depression. The patient is nervous/anxious.    All other systems reviewed and are negative.   EKGs/Labs/Other Test Reviewed:    EKG:  EKG is  ordered today.  The ekg ordered today demonstrates normal sinus rhythm, heart rate 90, leftward axis, anterolateral Q waves, T wave inversions V3-V6, QTC 442  Recent Labs: 11/11/2018: ALT 21 11/12/2018: B Natriuretic Peptide 500.8 11/13/2018: Hemoglobin 9.2; Platelets 195 11/15/2018: BUN 18; Creatinine, Ser 1.36; Potassium 3.7; Sodium 135   Recent Lipid Panel Lab Results  Component Value Date/Time   CHOL 258 (H) 11/11/2018 01:04 PM   TRIG 163 (H) 11/11/2018 01:04 PM   HDL 38 (L) 11/11/2018 01:04 PM   CHOLHDL 6.8 11/11/2018 01:04 PM   LDLCALC 187 (H) 11/11/2018 01:04 PM   LDLDIRECT 98 02/10/2012 12:30 PM    Physical Exam:    VS:  BP 110/62   Pulse 90   Ht 5\' 10"  (1.778 m)   Wt 211 lb 6.4 oz (95.9 kg)   SpO2 99%   BMI 30.33 kg/m     Wt Readings from Last 3 Encounters:  11/29/18 211 lb 6.4 oz (95.9 kg)  11/15/18 205 lb 8 oz (93.2 kg)  10/12/18 207 lb (93.9 kg)     Physical Exam  Constitutional: She is oriented to person, place, and time.  She appears well-developed and well-nourished. No distress.  HENT:  Head: Normocephalic and atraumatic.  Eyes: No scleral icterus.  Neck: No JVD present. No thyromegaly present.  Cardiovascular: Normal rate and regular rhythm.  No murmur heard. Pulmonary/Chest: Effort normal. She has no rales.  Abdominal: Soft. She exhibits no distension.  Musculoskeletal:        General: No edema.     Comments: Right wrist without hematoma  Lymphadenopathy:    She has no cervical adenopathy.  Neurological: She is alert and oriented to person, place, and time.  Skin: Skin is warm and dry.  Psychiatric: She has a normal mood and affect.    ASSESSMENT & PLAN:    Acute ST elevation myocardial infarction (STEMI) involving left anterior descending (LAD) coronary artery (HCC) Status post late presentation anterior myocardial infarction treated with balloon angioplasty to the LAD.  She does have residual disease with a chronically occluded second obtuse marginal and 25% distal left main stenosis.  She has had a few episodes of chest discomfort awakening her from sleep since discharge from the hospital.  ECG is actually improved since she was in the hospital.  Question if she may be describing paroxysmal nocturnal dyspnea.  On exam, she does not appear to be volume overloaded.  She did have some issues with post MI pericarditis.  However, her symptoms seem to be improved.  We discussed the importance of dual antiplatelet therapy with aspirin and [name].  She has had some difficulty with hypotension and had to reduce her carvedilol to 12.5 mg once daily.  She is interested in proceeding with cardiac rehabilitation.  -Continue aspirin, [name], atorvastatin, ARB  -Change carvedilol to 6.25 mg twice daily  -Consider isosorbide at follow-up if blood pressure will tolerate  -Consider changing Coreg to Metoprolol succinate if BP will not tolerate Carvedilol  -She will contact me if she does not hear from cardiac  rehab in the next 2 weeks  -Close follow-up with me or Dr. Harrington Challenger in 2 to 3 weeks  Chronic systolic CHF (congestive heart  failure) ([place]) EF 30-35.  She is NYHA 2.  Volume status appears stable on exam.  Obtain BMET, BNP today given her symptoms of nightly awakening.  If her BNP is significant elevated, I will place her on furosemide.  Continue current dose of Entresto, spironolactone.  Given her hypotension, I will change her carvedilol to 6.25 mg twice daily.  If she cannot tolerate this, I will change her to metoprolol succinate.  Close follow-up in 2 to 3 weeks as noted.  She will need a follow-up echo in late March to reassess EF.  If EF remains <35, refer to EP for ICD.  If she continues to have palpitations at night awakening her from sleep, consider event monitor.   Shortness of breath Her shortness of breath may be related to [name] as well.  Obtain BNP today.  Add furosemide if BNP significant elevated.  If shortness of breath continues, we may need to consider changing [name] to Plavix.   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  Continue colchicine for total of 3 months.  Essential hypertension, benign The patient's blood pressure is controlled on her current regimen.  Continue current therapy.   Hyperlipidemia associated with type 2 diabetes mellitus (HCC) Continue high-dose statin.  Arrange follow-up lipids and LFTs in 6 weeks.    Dispo:  Return in about 2 weeks (around 12/13/2018) for Close Follow Up, w/ Dr. Harrington Challenger, or Richardson Dopp, PA-C.   Medication Adjustments/Labs and Tests Ordered: Current medicines are reviewed at length with the patient today.  Concerns regarding medicines are outlined above.  Tests Ordered: Orders Placed This Encounter  Procedures  . Basic metabolic panel  . Pro b natriuretic peptide (BNP)  . Hepatic function panel  . Lipid panel  . EKG 12-Lead  . ECHOCARDIOGRAM LIMITED   Medication Changes: Meds ordered this encounter  Medications  .  carvedilol (COREG) 6.25 MG tablet    Sig: Take 1 tablet (6.25 mg total) by mouth 2 (two) times daily.    Dispense:  180 tablet    Refill:  3    Signed, Richardson Dopp, PA-C  11/29/2018 [facility] [place], [place], [place]  [address] Phone: [phone]; Fax: [phone]

## 2018-11-29 ENCOUNTER — Encounter: Payer: Self-pay | Admitting: Physician Assistant

## 2018-11-29 ENCOUNTER — Other Ambulatory Visit: Payer: Self-pay | Admitting: Physician Assistant

## 2018-11-29 ENCOUNTER — Ambulatory Visit (INDEPENDENT_AMBULATORY_CARE_PROVIDER_SITE_OTHER): Payer: Medicaid Other | Admitting: Physician Assistant

## 2018-11-29 VITALS — BP 110/62 | HR 90 | Ht 70.0 in | Wt 211.4 lb

## 2018-11-29 DIAGNOSIS — I241 Dressler's syndrome: Secondary | ICD-10-CM | POA: Diagnosis not present

## 2018-11-29 DIAGNOSIS — I5022 Chronic systolic (congestive) heart failure: Secondary | ICD-10-CM | POA: Diagnosis not present

## 2018-11-29 DIAGNOSIS — I2102 ST elevation (STEMI) myocardial infarction involving left anterior descending coronary artery: Secondary | ICD-10-CM | POA: Diagnosis not present

## 2018-11-29 DIAGNOSIS — E785 Hyperlipidemia, unspecified: Secondary | ICD-10-CM

## 2018-11-29 DIAGNOSIS — I10 Essential (primary) hypertension: Secondary | ICD-10-CM

## 2018-11-29 DIAGNOSIS — R0602 Shortness of breath: Secondary | ICD-10-CM | POA: Diagnosis not present

## 2018-11-29 DIAGNOSIS — E1169 Type 2 diabetes mellitus with other specified complication: Secondary | ICD-10-CM

## 2018-11-29 LAB — BASIC METABOLIC PANEL
BUN/Creatinine Ratio: 13 (ref 9–23)
BUN: 15 mg/dL (ref 6–20)
CO2: 21 mmol/L (ref 20–29)
Calcium: 8.9 mg/dL (ref 8.7–10.2)
Chloride: 99 mmol/L (ref 96–106)
Creatinine, Ser: 1.2 mg/dL — ABNORMAL HIGH (ref 0.57–1.00)
GFR calc Af Amer: 66 mL/min/{1.73_m2} (ref >59)
GFR, EST NON AFRICAN AMERICAN: 57 mL/min/{1.73_m2} — AB (ref >59)
Glucose: 149 mg/dL — ABNORMAL HIGH (ref 65–99)
Potassium: 4.3 mmol/L (ref 3.5–5.2)
Sodium: 135 mmol/L (ref 134–144)

## 2018-11-29 LAB — PRO B NATRIURETIC PEPTIDE: NT-Pro BNP: 3367 pg/mL — ABNORMAL HIGH (ref 0–130)

## 2018-11-29 MED ORDER — CARVEDILOL 6.25 MG PO TABS: 6.2500 mg | ORAL_TABLET | Freq: Two times a day (BID) | ORAL | 3 refills | Status: DC

## 2018-11-29 NOTE — Patient Instructions (Signed)
Medication Instructions:  Your physician has recommended you make the following change in your medication:  1. DECREASE COREG TO 6.25 MG TWICE DAILY.  2. CONTINUE COLCHICINE FOR 3 MONTHS AND THEN STOP.  If you need a refill on your cardiac medications before your next appointment, please call your pharmacy.   Lab work: TODAY: BMET, BNP  6-8 WEEKS: LFTS, LIPIDS  If you have labs (blood work) drawn today and your tests are completely normal, you will receive your results only by: Marland Kitchen MyChart Message (if you have MyChart) OR . A paper copy in the mail If you have any lab test that is abnormal or we need to change your treatment, we will call you to review the results.  Testing/Procedures: Your physician has requested that you have an LIMITED echocardiogram. Echocardiography is a painless test that uses sound waves to create images of your heart. It provides your doctor with information about the size and shape of your heart and how well your heart's chambers and valves are working. This procedure takes approximately one hour. There are no restrictions for this procedure. TO BE SCHEDULED ON OR AFTER 02/12/19    Follow-Up: At [facility], you and your health needs are our priority.  As part of our continuing mission to provide you with exceptional heart care, we have created designated Provider Care Teams.  These Care Teams include your primary Cardiologist (physician) and Advanced Practice Providers (APPs -  Physician Assistants and Nurse Practitioners) who all work together to provide you with the care you need, when you need it. You will need a follow up appointment in:  2 - 3 weeks.  Please call our office 2 months in advance to schedule this appointment.  You may see Dorris Carnes, MD or Richardson Dopp, PA-C   Any Other Special Instructions Will Be Listed Below (If Applicable).

## 2018-12-09 ENCOUNTER — Telehealth: Payer: Self-pay

## 2018-12-09 DIAGNOSIS — I5022 Chronic systolic (congestive) heart failure: Secondary | ICD-10-CM

## 2018-12-09 DIAGNOSIS — I2102 ST elevation (STEMI) myocardial infarction involving left anterior descending coronary artery: Secondary | ICD-10-CM

## 2018-12-09 MED ORDER — POTASSIUM CHLORIDE ER 10 MEQ PO TBCR: 10.0000 meq | EXTENDED_RELEASE_TABLET | Freq: Every day | ORAL | 3 refills | Status: DC

## 2018-12-09 MED ORDER — FUROSEMIDE 40 MG PO TABS: 40.0000 mg | ORAL_TABLET | Freq: Every day | ORAL | 3 refills | Status: DC

## 2018-12-09 NOTE — Telephone Encounter (Signed)
-----   Message from Liliane Shi, [place] sent at 11/29/2018  5:43 PM EST ----- Glucose elevated.  Creatinine stable/improved.  K+ normal.  BNP elevated. Recommendations:  - Start Lasix 40 mg QD  - Start K+ 10 mEq QD  - BMET 1 week  - Daily weight and call if weight increases by 3+ lbs in 1 day. Richardson Dopp, PA-C    11/29/2018 5:39 PM

## 2018-12-09 NOTE — Telephone Encounter (Signed)
Reviewed results with pt who verbalized understanding. Per Richardson Dopp, PA-C to have pt start on Lasix 40 mg qd, K+ 10 meq qd, and obtain a bmet in 1 week (lab appt on 1/28). Pt wants meds to go to [facility] with a 90 day supply. Pt is aware to weight daily and call if she has increase weight of 3 lbs in 1 day. Pt thanked me for the call. Orders have been place in epic.

## 2018-12-15 ENCOUNTER — Other Ambulatory Visit: Payer: Self-pay

## 2018-12-15 MED ORDER — TICAGRELOR 90 MG PO TABS: 90.0000 mg | ORAL_TABLET | Freq: Two times a day (BID) | ORAL | 11 refills | Status: DC

## 2018-12-15 MED ORDER — SACUBITRIL-VALSARTAN 24-26 MG PO TABS: 1.0000 | ORAL_TABLET | Freq: Two times a day (BID) | ORAL | 11 refills | Status: DC

## 2018-12-15 NOTE — Telephone Encounter (Signed)
Pt called and stated her medications were supposed to go to [facility] and not [facility].

## 2018-12-20 ENCOUNTER — Other Ambulatory Visit: Payer: Medicaid Other

## 2018-12-21 ENCOUNTER — Ambulatory Visit: Payer: Medicaid Other | Admitting: Physician Assistant

## 2018-12-21 ENCOUNTER — Encounter: Payer: Self-pay | Admitting: Physician Assistant

## 2018-12-21 VITALS — BP 108/72 | HR 93 | Ht 70.0 in | Wt 198.0 lb

## 2018-12-21 DIAGNOSIS — E785 Hyperlipidemia, unspecified: Secondary | ICD-10-CM

## 2018-12-21 DIAGNOSIS — I5022 Chronic systolic (congestive) heart failure: Secondary | ICD-10-CM

## 2018-12-21 DIAGNOSIS — E1169 Type 2 diabetes mellitus with other specified complication: Secondary | ICD-10-CM

## 2018-12-21 DIAGNOSIS — I10 Essential (primary) hypertension: Secondary | ICD-10-CM

## 2018-12-21 DIAGNOSIS — I2510 Atherosclerotic heart disease of native coronary artery without angina pectoris: Secondary | ICD-10-CM

## 2018-12-21 DIAGNOSIS — I241 Dressler's syndrome: Secondary | ICD-10-CM

## 2018-12-21 MED ORDER — CARVEDILOL 6.25 MG PO TABS: 6.2500 mg | ORAL_TABLET | Freq: Two times a day (BID) | ORAL | 3 refills | Status: DC

## 2018-12-21 MED ORDER — LOSARTAN POTASSIUM 25 MG PO TABS: 25.0000 mg | ORAL_TABLET | Freq: Every day | ORAL | 3 refills | Status: DC

## 2018-12-21 NOTE — Progress Notes (Signed)
Cardiology Office Note:    Date:  12/21/2018   ID:  Anne [name], DOB 09/08/1981, MRN [id number]  PCP:  Alfonse Flavors, MD  Cardiologist:  Dorris Carnes, MD  Electrophysiologist:  None   Referring MD: Alfonse Flavors   Chief Complaint  Patient presents with  . Follow-up    CAD, CHF    History of Present Illness:    Anne [name] is a 38 y.o. 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  She was admitted in 10/2018 with a late presentation anterior MI.  Cardiac Catheterization demonstrated diffuse disease, particularly in the LAD and LCx.  The most severe area of disease in the LAD was treated with balloon angioplasty.  It was not large enough for stenting or CABG.  Her EF was severely reduced at 25-35.  A follow up echocardiogram demonstrated an EF of 30-35.  Residual disease included a chronically occluded OM2 with R to L collaterals.  Hospital course was complicated by post MI pericarditis.  She was treated with a steroid taper (no NSAIDs due to elevated creatinine) and colchicine.  She was last seen 11/29/2018 for post hospital follow up.  Her beta-blocker was reduced due to low BP.  BNP was significantly elevated (3,367) and she was started on Furosemide.     Anne [name] returns for follow-up.  She is here today with her family.  She still notes occasional shortness of breath at night but overall, this is better.  Her weight has come down.  She has not had any chest discomfort or orthopnea.  He denies lower extremity swelling or syncope.  She still gets dizzy on occasion especially with standing.  She has had blood pressures in the 80s at home.  Prior CV studies:   The following studies were reviewed today:  Echo 11/13/18 No mural apical thrombus, septal, apical mid ant and inf HK; mild LVH, EF 30-35, mild MR  Cardiac Catheterization 11/11/18 LM  dist 25 LAD mid 99 OM2 ost 100 - CTO; OM2 filled by collats from post atrio RCA irregs EF 25-35 PCI:  POBA to mid LAD      Echo 11/28/15 EF 55, no RWMA, trivial eff  Past Medical History:  Diagnosis Date  . Acid reflux   . Anxiety   . Arthritis   . 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 CAD (coronary artery disease) 11/13/2018   Late presentation anterior MI 12/19 >> LHC - dLM 25, mLAD 99, oOM2 100 (R-L collats), irreg RCA, EF 25-35 >> PCI: POBA to mLAD  . Chronic systolic CHF (congestive heart failure) ([place]) 11/28/2018   Ischemic CM // late presentation ant MI 10/2018 tx with POBA to LAD (residual CAD with CTO of the OM2) // Echo 12/19:  No mural apical thrombus, septal, apical mid ant and inf HK; mild LVH, EF 30-35, mild MR  . CKD (chronic kidney disease), stage I   . Diabetes mellitus type 1 ([place])   . Former tobacco use   . Gastroparesis   . Hypercholesteremia   . Hypertension   . Osteomyelitis ([place])    a. s/p R forefoot amputation.  . Renal failure   . Sciatica    Surgical Hx: The patient  has a past surgical history that includes Appendectomy; Cesarean section (2004 and 2007); Amputation (Right, 11/03/2011); Tubal ligation; Coronary/Graft Acute MI Revascularization (N/A, 11/11/2018);  and LEFT HEART CATH AND CORONARY ANGIOGRAPHY (N/A, 11/11/2018).   Current Medications: Current Meds  Medication Sig  . aspirin EC 81 MG tablet Take 1 tablet (81 mg total) by mouth daily.  Marland Kitchen atorvastatin (LIPITOR) 80 MG tablet Take 1 tablet (80 mg total) by mouth daily at 6 PM.  . carvedilol (COREG) 6.25 MG tablet Take 1 tablet (6.25 mg total) by mouth 2 (two) times daily.  . colchicine 0.6 MG tablet Take 1 tablet (0.6 mg total) by mouth 2 (two) times daily.  . Exenatide ER (BYDUREON) 2 MG SRER Inject 2 mg into the skin once a week.  . furosemide (LASIX) 40 MG tablet Take 1 tablet (40 mg total) by mouth daily.  Marland Kitchen gabapentin (NEURONTIN) 300 MG capsule Take 300 mg  by mouth 3 (three) times daily as needed.  Marland Kitchen LANTUS SOLOSTAR 100 UNIT/ML Solostar Pen INNJECT 50 UNITS S.Q. ONCE DAILY AT 10 P.M.  . metFORMIN (GLUCOPHAGE) 500 MG tablet TAKE 1 TABLET BY MOUTH TWICE DAILY WITH MEALS.  Marland Kitchen metoCLOPramide (REGLAN) 10 MG/10ML SOLN Take 10 mg by mouth 3 (three) times daily.   . Multiple Vitamin (MULTIVITAMIN WITH MINERALS) TABS tablet Take 1 tablet by mouth daily.  . nitroGLYCERIN (NITROSTAT) 0.4 MG SL tablet Place 1 tablet (0.4 mg total) under the tongue every 5 (five) minutes as needed.  Marland Kitchen NOVOLOG FLEXPEN 100 UNIT/ML FlexPen INJECT 12-18 UNITS S.Q. THREE TIMES DAILY WITH MEALS.  Marland Kitchen ondansetron (ZOFRAN ODT) 4 MG disintegrating tablet Take 1 tablet (4 mg total) by mouth every 8 (eight) hours as needed for nausea or vomiting.  . potassium chloride (K-DUR) 10 MEQ tablet Take 1 tablet (10 mEq total) by mouth daily.  . sertraline (ZOLOFT) 100 MG tablet Take 150 mg by mouth daily.   Marland Kitchen spironolactone (ALDACTONE) 25 MG tablet Take 0.5 tablets (12.5 mg total) by mouth daily.  . ticagrelor ([name]) 90 MG TABS tablet Take 1 tablet (90 mg total) by mouth 2 (two) times daily.  . [DISCONTINUED] carvedilol (COREG) 6.25 MG tablet Take 1 tablet (6.25 mg total) by mouth 2 (two) times daily.  . [DISCONTINUED] sacubitril-valsartan (ENTRESTO) 24-26 MG Take 1 tablet by mouth 2 (two) times daily.     Allergies:   Canagliflozin and Nsaids   Social History   Tobacco Use  . Smoking status: Former Smoker    Packs/day: 0.25    Years: 20.00    Pack years: 5.00    Types: Cigarettes    Last attempt to quit: 12/29/2013    Years since quitting: 4.9  . Smokeless tobacco: Never Used  Substance Use Topics  . Alcohol use: No  . Drug use: No     Family Hx: The patient's family history includes Alcoholism in her father; Anemia in her mother; Asthma in her mother; Diabetes in her father, paternal grandfather, and paternal grandmother; Heart attack in her paternal grandmother; Hypertension in  her mother; Kidney disease in her mother; Lung cancer in her father. There is no history of Anesthesia problems, Hypotension, Malignant hyperthermia, or Pseudochol deficiency.  ROS:   Please see the history of present illness.    Review of Systems  Eyes: Positive for visual disturbance.  Cardiovascular: Positive for dyspnea on exertion.  Respiratory: Positive for cough and shortness of breath.   Musculoskeletal: Positive for back pain and myalgias.  Neurological: Positive for dizziness.  Psychiatric/Behavioral: Positive for depression.   All other systems reviewed and are negative.   EKGs/Labs/Other Test Reviewed:    EKG:  EKG  is not ordered today.    Recent Labs: 11/11/2018: ALT 21 11/12/2018: B Natriuretic Peptide 500.8 11/13/2018: Hemoglobin 9.2; Platelets 195 11/29/2018: BUN 15; Creatinine, Ser 1.20; NT-Pro BNP 3,367; Potassium 4.3; Sodium 135   Recent Lipid Panel Lab Results  Component Value Date/Time   CHOL 258 (H) 11/11/2018 01:04 PM   TRIG 163 (H) 11/11/2018 01:04 PM   HDL 38 (L) 11/11/2018 01:04 PM   CHOLHDL 6.8 11/11/2018 01:04 PM   LDLCALC 187 (H) 11/11/2018 01:04 PM   LDLDIRECT 98 02/10/2012 12:30 PM    Physical Exam:    VS:  BP 108/72   Pulse 93   Ht 5\' 10"  (1.778 m)   Wt 198 lb (89.8 kg)   SpO2 99%   BMI 28.41 kg/m     Wt Readings from Last 3 Encounters:  12/21/18 198 lb (89.8 kg)  11/29/18 211 lb 6.4 oz (95.9 kg)  11/15/18 205 lb 8 oz (93.2 kg)     Physical Exam  Constitutional: She is oriented to person, place, and time. She appears well-developed and well-nourished. No distress.  HENT:  Head: Normocephalic and atraumatic.  Eyes: No scleral icterus.  Neck: Neck supple. No thyromegaly present.  Cardiovascular: Normal rate, regular rhythm, S1 normal, S2 normal and normal heart sounds.  No murmur heard. Pulmonary/Chest: Effort normal. She has no rales.  Abdominal: Soft. There is no hepatomegaly.  Musculoskeletal:        [name]: No edema.    Lymphadenopathy:    She has no cervical adenopathy.  Neurological: She is alert and oriented to person, place, and time.  Skin: Skin is warm and dry.  Psychiatric: She has a normal mood and affect.    ASSESSMENT & PLAN:    Coronary artery disease involving native coronary artery of native heart without angina pectoris  Status post late presentation anterior myocardial infarction treated with balloon angioplasty to the LAD.  She does have residual disease with a chronically occluded second obtuse marginal and 25% distal left main stenosis.  She is doing well without anginal symptoms.  She seems to be tolerating her current antiplatelet therapy.  Continue aspirin, Ticagrelor, beta-blocker, atorvastatin.  She has not heard from cardiac rehab.  I will send another referral to cardiac rehab at [facility].  If she continues to have shortness of breath, consider changing Ticagrelor to Clopidogrel.    Chronic systolic CHF (congestive heart failure) (HCC)  EF 30-35.  She is NYHA 2.  Volume status appears stable.  Her weight is come down since starting on furosemide.  She is not tolerating Entresto.  She remains dizzy and has had hypotensive episodes.  Therefore, I will stop her Delene Loll and place her on losartan 25 mg daily.  Obtain follow-up BMET, BNP today.  Repeat a BMP in 1 month.  I asked her to look out for increased weight or swelling with the stopping of Entresto.  If she should notice this, she should call us for possible increase in her Lasix.  She will need a follow-up echo 90 days post PCI.  If her EF remains <35, she will need to see EP for possible ICD.  -DC Entresto   -Start losartan 25 mg daily  -BMET, BNP   -BMET 1 month  -Limited echo in late March 2020  -If EF <35 on echo in March 2020, refer to EP for ICD  -If HR remains > 70 and EF remains < 40, consider Ivabridine  Dressler's syndrome (HCC) No recurrent chest pain.  She should remain on colchicine for total of 3  months.  Essential hypertension As noted, blood pressure is running low at times.  Change Entresto to losartan as noted above.  Hyperlipidemia associated with type 2 diabetes mellitus (HCC)  Continue high-dose statin.  Follow-up lipids and LFTs pending.  Dispo:  Return in about 2 months (around 02/19/2019) for Routine Follow Up, w/ Dr. Harrington Challenger.   Medication Adjustments/Labs and Tests Ordered: Current medicines are reviewed at length with the patient today.  Concerns regarding medicines are outlined above.  Tests Ordered: Orders Placed This Encounter  Procedures  . Basic metabolic panel  . Pro b natriuretic peptide (BNP)  . Basic metabolic panel  . AMB referral to cardiac rehabilitation  . ECHOCARDIOGRAM LIMITED   Medication Changes: Meds ordered this encounter  Medications  . carvedilol (COREG) 6.25 MG tablet    Sig: Take 1 tablet (6.25 mg total) by mouth 2 (two) times daily.    Dispense:  180 tablet    Refill:  3    Please do not fill until patient calls for prescription  . losartan (COZAAR) 25 MG tablet    Sig: Take 1 tablet (25 mg total) by mouth daily.    Dispense:  90 tablet    Refill:  3    Signed, Richardson Dopp, PA-C  12/21/2018 5:19 PM    [facility] [address] [place], [place], [place]  [address] Phone: [phone]; Fax: [phone]

## 2018-12-21 NOTE — Patient Instructions (Addendum)
Medication Instructions:  Your physician has recommended you make the following change in your medication:  1. STOP ENTRESTO  2. START LOSARTAN 25 MG DAILY.  If you need a refill on your cardiac medications before your next appointment, please call your pharmacy.   Lab work: TODAY: BMET, BNP  TO BE DONE IN WITH LIPIDS ON 01/18/19: BMET  If you have labs (blood work) drawn today and your tests are completely normal, you will receive your results only by: Marland Kitchen MyChart Message (if you have MyChart) OR . A paper copy in the mail If you have any lab test that is abnormal or we need to change your treatment, we will call you to review the results.  Testing/Procedures: Your physician has requested that you have an LIMITED echocardiogram. Echocardiography is a painless test that uses sound waves to create images of your heart. It provides your doctor with information about the size and shape of your heart and how well your heart's chambers and valves are working. This procedure takes approximately one hour. There are no restrictions for this procedure. TO BE DO ON OR BEFORE 02/10/19    Follow-Up: At [facility], you and your health needs are our priority.  As part of our continuing mission to provide you with exceptional heart care, we have created designated Provider Care Teams.  These Care Teams include your primary Cardiologist (physician) and Advanced Practice Providers (APPs -  Physician Assistants and Nurse Practitioners) who all work together to provide you with the care you need, when you need it. . You will need a follow up appointment in:  8 WEEKS (1 WEEK AFTER ECHO IS DONE).  P You may see Dorris Carnes, MD  or one of the following Advanced Practice Providers on your designated Care Team: . Richardson Dopp, PA-C . Vin Bhagat, PA-C . Daune Perch, NP  Any Other Special Instructions Will Be Listed Below (If Applicable).

## 2018-12-22 ENCOUNTER — Telehealth: Payer: Self-pay

## 2018-12-22 DIAGNOSIS — I10 Essential (primary) hypertension: Secondary | ICD-10-CM

## 2018-12-22 DIAGNOSIS — I5022 Chronic systolic (congestive) heart failure: Secondary | ICD-10-CM

## 2018-12-22 LAB — BASIC METABOLIC PANEL
BUN/Creatinine Ratio: 13 (ref 9–23)
BUN: 20 mg/dL (ref 6–20)
CO2: 22 mmol/L (ref 20–29)
Calcium: 9.1 mg/dL (ref 8.7–10.2)
Chloride: 95 mmol/L — ABNORMAL LOW (ref 96–106)
Creatinine, Ser: 1.51 mg/dL — ABNORMAL HIGH (ref 0.57–1.00)
GFR calc non Af Amer: 44 mL/min/{1.73_m2} — ABNORMAL LOW (ref >59)
GFR, EST AFRICAN AMERICAN: 50 mL/min/{1.73_m2} — AB (ref >59)
Glucose: 136 mg/dL — ABNORMAL HIGH (ref 65–99)
Potassium: 3.9 mmol/L (ref 3.5–5.2)
Sodium: 137 mmol/L (ref 134–144)

## 2018-12-22 LAB — PRO B NATRIURETIC PEPTIDE: NT-Pro BNP: 2669 pg/mL — ABNORMAL HIGH (ref 0–130)

## 2018-12-22 NOTE — Telephone Encounter (Signed)
-----   Message from Liliane Shi, PA-C sent at 12/22/2018 11:52 AM EST ----- BNP improved.  Creatinine increased some.  K+ normal.   Recommendations:  - Continue current medications and follow up as planned.   - Repeat BMET 2 weeks Richardson Dopp, PA-C    12/22/2018 11:49 AM

## 2018-12-22 NOTE — Telephone Encounter (Signed)
Reviewed results with patient who verbalized understanding. Per Richardson Dopp to have pt come back for repeat BMET (lab appt 2/13) Orders placed in Epic.

## 2019-01-05 ENCOUNTER — Other Ambulatory Visit: Payer: Medicaid Other

## 2019-01-08 ENCOUNTER — Emergency Department (HOSPITAL_COMMUNITY)
Admission: EM | Admit: 2019-01-08 | Discharge: 2019-01-09 | Disposition: A | Discharge status: Home / Self Care | Payer: Medicaid Other | Attending: Emergency Medicine | Admitting: Emergency Medicine

## 2019-01-08 ENCOUNTER — Other Ambulatory Visit: Payer: Self-pay

## 2019-01-08 ENCOUNTER — Encounter (HOSPITAL_COMMUNITY): Payer: Self-pay | Admitting: Emergency Medicine

## 2019-01-08 DIAGNOSIS — E1122 Type 2 diabetes mellitus with diabetic chronic kidney disease: Secondary | ICD-10-CM | POA: Insufficient documentation

## 2019-01-08 DIAGNOSIS — E1169 Type 2 diabetes mellitus with other specified complication: Secondary | ICD-10-CM | POA: Insufficient documentation

## 2019-01-08 DIAGNOSIS — Z7982 Long term (current) use of aspirin: Secondary | ICD-10-CM | POA: Insufficient documentation

## 2019-01-08 DIAGNOSIS — I5022 Chronic systolic (congestive) heart failure: Secondary | ICD-10-CM | POA: Diagnosis not present

## 2019-01-08 DIAGNOSIS — R112 Nausea with vomiting, unspecified: Secondary | ICD-10-CM | POA: Insufficient documentation

## 2019-01-08 DIAGNOSIS — I130 Hypertensive heart and chronic kidney disease with heart failure and stage 1 through stage 4 chronic kidney disease, or unspecified chronic kidney disease: Secondary | ICD-10-CM | POA: Insufficient documentation

## 2019-01-08 DIAGNOSIS — E785 Hyperlipidemia, unspecified: Secondary | ICD-10-CM | POA: Diagnosis not present

## 2019-01-08 DIAGNOSIS — I2510 Atherosclerotic heart disease of native coronary artery without angina pectoris: Secondary | ICD-10-CM | POA: Diagnosis not present

## 2019-01-08 DIAGNOSIS — Z79899 Other long term (current) drug therapy: Secondary | ICD-10-CM | POA: Diagnosis not present

## 2019-01-08 DIAGNOSIS — I252 Old myocardial infarction: Secondary | ICD-10-CM | POA: Insufficient documentation

## 2019-01-08 DIAGNOSIS — Z87891 Personal history of nicotine dependence: Secondary | ICD-10-CM | POA: Diagnosis not present

## 2019-01-08 DIAGNOSIS — R05 Cough: Secondary | ICD-10-CM | POA: Diagnosis present

## 2019-01-08 DIAGNOSIS — N390 Urinary tract infection, site not specified: Secondary | ICD-10-CM

## 2019-01-08 DIAGNOSIS — E860 Dehydration: Secondary | ICD-10-CM

## 2019-01-08 DIAGNOSIS — Z794 Long term (current) use of insulin: Secondary | ICD-10-CM | POA: Diagnosis not present

## 2019-01-08 DIAGNOSIS — E1142 Type 2 diabetes mellitus with diabetic polyneuropathy: Secondary | ICD-10-CM | POA: Insufficient documentation

## 2019-01-08 DIAGNOSIS — N181 Chronic kidney disease, stage 1: Secondary | ICD-10-CM | POA: Insufficient documentation

## 2019-01-08 DIAGNOSIS — E1159 Type 2 diabetes mellitus with other circulatory complications: Secondary | ICD-10-CM | POA: Diagnosis not present

## 2019-01-08 DIAGNOSIS — R319 Hematuria, unspecified: Secondary | ICD-10-CM

## 2019-01-08 LAB — CBG MONITORING, ED: Glucose-Capillary: 170 mg/dL — ABNORMAL HIGH (ref 70–99)

## 2019-01-08 NOTE — ED Triage Notes (Addendum)
Cough, vomiting, diarrhea, sweating x 1 week.  Pt reports infection to her pinky toe on LT foot, she is on oral abx at this time from pcp

## 2019-01-09 ENCOUNTER — Emergency Department (HOSPITAL_COMMUNITY): Payer: Medicaid Other

## 2019-01-09 LAB — CBC WITH DIFFERENTIAL/PLATELET
ABS IMMATURE GRANULOCYTES: 0.04 10*3/uL (ref 0.00–0.07)
Basophils Absolute: 0 10*3/uL (ref 0.0–0.1)
Basophils Relative: 0 %
Eosinophils Absolute: 0.3 10*3/uL (ref 0.0–0.5)
Eosinophils Relative: 3 %
HCT: 31.8 % — ABNORMAL LOW (ref 36.0–46.0)
Hemoglobin: 10.4 g/dL — ABNORMAL LOW (ref 12.0–15.0)
Immature Granulocytes: 0 %
Lymphocytes Relative: 22 %
Lymphs Abs: 2 10*3/uL (ref 0.7–4.0)
MCH: 29 pg (ref 26.0–34.0)
MCHC: 32.7 g/dL (ref 30.0–36.0)
MCV: 88.6 fL (ref 80.0–100.0)
Monocytes Absolute: 0.5 10*3/uL (ref 0.1–1.0)
Monocytes Relative: 6 %
NEUTROS ABS: 6.2 10*3/uL (ref 1.7–7.7)
Neutrophils Relative %: 69 %
Platelets: 370 10*3/uL (ref 150–400)
RBC: 3.59 MIL/uL — ABNORMAL LOW (ref 3.87–5.11)
RDW: 12.4 % (ref 11.5–15.5)
WBC: 9.1 10*3/uL (ref 4.0–10.5)
nRBC: 0 % (ref 0.0–0.2)

## 2019-01-09 LAB — BLOOD GAS, VENOUS
ACID-BASE DEFICIT: 3.5 mmol/L — AB (ref 0.0–2.0)
Bicarbonate: 21.4 mmol/L (ref 20.0–28.0)
FIO2: 21
O2 Saturation: 87.5 %
Patient temperature: 37
pCO2, Ven: 35.6 mmHg — ABNORMAL LOW (ref 44.0–60.0)
pH, Ven: 7.382 (ref 7.250–7.430)
pO2, Ven: 59.1 mmHg — ABNORMAL HIGH (ref 32.0–45.0)

## 2019-01-09 LAB — URINALYSIS, ROUTINE W REFLEX MICROSCOPIC
Bilirubin Urine: NEGATIVE
Glucose, UA: NEGATIVE mg/dL
KETONES UR: NEGATIVE mg/dL
Nitrite: NEGATIVE
PH: 5 (ref 5.0–8.0)
Protein, ur: NEGATIVE mg/dL
Specific Gravity, Urine: 1.01 (ref 1.005–1.030)
WBC, UA: >50 WBC/hpf — ABNORMAL HIGH (ref 0–5)

## 2019-01-09 LAB — COMPREHENSIVE METABOLIC PANEL
ALT: 27 U/L (ref 0–44)
AST: 21 U/L (ref 15–41)
Albumin: 4 g/dL (ref 3.5–5.0)
Alkaline Phosphatase: 49 U/L (ref 38–126)
Anion gap: 11 (ref 5–15)
BUN: 19 mg/dL (ref 6–20)
CHLORIDE: 102 mmol/L (ref 98–111)
CO2: 21 mmol/L — ABNORMAL LOW (ref 22–32)
Calcium: 8.8 mg/dL — ABNORMAL LOW (ref 8.9–10.3)
Creatinine, Ser: 1.74 mg/dL — ABNORMAL HIGH (ref 0.44–1.00)
GFR calc Af Amer: 42 mL/min — ABNORMAL LOW (ref >60)
GFR calc non Af Amer: 37 mL/min — ABNORMAL LOW (ref >60)
Glucose, Bld: 157 mg/dL — ABNORMAL HIGH (ref 70–99)
POTASSIUM: 3.8 mmol/L (ref 3.5–5.1)
Sodium: 134 mmol/L — ABNORMAL LOW (ref 135–145)
Total Bilirubin: 0.4 mg/dL (ref 0.3–1.2)
Total Protein: 8 g/dL (ref 6.5–8.1)

## 2019-01-09 LAB — LACTIC ACID, PLASMA
Lactic Acid, Venous: 3.1 mmol/L (ref 0.5–1.9)
Lactic Acid, Venous: 2.5 mmol/L (ref 0.5–1.9)

## 2019-01-09 LAB — TROPONIN I
Troponin I: 0.04 ng/mL (ref <0.03)
Troponin I: 0.03 ng/mL (ref <0.03)

## 2019-01-09 MED ORDER — SODIUM CHLORIDE 0.9 % IV SOLN
1.0000 g | Freq: Once | INTRAVENOUS | Status: AC
  Administered 2019-01-09: 1 g via INTRAVENOUS
  Filled 2019-01-09: qty 10

## 2019-01-09 MED ORDER — SODIUM CHLORIDE 0.9 % IV BOLUS
1000.0000 mL | Freq: Once | INTRAVENOUS | Status: AC
  Administered 2019-01-09: 1000 mL via INTRAVENOUS

## 2019-01-09 MED ORDER — CEPHALEXIN 500 MG PO CAPS: 500.0000 mg | ORAL_CAPSULE | Freq: Three times a day (TID) | ORAL | 0 refills | Status: DC

## 2019-01-09 MED ORDER — ONDANSETRON HCL 4 MG PO TABS: 4.0000 mg | ORAL_TABLET | Freq: Three times a day (TID) | ORAL | 0 refills | Status: DC | PRN

## 2019-01-09 MED ORDER — SODIUM CHLORIDE 0.9 % IV BOLUS (SEPSIS)
2000.0000 mL | Freq: Once | INTRAVENOUS | Status: AC
  Administered 2019-01-09: 2000 mL via INTRAVENOUS

## 2019-01-09 MED ORDER — ONDANSETRON HCL 4 MG/2ML IJ SOLN
4.0000 mg | Freq: Once | INTRAMUSCULAR | Status: AC
  Administered 2019-01-09: 4 mg via INTRAVENOUS
  Filled 2019-01-09: qty 2

## 2019-01-09 NOTE — ED Notes (Signed)
Date and time results received: 01/09/19 2:47 AM  (use smartphrase ".now" to insert current time)  Test: lactic Critical Value: 2.5  Name of Provider Notified: Dr Eliane Decree   Orders Received? Or Actions Taken?:

## 2019-01-09 NOTE — ED Notes (Signed)
Date and time results received: 01/09/19 1:05 AM  (use smartphrase ".now" to insert current time)  Test: trop Critical Value: 0.04  Name of Provider Notified: Dr Eliane Decree Orders Received? Or Actions Taken?:

## 2019-01-09 NOTE — ED Notes (Signed)
Date and time results received: 01/09/19 1:04 AM  (use smartphrase ".now" to insert current time)  Test: lactic Critical Value: 3.1 Name of Provider Notified: Dr Eliane Decree  Orders Received? Or Actions Taken?:

## 2019-01-09 NOTE — ED Provider Notes (Signed)
[facility] EMERGENCY DEPARTMENT Provider Note   CSN: [id number] Arrival date & time: 01/08/19  2330  Time seen 11:47 PM   History   Chief Complaint Chief Complaint  Patient presents with  . Influenza    HPI Anne [name] is a 38 y.o. female.  HPI patient reports she started URI symptoms the week of February 3.  She states she was having cough with clear mucus production with nausea and vomiting.  She was seen by her podiatrist on Friday, the 14th because she is having an infection in her left little toe and left lateral distal foot.  He cultured her wound and started her on oral rifampin once a day and Septra twice a day.  He has commented to her was if that feels he would put her on IV vancomycin.  He also discussed doing an amputation on the little toe once he got the tissue around the toe less infected so he can get a good graft going.  She states the bone is totally gone in that toe.  She has a follow-up appointment a week from tomorrow.  She states that today she started having a racing heart and states tonight prior to coming into the ED her heart rate was 122.  She started feeling short of breath about 11 PM.  She denies any chest pain.  She has been having nausea and vomiting 10-20 times a day over the past 2 weeks, but she started having diarrhea 2 days ago.  She had a couple episodes yesterday and today.  She states she still coughing up clear foamy mucus, she has a mild sore throat mainly from coughing.  She describes rhinorrhea and sneezing.  She has some mild abdominal cramping from the diarrhea and starting her menses.  She states today she has been dizzy when she stands up and lightheaded and had near syncope.  He denies any fever when they have checked.  She states her CBGs have been on the low side this past week.  She denies getting the flu shot this season.  She had a STEMI in December and states she is concerned because she did not have chest pain with the STEMI.  PCP  Zhou-Talbert, Elwyn Lade, MD [facility]   Past Medical History:  Diagnosis Date  . Acid reflux   . Anxiety   . Arthritis   . 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 CAD (coronary artery disease) 11/13/2018   Late presentation anterior MI 12/19 >> LHC - dLM 25, mLAD 99, oOM2 100 (R-L collats), irreg RCA, EF 25-35 >> PCI: POBA to mLAD  . Chronic systolic CHF (congestive heart failure) ([place]) 11/28/2018   Ischemic CM // late presentation ant MI 10/2018 tx with POBA to LAD (residual CAD with CTO of the OM2) // Echo 12/19:  No mural apical thrombus, septal, apical mid ant and inf HK; mild LVH, EF 30-35, mild MR  . CKD (chronic kidney disease), stage I   . Diabetes mellitus type 1 ([place])   . Former tobacco use   . Gastroparesis   . Hypercholesteremia   . Hypertension   . Osteomyelitis ([place])    a. s/p R forefoot amputation.  . Renal failure   . Sciatica     Patient Active Problem List   Diagnosis Date Noted  . Chronic systolic CHF (congestive heart failure) ([place]) 11/28/2018  . S/P PTCA (percutaneous transluminal coronary angioplasty)   .  CAD (coronary artery disease) 11/13/2018  . 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 11/11/2018  . Osteomyelitis ([place]) 07/01/2018  . Personal history of noncompliance with medical treatment, presenting hazards to health 10/28/2015  . Multiple nevi 10/07/2012  . Toe ulcer ([place]) 03/11/2012  . Essential hypertension 03/11/2012  . 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 Diabetic neuropathy ([place]) 02/11/2012  . Back pain 02/11/2012  . Muscle spasm 02/11/2012  . Hyperlipidemia associated with type 2 diabetes mellitus ([place]) 02/11/2012  . Obesity 02/11/2012  . Cellulitis in diabetic foot ([place]) 10/31/2011    Past Surgical History:  Procedure Laterality Date  . AMPUTATION  Right 11/03/2011   Procedure: AMPUTATION RAY;  Surgeon: Jamesetta So;  Location: [facility];  Service: General;  Laterality: Right;  Right fourth and fifth metatarsal   . APPENDECTOMY    . CESAREAN SECTION  2004 and 2007   x2  . CORONARY/GRAFT ACUTE MI REVASCULARIZATION N/A 11/11/2018   Procedure: CORONARY/GRAFT ACUTE MI REVASCULARIZATION;  Surgeon: Jettie Booze, MD;  Location: [place] [facility];  Service: Cardiovascular;  Laterality: N/A;  . LEFT HEART CATH AND CORONARY ANGIOGRAPHY N/A 11/11/2018   Procedure: LEFT HEART CATH AND CORONARY ANGIOGRAPHY;  Surgeon: Jettie Booze, MD;  Location: [place] [facility];  Service: Cardiovascular;  Laterality: N/A;  . TUBAL LIGATION       OB History   No obstetric history on file.      Home Medications    Prior to Admission medications   Medication Sig Start Date End Date Taking? Authorizing Provider  aspirin EC 81 MG tablet Take 1 tablet (81 mg total) by mouth daily. 11/15/18 11/15/19  Leanor Kail, PA  atorvastatin (LIPITOR) 80 MG tablet Take 1 tablet (80 mg total) by mouth daily at 6 PM. 11/15/18   Bhagat, Bhavinkumar, PA  carvedilol (COREG) 6.25 MG tablet Take 1 tablet (6.25 mg total) by mouth 2 (two) times daily. 12/21/18   Richardson Dopp T, PA-C  cephALEXin (KEFLEX) 500 MG capsule Take 1 capsule (500 mg total) by mouth 3 (three) times daily. 01/09/19   Rolland Porter, MD  colchicine 0.6 MG tablet Take 1 tablet (0.6 mg total) by mouth 2 (two) times daily. 11/14/18   Kroeger, Lorelee Cover., PA-C  Exenatide ER (BYDUREON) 2 MG SRER Inject 2 mg into the skin once a week.    [provider]  furosemide (LASIX) 40 MG tablet Take 1 tablet (40 mg total) by mouth daily. 12/09/18 03/09/19  Richardson Dopp T, PA-C  gabapentin (NEURONTIN) 300 MG capsule Take 300 mg by mouth 3 (three) times daily as needed.    [provider]  LANTUS SOLOSTAR 100 UNIT/ML Solostar Pen INNJECT 50 UNITS S.Q. ONCE DAILY AT 10 P.M. 02/03/16   Cassandria Anger, MD  losartan (COZAAR) 25 MG tablet Take 1 tablet (25 mg total) by mouth daily. 12/21/18 03/21/19  Richardson Dopp T, PA-C  metFORMIN (GLUCOPHAGE) 500 MG tablet TAKE 1 TABLET BY MOUTH TWICE DAILY WITH MEALS. 02/03/16   Cassandria Anger, MD  metoCLOPramide (REGLAN) 10 MG/10ML SOLN Take 10 mg by mouth 3 (three) times daily.     [provider]  Multiple Vitamin (MULTIVITAMIN WITH MINERALS) TABS tablet Take 1 tablet by mouth daily. 07/04/18   Isaac Bliss, Rayford Halsted, MD  nitroGLYCERIN (NITROSTAT) 0.4 MG SL tablet Place 1 tablet (0.4  mg total) under the tongue every 5 (five) minutes as needed. 11/15/18   Bhagat, Bhavinkumar, PA  NOVOLOG FLEXPEN 100 UNIT/ML FlexPen INJECT 12-18 UNITS S.Q. THREE TIMES DAILY WITH MEALS. 04/29/16   Nida, Marella Chimes, MD  ondansetron (ZOFRAN ODT) 4 MG disintegrating tablet Take 1 tablet (4 mg total) by mouth every 8 (eight) hours as needed for nausea or vomiting. 07/03/18   Isaac Bliss, Rayford Halsted, MD  ondansetron (ZOFRAN) 4 MG tablet Take 1 tablet (4 mg total) by mouth every 8 (eight) hours as needed. 01/09/19   Rolland Porter, MD  potassium chloride (K-DUR) 10 MEQ tablet Take 1 tablet (10 mEq total) by mouth daily. 12/09/18 03/09/19  Richardson Dopp T, PA-C  sertraline (ZOLOFT) 100 MG tablet Take 150 mg by mouth daily.     [provider]  spironolactone (ALDACTONE) 25 MG tablet Take 0.5 tablets (12.5 mg total) by mouth daily. 11/15/18   Leanor Kail, PA  ticagrelor ([name]) 90 MG TABS tablet Take 1 tablet (90 mg total) by mouth 2 (two) times daily. 12/15/18   Leanor Kail, PA    Family History Family History  Problem Relation Age of Onset  . Diabetes Father   . Lung cancer Father   . Alcoholism Father   . Asthma Mother   . Kidney disease Mother   . Anemia Mother        hemolytic  . Hypertension Mother   . Heart attack Paternal Grandmother   . Diabetes Paternal Grandmother   . Diabetes Paternal Grandfather   .  Anesthesia problems Neg Hx   . Hypotension Neg Hx   . Malignant hyperthermia Neg Hx   . Pseudochol deficiency Neg Hx     Social History Social History   Tobacco Use  . Smoking status: Former Smoker    Packs/day: 0.25    Years: 20.00    Pack years: 5.00    Types: Cigarettes    Last attempt to quit: 12/29/2013    Years since quitting: 5.0  . Smokeless tobacco: Never Used  Substance Use Topics  . Alcohol use: No  . Drug use: No  lives with spouse   Allergies   Canagliflozin and Nsaids   Review of Systems Review of Systems  All other systems reviewed and are negative.    Physical Exam Updated Vital Signs BP 129/72 (BP Location: Left Arm)   Pulse 100   Temp 99 F (37.2 C) (Oral)   Resp 18   Ht 5\' 10"  (1.778 m)   Wt 85.3 kg   LMP 01/08/2019   SpO2 99%   BMI 26.98 kg/m   Vital signs normal except borderline tachycardia   Physical Exam Constitutional:      Appearance: Normal appearance.  HENT:     Head: Normocephalic and atraumatic.     Right Ear: External ear normal.     Left Ear: External ear normal.     Nose: Nose normal.     Mouth/Throat:     Mouth: Mucous membranes are dry.  Eyes:     Extraocular Movements: Extraocular movements intact.     Conjunctiva/sclera: Conjunctivae normal.     Pupils: Pupils are equal, round, and reactive to light.  Neck:     Musculoskeletal: Normal range of motion.  Cardiovascular:     Rate and Rhythm: Regular rhythm. Tachycardia present.  Pulmonary:     Effort: Pulmonary effort is normal. No respiratory distress.     Breath sounds: Normal breath sounds. No wheezing.  Abdominal:  General: Bowel sounds are normal. There is no distension.     Palpations: Abdomen is soft.     Tenderness: There is no abdominal tenderness. There is no guarding or rebound.  Musculoskeletal:     Comments: Status post amputation of her right distal foot, patient has a bulky wrap on her left foot placed by the podiatrist.  There is no obvious  swelling of her extremities, there is no obvious redness.  Skin:    General: Skin is warm and dry.     Findings: No erythema.  Neurological:     General: No focal deficit present.     Mental Status: She is alert and oriented to person, place, and time.     Cranial Nerves: No cranial nerve deficit.  Psychiatric:        Mood and Affect: Mood normal.        Behavior: Behavior normal.        Thought Content: Thought content normal.      ED Treatments / Results  Labs (all labs ordered are listed, but only abnormal results are displayed) Results for orders placed or performed during the hospital encounter of 01/08/19  Blood Culture (routine x 2)  Result Value Ref Range   Specimen Description BLOOD RIGHT ARM    Special Requests      BOTTLES DRAWN AEROBIC AND ANAEROBIC Blood Culture adequate volume Performed at [facility], [address], [place], [place] [address]    Culture PENDING    Report Status PENDING   Blood Culture (routine x 2)  Result Value Ref Range   Specimen Description BLOOD LEFT ARM    Special Requests      BOTTLES DRAWN AEROBIC AND ANAEROBIC Blood Culture results may not be optimal due to an excessive volume of blood received in culture bottles Performed at [facility], [address] [place], [place] [address]    Culture PENDING    Report Status PENDING   Lactic acid, plasma  Result Value Ref Range   Lactic Acid, Venous 3.1 (HH) 0.5 - 1.9 mmol/L  Lactic acid, plasma  Result Value Ref Range   Lactic Acid, Venous 2.5 (HH) 0.5 - 1.9 mmol/L  Comprehensive metabolic panel  Result Value Ref Range   Sodium 134 (L) 135 - 145 mmol/L   Potassium 3.8 3.5 - 5.1 mmol/L   Chloride 102 98 - 111 mmol/L   CO2 21 (L) 22 - 32 mmol/L   Glucose, Bld 157 (H) 70 - 99 mg/dL   BUN 19 6 - 20 mg/dL   Creatinine, Ser 1.74 (H) 0.44 - 1.00 mg/dL   Calcium 8.8 (L) 8.9 - 10.3 mg/dL   Total Protein 8.0 6.5 - 8.1 g/dL   Albumin 4.0 3.5 - 5.0 g/dL   AST 21 15 - 41 U/L   ALT 27 0 -  44 U/L   Alkaline Phosphatase 49 38 - 126 U/L   Total Bilirubin 0.4 0.3 - 1.2 mg/dL   GFR calc non Af Amer 37 (L) >60 mL/min   GFR calc Af Amer 42 (L) >60 mL/min   Anion gap 11 5 - 15  CBC WITH DIFFERENTIAL  Result Value Ref Range   WBC 9.1 4.0 - 10.5 K/uL   RBC 3.59 (L) 3.87 - 5.11 MIL/uL   Hemoglobin 10.4 (L) 12.0 - 15.0 g/dL   HCT 31.8 (L) 36.0 - 46.0 %   MCV 88.6 80.0 - 100.0 fL   MCH 29.0 26.0 - 34.0 pg   MCHC 32.7  30.0 - 36.0 g/dL   RDW 12.4 11.5 - 15.5 %   Platelets 370 150 - 400 K/uL   nRBC 0.0 0.0 - 0.2 %   Neutrophils Relative % 69 %   Neutro Abs 6.2 1.7 - 7.7 K/uL   Lymphocytes Relative 22 %   Lymphs Abs 2.0 0.7 - 4.0 K/uL   Monocytes Relative 6 %   Monocytes Absolute 0.5 0.1 - 1.0 K/uL   Eosinophils Relative 3 %   Eosinophils Absolute 0.3 0.0 - 0.5 K/uL   Basophils Relative 0 %   Basophils Absolute 0.0 0.0 - 0.1 K/uL   Immature Granulocytes 0 %   Abs Immature Granulocytes 0.04 0.00 - 0.07 K/uL  Urinalysis, Routine w reflex microscopic  Result Value Ref Range   Color, Urine AMBER (A) YELLOW   APPearance CLEAR CLEAR   Specific Gravity, Urine 1.010 1.005 - 1.030   pH 5.0 5.0 - 8.0   Glucose, UA NEGATIVE NEGATIVE mg/dL   Hgb urine dipstick LARGE (A) NEGATIVE   Bilirubin Urine NEGATIVE NEGATIVE   Ketones, ur NEGATIVE NEGATIVE mg/dL   Protein, ur NEGATIVE NEGATIVE mg/dL   Nitrite NEGATIVE NEGATIVE   Leukocytes,Ua SMALL (A) NEGATIVE   RBC / HPF >50 (H) 0 - 5 RBC/hpf   WBC, UA >50 (H) 0 - 5 WBC/hpf   Bacteria, UA RARE (A) NONE SEEN   Squamous Epithelial / LPF 0-5 0 - 5   Hyaline Casts, UA PRESENT   Blood gas, venous ([facility], [facility], [facility])  Result Value Ref Range   FIO2 21.00    pH, Ven 7.382 7.250 - 7.430   pCO2, Ven 35.6 (L) 44.0 - 60.0 mmHg   pO2, Ven 59.1 (H) 32.0 - 45.0 mmHg   Bicarbonate 21.4 20.0 - 28.0 mmol/L   Acid-base deficit 3.5 (H) 0.0 - 2.0 mmol/L   O2 Saturation 87.5 %   Patient temperature 37.0   Troponin I - Once  Result Value Ref Range    Troponin I 0.04 (HH) <0.03 ng/mL  Troponin I - ONCE - STAT  Result Value Ref Range   Troponin I 0.03 (HH) <0.03 ng/mL  CBG monitoring, ED  Result Value Ref Range   Glucose-Capillary 170 (H) 70 - 99 mg/dL   Laboratory interpretation all normal except elevated lactic acid, chronic renal insufficiency, mild hyperglycemia,, stable mild anemia, VBG shows normal pH with mild respiratory acidosis, borderline positive troponin    EKG EKG Interpretation  Date/Time:  Monday January 09 2019 00:13:01 EST Ventricular Rate:  101 PR Interval:    QRS Duration: 90 QT Interval:  343 QTC Calculation: 445 R Axis:   -59 Text Interpretation:  Sinus tachycardia Left anterior fascicular block Anterior infarct, old Since last tracing 14 Nov 2018 ST no longer elevated in Inferolateral leads Confirmed by Rolland Porter [phone]) on 01/09/2019 12:17:31 AM   Radiology Dg Chest Port 1 View  Result Date: 01/09/2019 CLINICAL DATA:  38 year old female with cough vomiting and diarrhea. EXAM: PORTABLE CHEST 1 VIEW COMPARISON:  Portable chest 11/11/2018. FINDINGS: Portable AP upright view at 0026 hours. Lung volumes and mediastinal contours remain normal. Allowing for portable technique the lungs are clear. Visualized tracheal air column is within normal limits. Paucity of bowel gas in the upper abdomen. No osseous abnormality identified. IMPRESSION: Negative portable chest. Electronically Signed   By: Genevie Ann M.D.   On: 01/09/2019 00:36    Procedures Procedures (including critical care time)  Medications Ordered in ED Medications  sodium chloride 0.9 % bolus 2,000  mL (0 mLs Intravenous Stopped 01/09/19 0226)  sodium chloride 0.9 % bolus 1,000 mL (0 mLs Intravenous Stopped 01/09/19 0405)  cefTRIAXone (ROCEPHIN) 1 g in sodium chloride 0.9 % 100 mL IVPB (0 g Intravenous Stopped 01/09/19 0406)     Initial Impression / Assessment and Plan / ED Course  I have reviewed the triage vital signs and the nursing  notes.  Pertinent labs & imaging results that were available during my care of the patient were reviewed by me and considered in my medical decision making (see chart for details).     Patient was given 2 L IV fluids for dehydration.  Laboratory testing and EKG was ordered.  Recheck at 2:10 AM patient's tongue is now moist, she is going to the bathroom to give a urine sample.  When asked her she is feeling better she is unable to tell.  Her heart rate has improved with the IV fluids.  Her blood pressure remains normal.  2:48 AM patient's repeat lactic acid has improved to 2.5.  A 3rd L of normal saline was ordered.  3:08 AM patient's urinalysis has resulted in is consistent with a urinary tract infection.  She was given Rocephin IV.  Urine culture was ordered.  Patient's delta troponin is negative, the second was actually lower than the first, and her elevated lactic acid has improved with 2 L IV fluids.  She is gotten an additional liter since then.  Recheck at 5 AM patient's IV fluids and IV antibiotics have infused.  Patient feels like she is able to go home.  Final Clinical Impressions(s) / ED Diagnoses   Final diagnoses:  Dehydration  Non-intractable vomiting with nausea, unspecified vomiting type  Urinary tract infection with hematuria, site unspecified    ED Discharge Orders         Ordered    ondansetron (ZOFRAN) 4 MG tablet  Every 8 hours PRN     01/09/19 0506    cephALEXin (KEFLEX) 500 MG capsule  3 times daily,   Status:  Discontinued     01/09/19 0506    cephALEXin (KEFLEX) 500 MG capsule  3 times daily     01/09/19 0507          Plan discharge  Rolland Porter, MD, Barbette Or, MD 01/09/19 [phone]

## 2019-01-09 NOTE — Discharge Instructions (Addendum)
Try to drink plenty of fluids.  Take the Zofran for nausea or vomiting.  Take the antibiotics until gone.  Return to the emergency department if you feel worse such as fever, continued vomiting and you feel like you are getting dehydrated again, or you feel worse in any way.

## 2019-01-10 ENCOUNTER — Ambulatory Visit: Payer: Medicaid Other | Admitting: Orthopaedic Surgery

## 2019-01-10 LAB — URINE CULTURE
Culture: <10000 — AB
Special Requests: NORMAL

## 2019-01-11 ENCOUNTER — Ambulatory Visit: Payer: Medicaid Other | Admitting: Orthopaedic Surgery

## 2019-01-14 LAB — CULTURE, BLOOD (ROUTINE X 2)
Culture: NO GROWTH
Culture: NO GROWTH
Special Requests: ADEQUATE

## 2019-01-16 ENCOUNTER — Other Ambulatory Visit: Payer: Medicaid Other

## 2019-01-16 DIAGNOSIS — I5022 Chronic systolic (congestive) heart failure: Secondary | ICD-10-CM

## 2019-01-16 DIAGNOSIS — I2510 Atherosclerotic heart disease of native coronary artery without angina pectoris: Secondary | ICD-10-CM

## 2019-01-16 DIAGNOSIS — E785 Hyperlipidemia, unspecified: Secondary | ICD-10-CM

## 2019-01-16 DIAGNOSIS — R0602 Shortness of breath: Secondary | ICD-10-CM

## 2019-01-16 DIAGNOSIS — I10 Essential (primary) hypertension: Secondary | ICD-10-CM

## 2019-01-16 DIAGNOSIS — E1169 Type 2 diabetes mellitus with other specified complication: Secondary | ICD-10-CM

## 2019-01-17 ENCOUNTER — Ambulatory Visit: Payer: Medicaid Other | Admitting: Orthopaedic Surgery

## 2019-01-17 LAB — BASIC METABOLIC PANEL
BUN/Creatinine Ratio: 11 (ref 9–23)
BUN: 17 mg/dL (ref 6–20)
CO2: 20 mmol/L (ref 20–29)
Calcium: 9.3 mg/dL (ref 8.7–10.2)
Chloride: 100 mmol/L (ref 96–106)
Creatinine, Ser: 1.59 mg/dL — ABNORMAL HIGH (ref 0.57–1.00)
GFR, EST AFRICAN AMERICAN: 47 mL/min/{1.73_m2} — AB (ref >59)
GFR, EST NON AFRICAN AMERICAN: 41 mL/min/{1.73_m2} — AB (ref >59)
Glucose: 59 mg/dL — ABNORMAL LOW (ref 65–99)
Potassium: 4.9 mmol/L (ref 3.5–5.2)
Sodium: 138 mmol/L (ref 134–144)

## 2019-01-17 LAB — LIPID PANEL
Chol/HDL Ratio: 4.3 ratio (ref 0.0–4.4)
Cholesterol, Total: 116 mg/dL (ref 100–199)
HDL: 27 mg/dL — ABNORMAL LOW (ref >39)
LDL CALC: 53 mg/dL (ref 0–99)
Triglycerides: 179 mg/dL — ABNORMAL HIGH (ref 0–149)
VLDL Cholesterol Cal: 36 mg/dL (ref 5–40)

## 2019-01-18 ENCOUNTER — Other Ambulatory Visit: Payer: Medicaid Other

## 2019-01-18 ENCOUNTER — Other Ambulatory Visit (HOSPITAL_COMMUNITY): Payer: Medicaid Other

## 2019-01-24 ENCOUNTER — Ambulatory Visit: Payer: Medicaid Other | Admitting: Orthopaedic Surgery

## 2019-01-24 ENCOUNTER — Encounter: Payer: Self-pay | Admitting: Orthopaedic Surgery

## 2019-01-25 ENCOUNTER — Encounter: Payer: Self-pay | Admitting: Physician Assistant

## 2019-01-25 ENCOUNTER — Ambulatory Visit: Payer: Medicaid Other | Admitting: Physician Assistant

## 2019-01-25 VITALS — BP 142/90 | HR 98 | Ht 70.0 in | Wt 196.0 lb

## 2019-01-25 DIAGNOSIS — I10 Essential (primary) hypertension: Secondary | ICD-10-CM | POA: Diagnosis not present

## 2019-01-25 DIAGNOSIS — Z01810 Encounter for preprocedural cardiovascular examination: Secondary | ICD-10-CM | POA: Diagnosis not present

## 2019-01-25 DIAGNOSIS — I5022 Chronic systolic (congestive) heart failure: Secondary | ICD-10-CM

## 2019-01-25 DIAGNOSIS — E1169 Type 2 diabetes mellitus with other specified complication: Secondary | ICD-10-CM

## 2019-01-25 DIAGNOSIS — E785 Hyperlipidemia, unspecified: Secondary | ICD-10-CM

## 2019-01-25 DIAGNOSIS — I2510 Atherosclerotic heart disease of native coronary artery without angina pectoris: Secondary | ICD-10-CM | POA: Diagnosis not present

## 2019-01-25 MED ORDER — CARVEDILOL 6.25 MG PO TABS: 9.3750 mg | ORAL_TABLET | Freq: Two times a day (BID) | ORAL | 6 refills | Status: DC

## 2019-01-25 NOTE — Progress Notes (Signed)
Cardiology Office Note:    Date:  01/25/2019   ID:  Anne [name], DOB [birth year], MRN [id number]  PCP:  Alfonse Flavors, MD  Cardiologist:  Dorris Carnes, MD   Electrophysiologist:  None   Referring MD: Alfonse Flavors   Chief Complaint  Patient presents with  . Surgical Clearance     History of Present Illness:    Anne [name] is a 38 y.o. female with coronary artery disease, systolic HF 2/2 ischemic CM, type 1diabeteswith assoc gastroparesis,hyperlipidemia,hypertension, osteomyelitis with hx of R forefoot amputation, CKD.  She was admitted in 10/2018 with a late presentation anterior MI c/b post MI pericarditis.  Cardiac Cath demonstrated diffuse dz in the LAD and LCx.  She underwent POBA to the LAD b/c it was not large enough for stenting or CABG. EF was 30-35. She was last seen in 11/2018 for follow up.  I changed her Entresto back to Losartan due to low BP.  She was supposed to FU after a repeat Echo to recheck her EF.  However, she needs a foot amputation and returns sooner for surgical clearance.     Anne [name] returns for follow up.  She needs her 5th toe on the L foot amputated.  She had a UTI several weeks ago. She was dehydrated and was orthostatic. She had one syncopal episode shortly after standing.  Since Rx for the UTI, she is doing better.  She is no longer dizzy.  She has not had any chest pain or significant shortness of breath.  She sleeps on 1 pillow.  She has not had paroxysmal nocturnal dyspnea, leg swelling.  She has a lot of nausea related to diabetic gastroparesis.    Prior CV studies:   The following studies were reviewed today:  Echo 11/13/18 No mural apical thrombus, septal, apical mid ant and inf HK; mild LVH, EF 30-35, mild MR  Cardiac Catheterization12/20/19 LM dist 25 LAD mid 99 OM2 ost 100 - CTO; OM2 filled by collats from post atrio RCA irregs EF 25-35 PCI: POBA to mid LAD   Echo 11/28/15 EF 55, no RWMA, trivial  eff  Past Medical History:  Diagnosis Date  . Acid reflux   . Anxiety   . Arthritis   . 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 CAD (coronary artery disease) 11/13/2018   Late presentation anterior MI 12/19 >> LHC - dLM 25, mLAD 99, oOM2 100 (R-L collats), irreg RCA, EF 25-35 >> PCI: POBA to mLAD  . Chronic systolic CHF (congestive heart failure) ([place]) 11/28/2018   Ischemic CM // late presentation ant MI 10/2018 tx with POBA to LAD (residual CAD with CTO of the OM2) // Echo 12/19:  No mural apical thrombus, septal, apical mid ant and inf HK; mild LVH, EF 30-35, mild MR  . CKD (chronic kidney disease), stage I   . Diabetes mellitus type 1 ([place])   . Former tobacco use   . Gastroparesis   . Hypercholesteremia   . Hypertension   . Osteomyelitis ([place])    a. s/p R forefoot amputation.  . Renal failure   . Sciatica    Surgical Hx: The patient  has a past surgical history that includes Appendectomy; Cesarean section (2004 and 2007); Amputation (Right, 11/03/2011); Tubal ligation; Coronary/Graft Acute MI Revascularization (N/A, 11/11/2018); and LEFT HEART CATH AND CORONARY ANGIOGRAPHY (N/A, 11/11/2018).   Current Medications: Current Meds  Medication Sig  . aspirin EC 81 MG  tablet Take 1 tablet (81 mg total) by mouth daily.  Marland Kitchen atorvastatin (LIPITOR) 80 MG tablet Take 1 tablet (80 mg total) by mouth daily at 6 PM.  . carvedilol (COREG) 6.25 MG tablet Take 1.5 tablets (9.375 mg total) by mouth 2 (two) times daily.  . cephALEXin (KEFLEX) 500 MG capsule Take 1 capsule (500 mg total) by mouth 3 (three) times daily.  . colchicine 0.6 MG tablet Take 1 tablet (0.6 mg total) by mouth 2 (two) times daily.  . Exenatide ER (BYDUREON) 2 MG SRER Inject 2 mg into the skin once a week.  . furosemide (LASIX) 40 MG tablet Take 1 tablet (40 mg total) by mouth daily.  Marland Kitchen gabapentin (NEURONTIN) 300 MG capsule Take 300 mg by mouth 3 (three) times daily as needed.  Marland Kitchen LANTUS SOLOSTAR 100  UNIT/ML Solostar Pen INNJECT 50 UNITS S.Q. ONCE DAILY AT 10 P.M.  . losartan (COZAAR) 25 MG tablet Take 1 tablet (25 mg total) by mouth daily.  . metFORMIN (GLUCOPHAGE) 500 MG tablet TAKE 1 TABLET BY MOUTH TWICE DAILY WITH MEALS.  Marland Kitchen metoCLOPramide (REGLAN) 10 MG/10ML SOLN Take 10 mg by mouth 3 (three) times daily.   . Multiple Vitamin (MULTIVITAMIN WITH MINERALS) TABS tablet Take 1 tablet by mouth daily.  . nitroGLYCERIN (NITROSTAT) 0.4 MG SL tablet Place 1 tablet (0.4 mg total) under the tongue every 5 (five) minutes as needed.  Marland Kitchen NOVOLOG FLEXPEN 100 UNIT/ML FlexPen INJECT 12-18 UNITS S.Q. THREE TIMES DAILY WITH MEALS.  Marland Kitchen ondansetron (ZOFRAN ODT) 4 MG disintegrating tablet Take 1 tablet (4 mg total) by mouth every 8 (eight) hours as needed for nausea or vomiting.  . ondansetron (ZOFRAN) 4 MG tablet Take 1 tablet (4 mg total) by mouth every 8 (eight) hours as needed.  . potassium chloride (K-DUR) 10 MEQ tablet Take 1 tablet (10 mEq total) by mouth daily.  . sertraline (ZOLOFT) 100 MG tablet Take 150 mg by mouth daily.   Marland Kitchen spironolactone (ALDACTONE) 25 MG tablet Take 0.5 tablets (12.5 mg total) by mouth daily.  . ticagrelor ([name]) 90 MG TABS tablet Take 1 tablet (90 mg total) by mouth 2 (two) times daily.  . [DISCONTINUED] carvedilol (COREG) 6.25 MG tablet Take 1 tablet (6.25 mg total) by mouth 2 (two) times daily.     Allergies:   Canagliflozin and Nsaids   Social History   Tobacco Use  . Smoking status: Former Smoker    Packs/day: 0.25    Years: 20.00    Pack years: 5.00    Types: Cigarettes    Last attempt to quit: 12/29/2013    Years since quitting: 5.0  . Smokeless tobacco: Never Used  Substance Use Topics  . Alcohol use: No  . Drug use: No     Family Hx: The patient's family history includes Alcoholism in her father; Anemia in her mother; Asthma in her mother; Diabetes in her father, paternal grandfather, and paternal grandmother; Heart attack in her paternal grandmother;  Hypertension in her mother; Kidney disease in her mother; Lung cancer in her father. There is no history of Anesthesia problems, Hypotension, Malignant hyperthermia, or Pseudochol deficiency.  ROS:   Please see the history of present illness.    Review of Systems  Constitution: Positive for decreased appetite, diaphoresis and malaise/fatigue.  HENT: Positive for hearing loss.   Eyes: Positive for visual disturbance.  Cardiovascular: Positive for chest pain, dyspnea on exertion and irregular heartbeat.  Respiratory: Positive for cough and shortness of breath.  Musculoskeletal: Positive for myalgias.  Gastrointestinal: Positive for diarrhea and nausea.  Neurological: Positive for dizziness and loss of balance.  Psychiatric/Behavioral: Positive for depression.   All other systems reviewed and are negative.   EKGs/Labs/Other Test Reviewed:    EKG:  EKG is   ordered today.  The ekg ordered today demonstrates normal sinus rhythm, HR 98, LAD, PRWP, non-specific ST-TW changes, QTc 418  Recent Labs: 11/12/2018: B Natriuretic Peptide 500.8 12/21/2018: NT-Pro BNP 2,669 01/09/2019: ALT 27; Hemoglobin 10.4; Platelets 370 01/16/2019: BUN 17; Creatinine, Ser 1.59; Potassium 4.9; Sodium 138   Recent Lipid Panel Lab Results  Component Value Date/Time   CHOL 116 01/16/2019 01:47 PM   TRIG 179 (H) 01/16/2019 01:47 PM   HDL 27 (L) 01/16/2019 01:47 PM   CHOLHDL 4.3 01/16/2019 01:47 PM   CHOLHDL 6.8 11/11/2018 01:04 PM   LDLCALC 53 01/16/2019 01:47 PM   LDLDIRECT 98 02/10/2012 12:30 PM    Physical Exam:    VS:  BP (!) 142/90   Pulse 98   Ht 5\' 10"  (1.778 m)   Wt 196 lb (88.9 kg)   LMP 01/08/2019   BMI 28.12 kg/m     Wt Readings from Last 3 Encounters:  01/25/19 196 lb (88.9 kg)  01/08/19 188 lb (85.3 kg)  12/21/18 198 lb (89.8 kg)     Physical Exam  Constitutional: She is oriented to person, place, and time. She appears well-developed and well-nourished. No distress.  HENT:  Head:  Normocephalic and atraumatic.  Eyes: No scleral icterus.  Neck: No JVD present. No thyromegaly present.  Cardiovascular: Normal rate and regular rhythm.  No murmur heard. Pulmonary/Chest: Effort normal. She has no rales.  Abdominal: Soft. She exhibits no distension.  Musculoskeletal:        General: No edema.  Lymphadenopathy:    She has no cervical adenopathy.  Neurological: She is alert and oriented to person, place, and time.  Skin: Skin is warm and dry.  Psychiatric: She has a normal mood and affect.    ASSESSMENT & PLAN:    Preoperative cardiovascular examination She needs her L 5th toe amputated due to diabetic foot ulcer.  This will be with Dr. Abagail Kitchens with [facility].  Her perioperative risk of major cardiac event is high at 11% based upon the Revised Cardiac Risk Index.  Her functional status is ok at 4.3 METs according to the [facility] Activity Status Index.  Her risk is elevated for perioperative CV complications as noted.  But, it is not prohibitive.  She requires no further testing.  We prefer that she remain on ASA and [name] without interruption to reduce risk of perioperative MI.  However, if her bleeding risk is too great, we could consider holding [name] for 5 days as she did not undergo stenting at the time of her MI in 10/2018.  I reviewed her case today with Dr. Irish Lack (attending MD) who did her PCI in 10/2018.  He agreed with the above.    Coronary artery disease involving native coronary artery of native heart without angina pectoris  S/p late presentation anterior MI in 10/2018 tx with POBA to the LAD.  She does have residual disease with a chronically occluded second obtuse marginal and 25% distal left main stenosis.  She is not currently having anginal symptoms and her ECG looks improved.  Continue ASA, [name], Lipitor, Coreg, Losartan.    Chronic systolic CHF (congestive heart failure) ([facility])  EF 30-35.  NYHA 2.  Volume  status appears stable.   Continue beta-blocker, angiotensin receptor blocker, mineralocorticoid receptor antagonist.  She could not tolerate angiotensin receptor neprilysin inhibitor due to hypotension.  She is due for a FU echo.   I will arrange this in the next 1 week.    Essential hypertension, benign BP elevated.  I will increase her Coreg to 9.375 mg twice daily.    Hyperlipidemia associated with type 2 diabetes mellitus ([place]) Continue high dose statin Rx.    Dispo:  Return in about 4 weeks (around 02/22/2019) for w/ Richardson Dopp, PA-C.   Medication Adjustments/Labs and Tests Ordered: Current medicines are reviewed at length with the patient today.  Concerns regarding medicines are outlined above.  Tests Ordered: Orders Placed This Encounter  Procedures  . EKG 12-Lead   Medication Changes: Meds ordered this encounter  Medications  . carvedilol (COREG) 6.25 MG tablet    Sig: Take 1.5 tablets (9.375 mg total) by mouth 2 (two) times daily.    Dispense:  90 tablet    Refill:  6    Please do not fill until patient calls for prescription    Signed, Richardson Dopp, PA-C  01/25/2019 3:40 PM    [address], [place], [place]  [address] Phone: [phone]; Fax: [phone]

## 2019-01-25 NOTE — Patient Instructions (Signed)
Medication Instructions:   START TAKING COREG 9.375 MG TWICE A DAY   If you need a refill on your cardiac medications before your next appointment, please call your pharmacy.   Lab work: NONE ORDERED  TODAY    If you have labs (blood work) drawn today and your tests are completely normal, you will receive your results only by: Marland Kitchen MyChart Message (if you have MyChart) OR . A paper copy in the mail If you have any lab test that is abnormal or we need to change your treatment, we will call you to review the results.  Testing/Procedures: RESCHEDULE  ECHO SOONER  BEFORE SURGERY 02-07-19    Follow-Up: IN 6 WEEKS AFTER ECHO  WITH [facility]   Any Other Special Instructions Will Be Listed Below (If Applicable).

## 2019-01-30 ENCOUNTER — Ambulatory Visit (HOSPITAL_COMMUNITY): Payer: Medicaid Other | Attending: Cardiology

## 2019-01-30 ENCOUNTER — Encounter: Payer: Self-pay | Admitting: Physician Assistant

## 2019-01-30 DIAGNOSIS — I5022 Chronic systolic (congestive) heart failure: Secondary | ICD-10-CM | POA: Insufficient documentation

## 2019-01-30 MED ORDER — PERFLUTREN LIPID MICROSPHERE
1.0000 mL | INTRAVENOUS | Status: AC | PRN
  Administered 2019-01-30: 2 mL via INTRAVENOUS

## 2019-01-31 ENCOUNTER — Telehealth: Payer: Self-pay

## 2019-01-31 NOTE — Telephone Encounter (Signed)
LMOVM TO CONTACT CLINIC BACK FOR RESULTS

## 2019-02-02 ENCOUNTER — Other Ambulatory Visit (HOSPITAL_COMMUNITY): Payer: Medicaid Other

## 2019-02-09 ENCOUNTER — Telehealth: Payer: Self-pay

## 2019-02-09 NOTE — Telephone Encounter (Signed)
Call placed to Pt's husband.  Advised of Pt upcoming appt being rescheduled.  Rescheduled Pt for mid April-afternoon appt per husband.  No further action needed.

## 2019-02-13 ENCOUNTER — Other Ambulatory Visit (HOSPITAL_COMMUNITY): Payer: Medicaid Other

## 2019-02-15 ENCOUNTER — Institutional Professional Consult (permissible substitution): Payer: Medicaid Other | Admitting: Internal Medicine

## 2019-02-21 ENCOUNTER — Other Ambulatory Visit (HOSPITAL_COMMUNITY): Payer: Medicaid Other

## 2019-03-06 ENCOUNTER — Telehealth: Payer: Self-pay | Admitting: *Deleted

## 2019-03-06 ENCOUNTER — Telehealth: Payer: Self-pay | Admitting: Internal Medicine

## 2019-03-06 NOTE — Telephone Encounter (Signed)
New message   Called patient to offer MyChart Visit. Pt is going to try and download the mychart app. Pt asked to be called back at 3 today.

## 2019-03-06 NOTE — Telephone Encounter (Signed)
LMOVM TO PT OF CONFIRMATION TO VISIT  FOR \\MY  CHART VIDEO

## 2019-03-07 ENCOUNTER — Telehealth (INDEPENDENT_AMBULATORY_CARE_PROVIDER_SITE_OTHER): Payer: Medicaid Other | Admitting: Internal Medicine

## 2019-03-07 ENCOUNTER — Telehealth: Payer: Self-pay | Admitting: Physician Assistant

## 2019-03-07 DIAGNOSIS — I5022 Chronic systolic (congestive) heart failure: Secondary | ICD-10-CM | POA: Diagnosis not present

## 2019-03-07 NOTE — Progress Notes (Signed)
Electrophysiology TeleHealth Note   Due to national recommendations of social distancing due to COVID 19, an audio/video telehealth visit is felt to be most appropriate for this patient at this time.  See MyChart message from today for the patient's consent to telehealth for [facility].   Date:  03/07/2019   ID:  Anne [name], DOB 16-Apr-1981, MRN [id number]  Location: patient's home  Provider location: [address] [place]  Evaluation Performed: Follow-up visit  PCP:  Zhou-Talbert, Anne Lade, MD  Cardiologist:  Anne Carnes, MD  Electrophysiologist:  Dr Anne [name]  Chief Complaint:  " My foot is getting better"  History of Present Illness:    Anne [name] is a 38 y.o. female who presents via audio/video conferencing for a telehealth visit today.  She isa pleasant 38 yo woman with longstanding DM, HTN , and CAD, s/p MI in January. She has done fairly well but does note an episode of syncope when she got up to go to the bathroom. She has an EF of 30% despite maximal medical therapy 3 months after her MI. She has had to have her Entresto reduced and then switched to losartan due to hypotension. She notes some PND and orthopnea. Apparently she presented late with her anterior MI.  The patient denies symptoms of fevers, chills, cough, or new SOB worrisome for COVID 19.  Past Medical History:  Diagnosis Date  . Acid reflux   . Anxiety   . Arthritis   . 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 CAD (coronary artery disease) 11/13/2018   Late presentation anterior MI 12/19 >> LHC - dLM 25, mLAD 99, oOM2 100 (R-L collats), irreg RCA, EF 25-35 >> PCI: POBA to mLAD  . Chronic systolic CHF (congestive heart failure) ([place]) 11/28/2018   Ischemic CM // late presentation ant MI 10/2018 tx with POBA to LAD (residual CAD with CTO of the OM2) // Echo 12/19:  No mural apical thrombus, septal, apical mid ant and inf HK; mild LVH, EF 30-35, mild MR // Echo  01/2019: EF 30-35, Gr 1 DD, diff HK, apical AK, mild MR   . CKD (chronic kidney disease), stage I   . Diabetes mellitus type 1 ([place])   . Former tobacco use   . Gastroparesis   . Hypercholesteremia   . Hypertension   . Osteomyelitis ([place])    a. s/p R forefoot amputation.  . Renal failure   . Sciatica     Past Surgical History:  Procedure Laterality Date  . AMPUTATION Right 11/03/2011   Procedure: AMPUTATION RAY;  Surgeon: Anne [name];  Location: [facility];  Service: General;  Laterality: Right;  Right fourth and fifth metatarsal   . APPENDECTOMY    . CESAREAN SECTION  2004 and 2007   x2  . CORONARY/GRAFT ACUTE MI REVASCULARIZATION N/A 11/11/2018   Procedure: CORONARY/GRAFT ACUTE MI REVASCULARIZATION;  Surgeon: Anne Booze, MD;  Location: [place] [facility];  Service: Cardiovascular;  Laterality: N/A;  . LEFT HEART CATH AND CORONARY ANGIOGRAPHY N/A 11/11/2018   Procedure: LEFT HEART CATH AND CORONARY ANGIOGRAPHY;  Surgeon: Anne Booze, MD;  Location: [place] [facility];  Service: Cardiovascular;  Laterality: N/A;  . TUBAL LIGATION      Current Outpatient Medications  Medication Sig Dispense Refill  . aspirin EC 81 MG tablet Take 1 tablet (81 mg total) by mouth daily. 90 tablet 3  . atorvastatin (  LIPITOR) 80 MG tablet Take 1 tablet (80 mg total) by mouth daily at 6 PM. 30 tablet 6  . carvedilol (COREG) 6.25 MG tablet Take 1.5 tablets (9.375 mg total) by mouth 2 (two) times daily. 90 tablet 6  . cephALEXin (KEFLEX) 500 MG capsule Take 1 capsule (500 mg total) by mouth 3 (three) times daily. 30 capsule 0  . colchicine 0.6 MG tablet Take 1 tablet (0.6 mg total) by mouth 2 (two) times daily. 60 tablet 2  . Exenatide ER (BYDUREON) 2 MG SRER Inject 2 mg into the skin once a week.    . furosemide (LASIX) 40 MG tablet Take 1 tablet (40 mg total) by mouth daily. 90 tablet 3  . gabapentin (NEURONTIN) 300 MG capsule Take 300 mg by mouth 3 (three) times daily as needed.    Marland Kitchen  LANTUS SOLOSTAR 100 UNIT/ML Solostar Pen INNJECT 50 UNITS S.Q. ONCE DAILY AT 10 P.M. 15 mL 2  . losartan (COZAAR) 25 MG tablet Take 1 tablet (25 mg total) by mouth daily. 90 tablet 3  . metFORMIN (GLUCOPHAGE) 500 MG tablet TAKE 1 TABLET BY MOUTH TWICE DAILY WITH MEALS. 60 tablet 2  . metoCLOPramide (REGLAN) 10 MG/10ML SOLN Take 10 mg by mouth 3 (three) times daily.     . Multiple Vitamin (MULTIVITAMIN WITH MINERALS) TABS tablet Take 1 tablet by mouth daily.    . nitroGLYCERIN (NITROSTAT) 0.4 MG SL tablet Place 1 tablet (0.4 mg total) under the tongue every 5 (five) minutes as needed. 25 tablet 12  . NOVOLOG FLEXPEN 100 UNIT/ML FlexPen INJECT 12-18 UNITS S.Q. THREE TIMES DAILY WITH MEALS. 15 mL 3  . ondansetron (ZOFRAN ODT) 4 MG disintegrating tablet Take 1 tablet (4 mg total) by mouth every 8 (eight) hours as needed for nausea or vomiting. 25 tablet 0  . ondansetron (ZOFRAN) 4 MG tablet Take 1 tablet (4 mg total) by mouth every 8 (eight) hours as needed. 6 tablet 0  . potassium chloride (K-DUR) 10 MEQ tablet Take 1 tablet (10 mEq total) by mouth daily. 90 tablet 3  . sertraline (ZOLOFT) 100 MG tablet Take 150 mg by mouth daily.     Marland Kitchen spironolactone (ALDACTONE) 25 MG tablet Take 0.5 tablets (12.5 mg total) by mouth daily. 30 tablet 6  . ticagrelor ([name]) 90 MG TABS tablet Take 1 tablet (90 mg total) by mouth 2 (two) times daily. 60 tablet 11   No current facility-administered medications for this visit.     Allergies:   Canagliflozin and Nsaids   Social History:  The patient  reports that she quit smoking about 5 years ago. Her smoking use included cigarettes. She has a 5.00 pack-year smoking history. She has never used smokeless tobacco. She reports that she does not drink alcohol or use drugs.   Family History:  The patient's  family history includes Alcoholism in her father; Anemia in her mother; Asthma in her mother; Diabetes in her father, paternal grandfather, and paternal grandmother;  Heart attack in her paternal grandmother; Hypertension in her mother; Kidney disease in her mother; Lung cancer in her father.   ROS:  Please see the history of present illness.   All other systems are personally reviewed and negative.    Exam:    Vital Signs:   Well appearing, alert and conversant, regular work of breathing,  good skin color Eyes- anicteric, neuro- grossly intact, skin- no apparent rash or lesions or cyanosis, mouth- oral mucosa is pink   Labs/Other Tests and  Data Reviewed:    Recent Labs: 11/12/2018: B Natriuretic Peptide 500.8 12/21/2018: NT-Pro BNP 2,669 01/09/2019: ALT 27; Hemoglobin 10.4; Platelets 370 01/16/2019: BUN 17; Creatinine, Ser 1.59; Potassium 4.9; Sodium 138   Wt Readings from Last 3 Encounters:  01/25/19 196 lb (88.9 kg)  01/08/19 188 lb (85.3 kg)  12/21/18 198 lb (89.8 kg)     Other studies personally reviewed: 2D echo   ASSESSMENT & PLAN:    1.  Chronic systolic heart failure - I discussed the treatment opptions and recommend ICD insertion. She is still healing her foot. She has had a prolonged course of anti-biotics. She is instructed to call us when she has healed. Our office will call her in early May to schedule device implant. 2. Syncope - her spells do not sound arrhythmic in nature.  3. DM - her last HGBA1C was 7. She will continue her current meds. 4. COVID 19 screen The patient denies symptoms of COVID 19 at this time.  The importance of social distancing was discussed today.  Follow-up:  After ICD insertion Next remote: NA  Current medicines are reviewed at length with the patient today.   The patient does not have concerns regarding her medicines.  The following changes were made today:  none  Labs/ tests ordered today include: none No orders of the defined types were placed in this encounter.    Patient Risk:  after full review of this patients clinical status, I feel that they are at moderate risk at this time.  Today,  I have spent 25 minutes with the patient with telehealth technology discussing the above.    Signed, Cristopher Peru, MD  03/07/2019 2:50 PM     [address] [place] [address] [phone] (office) [phone] (fax)

## 2019-03-07 NOTE — Telephone Encounter (Signed)
° °  4/14 :  Video Visit - MyChart App  Consent confirmed via [place]

## 2019-03-08 ENCOUNTER — Telehealth (INDEPENDENT_AMBULATORY_CARE_PROVIDER_SITE_OTHER): Payer: Medicaid Other | Admitting: Physician Assistant

## 2019-03-08 ENCOUNTER — Encounter: Payer: Self-pay | Admitting: Physician Assistant

## 2019-03-08 VITALS — BP 142/93 | HR 72 | Ht 70.0 in | Wt 194.0 lb

## 2019-03-08 DIAGNOSIS — I10 Essential (primary) hypertension: Secondary | ICD-10-CM | POA: Diagnosis not present

## 2019-03-08 DIAGNOSIS — M869 Osteomyelitis, unspecified: Secondary | ICD-10-CM | POA: Diagnosis not present

## 2019-03-08 DIAGNOSIS — Z7189 Other specified counseling: Secondary | ICD-10-CM

## 2019-03-08 DIAGNOSIS — I2510 Atherosclerotic heart disease of native coronary artery without angina pectoris: Secondary | ICD-10-CM

## 2019-03-08 DIAGNOSIS — I5022 Chronic systolic (congestive) heart failure: Secondary | ICD-10-CM | POA: Diagnosis not present

## 2019-03-08 NOTE — Progress Notes (Signed)
Virtual Visit via Video Note   This visit type was conducted due to national recommendations for restrictions regarding the COVID-19 Pandemic (e.g. social distancing) in an effort to limit this patient's exposure and mitigate transmission in our community.  Due to her co-morbid illnesses, this patient is at least at moderate risk for complications without adequate follow up.  This format is felt to be most appropriate for this patient at this time.  All issues noted in this document were discussed and addressed.  A limited physical exam was performed with this format.  Please refer to the patient's chart for her consent to telehealth for [facility].   Evaluation Performed:  Follow-up visit  Date:  03/08/2019   ID:  Anne [name], DOB [birth year], MRN [id number]  Patient Location: Home Provider Location: Home  PCP:  Zhou-Talbert, Elwyn Lade, MD  Cardiologist:  Dorris Carnes, MD  Electrophysiologist:  Cristopher Peru, MD   Chief Complaint: Follow-up on CAD and CHF  History of Present Illness:    Anne [name] is a 38 y.o. 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 She was admitted in 10/2018 with a late presentation anterior MI c/b post MI pericarditis. Cardiac Cath demonstrated diffuse dz in the LAD and LCx.  She underwent POBA to the LAD b/c it was not large enough for stenting or CABG. EF was 30-35. She has been unable to tolerate Entresto secondary to hypotension.  Follow-up echocardiogram in March 2020 demonstrated EF 30-35%.  She has been referred to electrophysiology.  She was seen by Dr. Lovena Le yesterday.  She will need ICD implantation once her foot wound has healed.  Today, she notes a few episodes of interscapular back pain with associated shortness of breath.  She also notes worsening shortness of breath exertion.  She did not have  lower extremity swelling.  Her weights have been stable.  She thinks that she may have some increased abdominal girth.  She denies syncope.  She is not sure if her symptoms are related to anxiety.  However, she did have similar symptoms that she thinks may have been her myocardial infarction prior to presenting to the hospital.  She has not tried nitroglycerin.  The patient does not have symptoms concerning for COVID-19 infection (fever, chills, cough, or new shortness of breath).    Past Medical History:  Diagnosis Date  . Acid reflux   . Anxiety   . Arthritis   . 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 CAD (coronary artery disease) 11/13/2018   Late presentation anterior MI 12/19 >> LHC - dLM 25, mLAD 99, oOM2 100 (R-L collats), irreg RCA, EF 25-35 >> PCI: POBA to mLAD  . Chronic systolic CHF (congestive heart failure) ([place]) 11/28/2018   Ischemic CM // late presentation ant MI 10/2018 tx with POBA to LAD (residual CAD with CTO of the OM2) // Echo 12/19:  No mural apical thrombus, septal, apical mid ant and inf HK; mild LVH, EF 30-35, mild MR // Echo 01/2019: EF 30-35, Gr 1 DD, diff HK, apical AK, mild MR   . CKD (chronic kidney disease), stage I   . Diabetes mellitus type 1 ([place])   . Former tobacco use   . Gastroparesis   . Hypercholesteremia   . Hypertension   . Osteomyelitis ([place])    a. s/p R forefoot amputation.  . Renal failure   .  Sciatica    Past Surgical History:  Procedure Laterality Date  . AMPUTATION Right 11/03/2011   Procedure: AMPUTATION RAY;  Surgeon: Jamesetta So;  Location: [facility];  Service: General;  Laterality: Right;  Right fourth and fifth metatarsal   . APPENDECTOMY    . CESAREAN SECTION  2004 and 2007   x2  . CORONARY/GRAFT ACUTE MI REVASCULARIZATION N/A 11/11/2018   Procedure: CORONARY/GRAFT ACUTE MI REVASCULARIZATION;  Surgeon: Jettie Booze, MD;  Location: [place] [facility];  Service: Cardiovascular;  Laterality: N/A;   . LEFT HEART CATH AND CORONARY ANGIOGRAPHY N/A 11/11/2018   Procedure: LEFT HEART CATH AND CORONARY ANGIOGRAPHY;  Surgeon: Jettie Booze, MD;  Location: [place] [facility];  Service: Cardiovascular;  Laterality: N/A;  . TUBAL LIGATION       Current Meds  Medication Sig  . aspirin EC 81 MG tablet Take 1 tablet (81 mg total) by mouth daily.  Marland Kitchen atorvastatin (LIPITOR) 80 MG tablet Take 1 tablet (80 mg total) by mouth daily at 6 PM.  . carvedilol (COREG) 6.25 MG tablet Take 1.5 tablets (9.375 mg total) by mouth 2 (two) times daily.  . cephALEXin (KEFLEX) 500 MG capsule Take 1 capsule (500 mg total) by mouth 3 (three) times daily.  . colchicine 0.6 MG tablet Take 1 tablet (0.6 mg total) by mouth 2 (two) times daily.  . Exenatide ER (BYDUREON) 2 MG SRER Inject 2 mg into the skin once a week.  . furosemide (LASIX) 40 MG tablet Take 1 tablet (40 mg total) by mouth daily.  Marland Kitchen gabapentin (NEURONTIN) 300 MG capsule Take 300 mg by mouth 3 (three) times daily as needed.  Marland Kitchen glucose blood test strip USE TO TEST BLOOD SUGAR 4 TIMES DAILY.  Marland Kitchen LANTUS SOLOSTAR 100 UNIT/ML Solostar Pen INNJECT 50 UNITS S.Q. ONCE DAILY AT 10 P.M.  . losartan (COZAAR) 25 MG tablet Take 1 tablet (25 mg total) by mouth daily.  . metFORMIN (GLUCOPHAGE) 500 MG tablet TAKE 1 TABLET BY MOUTH TWICE DAILY WITH MEALS.  Marland Kitchen metoCLOPramide (REGLAN) 10 MG/10ML SOLN Take 10 mg by mouth 3 (three) times daily.   . Multiple Vitamin (MULTIVITAMIN WITH MINERALS) TABS tablet Take 1 tablet by mouth daily.  . nitroGLYCERIN (NITROSTAT) 0.4 MG SL tablet Place 1 tablet (0.4 mg total) under the tongue every 5 (five) minutes as needed.  Marland Kitchen NOVOLOG FLEXPEN 100 UNIT/ML FlexPen INJECT 12-18 UNITS S.Q. THREE TIMES DAILY WITH MEALS.  Marland Kitchen ondansetron (ZOFRAN ODT) 4 MG disintegrating tablet Take 1 tablet (4 mg total) by mouth every 8 (eight) hours as needed for nausea or vomiting.  . potassium chloride (K-DUR) 10 MEQ tablet Take 1 tablet (10 mEq total) by mouth  daily.  . rifampin (RIFADIN) 300 MG capsule Take 300 mg by mouth daily.  . sertraline (ZOLOFT) 100 MG tablet Take 200 mg by mouth daily.   Marland Kitchen spironolactone (ALDACTONE) 25 MG tablet Take 0.5 tablets (12.5 mg total) by mouth daily.  Marland Kitchen sulfamethoxazole-trimethoprim (BACTRIM DS,SEPTRA DS) 800-160 MG tablet   . ticagrelor ([name]) 90 MG TABS tablet Take 1 tablet (90 mg total) by mouth 2 (two) times daily.     Allergies:   Canagliflozin and Nsaids   Social History   Tobacco Use  . Smoking status: Former Smoker    Packs/day: 0.25    Years: 20.00    Pack years: 5.00    Types: Cigarettes    Last attempt to quit: 12/29/2013    Years since quitting: 5.1  .  Smokeless tobacco: Never Used  Substance Use Topics  . Alcohol use: No  . Drug use: No     Family Hx: The patient's family history includes Alcoholism in her father; Anemia in her mother; Asthma in her mother; Diabetes in her father, paternal grandfather, and paternal grandmother; Heart attack in her paternal grandmother; Hypertension in her mother; Kidney disease in her mother; Lung cancer in her father. There is no history of Anesthesia problems, Hypotension, Malignant hyperthermia, or Pseudochol deficiency.  ROS:   Please see the history of present illness.    All other systems reviewed and are negative.   Prior CV studies:   The following studies were reviewed today:  Echocardiogram 01/30/2019 EF 30-35, impaired relaxation (grade 1 diastolic dysfunction, apical akinesis, normal RV SF  Echo 11/13/18 No mural apical thrombus, septal, apical mid ant and inf HK; mild LVH, EF 30-35, mild MR  Cardiac Catheterization12/20/19 LM dist 25 LAD mid 99 OM2 ost 100 - CTO; OM2 filled by collats from post atrio RCA irregs EF 25-35 PCI: POBA to mid LAD   Echo 11/28/15 EF 55, no RWMA, trivial eff  Labs/Other Tests and Data Reviewed:    EKG:  No ECG reviewed.  Recent Labs: 11/12/2018: B Natriuretic Peptide 500.8 12/21/2018:  NT-Pro BNP 2,669 01/09/2019: ALT 27; Hemoglobin 10.4; Platelets 370 01/16/2019: BUN 17; Creatinine, Ser 1.59; Potassium 4.9; Sodium 138   Recent Lipid Panel Lab Results  Component Value Date/Time   CHOL 116 01/16/2019 01:47 PM   TRIG 179 (H) 01/16/2019 01:47 PM   HDL 27 (L) 01/16/2019 01:47 PM   CHOLHDL 4.3 01/16/2019 01:47 PM   CHOLHDL 6.8 11/11/2018 01:04 PM   LDLCALC 53 01/16/2019 01:47 PM   LDLDIRECT 98 02/10/2012 12:30 PM    Wt Readings from Last 3 Encounters:  03/08/19 194 lb (88 kg)  01/25/19 196 lb (88.9 kg)  01/08/19 188 lb (85.3 kg)     Objective:    Vital Signs:  BP (!) 142/93   Pulse 72   Ht 5\' 10"  (1.778 m)   Wt 194 lb (88 kg)   BMI 27.84 kg/m    GEN:  During our conversation, the patient does not seem to be in acute distress.    RESP:  Respiratory effort seems to be normal.    NEURO:  Alert and Oriented.    PSYCH:  The patient is in good spirits.      ASSESSMENT & PLAN:    Coronary artery disease involving native coronary artery of native heart without angina pectoris S/p late presentation anterior MI in 10/2018 tx with POBA to the LAD.  She does have residual disease with a chronically occluded second obtuse marginal and 25% distal left main stenosis.She has had some recent interscapular back pain with associated shortness of breath.  Question if this is an anginal equivalent.  Her blood pressure is limited titration of her medications in the past.  It sounds as though she is experiencing some volume excess.  I will adjust her Lasix as outlined below.  We will touch base again next week.  If her blood pressure will allow, I will consider increasing her carvedilol to 12.5 mg twice daily.  Continue aspirin, [name], atorvastatin.  Chronic systolic CHF (congestive heart failure) ([place]) She does have some symptoms of volume excess.  I have asked her to increase her Lasix to 40 mg twice daily for 3 days along with potassium 10 mEq twice daily for 3 days.  After 3  days, she  will resume her normal dose of Lasix.  We will follow-up with her next week.  If she feels better on twice daily Lasix, we will continue this and obtain follow-up BMET.  Continue beta-blocker, ARB, spironolactone.  As noted, she has seen Dr. Lovena Le and the plan is for ICD implantation once her foot has healed.  Essential hypertension Blood pressure somewhat elevated today.  However, it is usually in the 1 teens.  Continue to monitor blood pressure.  Osteomyelitis, unspecified site, unspecified type ([place]) Her foot is currently healing.  COVID-19 Education: The signs and symptoms of COVID-19 were discussed with the patient and how to seek care for testing (follow up with PCP or arrange E-visit).  The importance of social distancing was discussed today.  Time:   Today, I have spent 20 minutes with the patient with telehealth technology discussing the above problems.     Medication Adjustments/Labs and Tests Ordered: Current medicines are reviewed at length with the patient today.  Concerns regarding medicines are outlined above.   Tests Ordered: No orders of the defined types were placed in this encounter.   Medication Changes: No orders of the defined types were placed in this encounter.   Disposition:  Follow up in 4 week(s)  Signed, Richardson Dopp, PA-C  03/08/2019 2:25 PM    [facility]

## 2019-03-08 NOTE — Patient Instructions (Signed)
Medication Instructions:  1. Increase Lasix to 40 mg twice daily for 3 days (take the Lasix in the morning and then 6 hours later), then resume 40 mg once daily   2. Increase potassium to 10 mEq twice daily for 3 days, then resume 10 mEq once daily    3. You can take nitroglycerin as needed for you back pain and shortness of breath.  If you take one, sit down and put the tablet under your tongue.  Wait 5 min between doses.  If you feel you have to take a 3rd tablet because the pain and shortness of breath is not resolved, call 911 first.  If you need a refill on your cardiac medications before your next appointment, please call your pharmacy.   Lab work: None   If you have labs (blood work) drawn today and your tests are completely normal, you will receive your results only by: Marland Kitchen MyChart Message (if you have MyChart) OR . A paper copy in the mail If you have any lab test that is abnormal or we need to change your treatment, we will call you to review the results.  Testing/Procedures: None    Follow-Up: At [facility], you and your health needs are our priority.  As part of our continuing mission to provide you with exceptional heart care, we have created designated Provider Care Teams.  These Care Teams include your primary Cardiologist (physician) and Advanced Practice Providers (APPs -  Physician Assistants and Nurse Practitioners) who all work together to provide you with the care you need, when you need it. Richardson Dopp, PA-C in 4 weeks  Any Other Special Instructions Will Be Listed Below (If Applicable).  Check your blood pressure 1-2 times a day and record. Call us on Monday to let us know how you are feeling and tell us what your blood pressure has been running.

## 2019-03-09 ENCOUNTER — Other Ambulatory Visit: Payer: Self-pay | Admitting: Physician Assistant

## 2019-03-13 ENCOUNTER — Telehealth: Payer: Self-pay | Admitting: *Deleted

## 2019-03-13 NOTE — Telephone Encounter (Signed)
SPOKE WITH PT AND PT IS DOING WELL WITH EXTRA LASIX IT HAS HELPED WITH HER SYMPTOMS.  PT BP RANGES ARE LOW 100'S OVER 60'S

## 2019-03-14 NOTE — Telephone Encounter (Signed)
Since she feels better, continue:  Lasix 40 mg twice daily   Potassium 10 mEq twice daily  Please arrange a follow up BMET.  If we can arrange it through [facility] Adela Lank), please do.  Otherwise, we will just need to schedule her a time in the office. Richardson Dopp, PA-C    03/14/2019 8:56 AM

## 2019-03-17 ENCOUNTER — Telehealth: Payer: Self-pay | Admitting: *Deleted

## 2019-03-17 DIAGNOSIS — I5022 Chronic systolic (congestive) heart failure: Secondary | ICD-10-CM

## 2019-03-17 MED ORDER — POTASSIUM CHLORIDE ER 10 MEQ PO TBCR: 10.0000 meq | EXTENDED_RELEASE_TABLET | Freq: Two times a day (BID) | ORAL | 1 refills | Status: DC

## 2019-03-17 MED ORDER — FUROSEMIDE 40 MG PO TABS: 40.0000 mg | ORAL_TABLET | Freq: Two times a day (BID) | ORAL | 1 refills | Status: DC

## 2019-03-17 NOTE — Telephone Encounter (Signed)
SPOKE WITH PT AND PT AWARE OF CHANGES  FOLLOW UP LABS 5-1

## 2019-03-24 ENCOUNTER — Other Ambulatory Visit: Payer: Medicaid Other

## 2019-03-24 ENCOUNTER — Other Ambulatory Visit: Payer: Self-pay | Admitting: Physician Assistant

## 2019-04-10 ENCOUNTER — Telehealth: Payer: Self-pay | Admitting: Internal Medicine

## 2019-04-10 NOTE — Telephone Encounter (Signed)
New Message   Patient is calling to see when her defibrillator will be scheduled. Please call to discuss.

## 2019-04-11 ENCOUNTER — Other Ambulatory Visit: Payer: Medicaid Other

## 2019-04-14 NOTE — Telephone Encounter (Signed)
Returned call to Pt.  Advised not time for her to go forward with her procedure yet per Dr. Lovena Le.  Asked Pt about her foot wound.  She said the wound she had when she talked with Dr. Lovena Le has healed but now she has another open wound on the side of her foot she is trying to heal.  Advised she would need to have no open wounds before procedure could be safely done.  Pt indicates understanding.  Advised would call her to schedule.

## 2019-05-03 ENCOUNTER — Emergency Department (HOSPITAL_COMMUNITY): Payer: Medicaid Other

## 2019-05-03 ENCOUNTER — Other Ambulatory Visit: Payer: Self-pay

## 2019-05-03 ENCOUNTER — Encounter (HOSPITAL_COMMUNITY): Payer: Self-pay | Admitting: Emergency Medicine

## 2019-05-03 ENCOUNTER — Observation Stay (HOSPITAL_COMMUNITY)
Admission: EM | Admit: 2019-05-03 | Discharge: 2019-05-04 | Disposition: A | Discharge status: Home / Self Care | Payer: Medicaid Other | Attending: Family Medicine | Admitting: Family Medicine

## 2019-05-03 ENCOUNTER — Telehealth: Payer: Self-pay | Admitting: Internal Medicine

## 2019-05-03 DIAGNOSIS — Z87891 Personal history of nicotine dependence: Secondary | ICD-10-CM | POA: Insufficient documentation

## 2019-05-03 DIAGNOSIS — I130 Hypertensive heart and chronic kidney disease with heart failure and stage 1 through stage 4 chronic kidney disease, or unspecified chronic kidney disease: Secondary | ICD-10-CM | POA: Insufficient documentation

## 2019-05-03 DIAGNOSIS — Z7982 Long term (current) use of aspirin: Secondary | ICD-10-CM | POA: Diagnosis not present

## 2019-05-03 DIAGNOSIS — Z20828 Contact with and (suspected) exposure to other viral communicable diseases: Secondary | ICD-10-CM | POA: Diagnosis not present

## 2019-05-03 DIAGNOSIS — I5022 Chronic systolic (congestive) heart failure: Secondary | ICD-10-CM | POA: Insufficient documentation

## 2019-05-03 DIAGNOSIS — E1122 Type 2 diabetes mellitus with diabetic chronic kidney disease: Secondary | ICD-10-CM | POA: Insufficient documentation

## 2019-05-03 DIAGNOSIS — I2510 Atherosclerotic heart disease of native coronary artery without angina pectoris: Secondary | ICD-10-CM | POA: Insufficient documentation

## 2019-05-03 DIAGNOSIS — R079 Chest pain, unspecified: Secondary | ICD-10-CM | POA: Diagnosis present

## 2019-05-03 DIAGNOSIS — Z79899 Other long term (current) drug therapy: Secondary | ICD-10-CM | POA: Diagnosis not present

## 2019-05-03 DIAGNOSIS — N181 Chronic kidney disease, stage 1: Secondary | ICD-10-CM | POA: Diagnosis not present

## 2019-05-03 DIAGNOSIS — Z794 Long term (current) use of insulin: Secondary | ICD-10-CM | POA: Insufficient documentation

## 2019-05-03 DIAGNOSIS — E1159 Type 2 diabetes mellitus with other circulatory complications: Secondary | ICD-10-CM

## 2019-05-03 DIAGNOSIS — I200 Unstable angina: Principal | ICD-10-CM | POA: Insufficient documentation

## 2019-05-03 LAB — BASIC METABOLIC PANEL
Anion gap: 12 (ref 5–15)
BUN: 24 mg/dL — ABNORMAL HIGH (ref 6–20)
CO2: 29 mmol/L (ref 22–32)
Calcium: 9.3 mg/dL (ref 8.9–10.3)
Chloride: 96 mmol/L — ABNORMAL LOW (ref 98–111)
Creatinine, Ser: 1.6 mg/dL — ABNORMAL HIGH (ref 0.44–1.00)
GFR calc Af Amer: 47 mL/min — ABNORMAL LOW (ref >60)
GFR calc non Af Amer: 40 mL/min — ABNORMAL LOW (ref >60)
Glucose, Bld: 200 mg/dL — ABNORMAL HIGH (ref 70–99)
Potassium: 3.9 mmol/L (ref 3.5–5.1)
Sodium: 137 mmol/L (ref 135–145)

## 2019-05-03 LAB — CBC
HCT: 37.2 % (ref 36.0–46.0)
Hemoglobin: 12.8 g/dL (ref 12.0–15.0)
MCH: 31.1 pg (ref 26.0–34.0)
MCHC: 34.4 g/dL (ref 30.0–36.0)
MCV: 90.3 fL (ref 80.0–100.0)
Platelets: 321 10*3/uL (ref 150–400)
RBC: 4.12 MIL/uL (ref 3.87–5.11)
RDW: 12.9 % (ref 11.5–15.5)
WBC: 9.4 10*3/uL (ref 4.0–10.5)
nRBC: 0 % (ref 0.0–0.2)

## 2019-05-03 LAB — GLUCOSE, CAPILLARY
Glucose-Capillary: 213 mg/dL — ABNORMAL HIGH (ref 70–99)
Glucose-Capillary: 250 mg/dL — ABNORMAL HIGH (ref 70–99)
Glucose-Capillary: 269 mg/dL — ABNORMAL HIGH (ref 70–99)

## 2019-05-03 LAB — TROPONIN I
Troponin I: <0.03 ng/mL (ref <0.03)
Troponin I: <0.03 ng/mL (ref <0.03)

## 2019-05-03 MED ORDER — METOCLOPRAMIDE HCL 10 MG/10ML PO SOLN: 10.0000 mg | Freq: Three times a day (TID) | ORAL | Status: DC | PRN

## 2019-05-03 MED ORDER — SULFAMETHOXAZOLE-TRIMETHOPRIM 800-160 MG PO TABS
1.0000 | ORAL_TABLET | Freq: Two times a day (BID) | ORAL | Status: DC
  Administered 2019-05-03 – 2019-05-04 (×2): 1 via ORAL
  Filled 2019-05-03 (×2): qty 1

## 2019-05-03 MED ORDER — EXENATIDE ER 2 MG ~~LOC~~ SRER: 2.0000 mg | SUBCUTANEOUS | Status: DC

## 2019-05-03 MED ORDER — SPIRONOLACTONE 12.5 MG HALF TABLET
12.5000 mg | ORAL_TABLET | Freq: Every day | ORAL | Status: DC
  Administered 2019-05-04: 12.5 mg via ORAL
  Filled 2019-05-03 (×2): qty 1

## 2019-05-03 MED ORDER — ONDANSETRON HCL 4 MG PO TABS
4.0000 mg | ORAL_TABLET | Freq: Four times a day (QID) | ORAL | Status: DC | PRN
  Administered 2019-05-03 – 2019-05-04 (×2): 4 mg via ORAL
  Filled 2019-05-03 (×2): qty 1

## 2019-05-03 MED ORDER — MORPHINE SULFATE (PF) 2 MG/ML IV SOLN
2.0000 mg | INTRAVENOUS | Status: DC | PRN
  Filled 2019-05-03: qty 1

## 2019-05-03 MED ORDER — SODIUM CHLORIDE 0.9% FLUSH
3.0000 mL | Freq: Once | INTRAVENOUS | Status: AC
  Administered 2019-05-03: 3 mL via INTRAVENOUS

## 2019-05-03 MED ORDER — ACETAMINOPHEN 650 MG RE SUPP: 650.0000 mg | Freq: Four times a day (QID) | RECTAL | Status: DC | PRN

## 2019-05-03 MED ORDER — FUROSEMIDE 40 MG PO TABS
40.0000 mg | ORAL_TABLET | Freq: Two times a day (BID) | ORAL | Status: DC
  Administered 2019-05-04: 40 mg via ORAL
  Filled 2019-05-03: qty 1

## 2019-05-03 MED ORDER — BISACODYL 10 MG RE SUPP: 10.0000 mg | Freq: Every day | RECTAL | Status: DC | PRN

## 2019-05-03 MED ORDER — POTASSIUM CHLORIDE CRYS ER 10 MEQ PO TBCR
10.0000 meq | EXTENDED_RELEASE_TABLET | Freq: Two times a day (BID) | ORAL | Status: DC
  Administered 2019-05-03 – 2019-05-04 (×2): 10 meq via ORAL
  Filled 2019-05-03 (×6): qty 1

## 2019-05-03 MED ORDER — NITROGLYCERIN 0.4 MG SL SUBL
0.4000 mg | SUBLINGUAL_TABLET | SUBLINGUAL | Status: DC | PRN
  Administered 2019-05-03: 0.4 mg via SUBLINGUAL
  Filled 2019-05-03: qty 1

## 2019-05-03 MED ORDER — ONDANSETRON HCL 4 MG/2ML IJ SOLN: 4.0000 mg | Freq: Four times a day (QID) | INTRAMUSCULAR | Status: DC | PRN

## 2019-05-03 MED ORDER — ASPIRIN EC 81 MG PO TBEC
81.0000 mg | DELAYED_RELEASE_TABLET | Freq: Every day | ORAL | Status: DC
  Administered 2019-05-04: 81 mg via ORAL
  Filled 2019-05-03 (×2): qty 1

## 2019-05-03 MED ORDER — LOSARTAN POTASSIUM 50 MG PO TABS
25.0000 mg | ORAL_TABLET | Freq: Every evening | ORAL | Status: DC
  Administered 2019-05-03: 25 mg via ORAL
  Filled 2019-05-03: qty 1

## 2019-05-03 MED ORDER — CARVEDILOL 3.125 MG PO TABS
9.3750 mg | ORAL_TABLET | Freq: Two times a day (BID) | ORAL | Status: DC
  Administered 2019-05-03 – 2019-05-04 (×2): 9.375 mg via ORAL
  Filled 2019-05-03 (×2): qty 3

## 2019-05-03 MED ORDER — ENOXAPARIN SODIUM 40 MG/0.4ML ~~LOC~~ SOLN
40.0000 mg | SUBCUTANEOUS | Status: DC
  Administered 2019-05-03: 40 mg via SUBCUTANEOUS
  Filled 2019-05-03: qty 0.4

## 2019-05-03 MED ORDER — INSULIN GLARGINE 100 UNIT/ML ~~LOC~~ SOLN
20.0000 [IU] | Freq: Every day | SUBCUTANEOUS | Status: DC
  Administered 2019-05-03: 20 [IU] via SUBCUTANEOUS
  Filled 2019-05-03 (×2): qty 0.2

## 2019-05-03 MED ORDER — SERTRALINE HCL 50 MG PO TABS
200.0000 mg | ORAL_TABLET | Freq: Every day | ORAL | Status: DC
  Administered 2019-05-03: 200 mg via ORAL
  Filled 2019-05-03: qty 4

## 2019-05-03 MED ORDER — INSULIN ASPART 100 UNIT/ML ~~LOC~~ SOLN
0.0000 [IU] | SUBCUTANEOUS | Status: DC
  Administered 2019-05-03: 5 [IU] via SUBCUTANEOUS
  Administered 2019-05-03 – 2019-05-04 (×2): 3 [IU] via SUBCUTANEOUS
  Administered 2019-05-04 (×2): 2 [IU] via SUBCUTANEOUS

## 2019-05-03 MED ORDER — ATORVASTATIN CALCIUM 40 MG PO TABS: 80.0000 mg | ORAL_TABLET | Freq: Every day | ORAL | Status: DC

## 2019-05-03 MED ORDER — TICAGRELOR 90 MG PO TABS
90.0000 mg | ORAL_TABLET | Freq: Two times a day (BID) | ORAL | Status: DC
  Administered 2019-05-03 – 2019-05-04 (×2): 90 mg via ORAL
  Filled 2019-05-03 (×2): qty 1

## 2019-05-03 MED ORDER — ACETAMINOPHEN 325 MG PO TABS: 650.0000 mg | ORAL_TABLET | Freq: Four times a day (QID) | ORAL | Status: DC | PRN

## 2019-05-03 MED ORDER — SODIUM CHLORIDE 0.9 % IV BOLUS
500.0000 mL | Freq: Once | INTRAVENOUS | Status: AC
  Administered 2019-05-03: 500 mL via INTRAVENOUS

## 2019-05-03 NOTE — ED Notes (Signed)
Pt sleeping with equal rise and chest fall  

## 2019-05-03 NOTE — ED Triage Notes (Signed)
Patient complains of chest pain that started last night. She states crushing chest pain last night and had to take nitro sublingual. 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 States the pain resolved after two nitro pills. Patient states nausea from pain with one episode of vomiting.

## 2019-05-03 NOTE — Telephone Encounter (Signed)
New Message     Pt c/o of Chest Pain: STAT if CP now or developed within 24 hours  1. Are you having CP right now? Not now but she doesn't feel like she can get a full breath of air, but shes not out of air. She said she knows she doesn't feel normal   2. Are you experiencing any other symptoms (ex. SOB, nausea, vomiting, sweating)? Yes, vomited last night, she went to sleep and woke up and threw up. She said she did take another Nitro and then felt better again and went back to sleep. And Sweating, a little SOB   3. How long have you been experiencing CP? Started last night. She said earlier in the day it felt like a cramp   4. Is your CP continuous or coming and going? She said it felt like someone was layin on her chest and pushing into her. She said she felt like she couldn't get enough air. She says it was pretty continuous up until she took the Nitroglycerin.   5. Have you taken Nitroglycerin? Yes, she used it last night. She thought she was having a panic attack but after using it 3-5 minutes after she felt better  ?

## 2019-05-03 NOTE — H&P (Addendum)
History and Physical    Anne [name] XBJ:[id number] DOB: 1981/01/10 DOA: 05/03/2019  PCP: Alfonse Flavors, MD   Patient coming from: Home  I have personally briefly reviewed patient's old medical records in [place]  Chief Complaint: Chest Discomfort  HPI: Anne [name] is a 38 y.o. female with medical history significant for recent ST elevation MI 10/2018, [place] mellitus, hypertension, systolic CHF, who presented to the ED with complaints of chest pain that started last night.  First episode started when she was sitting down, and then she had a second one from sleep.  She vomited after she had chest pain the second time.  She describes it more as a chest discomforts, and radiating, that is described as pressure-like " Like someone sitting on her chest" . And is associated with difficulty breathing.  Symptoms are also provoked by exertion and feel similar to when she had a MI 11/11/2018.  Pain resolved at home and here in the ED with nitroglycerin. One week ago, Patient's daughter mistakenly displaced patient's pills, and try to put them back in the pillbox, but it was started to which p.o. went where. Because of this, though patient has been taking her medications, she is unsure if she has actually been taking her [name] twice a day.   ED Course: Stable vitals.  Troponin less than 0.03.  Chest x-ray clear.  EKG sinus rhythm, no ST or T wave abnormalities.  Creatinine consistent with baseline 1.6.  Unremarkable CBC.  Improvement in chest pain.  500 mill bolus normal saline given. EDP to Dr. Conni Elliot who recommended admission here, to see in a.m.  Review of Systems: As per HPI all other systems reviewed and negative.  Past Medical History:  Diagnosis Date  . Acid reflux   . Anxiety   . Arthritis   . 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 CAD (coronary artery disease) 11/13/2018   Late presentation anterior MI 12/19 >> LHC - dLM 25,  mLAD 99, oOM2 100 (R-L collats), irreg RCA, EF 25-35 >> PCI: POBA to mLAD  . Chronic systolic CHF (congestive heart failure) ([place]) 11/28/2018   Ischemic CM // late presentation ant MI 10/2018 tx with POBA to LAD (residual CAD with CTO of the OM2) // Echo 12/19:  No mural apical thrombus, septal, apical mid ant and inf HK; mild LVH, EF 30-35, mild MR // Echo 01/2019: EF 30-35, Gr 1 DD, diff HK, apical AK, mild MR   . CKD (chronic kidney disease), stage I   . Diabetes mellitus type 1 ([place])   . Former tobacco use   . Gastroparesis   . Hypercholesteremia   . Hypertension   . Osteomyelitis ([place])    a. s/p R forefoot amputation.  . Renal failure   . Sciatica     Past Surgical History:  Procedure Laterality Date  . AMPUTATION Right 11/03/2011   Procedure: AMPUTATION RAY;  Surgeon: Jamesetta So;  Location: [facility];  Service: General;  Laterality: Right;  Right fourth and fifth metatarsal   . APPENDECTOMY    . CESAREAN SECTION  2004 and 2007   x2  . CORONARY/GRAFT ACUTE MI REVASCULARIZATION N/A 11/11/2018   Procedure: CORONARY/GRAFT ACUTE MI REVASCULARIZATION;  Surgeon: Jettie Booze, MD;  Location: [place];  Service: Cardiovascular;  Laterality: N/A;  . LEFT HEART CATH AND CORONARY ANGIOGRAPHY N/A 11/11/2018   Procedure: LEFT HEART CATH AND CORONARY ANGIOGRAPHY;  Surgeon: Jettie Booze, MD;  Location: [facility];  Service: Cardiovascular;  Laterality: N/A;  . TUBAL LIGATION       reports that she quit smoking about 5 years ago. Her smoking use included cigarettes. She has a 5.00 pack-year smoking history. She has never used smokeless tobacco. She reports that she does not drink alcohol or use drugs.  Allergies  Allergen Reactions  . Canagliflozin Other (See Comments)    Vaginal yeast infections  . Nsaids Other (See Comments)    Yeast infection     Family History  Problem Relation Age of Onset  . Diabetes Father   . Lung cancer Father   . Alcoholism  Father   . Asthma Mother   . Kidney disease Mother   . Anemia Mother        hemolytic  . Hypertension Mother   . Heart attack Paternal Grandmother   . Diabetes Paternal Grandmother   . Diabetes Paternal Grandfather   . Anesthesia problems Neg Hx   . Hypotension Neg Hx   . Malignant hyperthermia Neg Hx   . Pseudochol deficiency Neg Hx     Prior to Admission medications   Medication Sig Start Date End Date Taking? Authorizing Provider  atorvastatin (LIPITOR) 80 MG tablet Take 1 tablet (80 mg total) by mouth daily at 6 PM. 11/15/18  Yes Bhagat, Bhavinkumar, PA  carvedilol (COREG) 6.25 MG tablet Take 1.5 tablets (9.375 mg total) by mouth 2 (two) times daily. 01/25/19  Yes Richardson Dopp T, PA-C  Exenatide ER (BYDUREON) 2 MG SRER Inject 2 mg into the skin every Thursday.    Yes [provider]  fluconazole (DIFLUCAN) 150 MG tablet Take 150 mg by mouth once.  05/02/19  Yes [provider]  furosemide (LASIX) 40 MG tablet Take 1 tablet (40 mg total) by mouth 2 (two) times daily. 03/17/19 06/15/19 Yes Weaver, Scott T, PA-C  gabapentin (NEURONTIN) 300 MG capsule Take 300 mg by mouth 3 (three) times daily as needed (for neuropathy pain).    Yes [provider]  GNP ASPIRIN LOW DOSE 81 MG EC tablet TAKE 1 TABLET BY MOUTH ONCE DAILY. Patient taking differently: Take 81 mg by mouth daily.  03/24/19  Yes Weaver, Scott T, PA-C  LANTUS SOLOSTAR 100 UNIT/ML Solostar Pen INNJECT 50 UNITS S.Q. ONCE DAILY AT 10 P.M. Patient taking differently: Inject 50 Units into the skin at bedtime.  02/03/16  Yes Nida, Marella Chimes, MD  losartan (COZAAR) 25 MG tablet TAKE 1 TABLET BY MOUTH ONCE DAILY. Patient taking differently: Take 25 mg by mouth every evening.  03/24/19  Yes Weaver, Scott T, PA-C  metFORMIN (GLUCOPHAGE) 500 MG tablet TAKE 1 TABLET BY MOUTH TWICE DAILY WITH MEALS. Patient taking differently: Take 500 mg by mouth 2 (two) times daily with a meal.  02/03/16  Yes Nida, Marella Chimes, MD   metoCLOPramide (REGLAN) 10 MG/10ML SOLN Take 10 mg by mouth 3 (three) times daily.    Yes [provider]  Multiple Vitamin (MULTIVITAMIN WITH MINERALS) TABS tablet Take 1 tablet by mouth daily. 07/04/18  Yes Isaac Bliss, Rayford Halsted, MD  nitroGLYCERIN (NITROSTAT) 0.4 MG SL tablet Place 1 tablet (0.4 mg total) under the tongue every 5 (five) minutes as needed. 11/15/18  Yes Bhagat, Bhavinkumar, PA  NOVOLOG FLEXPEN 100 UNIT/ML FlexPen INJECT 12-18 UNITS S.Q. THREE TIMES DAILY WITH MEALS. Patient taking differently: Inject 12-18 Units into the skin 3 (three) times daily. Per MD sliding scale for blood  sugars over 150 04/29/16  Yes Nida, Marella Chimes, MD  ondansetron (ZOFRAN ODT) 4 MG disintegrating tablet Take 1 tablet (4 mg total) by mouth every 8 (eight) hours as needed for nausea or vomiting. 07/03/18  Yes Isaac Bliss, Rayford Halsted, MD  potassium chloride (K-DUR) 10 MEQ tablet Take 1 tablet (10 mEq total) by mouth 2 (two) times daily. 03/17/19 06/15/19 Yes Weaver, Scott T, PA-C  sertraline (ZOLOFT) 100 MG tablet Take 200 mg by mouth at bedtime.    Yes [provider]  spironolactone (ALDACTONE) 25 MG tablet Take 0.5 tablets (12.5 mg total) by mouth daily. 11/15/18  Yes Bhagat, Bhavinkumar, PA  sulfamethoxazole-trimethoprim (BACTRIM DS,SEPTRA DS) 800-160 MG tablet Take 1 tablet by mouth 2 (two) times daily.  03/01/19  Yes [provider]  ticagrelor ([name]) 90 MG TABS tablet Take 1 tablet (90 mg total) by mouth 2 (two) times daily. 12/15/18  Yes Leanor Kail, PA    Physical Exam: Vitals:   05/03/19 1532 05/03/19 1600 05/03/19 1630 05/03/19 1700  BP: (!) 150/94 121/85 130/84 135/87  Pulse: 90 84 79 83  Resp: 14 15 (!) 23 17  Temp:      TempSrc:      SpO2: 100% 99% 95% 100%  Weight:      Height:        Constitutional: Pain but easily arousable, calm, comfortable Vitals:   05/03/19 1532 05/03/19 1600 05/03/19 1630 05/03/19 1700  BP: (!) 150/94 121/85  130/84 135/87  Pulse: 90 84 79 83  Resp: 14 15 (!) 23 17  Temp:      TempSrc:      SpO2: 100% 99% 95% 100%  Weight:      Height:       Eyes: PERRL, lids and conjunctivae normal ENMT: Mucous membranes are moist. Posterior pharynx clear of any exudate or lesions.  Neck: normal, supple, no masses, no thyromegaly Respiratory: clear to auscultation bilaterally, no wheezing, no crackles. Normal respiratory effort. No accessory muscle use.  Cardiovascular: Regular rate and rhythm, no murmurs / rubs / gallops. No extremity edema. 2+ pedal pulses.  Abdomen: no tenderness, no masses palpated. No hepatosplenomegaly. Bowel sounds positive.  Musculoskeletal: no clubbing / cyanosis. Right Ray amputation. Draining longitudinal wound ~ 6cm, with foul smell, unhealthy appearing granulation tissue to lateral side of left foot . Good ROM, no contractures. Normal muscle tone.  Skin: no rashes, lesions, ulcers. No induration Neurologic: CN 2-12 grossly intact.  Strength 5/5 in all 4.  Psychiatric: Normal judgment and insight. Alert and oriented x 3. Normal mood.   Labs on Admission: I have personally reviewed following labs and imaging studies  CBC: Recent Labs  Lab 05/03/19 1311  WBC 9.4  HGB 12.8  HCT 37.2  MCV 90.3  PLT 716   Basic Metabolic Panel: Recent Labs  Lab 05/03/19 1311  NA 137  K 3.9  CL 96*  CO2 29  GLUCOSE 200*  BUN 24*  CREATININE 1.60*  CALCIUM 9.3   Cardiac Enzymes: Recent Labs  Lab 05/03/19 1311  TROPONINI <0.03   BNP (last 3 results) Recent Labs    11/29/18 1003 12/21/18 1548  PROBNP 3,367* 2,669*   CBG: Recent Labs  Lab 05/03/19 1239  GLUCAP 213*    Radiological Exams on Admission: Dg Chest 2 View  Result Date: 05/03/2019 CLINICAL DATA:  Chest pain since last night, crushing chest pain relieved by 2 nitroglycerin tablets, history coronary artery disease post MI, chronic systolic CHF, type I diabetes mellitus,  hypertension EXAM: CHEST - 2 VIEW  COMPARISON:  01/09/2019 FINDINGS: Normal heart size, mediastinal contours, and pulmonary vascularity. Lungs clear. No infiltrate, pleural effusion or pneumothorax. Bones unremarkable. IMPRESSION: No acute abnormalities. Electronically Signed   By: Lavonia Dana M.D.   On: 05/03/2019 12:50    EKG: Independently reviewed.  Sinus rhythm-rate 87.  No significant ST or T wave abnormalities when compared to prior EKG.  QTc 452.  Assessment/Plan Active Problems:   Chest pain   Chest pain- typical, concerning for ACS with Recent STEMI.  Unfortunately, questionable compliance with [name] over the past ~ 1 week. EDP talked to Dr. Conni Elliot, will see in a.m. -Continue aspirin and [name] -Trend troponin -EKG a.m. -Cardiology consult -PRN nitro and IV morphine 2 mg -Continue statins, Coreg, Losartan, Spironolactone - NPO midinight  CAD- STEMI 10/2018 with revascularization.  Cardiac cath showed 99 patient stenosis of mid LAD, PTCA performed by Dr. Irish Lack.  Also has chronic occlusions of marginal vessels with right to left collaterals. -Continue statins ACE-inh, beta-blockers, antiplatelet  Systolic CHF- stable.  Recent echo 01/2019 shows EF 30 to 35%, fused hypokinesis, with impaired ventricular relaxation. -Cont home Lasix 40 twice daily, Spironolactone potassium chloride supplements  CKD 3- 1.6.  Creatinine 1.6.  1.7. - Cont Lasix  Chronic Diabetic foot wound- draining. Currently on Bactrim. Follows with wound care clinic. Reports she was supposed to have a wound Vac placed. - Cont home bactrim. - Wound Care consult.  Hypertension- Stable. -Coreg home Coreg, losartan, spironolactone  Diabetes mellitus, Gastroparesis-glucose 200.  Home medications Lantus 50 nightly and sliding scale NovoLog 12 to 18 units with metformin. - SSI -Continue home Lantus at reduced dose 20 units nightly, hold pre-meal insulin and metformin - Cont home metoclopramide PRN  DVT prophylaxis: Lovenox Code  Status: Full Family Communication: None at bedside Disposition Plan: Per rounding team Consults called: Cardiology Admission status: Obs, Tele  Bethena Roys MD [facility]  05/03/2019, 5:59 PM

## 2019-05-03 NOTE — Telephone Encounter (Signed)
Pt reports CP last night.  Took 1 nitro around MN, pain eased off and she fell asleep. Woke up around 3 am feeling nauseous, vomited and took another nitro for CP.  Confirms pain feels similar to previous MI. She has not taken anymore nitro.  Reports HA, "SOB and sweaty", pulse 111. Some neck pain currently. Pt advised to go to ED for evaluation.  Someone will drive her.  Pt agreeable to plan.

## 2019-05-03 NOTE — ED Provider Notes (Signed)
[facility] EMERGENCY DEPARTMENT Provider Note   CSN: [id number] Arrival date & time: 05/03/19  1224    History   Chief Complaint Chief Complaint  Patient presents with  . Chest Pain    HPI Anne [name] is a 38 y.o. female.     Patient complains of chest discomfort last night that she needed to nitro.  She has a history of MI in December.  And she had some chest pain today  The history is provided by the patient. No language interpreter was used.  Chest Pain  Pain location:  Substernal area Pain quality: aching   Pain radiates to:  Does not radiate Pain severity:  Moderate Onset quality:  Sudden Timing:  Constant Progression:  Unchanged Chronicity:  New Context: not breathing   Associated symptoms: no abdominal pain, no back pain, no cough, no fatigue and no headache     Past Medical History:  Diagnosis Date  . Acid reflux   . Anxiety   . Arthritis   . 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 CAD (coronary artery disease) 11/13/2018   Late presentation anterior MI 12/19 >> LHC - dLM 25, mLAD 99, oOM2 100 (R-L collats), irreg RCA, EF 25-35 >> PCI: POBA to mLAD  . Chronic systolic CHF (congestive heart failure) ([place]) 11/28/2018   Ischemic CM // late presentation ant MI 10/2018 tx with POBA to LAD (residual CAD with CTO of the OM2) // Echo 12/19:  No mural apical thrombus, septal, apical mid ant and inf HK; mild LVH, EF 30-35, mild MR // Echo 01/2019: EF 30-35, Gr 1 DD, diff HK, apical AK, mild MR   . CKD (chronic kidney disease), stage I   . Diabetes mellitus type 1 ([place])   . Former tobacco use   . Gastroparesis   . Hypercholesteremia   . Hypertension   . Osteomyelitis ([place])    a. s/p R forefoot amputation.  . Renal failure   . Sciatica     Patient Active Problem List   Diagnosis Date Noted  . Chest pain 05/03/2019  . Chronic systolic CHF (congestive heart failure) ([place]) 11/28/2018  . S/P PTCA (percutaneous transluminal  coronary angioplasty)   . CAD (coronary artery disease) 11/13/2018  . 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[place])  11/13/2018  . 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 11/11/2018  . Osteomyelitis ([place]) 07/01/2018  . Personal history of noncompliance with medical treatment, presenting hazards to health 10/28/2015  . Multiple nevi 10/07/2012  . Toe ulcer ([place]) 03/11/2012  . Essential hypertension 03/11/2012  . Type 2 diabetes mellitus with vascular disease ([place]) 02/11/2012  . Diabetic neuropathy ([employer]) 02/11/2012  . Back pain 02/11/2012  . Muscle spasm 02/11/2012  . Hyperlipidemia associated with type 2 diabetes mellitus ([place]) 02/11/2012  . Obesity 02/11/2012  . Cellulitis in diabetic foot ([place]) 10/31/2011    Past Surgical History:  Procedure Laterality Date  . AMPUTATION Right 11/03/2011   Procedure: AMPUTATION RAY;  Surgeon: Jamesetta So;  Location: [facility];  Service: General;  Laterality: Right;  Right fourth and fifth metatarsal   . APPENDECTOMY    . CESAREAN SECTION  2004 and 2007   x2  . CORONARY/GRAFT ACUTE MI REVASCULARIZATION N/A 11/11/2018   Procedure: CORONARY/GRAFT ACUTE MI REVASCULARIZATION;  Surgeon: Jettie Booze, MD;  Location: [place] [facility];  Service: Cardiovascular;  Laterality: N/A;  . LEFT  HEART CATH AND CORONARY ANGIOGRAPHY N/A 11/11/2018   Procedure: LEFT HEART CATH AND CORONARY ANGIOGRAPHY;  Surgeon: Jettie Booze, MD;  Location: [place] [facility];  Service: Cardiovascular;  Laterality: N/A;  . TUBAL LIGATION       OB History   No obstetric history on file.      Home Medications    Prior to Admission medications   Medication Sig Start Date End Date Taking? Authorizing Provider  atorvastatin (LIPITOR) 80 MG tablet Take 1 tablet (80 mg total) by mouth daily at 6 PM. 11/15/18  Yes Bhagat, Bhavinkumar, PA  carvedilol (COREG) 6.25 MG tablet Take 1.5 tablets (9.375 mg total) by mouth 2  (two) times daily. 01/25/19  Yes Richardson Dopp T, PA-C  Exenatide ER (BYDUREON) 2 MG SRER Inject 2 mg into the skin every Thursday.    Yes [provider]  fluconazole (DIFLUCAN) 150 MG tablet Take 150 mg by mouth once.  05/02/19  Yes [provider]  furosemide (LASIX) 40 MG tablet Take 1 tablet (40 mg total) by mouth 2 (two) times daily. 03/17/19 06/15/19 Yes Weaver, Scott T, PA-C  gabapentin (NEURONTIN) 300 MG capsule Take 300 mg by mouth 3 (three) times daily as needed (for neuropathy pain).    Yes [provider]  GNP ASPIRIN LOW DOSE 81 MG EC tablet TAKE 1 TABLET BY MOUTH ONCE DAILY. Patient taking differently: Take 81 mg by mouth daily.  03/24/19  Yes Weaver, Scott T, PA-C  LANTUS SOLOSTAR 100 UNIT/ML Solostar Pen INNJECT 50 UNITS S.Q. ONCE DAILY AT 10 P.M. Patient taking differently: Inject 50 Units into the skin at bedtime.  02/03/16  Yes Nida, Marella Chimes, MD  losartan (COZAAR) 25 MG tablet TAKE 1 TABLET BY MOUTH ONCE DAILY. Patient taking differently: Take 25 mg by mouth every evening.  03/24/19  Yes Weaver, Scott T, PA-C  metFORMIN (GLUCOPHAGE) 500 MG tablet TAKE 1 TABLET BY MOUTH TWICE DAILY WITH MEALS. Patient taking differently: Take 500 mg by mouth 2 (two) times daily with a meal.  02/03/16  Yes Nida, Marella Chimes, MD  metoCLOPramide (REGLAN) 10 MG/10ML SOLN Take 10 mg by mouth 3 (three) times daily.    Yes [provider]  Multiple Vitamin (MULTIVITAMIN WITH MINERALS) TABS tablet Take 1 tablet by mouth daily. 07/04/18  Yes Isaac Bliss, Rayford Halsted, MD  nitroGLYCERIN (NITROSTAT) 0.4 MG SL tablet Place 1 tablet (0.4 mg total) under the tongue every 5 (five) minutes as needed. 11/15/18  Yes Bhagat, Bhavinkumar, PA  NOVOLOG FLEXPEN 100 UNIT/ML FlexPen INJECT 12-18 UNITS S.Q. THREE TIMES DAILY WITH MEALS. Patient taking differently: Inject 12-18 Units into the skin 3 (three) times daily. Per MD sliding scale for blood sugars over 150 04/29/16  Yes Nida,  Marella Chimes, MD  ondansetron (ZOFRAN ODT) 4 MG disintegrating tablet Take 1 tablet (4 mg total) by mouth every 8 (eight) hours as needed for nausea or vomiting. 07/03/18  Yes Isaac Bliss, Rayford Halsted, MD  potassium chloride (K-DUR) 10 MEQ tablet Take 1 tablet (10 mEq total) by mouth 2 (two) times daily. 03/17/19 06/15/19 Yes Weaver, Scott T, PA-C  sertraline (ZOLOFT) 100 MG tablet Take 200 mg by mouth at bedtime.    Yes [provider]  spironolactone (ALDACTONE) 25 MG tablet Take 0.5 tablets (12.5 mg total) by mouth daily. 11/15/18  Yes Bhagat, Bhavinkumar, PA  sulfamethoxazole-trimethoprim (BACTRIM DS,SEPTRA DS) 800-160 MG tablet Take 1 tablet by mouth 2 (two) times daily.  03/01/19  Yes [provider]  ticagrelor ([name]) 90 MG TABS tablet Take 1 tablet (90 mg total) by mouth 2 (two) times daily. 12/15/18  Yes Bhagat, Crista Luria, PA    Family History Family History  Problem Relation Age of Onset  . Diabetes Father   . Lung cancer Father   . Alcoholism Father   . Asthma Mother   . Kidney disease Mother   . Anemia Mother        hemolytic  . Hypertension Mother   . Heart attack Paternal Grandmother   . Diabetes Paternal Grandmother   . Diabetes Paternal Grandfather   . Anesthesia problems Neg Hx   . Hypotension Neg Hx   . Malignant hyperthermia Neg Hx   . Pseudochol deficiency Neg Hx     Social History Social History   Tobacco Use  . Smoking status: Former Smoker    Packs/day: 0.25    Years: 20.00    Pack years: 5.00    Types: Cigarettes    Last attempt to quit: 12/29/2013    Years since quitting: 5.3  . Smokeless tobacco: Never Used  Substance Use Topics  . Alcohol use: No  . Drug use: No     Allergies   Canagliflozin and Nsaids   Review of Systems Review of Systems  Constitutional: Negative for appetite change and fatigue.  HENT: Negative for congestion, ear discharge and sinus pressure.   Eyes: Negative for discharge.  Respiratory:  Negative for cough.   Cardiovascular: Positive for chest pain.  Gastrointestinal: Negative for abdominal pain and diarrhea.  Genitourinary: Negative for frequency and hematuria.  Musculoskeletal: Negative for back pain.  Skin: Negative for rash.  Neurological: Negative for seizures and headaches.  Psychiatric/Behavioral: Negative for hallucinations.     Physical Exam Updated Vital Signs BP 130/84   Pulse 79   Temp 98.1 F (36.7 C) (Oral)   Resp (!) 23   Ht 5\' 10"  (1.778 m)   Wt 85.7 kg   LMP 05/03/2019 Comment: tubal ligation  SpO2 95%   BMI 27.12 kg/m   Physical Exam Vitals signs and nursing note reviewed.  Constitutional:      Appearance: She is well-developed.  HENT:     Head: Normocephalic.     Nose: Nose normal.  Eyes:     General: No scleral icterus.    Conjunctiva/sclera: Conjunctivae normal.  Neck:     Musculoskeletal: Neck supple.     Thyroid: No thyromegaly.  Cardiovascular:     Rate and Rhythm: Normal rate and regular rhythm.     Heart sounds: No murmur. No friction rub. No gallop.   Pulmonary:     Breath sounds: No stridor. No wheezing or rales.  Chest:     Chest wall: No tenderness.  Abdominal:     General: There is no distension.     Tenderness: There is no abdominal tenderness. There is no rebound.  Musculoskeletal: Normal range of motion.  Lymphadenopathy:     Cervical: No cervical adenopathy.  Skin:    Findings: No erythema or rash.  Neurological:     Mental Status: She is alert and oriented to person, place, and time.     Motor: No abnormal muscle tone.     Coordination: Coordination normal.  Psychiatric:        Behavior: Behavior normal.      ED Treatments / Results  Labs (all labs ordered are listed, but only abnormal results are displayed) Labs Reviewed  BASIC METABOLIC PANEL - Abnormal; Notable for the following components:  Result Value   Chloride 96 (*)    Glucose, Bld 200 (*)    BUN 24 (*)    Creatinine, Ser 1.60 (*)     GFR calc non Af Amer 40 (*)    GFR calc Af Amer 47 (*)    All other components within normal limits  GLUCOSE, CAPILLARY - Abnormal; Notable for the following components:   Glucose-Capillary 213 (*)    All other components within normal limits  NOVEL CORONAVIRUS, NAA (HOSPITAL ORDER, SEND-OUT TO REF LAB)  CBC  TROPONIN I  TROPONIN I  TROPONIN I    EKG None  Radiology Dg Chest 2 View  Result Date: 05/03/2019 CLINICAL DATA:  Chest pain since last night, crushing chest pain relieved by 2 nitroglycerin tablets, history coronary artery disease post MI, chronic systolic CHF, type I diabetes mellitus, hypertension EXAM: CHEST - 2 VIEW COMPARISON:  01/09/2019 FINDINGS: Normal heart size, mediastinal contours, and pulmonary vascularity. Lungs clear. No infiltrate, pleural effusion or pneumothorax. Bones unremarkable. IMPRESSION: No acute abnormalities. Electronically Signed   By: Lavonia Dana M.D.   On: 05/03/2019 12:50    Procedures Procedures (including critical care time)  Medications Ordered in ED Medications  nitroGLYCERIN (NITROSTAT) SL tablet 0.4 mg (0.4 mg Sublingual Given 05/03/19 1535)  sodium chloride flush (NS) 0.9 % injection 3 mL (3 mLs Intravenous Given 05/03/19 1537)  sodium chloride 0.9 % bolus 500 mL (500 mLs Intravenous New Bag/Given 05/03/19 1552)     Initial Impression / Assessment and Plan / ED Course  I have reviewed the triage vital signs and the nursing notes.  Pertinent labs & imaging results that were available during my care of the patient were reviewed by me and considered in my medical decision making (see chart for details).    Patient with chest discomfort and history of coronary artery disease.  Labs unremarkable.  I spoke with cardiology and it was recommended by cardiology to admit the patient and cycle her enzymes and the cardiologist will consult tomorrow      Final Clinical Impressions(s) / ED Diagnoses   Final diagnoses:  Unstable angina  pectoris [facility])    ED Discharge Orders    None       Milton Ferguson, MD 05/03/19 1651

## 2019-05-04 ENCOUNTER — Other Ambulatory Visit: Payer: Self-pay

## 2019-05-04 DIAGNOSIS — K3184 Gastroparesis: Secondary | ICD-10-CM | POA: Diagnosis not present

## 2019-05-04 DIAGNOSIS — I255 Ischemic cardiomyopathy: Secondary | ICD-10-CM

## 2019-05-04 DIAGNOSIS — I25119 Atherosclerotic heart disease of native coronary artery with unspecified angina pectoris: Secondary | ICD-10-CM

## 2019-05-04 DIAGNOSIS — R079 Chest pain, unspecified: Secondary | ICD-10-CM | POA: Diagnosis not present

## 2019-05-04 LAB — GLUCOSE, CAPILLARY
Glucose-Capillary: 199 mg/dL — ABNORMAL HIGH (ref 70–99)
Glucose-Capillary: 230 mg/dL — ABNORMAL HIGH (ref 70–99)
Glucose-Capillary: 174 mg/dL — ABNORMAL HIGH (ref 70–99)

## 2019-05-04 LAB — NOVEL CORONAVIRUS, NAA (HOSP ORDER, SEND-OUT TO REF LAB; TAT 18-24 HRS): SARS-CoV-2, NAA: NOT DETECTED

## 2019-05-04 LAB — TROPONIN I: Troponin I: <0.03 ng/mL (ref <0.03)

## 2019-05-04 MED ORDER — LOSARTAN POTASSIUM 25 MG PO TABS: 25.0000 mg | ORAL_TABLET | Freq: Every evening | ORAL | 5 refills | Status: AC

## 2019-05-04 MED ORDER — FUROSEMIDE 40 MG PO TABS: 40.0000 mg | ORAL_TABLET | Freq: Two times a day (BID) | ORAL | 1 refills | Status: DC

## 2019-05-04 MED ORDER — ASPIRIN 81 MG PO TBEC: 81.0000 mg | DELAYED_RELEASE_TABLET | Freq: Every day | ORAL | 3 refills | Status: AC

## 2019-05-04 MED ORDER — COLLAGENASE 250 UNIT/GM EX OINT
TOPICAL_OINTMENT | Freq: Every day | CUTANEOUS | Status: DC
  Administered 2019-05-04: 14:00:00 via TOPICAL
  Filled 2019-05-04: qty 30

## 2019-05-04 MED ORDER — POTASSIUM CHLORIDE ER 10 MEQ PO TBCR: 10.0000 meq | EXTENDED_RELEASE_TABLET | Freq: Two times a day (BID) | ORAL | 1 refills | Status: DC

## 2019-05-04 MED ORDER — SPIRONOLACTONE 25 MG PO TABS: 12.5000 mg | ORAL_TABLET | Freq: Every day | ORAL | 6 refills | Status: DC

## 2019-05-04 MED ORDER — NITROGLYCERIN 0.4 MG SL SUBL: 0.4000 mg | SUBLINGUAL_TABLET | SUBLINGUAL | 12 refills | Status: DC | PRN

## 2019-05-04 MED ORDER — ATORVASTATIN CALCIUM 80 MG PO TABS: 80.0000 mg | ORAL_TABLET | Freq: Every evening | ORAL | 6 refills | Status: AC

## 2019-05-04 MED ORDER — CARVEDILOL 6.25 MG PO TABS: 9.3750 mg | ORAL_TABLET | Freq: Two times a day (BID) | ORAL | 6 refills | Status: DC

## 2019-05-04 MED ORDER — METOCLOPRAMIDE HCL 10 MG/10ML PO SOLN: 10.0000 mg | Freq: Three times a day (TID) | ORAL | 2 refills | Status: DC

## 2019-05-04 MED ORDER — DOXYCYCLINE HYCLATE 100 MG PO TABS: 100.0000 mg | ORAL_TABLET | Freq: Two times a day (BID) | ORAL | 0 refills | Status: AC

## 2019-05-04 MED ORDER — TICAGRELOR 90 MG PO TABS: 90.0000 mg | ORAL_TABLET | Freq: Two times a day (BID) | ORAL | 11 refills | Status: DC

## 2019-05-04 MED ORDER — ONDANSETRON 4 MG PO TBDP: 4.0000 mg | ORAL_TABLET | Freq: Three times a day (TID) | ORAL | 2 refills | Status: DC | PRN

## 2019-05-04 MED ORDER — COLLAGENASE 250 UNIT/GM EX OINT: TOPICAL_OINTMENT | Freq: Every day | CUTANEOUS | 0 refills | Status: DC

## 2019-05-04 MED ORDER — PROMETHAZINE HCL 12.5 MG PO TABS: 12.5000 mg | ORAL_TABLET | Freq: Four times a day (QID) | ORAL | 0 refills | Status: DC | PRN

## 2019-05-04 MED ORDER — ACETAMINOPHEN 325 MG PO TABS: 650.0000 mg | ORAL_TABLET | Freq: Four times a day (QID) | ORAL | 0 refills | Status: DC | PRN

## 2019-05-04 NOTE — Consult Note (Addendum)
[place] Nurse wound consult note Reason for Consult:Nonhealing chronic wound to left lateral foot.  Draining and purulent with foul odor.  Patient indicates she is supposed to have a wound VAC in place but the presence of devitalized tissue and purulence contraindicates use of NPWT.  History previous amputation right fourth and fifth metatarsals.   Wound type: Neuropathic Pressure Injury POA: NA Measurement: 0.2 cm x 6 cm unable to view depth to left lateral foot.  Left fourth toe, purulence between toe.  Wound QSY:[id number] tissue  Drainage (amount, consistency, odor) moderate purulence Periwound:edema and erythema Dressing procedure/placement/frequency: Cleanse left lateral foot/toe wound with NS and pat dry.  Apply Santyl to wound bed.  Cover with NS moist gauze.  Secure with gauze and kerlix/tape.  Change daily.  Will not follow at this time.  Please re-consult if needed.  Domenic Moras MSN, RN, FNP-BC CWON Wound, Ostomy, Continence Nurse Pager [phone]

## 2019-05-04 NOTE — Discharge Summary (Signed)
TANIKA BRACCO, is a 38 y.o. female  DOB 1980-12-20  MRN [id number].  Admission date:  05/03/2019  Admitting Physician  Bethena Roys, MD  Discharge Date:  05/04/2019   Primary MD  Zhou-Talbert, Elwyn Lade, MD  Recommendations for primary care physician for things to follow:  1) left foot wound care ----cleanse left lateral foot/toe wound with NS and pat dry.  Apply Santyl to wound bed.  Cover with NS moist gauze.  Secure with gauze and kerlix/tape.  Change daily... Follow-up with wound clinic for recheck and further management 2) follow-up with your gastroenterologist Dr. Havery Moros for adjustment of your gastro-intestinal medications to help with your gastroparesis and nausea 3) follow-up with your cardiologist and Dr. Crissie Sickles to further discuss management of you coronary artery disease and ischemic cardiomyopathy and decide on the timing of your defibrillator placement 4)Low salt, consistent carbohydrate and low-fat diet advised 5) you are taking [name] and aspirin which are blood thinners so Avoid ibuprofen/Advil/Aleve/Motrin/Goody Powders/Naproxen/BC powders/Meloxicam/Diclofenac/Indomethacin and other Nonsteroidal anti-inflammatory medications as these will make you more likely to bleed and can cause stomach ulcers, can also cause Kidney problems.  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)    CAD (coronary artery disease) 11/13/2018   Late presentation anterior MI 12/19 >> LHC - dLM 25, mLAD 99, oOM2 100 (R-L collats), irreg RCA, EF 25-35 >> PCI: POBA to mLAD   Chronic systolic CHF (congestive heart failure) ([place]) 11/28/2018     Ischemic CM // late presentation ant MI 10/2018 tx with POBA to LAD (residual CAD with CTO of the OM2) // Echo 12/19:  No mural apical thrombus, septal, apical mid ant and inf HK; mild LVH, EF 30-35, mild MR // Echo 01/2019: EF 30-35, Gr 1 DD, diff HK, apical AK, mild MR    CKD (chronic kidney disease), stage I    Diabetes mellitus type 1 ([place])    Former tobacco use    Gastroparesis    Hypercholesteremia    Hypertension    Osteomyelitis ([place])    a. s/p R forefoot amputation.   Renal failure    Sciatica     Past Surgical History:  Procedure Laterality Date   AMPUTATION Right 11/03/2011   Procedure: AMPUTATION RAY;  Surgeon: Jamesetta So;  Location: [facility];  Service: General;  Laterality: Right;  Right fourth and fifth metatarsal    APPENDECTOMY     CESAREAN SECTION  2004 and 2007   x2   CORONARY/GRAFT ACUTE MI REVASCULARIZATION N/A 11/11/2018   Procedure: CORONARY/GRAFT ACUTE MI REVASCULARIZATION;  Surgeon: Jettie Booze, MD;  Location: [facility];  Service: Cardiovascular;  Laterality: N/A;   LEFT HEART CATH AND  CORONARY ANGIOGRAPHY N/A 11/11/2018   Procedure: LEFT HEART CATH AND CORONARY ANGIOGRAPHY;  Surgeon: Jettie Booze, MD;  Location: [facility];  Service: Cardiovascular;  Laterality: N/A;   TUBAL LIGATION         HPI  from the history and physical done on the day of admission:    Chief Complaint: Chest Discomfort  HPI: DAMICA GRAVLIN is a 38 y.o. female with medical history significant for recent ST elevation MI 10/2018, [place] mellitus, hypertension, systolic CHF, who presented to the ED with complaints of chest pain that started last night.  First episode started when she was sitting down, and then she had a second one from sleep.  She vomited after she had chest pain the second time.  She describes it more as a chest discomforts, and radiating, that is described as pressure-like " Like someone sitting on her chest" . And  is associated with difficulty breathing.  Symptoms are also provoked by exertion and feel similar to when she had a MI 11/11/2018.  Pain resolved at home and here in the ED with nitroglycerin. One week ago, Patient's daughter mistakenly displaced patient's pills, and try to put them back in the pillbox, but it was started to which p.o. went where. Because of this, though patient has been taking her medications, she is unsure if she has actually been taking her [name] twice a day.   ED Course: Stable vitals.  Troponin less than 0.03.  Chest x-ray clear.  EKG sinus rhythm, no ST or T wave abnormalities.  Creatinine consistent with baseline 1.6.  Unremarkable CBC.  Improvement in chest pain.  500 mill bolus normal saline given. EDP to Dr. Conni Elliot who recommended admission here, to see in a.m.    Hospital Course:      Brief Summary 38 y.o. female with past medical history of CAD (s/p late presenting MI in 10/2018 with cath showing 100% 2nd Mrg occlusion with R--> L collaterals, 25% distal LM, and 99% mid-LAD treated with POBA as vessel felt to be too small for stenting or CABG), chronic systolic CHF (reduced EF of 30-35%), Type 1 DM, HTN, HLD, Type 2 DM with gastroparesis and Osteomyelitis (s/p prior amputation of right fourth and fifth metatarsals)  A/p 1)Chest pain-  Ruled out for ACS by cardiac enzymes and EKG, h/o  Recent STEMI (10/2018)-LHC at that time showed occlusive CAD, patient is status post  Revascularization/PTCA at that time,   Patient is chest pain-free, symptoms may be GI related.   Cardiology consult appreciated medical management advised-Continue aspirin and [name], -Continue atorvastatin, Coreg, Losartan, Spironolactone and Lasix   2)HFrEF/ischemic cardiomyopathy /systolic CHF- stable.  Recent echo 01/2019 shows EF 30 to 35%, f with impaired ventricular relaxation. -Cont home Lasix 40 twice daily, Spironolactone potassium chloride supplements.  Follow-up with Dr. Crissie Sickles  to discuss AICD placement  3)Lt Foot/4th Toe Wound-neuropathic wound in a diabetic patient left Foot wound care ----cleanse left lateral foot/toe wound with NS and pat dry.  Apply Santyl to wound bed.  Cover with NS moist gauze.  Secure with gauze and kerlix/tape.  Change daily... Follow-up with wound clinic for recheck and further management.  Stop Bactrim as prescribed previously, use doxycycline instead due to concerns about renal function and potassium closely  4)CKD 3- 1.6.  Creatinine 1.6.  1.7. - Cont Lasix.... Avoid Bactrim, avoid NSAIDs  5)Hypertension- Stable. -Coreg home Coreg, losartan, spironolactone  6)Diabetes mellitus, Gastroparesis-recent A1c 7.0 reflecting good diabetic control,  resume home  regimen-Continue home Lantus  - Cont home metoclopramide  and other antiemetics as advised, follow-up with  gastroenterologist Dr. Havery Moros for adjustment of your gastro-intestinal medications to help with your gastroparesis and nausea   Discharge Condition: stable  Follow UP--- with gastroenterologist Dr. Enis Gash, also follow-up with wound clinic, and follow-up with Dr. Crissie Sickles cardiology/EP     Consults obtained -cardiology and wound care specialist  Diet and Activity recommendation:  As advised  Discharge Instructions    Discharge Instructions    Call MD for:  difficulty breathing, headache or visual disturbances   Complete by: As directed    Call MD for:  persistant dizziness or light-headedness   Complete by: As directed    Call MD for:  persistant nausea and vomiting   Complete by: As directed    Call MD for:  redness, tenderness, or signs of infection (pain, swelling, redness, odor or green/yellow discharge around incision site)   Complete by: As directed    Call MD for:  temperature >100.4   Complete by: As directed    Diet - low sodium heart healthy   Complete by: As directed    Diet Carb Modified   Complete by: As directed    Discharge instructions    Complete by: As directed    1) left foot wound care ----cleanse left lateral foot/toe wound with NS and pat dry.  Apply Santyl to wound bed.  Cover with NS moist gauze.  Secure with gauze and kerlix/tape.  Change daily... Follow-up with wound clinic for recheck and further management 2) follow-up with your gastroenterologist Dr. Havery Moros for adjustment of your gastro-intestinal medications to help with your gastroparesis and nausea 3) follow-up with your cardiologist and Dr. Crissie Sickles to further discuss management of you coronary artery disease and ischemic cardiomyopathy and decide on the timing of your defibrillator placement 4)Low salt, consistent carbohydrate and low-fat diet advised 5) you are taking [name] and aspirin which are blood thinners so Avoid ibuprofen/Advil/Aleve/Motrin/Goody Powders/Naproxen/BC powders/Meloxicam/Diclofenac/Indomethacin and other Nonsteroidal anti-inflammatory medications as these will make you more likely to bleed and can cause stomach ulcers, can also cause Kidney problems.   Increase activity slowly   Complete by: As directed         Discharge Medications     Allergies as of 05/04/2019      Reactions   Canagliflozin Other (See Comments)   Vaginal yeast infections   Nsaids Other (See Comments)   Yeast infection      Medication List    STOP taking these medications   sulfamethoxazole-trimethoprim 800-160 MG tablet Commonly known as: BACTRIM DS     TAKE these medications   acetaminophen 325 MG tablet Commonly known as: TYLENOL Take 2 tablets (650 mg total) by mouth every 6 (six) hours as needed for mild pain (or Fever >/= 101).   aspirin 81 MG EC tablet Commonly known as: GNP Aspirin Low Dose Take 1 tablet (81 mg total) by mouth daily with breakfast. Swallow whole. What changed:   how much to take  when to take this  additional instructions   atorvastatin 80 MG tablet Commonly known as: LIPITOR Take 1 tablet (80 mg total) by mouth  every evening. What changed: when to take this   Bydureon 2 MG Srer Generic drug: Exenatide ER Inject 2 mg into the skin every Thursday.   carvedilol 6.25 MG tablet Commonly known as: COREG Take 1.5 tablets (9.375 mg total) by mouth 2 (two) times daily.   collagenase ointment Commonly  known as: SANTYL Apply topically daily. Cleanse left lateral foot/toe wound with NS and pat dry.  Apply Santyl to wound bed.  Cover with NS moist gauze.  Secure with gauze and kerlix/tape.  Change daily   doxycycline 100 MG tablet Commonly known as: VIBRA-TABS Take 1 tablet (100 mg total) by mouth 2 (two) times daily for 7 days.   fluconazole 150 MG tablet Commonly known as: DIFLUCAN Take 150 mg by mouth once.   furosemide 40 MG tablet Commonly known as: LASIX Take 1 tablet (40 mg total) by mouth 2 (two) times daily.   gabapentin 300 MG capsule Commonly known as: NEURONTIN Take 300 mg by mouth 3 (three) times daily as needed (for neuropathy pain).   Lantus SoloStar 100 UNIT/ML Solostar Pen Generic drug: Insulin Glargine INNJECT 50 UNITS S.Q. ONCE DAILY AT 10 P.M. What changed: See the new instructions.   losartan 25 MG tablet Commonly known as: COZAAR Take 1 tablet (25 mg total) by mouth every evening.   metFORMIN 500 MG tablet Commonly known as: GLUCOPHAGE TAKE 1 TABLET BY MOUTH TWICE DAILY WITH MEALS.   metoCLOPramide 10 MG/10ML Soln Commonly known as: REGLAN Take 10 mLs (10 mg total) by mouth 3 (three) times daily.   multivitamin with minerals Tabs tablet Take 1 tablet by mouth daily.   nitroGLYCERIN 0.4 MG SL tablet Commonly known as: Nitrostat Place 1 tablet (0.4 mg total) under the tongue every 5 (five) minutes as needed.   NovoLOG FlexPen 100 UNIT/ML FlexPen Generic drug: insulin aspart INJECT 12-18 UNITS S.Q. THREE TIMES DAILY WITH MEALS. What changed: See the new instructions.   ondansetron 4 MG disintegrating tablet Commonly known as: Zofran ODT Take 1 tablet (4 mg  total) by mouth every 8 (eight) hours as needed for nausea or vomiting.   potassium chloride 10 MEQ tablet Commonly known as: K-DUR Take 1 tablet (10 mEq total) by mouth 2 (two) times daily.   promethazine 12.5 MG tablet Commonly known as: PHENERGAN Take 1 tablet (12.5 mg total) by mouth every 6 (six) hours as needed for nausea or vomiting.   sertraline 100 MG tablet Commonly known as: ZOLOFT Take 200 mg by mouth at bedtime.   spironolactone 25 MG tablet Commonly known as: ALDACTONE Take 0.5 tablets (12.5 mg total) by mouth daily.   ticagrelor 90 MG Tabs tablet Commonly known as: [name] Take 1 tablet (90 mg total) by mouth 2 (two) times daily.       Major procedures and Radiology Reports - PLEASE review detailed and final reports for all details, in brief -    Dg Chest 2 View  Result Date: 05/03/2019 CLINICAL DATA:  Chest pain since last night, crushing chest pain relieved by 2 nitroglycerin tablets, history coronary artery disease post MI, chronic systolic CHF, type I diabetes mellitus, hypertension EXAM: CHEST - 2 VIEW COMPARISON:  01/09/2019 FINDINGS: Normal heart size, mediastinal contours, and pulmonary vascularity. Lungs clear. No infiltrate, pleural effusion or pneumothorax. Bones unremarkable. IMPRESSION: No acute abnormalities. Electronically Signed   By: Lavonia Dana M.D.   On: 05/03/2019 12:50    Today   Subjective    Vasilia Gilder today has no new concerns, chest pains, no shortness of breath, no palpitation, no further emesis, some nausea persist          Patient has been seen and examined prior to discharge   Objective   Blood pressure 131/88, pulse 82, temperature 98.2 F (36.8 C), temperature source Oral, resp. rate 14, height 5'  10" (1.778 m), weight 86.9 kg, last menstrual period 05/03/2019, SpO2 97 %.   Intake/Output Summary (Last 24 hours) at 05/04/2019 1251 Last data filed at 05/04/2019 0300 Gross per 24 hour  Intake 240 ml  Output 500 ml  Net  -260 ml   Exam Gen:- Awake Alert, no acute distress  HEENT:- [place].AT, No sclera icterus Neck-Supple Neck,No JVD,.  Lungs-  CTAB , good air movement bilaterally  CV- S1, S2 normal, regular Abd-  +ve B.Sounds, Abd Soft, No tenderness,    Extremity:- No  edema,   good pulses, prior amputation of right fourth and fifth metatarsals Psych-affect is appropriate, oriented x3 Neuro-no new focal deficits, no tremors  Skin-    Wound type: Neuropathic- POA:  Measurement: 0.2 cm x 6 cm unable to view depth to left lateral foot.  Left fourth toe, purulence between toe.  Wound VVO:[id number] tissue  Drainage (amount, consistency, odor) moderate purulence Periwound:edema and erythema   Data Review   CBC w Diff:  Lab Results  Component Value Date   WBC 9.4 05/03/2019   HGB 12.8 05/03/2019   HCT 37.2 05/03/2019   PLT 321 05/03/2019   LYMPHOPCT 22 01/09/2019   MONOPCT 6 01/09/2019   EOSPCT 3 01/09/2019   BASOPCT 0 01/09/2019    CMP:  Lab Results  Component Value Date   NA 137 05/03/2019   NA 138 01/16/2019   K 3.9 05/03/2019   CL 96 (L) 05/03/2019   CO2 29 05/03/2019   BUN 24 (H) 05/03/2019   BUN 17 01/16/2019   CREATININE 1.60 (H) 05/03/2019   CREATININE 1.01 11/04/2015   PROT 8.0 01/09/2019   ALBUMIN 4.0 01/09/2019   BILITOT 0.4 01/09/2019   ALKPHOS 49 01/09/2019   AST 21 01/09/2019   ALT 27 01/09/2019  .  Total Discharge time is about 33 minutes  Roxan Hockey M.D on 05/04/2019 at 12:51 PM  Go to [email or web address] -  for contact info  [facility] - Office  [phone]

## 2019-05-04 NOTE — Consult Note (Addendum)
Cardiology Consult    Patient ID: Anne [name]; [id number]; 09-Sep-1981   Admit date: 05/03/2019 Date of Consult: 05/04/2019  Primary Care Provider: Alfonse Flavors, MD Primary Cardiologist: Anne Carnes, MD  Primary Electrophysiologist: Dr. Lovena [name]  Patient Profile    Anne [name] is a 38 y.o. female with past medical history of CAD (s/p late presenting MI in 10/2018 with cath showing 100% 2nd Mrg occlusion with R--> L collaterals, 25% distal LM, and 99% mid-LAD treated with POBA as vessel felt to be too small for stenting or CABG), chronic systolic CHF (reduced EF of 30-35%), Type 1 DM, HTN, HLD, Type 2 DM with gastroparesis and Osteomyelitis (s/p R forefoot amputation) who is being seen today for the evaluation of chest pain at the request of Dr. Denton [name].   History of Present Illness    Anne [name] most recently had a telehealth visit with Anne Dopp, PA-C on 03/08/2019 reported occasional episodes of interscapular back pain at that time with associated dyspnea.  Reported that her weight had overall been stable. She had recently been referred to Dr. Lovena [name] for consideration of ICD placement and was recommended to postpone this until her foot wound had completely healed. Was continued on ASA, [name], Atorvastatin, Coreg, Losartan, and Spironolactone (previously intolerant to Entresto secondary to dizziness and hypotension). Lasix was further titrated given concerns for volume overload and it was recommended she take 40 mg twice daily for 3 days and then resume her normal dose.  She called the office on 05/03/2019 reporting recurrent episodes of chest pain with associated nausea and vomiting, therefore ED evaluation was recommended.  She reports that she had frequent episodes of nausea and vomiting prior to having her prior MI which she was informed was due to gastroparesis (followed by Anne [name] in [place]). Over the past few weeks, she has started to experience more frequent  episodes of nausea and vomiting and is unsure if her pills are actually getting absorbed. She has tried taking these with and without food consumption to see if this influences her symptoms. Her episode of chest discomfort the other day occurred while she was actively vomiting. She denies any episodes of exertional chest pain or any symptoms that resemble her prior angina. She has baseline dyspnea on exertion with no recent change in her symptoms. Denies any recent orthopnea, PND, or lower extremity edema. Says that she has actually lost over 20 pounds within the past several months due to decreased food consumption.  Initial labs showed WBC 9.4, Hgb 12.8, platelets 321, Na+ 137, K+ 3.9, and creatinine 1.60 (baseline 1.5 - 1.6).  COVID testing pending.  Initial and cyclic troponin values have been negative thus far. CXR shows no acute cardiopulmonary abnormalities. EKG shows NSR, HR 87, with slight ST depression along inferior leads.    Past Medical History:  Diagnosis Date  . Acid reflux   . Anxiety   . Arthritis   . 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 CAD (coronary artery disease) 11/13/2018   Late presentation anterior MI 12/19 >> LHC - dLM 25, mLAD 99, oOM2 100 (R-L collats), irreg RCA, EF 25-35 >> PCI: POBA to mLAD  . Chronic systolic CHF (congestive heart failure) ([place]) 11/28/2018   Ischemic CM // late presentation ant MI 10/2018 tx with POBA to LAD (residual CAD with CTO of the OM2) // Echo 12/19:  No mural apical thrombus, septal, apical mid ant and inf HK;  mild LVH, EF 30-35, mild MR // Echo 01/2019: EF 30-35, Gr 1 DD, diff HK, apical AK, mild MR   . CKD (chronic kidney disease), stage I   . Diabetes mellitus type 1 ([place])   . Former tobacco use   . Gastroparesis   . Hypercholesteremia   . Hypertension   . Osteomyelitis ([place])    a. s/p R forefoot amputation.  . Renal failure   . Sciatica     Past Surgical History:  Procedure Laterality Date  .  AMPUTATION Right 11/03/2011   Procedure: AMPUTATION RAY;  Surgeon: Anne [name];  Location: [facility];  Service: General;  Laterality: Right;  Right fourth and fifth metatarsal   . APPENDECTOMY    . CESAREAN SECTION  2004 and 2007   x2  . CORONARY/GRAFT ACUTE MI REVASCULARIZATION N/A 11/11/2018   Procedure: CORONARY/GRAFT ACUTE MI REVASCULARIZATION;  Surgeon: Anne Booze, MD;  Location: [facility];  Service: Cardiovascular;  Laterality: N/A;  . LEFT HEART CATH AND CORONARY ANGIOGRAPHY N/A 11/11/2018   Procedure: LEFT HEART CATH AND CORONARY ANGIOGRAPHY;  Surgeon: Anne Booze, MD;  Location: [facility];  Service: Cardiovascular;  Laterality: N/A;  . TUBAL LIGATION       Home Medications:  Prior to Admission medications   Medication Sig Start Date End Date Taking? Authorizing Provider  atorvastatin (LIPITOR) 80 MG tablet Take 1 tablet (80 mg total) by mouth daily at 6 PM. 11/15/18  Yes Bhagat, Bhavinkumar, PA  carvedilol (COREG) 6.25 MG tablet Take 1.5 tablets (9.375 mg total) by mouth 2 (two) times daily. 01/25/19  Yes Anne [name] T, PA-C  Exenatide ER (BYDUREON) 2 MG SRER Inject 2 mg into the skin every Thursday.    Yes [provider]  fluconazole (DIFLUCAN) 150 MG tablet Take 150 mg by mouth once.  05/02/19  Yes [provider]  furosemide (LASIX) 40 MG tablet Take 1 tablet (40 mg total) by mouth 2 (two) times daily. 03/17/19 06/15/19 Yes Weaver, Scott T, PA-C  gabapentin (NEURONTIN) 300 MG capsule Take 300 mg by mouth 3 (three) times daily as needed (for neuropathy pain).    Yes [provider]  GNP ASPIRIN LOW DOSE 81 MG EC tablet TAKE 1 TABLET BY MOUTH ONCE DAILY. Patient taking differently: Take 81 mg by mouth daily.  03/24/19  Yes Weaver, Scott T, PA-C  LANTUS SOLOSTAR 100 UNIT/ML Solostar Pen INNJECT 50 UNITS S.Q. ONCE DAILY AT 10 P.M. Patient taking differently: Inject 50 Units into the skin at bedtime.  02/03/16  Yes Nida,  Marella Chimes, MD  losartan (COZAAR) 25 MG tablet TAKE 1 TABLET BY MOUTH ONCE DAILY. Patient taking differently: Take 25 mg by mouth every evening.  03/24/19  Yes Weaver, Scott T, PA-C  metFORMIN (GLUCOPHAGE) 500 MG tablet TAKE 1 TABLET BY MOUTH TWICE DAILY WITH MEALS. Patient taking differently: Take 500 mg by mouth 2 (two) times daily with a meal.  02/03/16  Yes Nida, Marella Chimes, MD  metoCLOPramide (REGLAN) 10 MG/10ML SOLN Take 10 mg by mouth 3 (three) times daily.    Yes [provider]  Multiple Vitamin (MULTIVITAMIN WITH MINERALS) TABS tablet Take 1 tablet by mouth daily. 07/04/18  Yes Isaac Bliss, Rayford Halsted, MD  nitroGLYCERIN (NITROSTAT) 0.4 MG SL tablet Place 1 tablet (0.4 mg total) under the tongue every 5 (five) minutes as needed. 11/15/18  Yes Bhagat, Bhavinkumar, PA  NOVOLOG FLEXPEN 100 UNIT/ML FlexPen INJECT 12-18 UNITS S.Q. THREE TIMES DAILY WITH  MEALS. Patient taking differently: Inject 12-18 Units into the skin 3 (three) times daily. Per MD sliding scale for blood sugars over 150 04/29/16  Yes Nida, Marella Chimes, MD  ondansetron (ZOFRAN ODT) 4 MG disintegrating tablet Take 1 tablet (4 mg total) by mouth every 8 (eight) hours as needed for nausea or vomiting. 07/03/18  Yes Isaac Bliss, Rayford Halsted, MD  potassium chloride (K-DUR) 10 MEQ tablet Take 1 tablet (10 mEq total) by mouth 2 (two) times daily. 03/17/19 06/15/19 Yes Weaver, Scott T, PA-C  sertraline (ZOLOFT) 100 MG tablet Take 200 mg by mouth at bedtime.    Yes [provider]  spironolactone (ALDACTONE) 25 MG tablet Take 0.5 tablets (12.5 mg total) by mouth daily. 11/15/18  Yes Bhagat, Bhavinkumar, PA  sulfamethoxazole-trimethoprim (BACTRIM DS,SEPTRA DS) 800-160 MG tablet Take 1 tablet by mouth 2 (two) times daily.  03/01/19  Yes [provider]  ticagrelor ([name]) 90 MG TABS tablet Take 1 tablet (90 mg total) by mouth 2 (two) times daily. 12/15/18  Yes Bhagat, Crista Luria, PA    Inpatient  Medications: Scheduled Meds: . aspirin EC  81 mg Oral Daily  . atorvastatin  80 mg Oral q1800  . carvedilol  9.375 mg Oral BID  . enoxaparin (LOVENOX) injection  40 mg Subcutaneous Q24H  . Exenatide ER  2 mg Subcutaneous Q Thu  . furosemide  40 mg Oral BID  . insulin aspart  0-9 Units Subcutaneous Q4H  . insulin glargine  20 Units Subcutaneous QHS  . losartan  25 mg Oral QPM  . potassium chloride  10 mEq Oral BID  . sertraline  200 mg Oral QHS  . spironolactone  12.5 mg Oral Daily  . sulfamethoxazole-trimethoprim  1 tablet Oral BID  . ticagrelor  90 mg Oral BID   Continuous Infusions:  PRN Meds: acetaminophen **OR** acetaminophen, bisacodyl, metoCLOPramide, morphine injection, nitroGLYCERIN, ondansetron **OR** ondansetron (ZOFRAN) IV  Allergies:    Allergies  Allergen Reactions  . Canagliflozin Other (See Comments)    Vaginal yeast infections  . Nsaids Other (See Comments)    Yeast infection     Social History:   Social History   Socioeconomic History  . Marital status: Married    Spouse name: Not on file  . Number of children: 2  . Years of education: Not on file  . Highest education level: Not on file  Occupational History  . Not on file  Social Needs  . Financial resource strain: Not on file  . Food insecurity    Worry: Not on file    Inability: Not on file  . Transportation needs    Medical: Not on file    Non-medical: Not on file  Tobacco Use  . Smoking status: Former Smoker    Packs/day: 0.25    Years: 20.00    Pack years: 5.00    Types: Cigarettes    Quit date: 12/29/2013    Years since quitting: 5.3  . Smokeless tobacco: Never Used  Substance and Sexual Activity  . Alcohol use: No  . Drug use: No  . Sexual activity: Yes    Birth control/protection: Surgical  Lifestyle  . Physical activity    Days per week: Not on file    Minutes per session: Not on file  . Stress: Not on file  Relationships  . Social Herbalist on phone: Not on  file    Gets together: Not on file    Attends religious service: Not on  file    Active member of club or organization: Not on file    Attends meetings of clubs or organizations: Not on file    Relationship status: Not on file  . Intimate partner violence    Fear of current or ex partner: Not on file    Emotionally abused: Not on file    Physically abused: Not on file    Forced sexual activity: Not on file  Other Topics Concern  . Not on file  Social History Narrative  . Not on file     Family History:    Family History  Problem Relation Age of Onset  . Diabetes Father   . Lung cancer Father   . Alcoholism Father   . Asthma Mother   . Kidney disease Mother   . Anemia Mother        hemolytic  . Hypertension Mother   . Heart attack Paternal Grandmother   . Diabetes Paternal Grandmother   . Diabetes Paternal Grandfather   . Anesthesia problems Neg Hx   . Hypotension Neg Hx   . Malignant hyperthermia Neg Hx   . Pseudochol deficiency Neg Hx       Review of Systems    General:  No chills, fever, night sweats or weight changes.  Cardiovascular:  No dyspnea on exertion, edema, orthopnea, palpitations, paroxysmal nocturnal dyspnea. Positive for chest pain.  Dermatological: No rash, lesions/masses Respiratory: No cough, dyspnea Urologic: No hematuria, dysuria Abdominal:   No diarrhea, bright red blood per rectum, melena, or hematemesis. Positive for nausea and vomiting.  Neurologic:  No visual changes, wkns, changes in mental status. All other systems reviewed and are otherwise negative except as noted above.  Physical Exam/Data    Vitals:   05/03/19 1830 05/03/19 1900 05/03/19 2002 05/04/19 0531  BP: 135/87 (!) 137/93 (!) 141/91 131/88  Pulse: 76 93 91 82  Resp: (!) 23 18 16 14   Temp:   97.6 F (36.4 C) 98.2 F (36.8 C)  TempSrc:   Oral Oral  SpO2: 97% 99% 100% 97%  Weight:   86.9 kg   Height:   5\' 10"  (1.778 m)     Intake/Output Summary (Last 24 hours) at  05/04/2019 0859 Last data filed at 05/04/2019 0300 Gross per 24 hour  Intake 240 ml  Output 500 ml  Net -260 ml   Filed Weights   05/03/19 1232 05/03/19 2002  Weight: 85.7 kg 86.9 kg   Body mass index is 27.49 kg/m.   General: Pleasant, Caucasian female appearing in NAD Psych: Normal affect. Neuro: Alert and oriented X 3. Moves all extremities spontaneously. HEENT: Normal  Neck: Supple without bruits or JVD. Lungs:  Resp regular and unlabored, CTA without wheezing or rales. Heart: RRR no s3, s4, or murmurs. Abdomen: Soft, non-tender, non-distended, BS + x 4.  Extremities: No clubbing, cyanosis or edema. DP/PT/Radials 2+ and equal bilaterally.   EKG:  The EKG was personally reviewed and demonstrates: NSR, HR 87, with slight ST depression along inferior leads.   Telemetry:  Telemetry was personally reviewed and demonstrates: NSR, HR in 60's to 70's with no significant arrhythmias. Occasional PVC's.    Labs/Studies     Relevant CV Studies:  Cardiac Catheterization: 11/11/2018  Ost 2nd Mrg to 2nd Mrg lesion is 100% stenosed. Likely chronic occlusion with right to left collaterals.  Mid LAD lesion is 99% stenosed. PTCA with 2.0 mm balloon performed.  Post intervention, there is a 25% residual stenosis.  Dist LM lesion  is 25% stenosed.  There is severe left ventricular systolic dysfunction.  The left ventricular ejection fraction is 25-35% by visual estimate.  LV end diastolic pressure is mildly elevated.  There is no aortic valve stenosis.   Late presenting anterior MI.  Significant LV dysfunction noted.  She will need aggressive therapy for heart failure and would consider a heart failure consult.  Diffuse atherosclerosis, particularly in the LAD and circumflex.  Most severe area of disease was treated with a small balloon.  I do not think the vessel in the mid and distal portion is large enough for either stenting or bypass grafting.  Medical therapy.  Some of her  pain may be post MI pericarditis, given the character of the pain.  Limited Echo: 01/2019 IMPRESSIONS   1. The left ventricle has moderate-severely reduced systolic function, with an ejection fraction of 30-35%. The cavity size was moderately dilated. Left ventricular diastolic Doppler parameters are consistent with impaired relaxation. Left ventricular  diffuse hypokinesis.  2. The right ventricle has normal systolc function. The cavity was normal. There is no increase in right ventricular wall thickness.  3. Mild thickening of the aortic valve.  4. Definity used; global hypokinesis with apical akinesis; overall moderate to severe LV dysfunction; mild diastolic dysfunction; moderate LVE; mild MR.  Laboratory Data:  Chemistry Recent Labs  Lab 05/03/19 1311  NA 137  K 3.9  CL 96*  CO2 29  GLUCOSE 200*  BUN 24*  CREATININE 1.60*  CALCIUM 9.3  GFRNONAA 40*  GFRAA 47*  ANIONGAP 12    No results for input(s): PROT, ALBUMIN, AST, ALT, ALKPHOS, BILITOT in the last 168 hours. Hematology Recent Labs  Lab 05/03/19 1311  WBC 9.4  RBC 4.12  HGB 12.8  HCT 37.2  MCV 90.3  MCH 31.1  MCHC 34.4  RDW 12.9  PLT 321   Cardiac Enzymes Recent Labs  Lab 05/03/19 1311 05/03/19 1734 05/03/19 2331  TROPONINI <0.03 <0.03 <0.03   No results for input(s): TROPIPOC in the last 168 hours.  BNPNo results for input(s): BNP, PROBNP in the last 168 hours.  DDimer No results for input(s): DDIMER in the last 168 hours.  Radiology/Studies:  Dg Chest 2 View  Result Date: 05/03/2019 CLINICAL DATA:  Chest pain since last night, crushing chest pain relieved by 2 nitroglycerin tablets, history coronary artery disease post MI, chronic systolic CHF, type I diabetes mellitus, hypertension EXAM: CHEST - 2 VIEW COMPARISON:  01/09/2019 FINDINGS: Normal heart size, mediastinal contours, and pulmonary vascularity. Lungs clear. No infiltrate, pleural effusion or pneumothorax. Bones unremarkable. IMPRESSION: No  acute abnormalities. Electronically Signed   By: Lavonia Dana M.D.   On: 05/03/2019 12:50     Assessment & Plan    1. Atypical Chest Pain - Presented with episodes of nausea and vomiting which is likely secondary to her known gastroparesis and had an episode of chest discomfort while actively vomiting.  She denies any exertional chest pain and reports her current symptoms do not resemble her prior angina. - Initial and cyclic troponin values have been negative and EKG shows no acute ischemic changes. - Would not anticipate further ischemic evaluation at this time. Would consider [name] evaluation for further management of her gastroparesis.  2. CAD - she is s/p late presenting MI in 10/2018 with cath showing 100% 2nd Mrg occlusion with R--> L collaterals, 25% distal LM, and 99% mid-LAD treated with POBA as vessel felt to be too small for stenting or CABG. - enzymes negative  as outlined above. No recent exertional symptoms.  - continue current medication regimen with ASA 81mg  daily, Atorvastatin, Coreg, and [name].   3. Chronic Systolic CHF - she has a known reduced EF of 30-35%. Volume status appears close to baseline by examination. No recent orthopnea, PND, or edema. Weight has continued to decline on her home scales.  - Continue PTA Coreg, Losartan, Spironolactone, and Lasix at current dosing. Previously intolerant to [employer].  She is being followed by EP with plans for ICD placement once her lower extremity wounds heal completely.  4. HTN - BP has been well controlled this admission, at 131/88 on most recent check.  Continue current medication regimen.  5. Stage 3 CKD - baseline creatinine 1.5 - 1.6. Stable at 1.60 on admission.    For questions or updates, please contact [place] Please consult [email or web address] for contact info under Cardiology/STEMI.  Signed, Erma Heritage, PA-C 05/04/2019, 8:59 AM Pager: [phone]    Attending note:  Patient seen and examined.   Records reviewed and case discussed with Ms. Ahmed Prima PA-C.  Ms. Harrington is a patient of Dr. Harrington Challenger with a history of late presentation infarct back in December 2019 with occlusion of the second obtuse marginal associated with left-to-right collaterals, 20 to 5% distal left main stenosis, and a 99% mid LAD that was treated with angioplasty due to small vessel size (also felt not to be candidate for CABG at that time).  She has an ischemic cardiomyopathy with LVEF 30 to 35% pending ICD ultimately by Dr. Lovena [name] although this has been deferred until a heel wound completely resolves.  She reports compliance with her cardiac medications and presents to the hospital describing episodes of recurring nausea and emesis followed by chest discomfort with the emesis, typically occurring after eating or about an hour after taking her medications and suggestive of possible gastroparesis or gastritis.  She has had no hematemesis, no obvious changes in stools.  She states that the symptoms are different from her presentation back in December 2019.  On examination she appears comfortable this morning, no active symptoms.  He is afebrile, heart rate 82 and in sinus rhythm by telemetry which I personally reviewed, blood pressure 131/88.  Lungs are clear without labored breathing.  Cardiac exam reveals RRR without gallop or rub.  Lab work shows normal troponin I levels x3, potassium 3.9, BUN 24, creatinine 1.6, hemoglobin 12.8, platelets 321, SARS coronavirus 2 was negative.  Chest x-ray reports no acute process.  I personally reviewed her ECG which shows sinus rhythm with leftward axis and poor anterior R wave progression, rule out old anterior infarct.  Symptoms are suggestive of [name] etiology such as gastroparesis or gastritis.  She does not report exertional chest pain and has been otherwise compliant with her cardiac regimen.  Cardiac enzymes argue against ACS.  Would continue with present cardiac medications, consider  discussion with gastroenterology.  At this point, no further ischemic testing is planned.  Satira Sark, M.D., F.A.C.C.

## 2019-05-04 NOTE — Discharge Instructions (Signed)
1) left foot wound care ----cleanse left lateral foot/toe wound with NS and pat dry.  Apply Santyl to wound bed.  Cover with NS moist gauze.  Secure with gauze and kerlix/tape.  Change daily... Follow-up with wound clinic for recheck and further management 2) follow-up with your gastroenterologist Dr. Havery Moros for adjustment of your gastro-intestinal medications to help with your gastroparesis and nausea 3) follow-up with your cardiologist and Dr. Crissie Sickles to further discuss management of you coronary artery disease and ischemic cardiomyopathy and decide on the timing of your defibrillator placement 4)Low salt, consistent carbohydrate and low-fat diet advised 5) you are taking [name] and aspirin which are blood thinners so Avoid ibuprofen/Advil/Aleve/Motrin/Goody Powders/Naproxen/BC powders/Meloxicam/Diclofenac/Indomethacin and other Nonsteroidal anti-inflammatory medications as these will make you more likely to bleed and can cause stomach ulcers, can also cause Kidney problems.

## 2019-05-04 NOTE — Progress Notes (Addendum)
Inpatient Diabetes Program Recommendations  AACE/ADA: New Consensus Statement on Inpatient Glycemic Control (2015)  Target Ranges:  Prepandial:   less than 140 mg/dL      Peak postprandial:   less than 180 mg/dL (1-2 hours)      Critically ill patients:  140 - 180 mg/dL   Results for Anne [name], Anne [name] (MRN [id number]) as of 05/04/2019 08:54  Ref. Range 05/03/2019 12:39 05/03/2019 20:48 05/03/2019 23:46 05/04/2019 03:07 05/04/2019 07:30  Glucose-Capillary Latest Ref Range: 70 - 99 mg/dL 213 (H) 250 (H) 269 (H) 199 (H) 230 (H)    Review of Glycemic Control  Diabetes history: DM type 1 Outpatient Diabetes medications: Lantus 50 units, Metformin 500 mg bid, Novolog 12-18 units tid, Exenatide 2 mg weekly on Thursday  Current orders for Inpatient glycemic control:  Lantus 20 units qhs Novolog 0-9 units Q4 hours  Inpatient Diabetes Program Recommendations:    Glucose trends >200. Consider increasing basal insulin to Lantus 25-30 units qhs.  Thanks,  Tama Headings RN, MSN, BC-ADM Inpatient Diabetes Coordinator Team Pager [phone] (8a-5p)

## 2019-05-04 NOTE — Progress Notes (Signed)
IV, tele removed. Dressing to left lateral foot changed, verbalized understanding of procedure to change. D/C instructions reviewed, verbalized understanding. Stable patient to be transported to private vehicle via wheelchair when ride arrives.

## 2019-05-05 ENCOUNTER — Telehealth (HOSPITAL_COMMUNITY): Payer: Self-pay | Admitting: Radiology

## 2019-05-05 NOTE — Telephone Encounter (Signed)

## 2019-05-08 ENCOUNTER — Other Ambulatory Visit: Payer: Self-pay

## 2019-05-08 ENCOUNTER — Encounter (HOSPITAL_COMMUNITY): Payer: Self-pay

## 2019-05-08 ENCOUNTER — Ambulatory Visit (HOSPITAL_COMMUNITY): Payer: Medicaid Other | Attending: Cardiovascular Disease

## 2019-05-16 ENCOUNTER — Encounter (HOSPITAL_COMMUNITY): Payer: Self-pay | Admitting: Physician Assistant

## 2019-06-07 ENCOUNTER — Telehealth: Payer: Self-pay | Admitting: Internal Medicine

## 2019-06-07 NOTE — Telephone Encounter (Signed)

## 2019-06-08 ENCOUNTER — Ambulatory Visit: Payer: Medicaid Other | Admitting: Internal Medicine

## 2019-06-08 ENCOUNTER — Other Ambulatory Visit: Payer: Self-pay

## 2019-06-08 ENCOUNTER — Encounter: Payer: Self-pay | Admitting: Internal Medicine

## 2019-06-08 VITALS — BP 124/76 | HR 89 | Ht 70.0 in | Wt 193.8 lb

## 2019-06-08 DIAGNOSIS — I5022 Chronic systolic (congestive) heart failure: Secondary | ICD-10-CM

## 2019-06-08 DIAGNOSIS — E1159 Type 2 diabetes mellitus with other circulatory complications: Secondary | ICD-10-CM | POA: Diagnosis not present

## 2019-06-08 NOTE — Patient Instructions (Addendum)
Medication Instructions:  Your physician recommends that you continue on your current medications as directed. Please refer to the Current Medication list given to you today.  Labwork: None ordered.  Testing/Procedures: None ordered.  Follow-Up: Your physician wants you to follow-up in: as needed with Dr. Taylor.      Any Other Special Instructions Will Be Listed Below (If Applicable).  If you need a refill on your cardiac medications before your next appointment, please call your pharmacy.   

## 2019-06-08 NOTE — Progress Notes (Signed)
HPI Anne [name] returns today to discuss ICD insertion. 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 Over the past 3 months she has improved. Her wound is almost healed. She has not had recent syncope. She has class 2 CHF symptoms. Allergies  Allergen Reactions  . Canagliflozin Other (See Comments)    Vaginal yeast infections  . Nsaids Other (See Comments)    Yeast infection      Current Outpatient Medications  Medication Sig Dispense Refill  . acetaminophen (TYLENOL) 325 MG tablet Take 2 tablets (650 mg total) by mouth every 6 (six) hours as needed for mild pain (or Fever >/= 101). 12 tablet 0  . aspirin (GNP ASPIRIN LOW DOSE) 81 MG EC tablet Take 1 tablet (81 mg total) by mouth daily with breakfast. Swallow whole. 90 tablet 3  . atorvastatin (LIPITOR) 80 MG tablet Take 1 tablet (80 mg total) by mouth every evening. 30 tablet 6  . carvedilol (COREG) 6.25 MG tablet Take 1.5 tablets (9.375 mg total) by mouth 2 (two) times daily. 90 tablet 6  . collagenase (SANTYL) ointment Apply topically daily. Cleanse left lateral foot/toe wound with NS and pat dry.  Apply Santyl to wound bed.  Cover with NS moist gauze.  Secure with gauze and kerlix/tape.  Change daily 15 g 0  . Exenatide ER (BYDUREON) 2 MG SRER Inject 2 mg into the skin every Thursday.     . fluconazole (DIFLUCAN) 150 MG tablet Take 150 mg by mouth once.     . furosemide (LASIX) 40 MG tablet Take 1 tablet (40 mg total) by mouth 2 (two) times daily. 180 tablet 1  . gabapentin (NEURONTIN) 300 MG capsule Take 300 mg by mouth 3 (three) times daily as needed (for neuropathy pain).     Marland Kitchen LANTUS SOLOSTAR 100 UNIT/ML Solostar Pen INNJECT 50 UNITS S.Q. ONCE DAILY AT 10 P.M. (Patient taking differently: Inject 50 Units into the skin at bedtime. ) 15 mL 2  . losartan (COZAAR) 25 MG tablet Take 1 tablet (25 mg total) by mouth every evening. 30 tablet 5  .  metFORMIN (GLUCOPHAGE) 500 MG tablet TAKE 1 TABLET BY MOUTH TWICE DAILY WITH MEALS. (Patient taking differently: Take 500 mg by mouth 2 (two) times daily with a meal. ) 60 tablet 2  . metoCLOPramide (REGLAN) 10 MG/10ML SOLN Take 10 mLs (10 mg total) by mouth 3 (three) times daily. (Patient taking differently: Take 5 mg by mouth 3 (three) times daily with meals as needed for nausea. ) 1200 mL 2  . Multiple Vitamin (MULTIVITAMIN WITH MINERALS) TABS tablet Take 1 tablet by mouth daily.    . nitroGLYCERIN (NITROSTAT) 0.4 MG SL tablet Place 1 tablet (0.4 mg total) under the tongue every 5 (five) minutes as needed. 25 tablet 12  . NOVOLOG FLEXPEN 100 UNIT/ML FlexPen INJECT 12-18 UNITS S.Q. THREE TIMES DAILY WITH MEALS. (Patient taking differently: Inject 12-18 Units into the skin 3 (three) times daily. Per MD sliding scale for blood sugars over 150) 15 mL 3  . ondansetron (ZOFRAN ODT) 4 MG disintegrating tablet Take 1 tablet (4 mg total) by mouth every 8 (eight) hours as needed for nausea or vomiting. 25 tablet 2  . potassium chloride (K-DUR) 10 MEQ tablet Take 1 tablet (10 mEq total) by mouth 2 (two) times daily. 180 tablet 1  . promethazine (PHENERGAN) 12.5 MG tablet Take 1 tablet (12.5  mg total) by mouth every 6 (six) hours as needed for nausea or vomiting. 15 tablet 0  . sertraline (ZOLOFT) 100 MG tablet Take 200 mg by mouth at bedtime.     Marland Kitchen spironolactone (ALDACTONE) 25 MG tablet Take 0.5 tablets (12.5 mg total) by mouth daily. 30 tablet 6  . ticagrelor ([name]) 90 MG TABS tablet Take 1 tablet (90 mg total) by mouth 2 (two) times daily. 60 tablet 11   No current facility-administered medications for this visit.      Past Medical History:  Diagnosis Date  . Acid reflux   . Anxiety   . Arthritis   . 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 CAD (coronary artery disease) 11/13/2018   Late presentation anterior MI 12/19 >> LHC - dLM 25, mLAD 99, oOM2 100 (R-L collats),  irreg RCA, EF 25-35 >> PCI: POBA to mLAD  . Chronic systolic CHF (congestive heart failure) ([place]) 11/28/2018   Ischemic CM // late presentation ant MI 10/2018 tx with POBA to LAD (residual CAD with CTO of the OM2) // Echo 12/19:  No mural apical thrombus, septal, apical mid ant and inf HK; mild LVH, EF 30-35, mild MR // Echo 01/2019: EF 30-35, Gr 1 DD, diff HK, apical AK, mild MR   . CKD (chronic kidney disease), stage I   . Diabetes mellitus type 1 ([place])   . Former tobacco use   . Gastroparesis   . Hypercholesteremia   . Hypertension   . Osteomyelitis ([place])    a. s/p R forefoot amputation.  . Renal failure   . Sciatica     ROS:   All systems reviewed and negative except as noted in the HPI.   Past Surgical History:  Procedure Laterality Date  . AMPUTATION Right 11/03/2011   Procedure: AMPUTATION RAY;  Surgeon: Jamesetta So;  Location: [facility];  Service: General;  Laterality: Right;  Right fourth and fifth metatarsal   . APPENDECTOMY    . CESAREAN SECTION  2004 and 2007   x2  . CORONARY/GRAFT ACUTE MI REVASCULARIZATION N/A 11/11/2018   Procedure: CORONARY/GRAFT ACUTE MI REVASCULARIZATION;  Surgeon: Jettie Booze, MD;  Location: [place] [facility];  Service: Cardiovascular;  Laterality: N/A;  . LEFT HEART CATH AND CORONARY ANGIOGRAPHY N/A 11/11/2018   Procedure: LEFT HEART CATH AND CORONARY ANGIOGRAPHY;  Surgeon: Jettie Booze, MD;  Location: [place] [facility];  Service: Cardiovascular;  Laterality: N/A;  . TUBAL LIGATION       Family History  Problem Relation Age of Onset  . Diabetes Father   . Lung cancer Father   . Alcoholism Father   . Asthma Mother   . Kidney disease Mother   . Anemia Mother        hemolytic  . Hypertension Mother   . Heart attack Paternal Grandmother   . Diabetes Paternal Grandmother   . Diabetes Paternal Grandfather   . Anesthesia problems Neg Hx   . Hypotension Neg Hx   . Malignant hyperthermia Neg Hx   . Pseudochol deficiency  Neg Hx      Social History   Socioeconomic History  . Marital status: Married    Spouse name: Not on file  . Number of children: 2  . Years of education: Not on file  . Highest education level: Not on file  Occupational History  . Not on file  Social Needs  . Financial resource strain: Not on  file  . Food insecurity    Worry: Not on file    Inability: Not on file  . Transportation needs    Medical: Not on file    Non-medical: Not on file  Tobacco Use  . Smoking status: Former Smoker    Packs/day: 0.25    Years: 20.00    Pack years: 5.00    Types: Cigarettes    Quit date: 12/29/2013    Years since quitting: 5.4  . Smokeless tobacco: Never Used  Substance and Sexual Activity  . Alcohol use: No  . Drug use: No  . Sexual activity: Yes    Birth control/protection: Surgical  Lifestyle  . Physical activity    Days per week: Not on file    Minutes per session: Not on file  . Stress: Not on file  Relationships  . Social Herbalist on phone: Not on file    Gets together: Not on file    Attends religious service: Not on file    Active member of club or organization: Not on file    Attends meetings of clubs or organizations: Not on file    Relationship status: Not on file  . Intimate partner violence    Fear of current or ex partner: Not on file    Emotionally abused: Not on file    Physically abused: Not on file    Forced sexual activity: Not on file  Other Topics Concern  . Not on file  Social History Narrative  . Not on file     BP 124/76   Pulse 89   Ht 5\' 10"  (1.778 m)   Wt 193 lb 12.8 oz (87.9 kg)   SpO2 99%   BMI 27.81 kg/m   Physical Exam:  Well appearing NAD HEENT: Unremarkable Neck:  No JVD, no thyromegally Lymphatics:  No adenopathy Back:  No CVA tenderness Lungs:  Clear with no wheezes HEART:  Regular rate rhythm, no murmurs, no rubs, no clicks Abd:  soft, positive bowel sounds, no organomegally, no rebound, no guarding Ext:  2  plus pulses, no edema, no cyanosis, no clubbing Skin:  No rashes no nodules Neuro:  CN II through XII intact, motor grossly intact  Assess/Plan: 1. Chronic systolic heart failure - her symptoms are class 2. She will continue her current meds. 2. ICM - she denies anginal symptoms.  3. HTN - her blood pressure is elevated  Anne [name].D.

## 2019-06-12 ENCOUNTER — Ambulatory Visit (HOSPITAL_COMMUNITY): Payer: Medicaid Other

## 2019-06-12 ENCOUNTER — Other Ambulatory Visit: Payer: Self-pay

## 2019-06-12 ENCOUNTER — Encounter (HOSPITAL_COMMUNITY): Payer: Self-pay

## 2019-06-19 ENCOUNTER — Other Ambulatory Visit: Payer: Medicaid Other

## 2019-06-19 ENCOUNTER — Other Ambulatory Visit: Payer: Self-pay

## 2019-06-19 DIAGNOSIS — Z20822 Contact with and (suspected) exposure to covid-19: Secondary | ICD-10-CM

## 2019-06-21 LAB — NOVEL CORONAVIRUS, NAA: SARS-CoV-2, NAA: NOT DETECTED

## 2019-06-26 ENCOUNTER — Telehealth: Payer: Self-pay | Admitting: Internal Medicine

## 2019-06-26 NOTE — Telephone Encounter (Signed)
Agree. If her chest pain pattern increases and she has to take more and more NTG, she will need earlier follow up. Richardson Dopp, PA-C    06/26/2019 4:48 PM

## 2019-06-26 NOTE — Telephone Encounter (Signed)
Returned call to Pt.  Per Pt she had some chest tightness with mild sob this am and took a nitro.  Chest tightness and sob were resolved with one nitro.  Per Pt when wound nurse came to assess her foot wounds today the wound nurse said she needed to call office to report that she took a nitro.    Advised Pt that she had taken nitro correctly.   Advised if Pt took a nitro now and then for occasional chest tightness that was the right thing to do.  Advised if she ever needed to take 2 nitro tabs to relieve chest discomfort or if she took 2-3 tabs with no relief (call 911).    Pt is overdue to follow up with Dr. Harrington Challenger.  Advised this nurse would make appointment for Pt to follow up.

## 2019-06-26 NOTE — Telephone Encounter (Signed)
New Message      Pt c/o of Chest Pain: STAT if CP now or developed within 24 hours  1. Are you having CP right now? Not now, she said this morning about 7am she had some chest tightness   2. Are you experiencing any other symptoms (ex. SOB, nausea, vomiting, sweating)? Did have  SOB   3. How long have you been experiencing CP?  She says she has it once in a while in the past   4. Is your CP continuous or coming and going? Continuous   5. Have you taken Nitroglycerin? Yes, 1 x this morning  ?

## 2019-07-17 ENCOUNTER — Emergency Department (HOSPITAL_COMMUNITY): Payer: Medicaid Other

## 2019-07-17 ENCOUNTER — Other Ambulatory Visit: Payer: Self-pay

## 2019-07-17 ENCOUNTER — Encounter (HOSPITAL_COMMUNITY): Payer: Self-pay | Admitting: Emergency Medicine

## 2019-07-17 ENCOUNTER — Emergency Department (HOSPITAL_COMMUNITY)
Admission: EM | Admit: 2019-07-17 | Discharge: 2019-07-17 | Disposition: A | Discharge status: Home / Self Care | Payer: Medicaid Other | Attending: Emergency Medicine | Admitting: Emergency Medicine

## 2019-07-17 DIAGNOSIS — I2510 Atherosclerotic heart disease of native coronary artery without angina pectoris: Secondary | ICD-10-CM | POA: Insufficient documentation

## 2019-07-17 DIAGNOSIS — Y998 Other external cause status: Secondary | ICD-10-CM | POA: Insufficient documentation

## 2019-07-17 DIAGNOSIS — S299XXA Unspecified injury of thorax, initial encounter: Secondary | ICD-10-CM | POA: Diagnosis present

## 2019-07-17 DIAGNOSIS — S2231XA Fracture of one rib, right side, initial encounter for closed fracture: Secondary | ICD-10-CM | POA: Insufficient documentation

## 2019-07-17 DIAGNOSIS — Z79899 Other long term (current) drug therapy: Secondary | ICD-10-CM | POA: Insufficient documentation

## 2019-07-17 DIAGNOSIS — E1140 Type 2 diabetes mellitus with diabetic neuropathy, unspecified: Secondary | ICD-10-CM | POA: Diagnosis not present

## 2019-07-17 DIAGNOSIS — Z7982 Long term (current) use of aspirin: Secondary | ICD-10-CM | POA: Diagnosis not present

## 2019-07-17 DIAGNOSIS — E1165 Type 2 diabetes mellitus with hyperglycemia: Secondary | ICD-10-CM | POA: Insufficient documentation

## 2019-07-17 DIAGNOSIS — Y929 Unspecified place or not applicable: Secondary | ICD-10-CM | POA: Diagnosis not present

## 2019-07-17 DIAGNOSIS — W01198A Fall on same level from slipping, tripping and stumbling with subsequent striking against other object, initial encounter: Secondary | ICD-10-CM | POA: Diagnosis not present

## 2019-07-17 DIAGNOSIS — Y9301 Activity, walking, marching and hiking: Secondary | ICD-10-CM | POA: Diagnosis not present

## 2019-07-17 DIAGNOSIS — I5022 Chronic systolic (congestive) heart failure: Secondary | ICD-10-CM | POA: Diagnosis not present

## 2019-07-17 DIAGNOSIS — Z794 Long term (current) use of insulin: Secondary | ICD-10-CM | POA: Diagnosis not present

## 2019-07-17 DIAGNOSIS — I130 Hypertensive heart and chronic kidney disease with heart failure and stage 1 through stage 4 chronic kidney disease, or unspecified chronic kidney disease: Secondary | ICD-10-CM | POA: Diagnosis not present

## 2019-07-17 DIAGNOSIS — E1122 Type 2 diabetes mellitus with diabetic chronic kidney disease: Secondary | ICD-10-CM | POA: Diagnosis not present

## 2019-07-17 DIAGNOSIS — R739 Hyperglycemia, unspecified: Secondary | ICD-10-CM

## 2019-07-17 DIAGNOSIS — N181 Chronic kidney disease, stage 1: Secondary | ICD-10-CM | POA: Insufficient documentation

## 2019-07-17 DIAGNOSIS — Z87891 Personal history of nicotine dependence: Secondary | ICD-10-CM | POA: Diagnosis not present

## 2019-07-17 LAB — CBC WITH DIFFERENTIAL/PLATELET
Abs Immature Granulocytes: 0.01 10*3/uL (ref 0.00–0.07)
Basophils Absolute: 0 10*3/uL (ref 0.0–0.1)
Basophils Relative: 1 %
Eosinophils Absolute: 0.3 10*3/uL (ref 0.0–0.5)
Eosinophils Relative: 4 %
HCT: 33.3 % — ABNORMAL LOW (ref 36.0–46.0)
Hemoglobin: 11.2 g/dL — ABNORMAL LOW (ref 12.0–15.0)
Immature Granulocytes: 0 %
Lymphocytes Relative: 31 %
Lymphs Abs: 2 10*3/uL (ref 0.7–4.0)
MCH: 29.7 pg (ref 26.0–34.0)
MCHC: 33.6 g/dL (ref 30.0–36.0)
MCV: 88.3 fL (ref 80.0–100.0)
Monocytes Absolute: 0.4 10*3/uL (ref 0.1–1.0)
Monocytes Relative: 6 %
Neutro Abs: 3.7 10*3/uL (ref 1.7–7.7)
Neutrophils Relative %: 58 %
Platelets: 203 10*3/uL (ref 150–400)
RBC: 3.77 MIL/uL — ABNORMAL LOW (ref 3.87–5.11)
RDW: 12.3 % (ref 11.5–15.5)
WBC: 6.3 10*3/uL (ref 4.0–10.5)
nRBC: 0 % (ref 0.0–0.2)

## 2019-07-17 LAB — BASIC METABOLIC PANEL
Anion gap: 11 (ref 5–15)
BUN: 20 mg/dL (ref 6–20)
CO2: 23 mmol/L (ref 22–32)
Calcium: 8.8 mg/dL — ABNORMAL LOW (ref 8.9–10.3)
Chloride: 98 mmol/L (ref 98–111)
Creatinine, Ser: 1.21 mg/dL — ABNORMAL HIGH (ref 0.44–1.00)
GFR calc Af Amer: >60 mL/min (ref >60)
GFR calc non Af Amer: 57 mL/min — ABNORMAL LOW (ref >60)
Glucose, Bld: 449 mg/dL — ABNORMAL HIGH (ref 70–99)
Potassium: 3.8 mmol/L (ref 3.5–5.1)
Sodium: 132 mmol/L — ABNORMAL LOW (ref 135–145)

## 2019-07-17 LAB — GLUCOSE, CAPILLARY
Glucose-Capillary: 393 mg/dL — ABNORMAL HIGH (ref 70–99)
Glucose-Capillary: 346 mg/dL — ABNORMAL HIGH (ref 70–99)

## 2019-07-17 MED ORDER — OXYCODONE-ACETAMINOPHEN 5-325 MG PO TABS: 1.0000 | ORAL_TABLET | ORAL | 0 refills | Status: DC | PRN

## 2019-07-17 MED ORDER — OXYCODONE-ACETAMINOPHEN 5-325 MG PO TABS
2.0000 | ORAL_TABLET | Freq: Once | ORAL | Status: AC
  Administered 2019-07-17: 2 via ORAL
  Filled 2019-07-17: qty 2

## 2019-07-17 MED ORDER — INSULIN ASPART 100 UNIT/ML ~~LOC~~ SOLN
12.0000 [IU] | Freq: Once | SUBCUTANEOUS | Status: AC
  Administered 2019-07-17: 21:00:00 12 [IU] via SUBCUTANEOUS
  Filled 2019-07-17: qty 1

## 2019-07-17 NOTE — Discharge Instructions (Addendum)
You may take the oxycodone prescribed for pain relief.  This will make you drowsy - do not drive within 4 hours of taking this medication.  Use the incentive spirometer as instructed to help minimize the risk of complications as this rib heals.  Get rechecked immediately for any increased shortness of breath or if you develop any fever or coughing.  It will probably take several weeks before you are significantly more comfortable, splint your chest wall if you feel the urge to cough or sneeze.  You may find a heating pad applied to the site for 20 minutes several times daily can also be soothing.  As you know, your blood glucose is very elevated this evening.  Make sure you are watching your blood sugars closely and continue using your sliding scale insulin to get this under better control.

## 2019-07-17 NOTE — ED Triage Notes (Signed)
Pt states she fell on Saturday morning and she fell and injured her right ribs. States pain all across right side of chest with large bruise.

## 2019-07-18 NOTE — ED Provider Notes (Signed)
[facility] EMERGENCY DEPARTMENT Provider Note   CSN: [id number] Arrival date & time: 07/17/19  1530     History   Chief Complaint Chief Complaint  Patient presents with   Rib Injury    HPI Anne [name] is a 38 y.o. female with a history CAD with MI 12/19, IDDM, CHF, CKD stage 1, HTN, gastroparesis presenting for evaluation of right chest wall pain after tripping and falling yesterday morning, striking her right ribs during the event.  She was initially just sore, but has developed increased pain at the site along with bruising and pain with deep inspiration, palpation and movement. She denies head injury, back or neck pain, no n/v or abdominal pain and denies extremity injury or pain as well.  She has taken tylenol with no significant improvement, also took an oxycodone (left over from a previous prescription) which offered some relief.    Also expresses concern that she has missed 2 doses of her insulin while waiting here, although also denies eating since this am.       The history is provided by the patient.    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 [facility])    CAD (coronary artery disease) 11/13/2018   Late presentation anterior MI 12/19 >> LHC - dLM 25, mLAD 99, oOM2 100 (R-L collats), irreg RCA, EF 25-35 >> PCI: POBA to mLAD   Chronic systolic CHF (congestive heart failure) ([place]) 11/28/2018   Ischemic CM // late presentation ant MI 10/2018 tx with POBA to LAD (residual CAD with CTO of the OM2) // Echo 12/19:  No mural apical thrombus, septal, apical mid ant and inf HK; mild LVH, EF 30-35, mild MR // Echo 01/2019: EF 30-35, Gr 1 DD, diff HK, apical AK, mild MR    CKD (chronic kidney disease), stage I    Diabetes mellitus type 1 ([place])    Former tobacco use    Gastroparesis    Hypercholesteremia    Hypertension    Osteomyelitis ([place])    a. s/p R forefoot  amputation.   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) 02/11/2012   Back pain 02/11/2012   Muscle spasm 02/11/2012   Hyperlipidemia associated with type 2 diabetes mellitus ([place]) 02/11/2012   Obesity 02/11/2012   Cellulitis in diabetic foot ([place]) 10/31/2011    Past Surgical History:  Procedure Laterality Date   AMPUTATION Right 11/03/2011   Procedure: AMPUTATION RAY;  Surgeon: Jamesetta So;  Location: [facility];  Service: General;  Laterality: Right;  Right fourth and fifth metatarsal    APPENDECTOMY     CESAREAN SECTION  2004 and 2007  x2   CORONARY/GRAFT ACUTE MI REVASCULARIZATION N/A 11/11/2018   Procedure: CORONARY/GRAFT ACUTE MI REVASCULARIZATION;  Surgeon: Jettie Booze, MD;  Location: [place] [facility];  Service: Cardiovascular;  Laterality: N/A;   LEFT HEART CATH AND CORONARY ANGIOGRAPHY N/A 11/11/2018   Procedure: LEFT HEART CATH AND CORONARY ANGIOGRAPHY;  Surgeon: Jettie Booze, MD;  Location: [place] [facility];  Service: Cardiovascular;  Laterality: N/A;   TUBAL LIGATION       OB History   No obstetric history on file.      Home Medications    Prior to Admission medications   Medication Sig Start Date End Date  Taking? Authorizing Provider  aspirin (GNP ASPIRIN LOW DOSE) 81 MG EC tablet Take 1 tablet (81 mg total) by mouth daily with breakfast. Swallow whole. 05/04/19  Yes Roxan Hockey, MD  atorvastatin (LIPITOR) 80 MG tablet Take 1 tablet (80 mg total) by mouth every evening. 05/04/19  Yes Emokpae, Courage, MD  carvedilol (COREG) 6.25 MG tablet Take 1.5 tablets (9.375 mg total) by mouth 2 (two) times daily. 05/04/19  Yes Roxan Hockey, MD  collagenase (SANTYL) ointment Apply topically daily. Cleanse left lateral foot/toe wound with NS and pat dry.  Apply Santyl to wound bed.  Cover with NS moist gauze.  Secure with gauze and kerlix/tape.  Change daily 05/04/19  Yes Roxan Hockey, MD  Exenatide ER (BYDUREON) 2 MG SRER Inject 2 mg into the skin every Thursday.    Yes [provider]  furosemide (LASIX) 40 MG tablet Take 1 tablet (40 mg total) by mouth 2 (two) times daily. 05/04/19 08/02/19 Yes Emokpae, Courage, MD  gabapentin (NEURONTIN) 300 MG capsule Take 300 mg by mouth 3 (three) times daily as needed (for neuropathy pain).    Yes [provider]  LANTUS SOLOSTAR 100 UNIT/ML Solostar Pen INNJECT 50 UNITS S.Q. ONCE DAILY AT 10 P.M. Patient taking differently: Inject 50 Units into the skin at bedtime.  02/03/16  Yes Nida, Marella Chimes, MD  losartan (COZAAR) 25 MG tablet Take 1 tablet (25 mg total) by mouth every evening. 05/04/19  Yes Emokpae, Courage, MD  metFORMIN (GLUCOPHAGE) 500 MG tablet TAKE 1 TABLET BY MOUTH TWICE DAILY WITH MEALS. Patient taking differently: Take 500 mg by mouth 3 (three) times daily.  02/03/16  Yes Nida, Marella Chimes, MD  metoCLOPramide (REGLAN) 10 MG/10ML SOLN Take 10 mLs (10 mg total) by mouth 3 (three) times daily. Patient taking differently: Take 5 mg by mouth 3 (three) times daily with meals as needed for nausea.  05/04/19  Yes Emokpae, Courage, MD  nitroGLYCERIN (NITROSTAT) 0.4 MG SL tablet Place 1 tablet (0.4 mg total) under the tongue every 5 (five)  minutes as needed. 05/04/19  Yes Emokpae, Courage, MD  NOVOLOG FLEXPEN 100 UNIT/ML FlexPen INJECT 12-18 UNITS S.Q. THREE TIMES DAILY WITH MEALS. Patient taking differently: Inject 12-18 Units into the skin 3 (three) times daily. Per MD sliding scale for blood sugars over 150 04/29/16  Yes Nida, Marella Chimes, MD  ondansetron (ZOFRAN ODT) 4 MG disintegrating tablet Take 1 tablet (4 mg total) by mouth every 8 (eight) hours as needed for nausea or vomiting. 05/04/19  Yes Emokpae, Courage, MD  potassium chloride (K-DUR) 10 MEQ tablet Take 1 tablet (10 mEq total) by mouth 2 (two) times daily. 05/04/19 08/02/19 Yes Roxan Hockey, MD  promethazine (PHENERGAN) 12.5 MG tablet Take 1 tablet (12.5 mg total) by mouth every 6 (six) hours as needed for nausea or vomiting. 05/04/19  Yes  Roxan Hockey, MD  sertraline (ZOLOFT) 100 MG tablet Take 200 mg by mouth at bedtime.    Yes [provider]  spironolactone (ALDACTONE) 25 MG tablet Take 0.5 tablets (12.5 mg total) by mouth daily. 05/04/19  Yes Roxan Hockey, MD  ticagrelor ([name]) 90 MG TABS tablet Take 1 tablet (90 mg total) by mouth 2 (two) times daily. 05/04/19  Yes Emokpae, Courage, MD  oxyCODONE-acetaminophen (PERCOCET/ROXICET) 5-325 MG tablet Take 1 tablet by mouth every 4 (four) hours as needed. 07/17/19   Evalee Jefferson, PA-C    Family History Family History  Problem Relation Age of Onset   Diabetes Father    Lung cancer Father    Alcoholism Father    Asthma Mother    Kidney disease Mother    Anemia Mother        hemolytic   Hypertension Mother    Heart attack Paternal Grandmother    Diabetes Paternal Grandmother    Diabetes Paternal Grandfather    Anesthesia problems Neg Hx    Hypotension Neg Hx    Malignant hyperthermia Neg Hx    Pseudochol deficiency Neg Hx     Social History Social History   Tobacco Use   Smoking status: Former Smoker    Packs/day: 0.25    Years: 20.00    Pack years: 5.00    Types:  Cigarettes    Quit date: 12/29/2013    Years since quitting: 5.5   Smokeless tobacco: Never Used  Substance Use Topics   Alcohol use: No   Drug use: No     Allergies   Canagliflozin and Nsaids   Review of Systems Review of Systems  Constitutional: Negative for chills and fever.  HENT: Negative for congestion and sore throat.   Eyes: Negative.   Respiratory: Negative for chest tightness and shortness of breath.   Cardiovascular: Positive for chest pain. Negative for palpitations.  Gastrointestinal: Negative for abdominal pain, nausea and vomiting.  Genitourinary: Negative.   Musculoskeletal: Positive for arthralgias. Negative for joint swelling and neck pain.  Skin: Negative.  Negative for rash and wound.  Neurological: Negative for dizziness, weakness, light-headedness, numbness and headaches.  Psychiatric/Behavioral: Negative.      Physical Exam Updated Vital Signs BP (!) 138/91 (BP Location: Right Arm)    Pulse 81    Temp 98 F (36.7 C) (Oral)    Resp 18    Ht 5\' 10"  (1.778 m)    Wt 83.9 kg    LMP 06/26/2019    SpO2 100%    BMI 26.54 kg/m   Physical Exam Vitals signs and nursing note reviewed.  Constitutional:      Appearance: She is well-developed.  HENT:     Head: Normocephalic and atraumatic.     Mouth/Throat:     Mouth: Mucous membranes are moist.  Eyes:     Conjunctiva/sclera: Conjunctivae normal.  Neck:     Musculoskeletal: Normal range of motion.  Cardiovascular:     Rate and Rhythm: Normal rate and regular rhythm.     Heart sounds: Normal heart sounds.  Pulmonary:     Effort: Pulmonary effort is normal.     Breath sounds: Normal breath sounds. No stridor or decreased air movement. No decreased breath sounds or wheezing.  Chest:     Chest wall: Tenderness present. No mass, deformity, swelling or crepitus.    Abdominal:     General: Bowel sounds are normal.     Palpations: Abdomen is soft.     Tenderness:  There is no abdominal tenderness.    Musculoskeletal: Normal range of motion.  Skin:    General: Skin is warm and dry.  Neurological:     Mental Status: She is alert.      ED Treatments / Results  Labs (all labs ordered are listed, but only abnormal results are displayed) Labs Reviewed  GLUCOSE, CAPILLARY - Abnormal; Notable for the following components:      Result Value   Glucose-Capillary 393 (*)    All other components within normal limits  BASIC METABOLIC PANEL - Abnormal; Notable for the following components:   Sodium 132 (*)    Glucose, Bld 449 (*)    Creatinine, Ser 1.21 (*)    Calcium 8.8 (*)    GFR calc non Af Amer 57 (*)    All other components within normal limits  CBC WITH DIFFERENTIAL/PLATELET - Abnormal; Notable for the following components:   RBC 3.77 (*)    Hemoglobin 11.2 (*)    HCT 33.3 (*)    All other components within normal limits  GLUCOSE, CAPILLARY - Abnormal; Notable for the following components:   Glucose-Capillary 346 (*)    All other components within normal limits  CBG MONITORING, ED    EKG EKG Interpretation  Date/Time:  Monday July 17 2019 15:55:48 EDT Ventricular Rate:  88 PR Interval:  134 QRS Duration: 88 QT Interval:  348 QTC Calculation: 421 R Axis:   -61 Text Interpretation:  Normal sinus rhythm Left anterior fascicular block Nonspecific T wave abnormality Abnormal ECG Confirmed by Orpah Greek [id number]) on 07/18/2019 12:15:38 PM   Radiology Dg Ribs Unilateral W/chest Right  Result Date: 07/17/2019 CLINICAL DATA:  Fall. Pain anterior lower right ribs. Bruising and swelling. EXAM: RIGHT RIBS AND CHEST - 3+ VIEW COMPARISON:  Chest x-ray 05/03/2019. FINDINGS: Fracture of the distal aspect of the right eleventh rib cannot be excluded. No other focal bony abnormalities. No pneumothorax. IMPRESSION: Fracture of the distal aspect of the right eleventh rib cannot be excluded. No pneumothorax. Electronically Signed   By: Marcello Moores  Register   On: 07/17/2019 16:16     Procedures Procedures (including critical care time)  Medications Ordered in ED Medications  oxyCODONE-acetaminophen (PERCOCET/ROXICET) 5-325 MG per tablet 2 tablet (2 tablets Oral Given 07/17/19 2016)  insulin aspart (novoLOG) injection 12 Units (12 Units Subcutaneous Given 07/17/19 2128)     Initial Impression / Assessment and Plan / ED Course  I have reviewed the triage vital signs and the nursing notes.  Pertinent labs & imaging results that were available during my care of the patient were reviewed by me and considered in my medical decision making (see chart for details).        Pt with contusion after trauma right chest wall with suspected single rib fx per rib detail cxr. She was prescribed oxycodone for her pain, given incentive spirometer with instructions, return precautions discussed.    cbg quite elevated without anion gap.  She is on sliding scale insulin at home along with qhs Lantus.  She was given aspart 12 units here and cbg reduced by 103 points by time of dc.  Pt unwilling to stay for further cbg reduction.  She will check her cbg's closely and continue to follow her home SSI regimen.    The patient appears reasonably screened and/or stabilized for discharge and I doubt any other medical condition or other [facility] requiring further screening, evaluation, or treatment in the ED at this time prior to discharge.  Final Clinical Impressions(s) / ED Diagnoses   Final diagnoses:  Closed fracture of one rib of right side, initial encounter  Hyperglycemia without ketosis    ED Discharge Orders         Ordered    oxyCODONE-acetaminophen (PERCOCET/ROXICET) 5-325 MG tablet  Every 4 hours PRN     07/17/19 2245           Evalee Jefferson, PA-C 07/18/19 1348    Nat Christen, MD 07/18/19 2044

## 2019-07-24 NOTE — Progress Notes (Signed)
[facility] Note  07/25/2019     CHIEF COMPLAINT Patient presents for Retina Evaluation   HISTORY OF PRESENT ILLNESS: Anne [name] is a 38 y.o. female who presents to the clinic today for:   HPI    Retina Evaluation    In both eyes.  This started weeks ago.  Duration of weeks.  Associated Symptoms Flashes and Floaters.  Context:  distance vision and near vision.  I, the attending physician,  performed the HPI with the patient and updated documentation appropriately.          Comments    BS: 186 fasting this morning A1c: 7.0 Last glucose per [facility] records: 449 (07/17/19) Patient states she had a loss of vision OS after heart attack in December of 2019.  Patient states OS is "almost black".  Patient states she has had two heart attacks..unsure of when first one occurred and states this never happened with the first heart attack.  Patient complains of blurry vision in her right eye but states her right eye has always been her weaker eye.  Patient recently broke glasses and has not gotten replacement.  Patient denies eye pain or discomfort.  Patient complains of occasional floaters OU and flashes of light in the left eye only. Patient has Hx with [facility] (Dr. Lavella Lemons) and history of laser OU.  Patient denies any history of injections in either eye.   Patient last saw Dr. Lavella Lemons in 2017 or 2018...was due to follow-up with Dr. Lavella Lemons in [place] but did not due to distance from home (Patient lives in [place])       Last edited by Bernarda Caffey, MD on 07/25/2019 11:29 PM. (History)    pt states she used to be seen at [facility] in [place], last appt with them was about 2017, pt states she was unable to continue care there due to distance from home, pt states she has had a couple heart attacks and a heart cath, pt states she had laser procedures in both eyes at [facility], but they never talked about having sx  Referring physician: Burt Ek, [place],  [place] [id number]  HISTORICAL INFORMATION:   Selected notes from the [facility] Referred by Dr. Stevenson Clinch for concern of VH / TRD OS LEE: 08.27.20 ([facility] [place]) [BC[facility]: OD: "blurry" OS" NLP] Ocular Hx- PMH-DM (a1c: 7.0, takes invokana, lantus, novolog, metformin), HLD, HTN   CURRENT MEDICATIONS: No current outpatient medications on file. (Ophthalmic Drugs)   No current facility-administered medications for this visit.  (Ophthalmic Drugs)   Current Outpatient Medications (Other)  Medication Sig  . aspirin (GNP ASPIRIN LOW DOSE) 81 MG EC tablet Take 1 tablet (81 mg total) by mouth daily with breakfast. Swallow whole.  Marland Kitchen atorvastatin (LIPITOR) 80 MG tablet Take 1 tablet (80 mg total) by mouth every evening.  . carvedilol (COREG) 6.25 MG tablet Take 1.5 tablets (9.375 mg total) by mouth 2 (two) times daily.  . collagenase (SANTYL) ointment Apply topically daily. Cleanse left lateral foot/toe wound with NS and pat dry.  Apply Santyl to wound bed.  Cover with NS moist gauze.  Secure with gauze and kerlix/tape.  Change daily  . Exenatide ER (BYDUREON) 2 MG SRER Inject 2 mg into the skin every Thursday.   . furosemide (LASIX) 40 MG tablet Take 1 tablet (40 mg total) by mouth 2 (two) times daily.  Marland Kitchen gabapentin (NEURONTIN)  300 MG capsule Take 300 mg by mouth 3 (three) times daily as needed (for neuropathy pain).   Marland Kitchen LANTUS SOLOSTAR 100 UNIT/ML Solostar Pen INNJECT 50 UNITS S.Q. ONCE DAILY AT 10 P.M. (Patient taking differently: Inject 50 Units into the skin at bedtime. )  . losartan (COZAAR) 25 MG tablet Take 1 tablet (25 mg total) by mouth every evening.  . metFORMIN (GLUCOPHAGE) 500 MG tablet TAKE 1 TABLET BY MOUTH TWICE DAILY WITH MEALS. (Patient taking differently: Take 500 mg by mouth 3 (three) times daily. )  . metoCLOPramide (REGLAN) 10 MG/10ML SOLN Take 10 mLs (10 mg total) by mouth 3 (three) times daily. (Patient taking differently: Take 5 mg  by mouth 3 (three) times daily with meals as needed for nausea. )  . nitroGLYCERIN (NITROSTAT) 0.4 MG SL tablet Place 1 tablet (0.4 mg total) under the tongue every 5 (five) minutes as needed.  Marland Kitchen NOVOLOG FLEXPEN 100 UNIT/ML FlexPen INJECT 12-18 UNITS S.Q. THREE TIMES DAILY WITH MEALS. (Patient taking differently: Inject 12-18 Units into the skin 3 (three) times daily. Per MD sliding scale for blood sugars over 150)  . ondansetron (ZOFRAN ODT) 4 MG disintegrating tablet Take 1 tablet (4 mg total) by mouth every 8 (eight) hours as needed for nausea or vomiting.  Marland Kitchen oxyCODONE-acetaminophen (PERCOCET/ROXICET) 5-325 MG tablet Take 1 tablet by mouth every 4 (four) hours as needed.  . potassium chloride (K-DUR) 10 MEQ tablet Take 1 tablet (10 mEq total) by mouth 2 (two) times daily.  . promethazine (PHENERGAN) 12.5 MG tablet Take 1 tablet (12.5 mg total) by mouth every 6 (six) hours as needed for nausea or vomiting.  . sertraline (ZOLOFT) 100 MG tablet Take 200 mg by mouth at bedtime.   Marland Kitchen spironolactone (ALDACTONE) 25 MG tablet Take 0.5 tablets (12.5 mg total) by mouth daily.  . ticagrelor ([name]) 90 MG TABS tablet Take 1 tablet (90 mg total) by mouth 2 (two) times daily.   No current facility-administered medications for this visit.  (Other)      REVIEW OF SYSTEMS: ROS    Positive for: Endocrine, Eyes   Negative for: Constitutional, Gastrointestinal, Neurological, Skin, Genitourinary, Musculoskeletal, HENT, Cardiovascular, Respiratory, Psychiatric, Allergic/Imm, Heme/Lymph   Last edited by Doneen Poisson on 07/25/2019  1:23 PM. (History)       ALLERGIES Allergies  Allergen Reactions  . Canagliflozin Other (See Comments)    Vaginal yeast infections  . Nsaids Other (See Comments)    Yeast infection     PAST MEDICAL HISTORY Past Medical History:  Diagnosis Date  . Acid reflux   . Anxiety   . Arthritis   . 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 CAD  (coronary artery disease) 11/13/2018   Late presentation anterior MI 12/19 >> LHC - dLM 25, mLAD 99, oOM2 100 (R-L collats), irreg RCA, EF 25-35 >> PCI: POBA to mLAD  . Chronic systolic CHF (congestive heart failure) ([place]) 11/28/2018   Ischemic CM // late presentation ant MI 10/2018 tx with POBA to LAD (residual CAD with CTO of the OM2) // Echo 12/19:  No mural apical thrombus, septal, apical mid ant and inf HK; mild LVH, EF 30-35, mild MR // Echo 01/2019: EF 30-35, Gr 1 DD, diff HK, apical AK, mild MR   . CKD (chronic kidney disease), stage I   . Diabetes mellitus type 1 ([place])   . Former tobacco use   . Gastroparesis   . Hypercholesteremia   .  Hypertension   . Osteomyelitis ([place])    a. s/p R forefoot amputation.  . Renal failure   . Sciatica    Past Surgical History:  Procedure Laterality Date  . AMPUTATION Right 11/03/2011   Procedure: AMPUTATION RAY;  Surgeon: Jamesetta So;  Location: [facility];  Service: General;  Laterality: Right;  Right fourth and fifth metatarsal   . APPENDECTOMY    . CESAREAN SECTION  2004 and 2007   x2  . CORONARY/GRAFT ACUTE MI REVASCULARIZATION N/A 11/11/2018   Procedure: CORONARY/GRAFT ACUTE MI REVASCULARIZATION;  Surgeon: Jettie Booze, MD;  Location: [facility];  Service: Cardiovascular;  Laterality: N/A;  . LEFT HEART CATH AND CORONARY ANGIOGRAPHY N/A 11/11/2018   Procedure: LEFT HEART CATH AND CORONARY ANGIOGRAPHY;  Surgeon: Jettie Booze, MD;  Location: [facility];  Service: Cardiovascular;  Laterality: N/A;  . TUBAL LIGATION      FAMILY HISTORY Family History  Problem Relation Age of Onset  . Diabetes Father   . Lung cancer Father   . Alcoholism Father   . Asthma Mother   . Kidney disease Mother   . Anemia Mother        hemolytic  . Hypertension Mother   . Heart attack Paternal Grandmother   . Diabetes Paternal Grandmother   . Diabetes Paternal Grandfather   . Anesthesia problems Neg Hx   . Hypotension Neg Hx    . Malignant hyperthermia Neg Hx   . Pseudochol deficiency Neg Hx     SOCIAL HISTORY Social History   Tobacco Use  . Smoking status: Former Smoker    Packs/day: 0.25    Years: 20.00    Pack years: 5.00    Types: Cigarettes    Quit date: 12/29/2013    Years since quitting: 5.5  . Smokeless tobacco: Never Used  Substance Use Topics  . Alcohol use: No  . Drug use: No         OPHTHALMIC EXAM:  Base Eye Exam    Visual Acuity (Snellen - Linear)      Right Left   Dist [place] CF @ 4' 20/HM   Dist ph [place] 20/300 +1 NI       Tonometry (Tonopen, 1:39 PM)      Right Left   Pressure 18 14       Pupils      Dark Light Shape React APD   Right 3 2 Round Brisk 0   Left 3 2 Round Brisk 0       Visual Fields      Left Right   Restrictions Total superior temporal, inferior temporal, superior nasal, inferior nasal deficiencies Total inferior nasal deficiency       Extraocular Movement      Right Left    Full Full       Neuro/Psych    Oriented x3: Yes   Mood/Affect: Normal       Dilation    Both eyes: 1.0% Mydriacyl, 2.5% Phenylephrine @ 1:39 PM        Slit Lamp and Fundus Exam    Slit Lamp Exam      Right Left   Lids/Lashes Dermatochalasis - upper lid, Meibomian gland dysfunction Dermatochalasis - upper lid, Meibomian gland dysfunction   Conjunctiva/Sclera White and quiet White and quiet   Cornea 1-2+ inferior Punctate epithelial erosions Clear   Anterior Chamber Deep and quiet Deep and quiet   Iris Round and dilated, No NVI Round and dilated,  No NVI   Lens 1+ Nuclear sclerosis, 1+ Cortical cataract 1+ Nuclear sclerosis, 1+ Cortical cataract   Vitreous Vitreous syneresis, red VH settled inferiorly Vitreous syneresis       Fundus Exam      Right Left   Disc Pink and Sharp, +fibrosis obscured by fibrosis   C/D Ratio 0.5 no view   Macula inferior TRD, scattered IRH Closed wolf-jaw TRD with macular hole   Vessels Vascular attenuation, Tortuous, +fibrotic NVE 4+  fibrotic NVD w/ TRD   Periphery focal TRD at 0100 above disc and also above ST arcades, scattered IRH and exudate, some PRP visible scattered PRP, almost total detachment -- extension of central/posterior TRD        Refraction    Wearing Rx      Sphere Cylinder   Right -6.75 Sphere   Left -5.75 Sphere  Glasses were broken at time of exam with Dr. Tempie Donning.       Manifest Refraction      Sphere Cylinder Dist [facility]   Right -6.00 Sphere 20/50-2   Left -5.75 Sphere 20/HM          IMAGING AND PROCEDURES  Imaging and Procedures for @TODAY @  OCT, Retina - OU - Both Eyes       Right Eye Quality was good. Central Foveal Thickness: 288. Progression has no prior data. Findings include abnormal foveal contour, subretinal fluid, macular pucker, epiretinal membrane, intraretinal hyper-reflective material, intraretinal fluid.   Left Eye Quality was good. Progression has no prior data. Findings include abnormal foveal contour, subretinal fluid, preretinal fibrosis, epiretinal membrane, vitreous traction.   Notes *Images captured and stored on drive  Diagnosis / Impression:  OD: TRD involving inferior macula OS: total macula-involving TRD  Clinical management:  See below  Abbreviations: NFP - Normal foveal profile. CME - cystoid macular edema. PED - pigment epithelial detachment. IRF - intraretinal fluid. SRF - subretinal fluid. EZ - ellipsoid zone. ERM - epiretinal membrane. ORA - outer retinal atrophy. ORT - outer retinal tubulation. SRHM - subretinal hyper-reflective material                 ASSESSMENT/PLAN:    ICD-10-CM   1. 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 Retinal edema  H35.81 OCT, Retina - OU - Both Eyes  3. Essential hypertension  I10   4. Hypertensive retinopathy of both eyes  H35.033   5. Combined forms of age-related cataract  of both eyes  H25.813     1,2. 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    - The need for close monitoring of blood glucose, blood pressure, and serum lipids, avoiding cigarette or any type of tobacco, and the need for long term follow up was also discussed with patient.  - exam shows and OCT confirms TRD OU -- OD with inf macular involvement; OS with closed wolf-jaw TRD with macular hole  - discussed findings and very poor prognosis given chronicity of problems  - interestingly [facility] [place] OD is CF, ph 20/300, but refracts to 20/50?  - discussed eyes need surgery to repair TRDs, but visual  potential limited by chronicity of damage  - pt wishes to observe for now and discuss surgery with family  - f/u in 3-4 wks -- DFE/OCT/FA  3,4. Hypertensive retinopathy OU - discussed importance of tight BP control - monitor  5. 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  - discussed diagnosis and progression  - not yet visually significant  - monitor for now  Ophthalmic Meds Ordered this visit:  No orders of the defined types were placed in this encounter.      Return for f/u 3-4 weeks, PDR OU, DFE, OCT, OPTOS.  There are no Patient Instructions on file for this visit.   Explained the diagnoses, plan, and follow up with the patient and they expressed understanding.  Patient expressed understanding of the importance of proper follow up care.   This document serves as a record of services personally performed by Gardiner Sleeper, MD, PhD. It was created on their behalf by Ernest Mallick, OA, an ophthalmic  assistant. The creation of this record is the provider's dictation and/or activities during the visit.    Electronically signed by: Ernest Mallick, OA  08.31.2020 11:45 PM    Gardiner Sleeper, M.D., Ph.D. Diseases & Surgery of the Retina and Vitreous [facility]  I have reviewed the above documentation for accuracy and completeness, and I agree with the above. Gardiner Sleeper, M.D., Ph.D. 07/25/19 11:45 PM   Abbreviations: M myopia (nearsighted); A astigmatism; H hyperopia (farsighted); P presbyopia; Mrx spectacle prescription;  CTL contact lenses; OD right eye; OS left eye; OU both eyes  XT exotropia; ET esotropia; PEK punctate epithelial keratitis; PEE punctate epithelial erosions; DES dry eye syndrome; MGD meibomian gland dysfunction; ATs artificial tears; PFAT's preservative free artificial tears; [place] nuclear sclerotic cataract; PSC posterior subcapsular cataract; ERM epi-retinal membrane; PVD posterior vitreous detachment; RD retinal detachment; DM diabetes mellitus; DR diabetic retinopathy; NPDR non-proliferative diabetic retinopathy; PDR proliferative diabetic retinopathy; CSME clinically significant macular edema; DME diabetic macular edema; dbh dot blot hemorrhages; CWS cotton wool spot; POAG primary open angle glaucoma; C/D cup-to-disc ratio; HVF humphrey visual field; GVF goldmann visual field; OCT optical coherence tomography; IOP intraocular pressure; BRVO Branch retinal vein occlusion; CRVO central retinal vein occlusion; CRAO central retinal artery occlusion; BRAO branch retinal artery occlusion; RT retinal tear; SB scleral buckle; PPV pars plana vitrectomy; VH Vitreous hemorrhage; PRP panretinal laser photocoagulation; IVK intravitreal kenalog; VMT vitreomacular traction; MH Macular hole;  NVD neovascularization of the disc; NVE neovascularization elsewhere; AREDS age related eye disease study; ARMD age related macular degeneration; POAG primary open angle glaucoma;  EBMD epithelial/anterior basement membrane dystrophy; ACIOL anterior chamber intraocular lens; IOL intraocular lens; PCIOL posterior chamber intraocular lens; Phaco/IOL phacoemulsification with intraocular lens placement; [place] photorefractive keratectomy; LASIK laser assisted in situ keratomileusis; HTN hypertension; DM diabetes mellitus; COPD chronic obstructive pulmonary disease

## 2019-07-25 ENCOUNTER — Ambulatory Visit (INDEPENDENT_AMBULATORY_CARE_PROVIDER_SITE_OTHER): Payer: Medicaid Other | Admitting: Ophthalmology

## 2019-07-25 ENCOUNTER — Other Ambulatory Visit: Payer: Self-pay

## 2019-07-25 ENCOUNTER — Encounter (INDEPENDENT_AMBULATORY_CARE_PROVIDER_SITE_OTHER): Payer: Self-pay | Admitting: Ophthalmology

## 2019-07-25 DIAGNOSIS — H35033 Hypertensive retinopathy, bilateral: Secondary | ICD-10-CM | POA: Diagnosis not present

## 2019-07-25 DIAGNOSIS — H3581 Retinal edema: Secondary | ICD-10-CM

## 2019-07-25 DIAGNOSIS — I10 Essential (primary) hypertension: Secondary | ICD-10-CM

## 2019-07-25 DIAGNOSIS — H25813 Combined forms of age-related cataract, bilateral: Secondary | ICD-10-CM

## 2019-07-25 DIAGNOSIS — E113523 Type 2 diabetes mellitus with proliferative diabetic retinopathy with traction retinal detachment involving the macula, bilateral: Secondary | ICD-10-CM

## 2019-08-10 IMAGING — DX CHEST - 2 VIEW
2 series · 2 of 2 positions shown · non-contrast
Comparison: 01/09/2019

CLINICAL DATA: Chest pain since last night, crushing chest pain
relieved by 2 nitroglycerin tablets, history coronary artery disease
post MI, chronic systolic CHF, type I diabetes mellitus,
hypertension

EXAM:
CHEST - 2 VIEW

[chest pa]
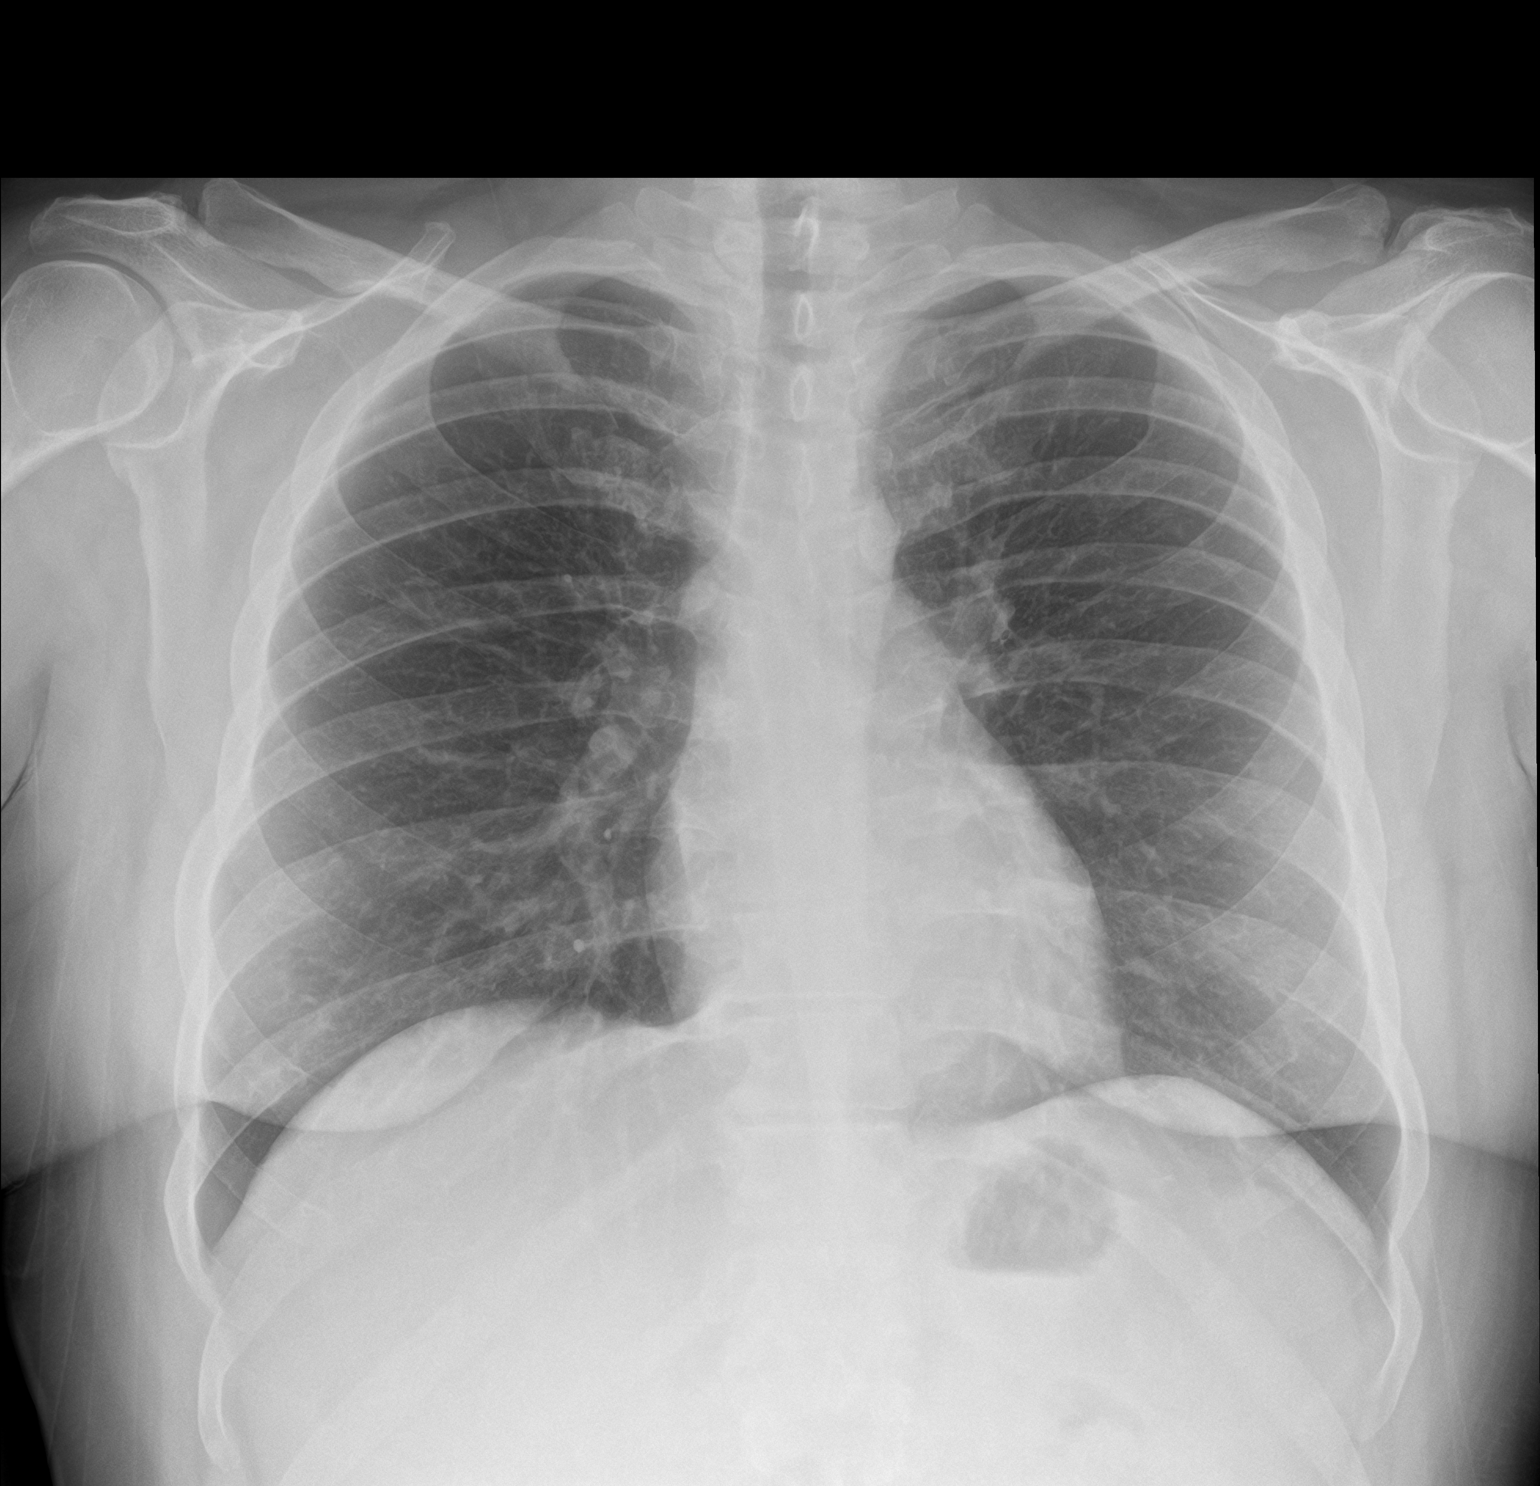

[chest lat]
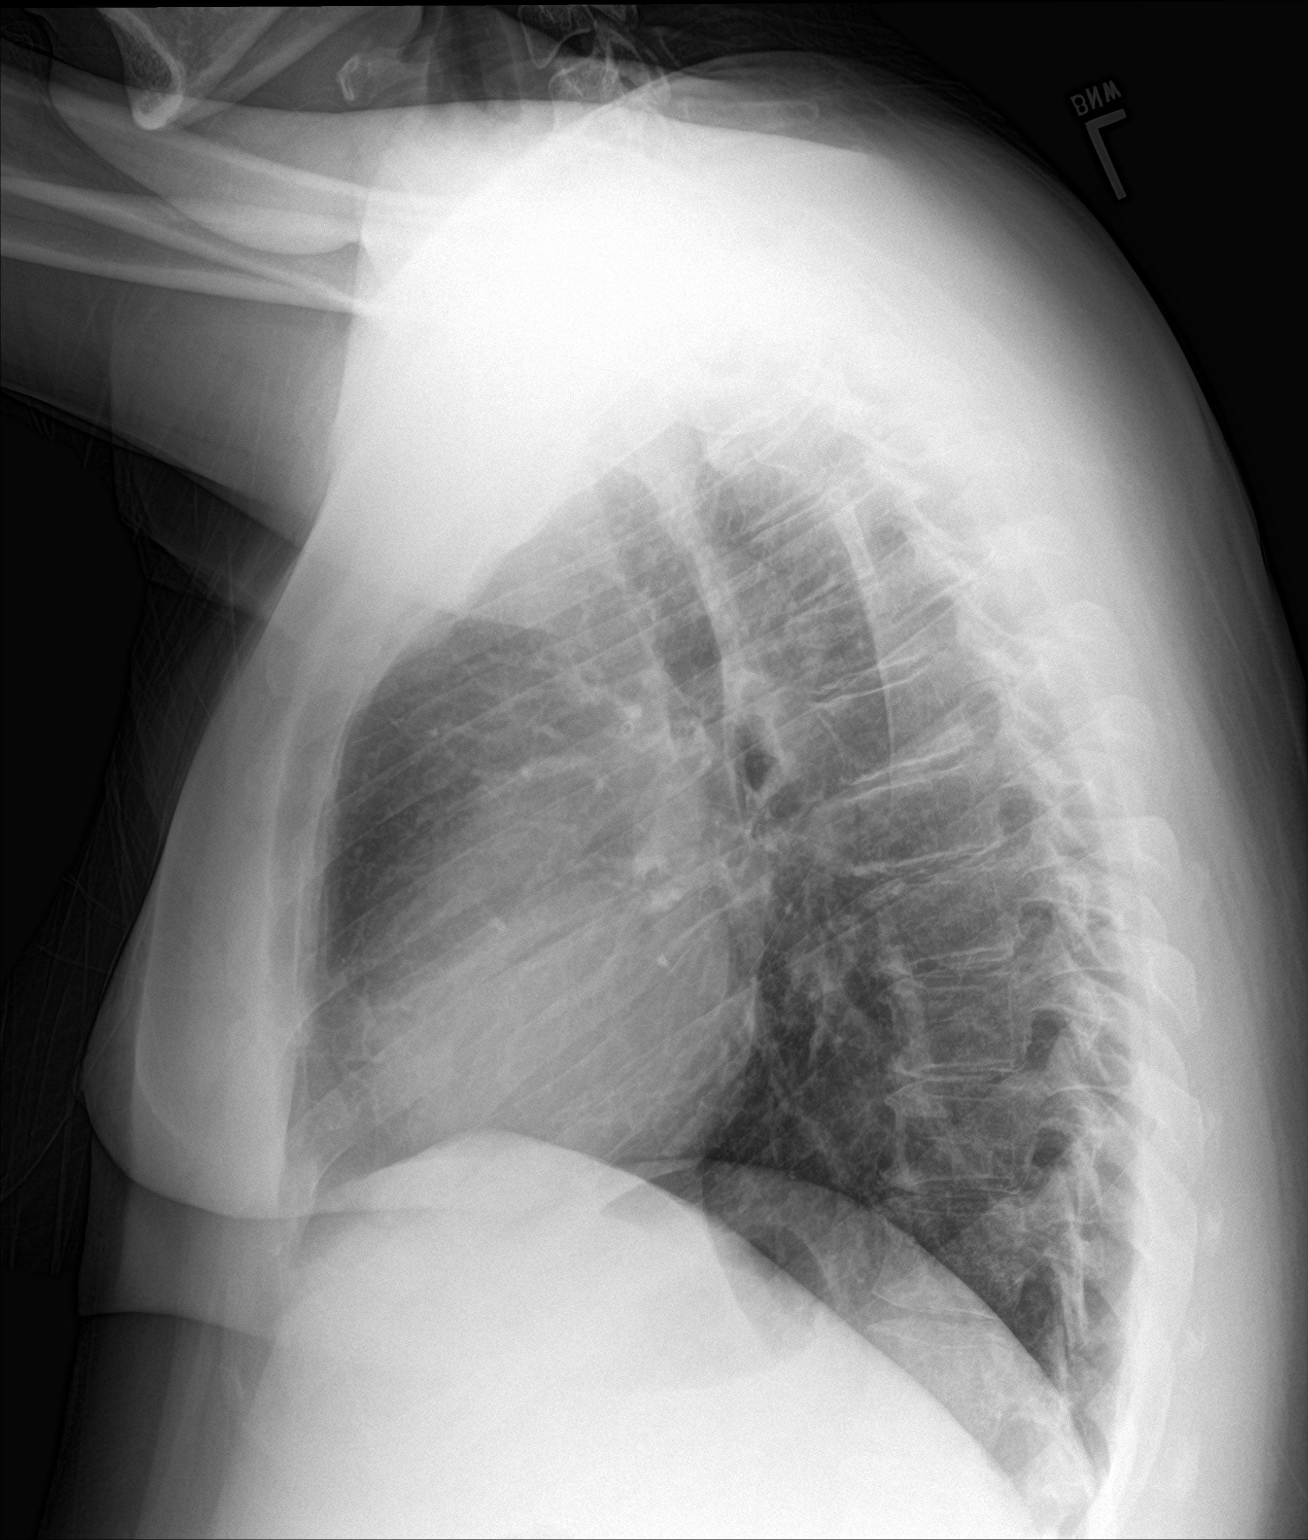

[2 of 2 positions shown; findings below may reference images not displayed]

FINDINGS: Normal heart size, mediastinal contours, and pulmonary vascularity.

Lungs clear.

No infiltrate, pleural effusion or pneumothorax.

Bones unremarkable.
IMPRESSION: No acute abnormalities.

## 2019-08-15 ENCOUNTER — Encounter (INDEPENDENT_AMBULATORY_CARE_PROVIDER_SITE_OTHER): Payer: Medicaid Other | Admitting: Ophthalmology

## 2019-08-20 NOTE — Progress Notes (Signed)
[facility] Note  08/21/2019     CHIEF COMPLAINT Patient presents for Retina Follow Up   HISTORY OF PRESENT ILLNESS: Anne [name] is a 38 y.o. female who presents to the clinic today for:   HPI    Retina Follow Up    Patient presents with  Diabetic Retinopathy.  In both eyes.  Severity is severe.  Duration of 3 weeks.  Since onset it is gradually worsening.  I, the attending physician,  performed the HPI with the patient and updated documentation appropriately.          Comments    3 weeks retina follow up. Patient blood sugar today 139. Patient states vision maybe a little worse.       Last edited by Bernarda Caffey, MD on 08/21/2019  2:30 PM. (History)    pt states she feels like her vision may be a little worse since last visit   Referring physician: Burt Ek, [place],  [place] [address]  HISTORICAL INFORMATION:   Selected notes from the MEDICAL RECORD NUMBER Referred by Dr. Stevenson Clinch for concern of VH / TRD OS LEE: 08.27.20 (E. [place]) [BCVA: OD: "blurry" OS" NLP] Ocular Hx- PMH-DM (a1c: 7.0, takes invokana, lantus, novolog, metformin), HLD, HTN   CURRENT MEDICATIONS: No current outpatient medications on file. (Ophthalmic Drugs)   No current facility-administered medications for this visit.  (Ophthalmic Drugs)   Current Outpatient Medications (Other)  Medication Sig  . aspirin (GNP ASPIRIN LOW DOSE) 81 MG EC tablet Take 1 tablet (81 mg total) by mouth daily with breakfast. Swallow whole.  Marland Kitchen atorvastatin (LIPITOR) 80 MG tablet Take 1 tablet (80 mg total) by mouth every evening.  . carvedilol (COREG) 6.25 MG tablet Take 1.5 tablets (9.375 mg total) by mouth 2 (two) times daily.  . collagenase (SANTYL) ointment Apply topically daily. Cleanse left lateral foot/toe wound with NS and pat dry.  Apply Santyl to wound bed.  Cover with NS moist gauze.  Secure with gauze and kerlix/tape.  Change daily  .  Exenatide ER (BYDUREON) 2 MG SRER Inject 2 mg into the skin every Thursday.   . gabapentin (NEURONTIN) 300 MG capsule Take 300 mg by mouth 3 (three) times daily as needed (for neuropathy pain).   Marland Kitchen LANTUS SOLOSTAR 100 UNIT/ML Solostar Pen INNJECT 50 UNITS S.Q. ONCE DAILY AT 10 P.M. (Patient taking differently: Inject 50 Units into the skin at bedtime. )  . losartan (COZAAR) 25 MG tablet Take 1 tablet (25 mg total) by mouth every evening.  . metFORMIN (GLUCOPHAGE) 500 MG tablet TAKE 1 TABLET BY MOUTH TWICE DAILY WITH MEALS. (Patient taking differently: Take 500 mg by mouth 3 (three) times daily. )  . metoCLOPramide (REGLAN) 10 MG/10ML SOLN Take 10 mLs (10 mg total) by mouth 3 (three) times daily. (Patient taking differently: Take 5 mg by mouth 3 (three) times daily with meals as needed for nausea. )  . nitroGLYCERIN (NITROSTAT) 0.4 MG SL tablet Place 1 tablet (0.4 mg total) under the tongue every 5 (five) minutes as needed.  Marland Kitchen NOVOLOG FLEXPEN 100 UNIT/ML FlexPen INJECT 12-18 UNITS S.Q. THREE TIMES DAILY WITH MEALS. (Patient taking differently: Inject 12-18 Units into the skin 3 (three) times daily. Per MD sliding scale for blood sugars over 150)  . ondansetron (ZOFRAN ODT) 4 MG disintegrating tablet Take 1 tablet (4 mg total) by mouth every 8 (eight) hours as needed for nausea or vomiting.  Marland Kitchen  oxyCODONE-acetaminophen (PERCOCET/ROXICET) 5-325 MG tablet Take 1 tablet by mouth every 4 (four) hours as needed.  . promethazine (PHENERGAN) 12.5 MG tablet Take 1 tablet (12.5 mg total) by mouth every 6 (six) hours as needed for nausea or vomiting.  . sertraline (ZOLOFT) 100 MG tablet Take 200 mg by mouth at bedtime.   Marland Kitchen spironolactone (ALDACTONE) 25 MG tablet Take 0.5 tablets (12.5 mg total) by mouth daily.  . ticagrelor ([name]) 90 MG TABS tablet Take 1 tablet (90 mg total) by mouth 2 (two) times daily.  . furosemide (LASIX) 40 MG tablet Take 1 tablet (40 mg total) by mouth 2 (two) times daily.  . potassium  chloride (K-DUR) 10 MEQ tablet Take 1 tablet (10 mEq total) by mouth 2 (two) times daily.   No current facility-administered medications for this visit.  (Other)      REVIEW OF SYSTEMS: ROS    Positive for: Endocrine, Eyes   Negative for: Constitutional, Gastrointestinal, Neurological, Skin, Genitourinary, Musculoskeletal, HENT, Cardiovascular, Respiratory, Psychiatric, Allergic/Imm, Heme/Lymph   Last edited by Elmore Guise, COT on 08/21/2019  1:28 PM. (History)       ALLERGIES Allergies  Allergen Reactions  . Canagliflozin Other (See Comments)    Vaginal yeast infections  . Nsaids Other (See Comments)    Yeast infection     PAST MEDICAL HISTORY Past Medical History:  Diagnosis Date  . Acid reflux   . Anxiety   . Arthritis   . 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 CAD (coronary artery disease) 11/13/2018   Late presentation anterior MI 12/19 >> LHC - dLM 25, mLAD 99, oOM2 100 (R-L collats), irreg RCA, EF 25-35 >> PCI: POBA to mLAD  . Chronic systolic CHF (congestive heart failure) ([place]) 11/28/2018   Ischemic CM // late presentation ant MI 10/2018 tx with POBA to LAD (residual CAD with CTO of the OM2) // Echo 12/19:  No mural apical thrombus, septal, apical mid ant and inf HK; mild LVH, EF 30-35, mild MR // Echo 01/2019: EF 30-35, Gr 1 DD, diff HK, apical AK, mild MR   . CKD (chronic kidney disease), stage I   . Diabetes mellitus type 1 ([place])   . Former tobacco use   . Gastroparesis   . Hypercholesteremia   . Hypertension   . Osteomyelitis ([place])    a. s/p R forefoot amputation.  . Renal failure   . Sciatica    Past Surgical History:  Procedure Laterality Date  . AMPUTATION Right 11/03/2011   Procedure: AMPUTATION RAY;  Surgeon: Jamesetta So;  Location: [facility];  Service: General;  Laterality: Right;  Right fourth and fifth metatarsal   . APPENDECTOMY    . CESAREAN SECTION  2004 and 2007   x2  . CORONARY/GRAFT ACUTE MI  REVASCULARIZATION N/A 11/11/2018   Procedure: CORONARY/GRAFT ACUTE MI REVASCULARIZATION;  Surgeon: Jettie Booze, MD;  Location: [facility];  Service: Cardiovascular;  Laterality: N/A;  . LEFT HEART CATH AND CORONARY ANGIOGRAPHY N/A 11/11/2018   Procedure: LEFT HEART CATH AND CORONARY ANGIOGRAPHY;  Surgeon: Jettie Booze, MD;  Location: [facility];  Service: Cardiovascular;  Laterality: N/A;  . TUBAL LIGATION      FAMILY HISTORY Family History  Problem Relation Age of Onset  . Diabetes Father   . Lung cancer Father   . Alcoholism Father   . Asthma Mother   . Kidney disease Mother   .  Anemia Mother        hemolytic  . Hypertension Mother   . Heart attack Paternal Grandmother   . Diabetes Paternal Grandmother   . Diabetes Paternal Grandfather   . Anesthesia problems Neg Hx   . Hypotension Neg Hx   . Malignant hyperthermia Neg Hx   . Pseudochol deficiency Neg Hx     SOCIAL HISTORY Social History   Tobacco Use  . Smoking status: Former Smoker    Packs/day: 0.25    Years: 20.00    Pack years: 5.00    Types: Cigarettes    Quit date: 12/29/2013    Years since quitting: 5.6  . Smokeless tobacco: Never Used  Substance Use Topics  . Alcohol use: No  . Drug use: No         OPHTHALMIC EXAM:  Base Eye Exam    Visual Acuity (Snellen - Linear)      Right Left   Dist [place] CF at 3' 20/HM   Dist ph [place] 20/NI        Tonometry (Tonopen, 1:30 PM)      Right Left   Pressure 13 11       Pupils      Dark Light Shape React APD   Right 3 2 Round Brisk None   Left 3 2 Round Brisk None       Visual Fields (Counting fingers)      Left Right   Restrictions Total superior temporal, inferior temporal, superior nasal, inferior nasal deficiencies Total inferior nasal deficiency       Extraocular Movement      Right Left    Full, Ortho Full, Ortho       Neuro/Psych    Oriented x3: Yes   Mood/Affect: Normal       Dilation    Both eyes: 1.0%  Mydriacyl, 2.5% Phenylephrine @ 1:30 PM        Slit Lamp and Fundus Exam    Slit Lamp Exam      Right Left   Lids/Lashes Dermatochalasis - upper lid, Meibomian gland dysfunction Dermatochalasis - upper lid, Meibomian gland dysfunction   Conjunctiva/Sclera White and quiet White and quiet   Cornea 1-2+ inferior Punctate epithelial erosions Clear   Anterior Chamber Deep and quiet Deep and quiet   Iris Round and dilated, No NVI Round and dilated, No NVI   Lens 1+ Nuclear sclerosis, 1+ Cortical cataract 1+ Nuclear sclerosis, 1+ Cortical cataract   Vitreous Vitreous syneresis, +VH greatest inferiorly Vitreous syneresis       Fundus Exam      Right Left   Disc Pink and Sharp, +fibrosis obscured by fibrosis   C/D Ratio 0.5 no view   Macula inferior TRD, scattered IRH, pre-retinal fibrosis Closed wolf-jaw TRD with macular hole   Vessels Vascular attenuation, Tortuous, +fibrotic NVE 4+ fibrotic NVD w/ TRD   Periphery focal TRD at 0100 above disc and also above ST arcades, scattered IRH and exudate, good 360 PRP scattered PRP, almost total detachment -- extension of central/posterior TRD          IMAGING AND PROCEDURES  Imaging and Procedures for @TODAY @  OCT, Retina - OU - Both Eyes       Right Eye Quality was good. Central Foveal Thickness: 292. Progression has worsened. Findings include abnormal foveal contour, subretinal fluid, macular pucker, epiretinal membrane, intraretinal hyper-reflective material, intraretinal fluid (Mild interval progression of TRD inferior macula).   Left Eye Quality was good. Progression has been  stable. Findings include abnormal foveal contour, subretinal fluid, preretinal fibrosis, epiretinal membrane, vitreous traction.   Notes *Images captured and stored on drive  Diagnosis / Impression:  OD: TRD involving inferior macula -- Mild interval progression of TRD inferior macula OS: total macula-involving TRD  Clinical management:  See  below  Abbreviations: NFP - Normal foveal profile. CME - cystoid macular edema. PED - pigment epithelial detachment. IRF - intraretinal fluid. SRF - subretinal fluid. EZ - ellipsoid zone. ERM - epiretinal membrane. ORA - outer retinal atrophy. ORT - outer retinal tubulation. SRHM - subretinal hyper-reflective material        Fluorescein Angiography Optos (Transit OD)       Right Eye   Progression has no prior data. Early phase findings include delayed filling, microaneurysm, staining, leakage, vascular perfusion defect, retinal neovascularization. Mid/Late phase findings include staining, leakage, microaneurysm, retinal neovascularization, vascular perfusion defect.   Left Eye   Progression has no prior data. Early phase findings include microaneurysm, retinal neovascularization, neovascularization disc, staining, vascular perfusion defect. Mid/Late phase findings include staining, neovascularization disc, microaneurysm, retinal neovascularization, vascular perfusion defect.   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 Retinal edema  H35.81 OCT, Retina - OU - Both Eyes  3. Essential hypertension  I10   4. Hypertensive retinopathy of both eyes  H35.033 Fluorescein Angiography Optos (Transit OD)  5. Combined forms of age-related cataract of both eyes  H25.813     1,2. 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    - The need for close monitoring of blood glucose, blood pressure, and serum lipids, avoiding cigarette or any type of tobacco, and the need for long term follow up was also discussed with patient.  - exam shows and OCT confirms TRD OU -- OD with inf macula and foveal involvement; OS with closed wolf-jaw total TRD with macular hole  - discussed findings and very poor prognosis given chronicity of problems  - BCVA OD CF, OS HM  - discussed eyes need surgery to repair TRDs, but visual potential limited by chronicity of damage  - recommend PPV w/ membrane peel, endolaser and silicon oil OD under general anesthesia  - RBA of procedure discussed, questions answered  - specifically discussed possibility of further vision loss even with surgery -- pt verbalized understanding and wishes to proceed with surgery  - informed consent obtained and signed  - will schedule surgery for Oct 22 -- [facility]  - will need medical clearance from PCP, cardiology and anesthesia teams  - will need pre op COVID testing and pre op IVA OD on 10/16  - f/u October 16 -- DFE/OCT/pre op IVA OD  3,4. Hypertensive retinopathy OU  - discussed importance of tight BP control  - monitor  5. Mixed form age related cataract OU  - The symptoms of cataract, surgical options, and treatments  and risks were discussed with patient.  - discussed diagnosis and progression  - not yet visually significant  - monitor for now  Ophthalmic Meds Ordered this visit:  No orders of the defined types were placed in this encounter.      Return in about 18 days (around 09/08/2019).  There are no Patient Instructions on file for this visit.   Explained the diagnoses, plan, and follow up with the patient and they expressed understanding.  Patient expressed understanding of the importance of proper follow up care.   This document serves as a record of services personally performed by Gardiner Sleeper, MD, PhD. It was created on their behalf by Ernest Mallick, OA, an ophthalmic assistant. The creation of this record is the provider's dictation and/or activities during the visit.    Electronically signed by: Ernest Mallick, OA  09.27.2020 4:35 PM   Gardiner Sleeper, M.D., Ph.D. Diseases & Surgery of the Retina and Vitreous [facility]  I have reviewed the above documentation for accuracy and completeness, and I agree with the above. Gardiner Sleeper, M.D., Ph.D. 08/21/19 4:41 PM    Abbreviations: M myopia (nearsighted); A astigmatism; H hyperopia (farsighted); P presbyopia; Mrx spectacle prescription;  CTL contact lenses; OD right eye; OS left eye; OU both eyes  XT exotropia; ET esotropia; PEK punctate epithelial keratitis; PEE punctate epithelial erosions; DES dry eye syndrome; MGD meibomian gland dysfunction; ATs artificial tears; PFAT's preservative free artificial tears; [place] nuclear sclerotic cataract; PSC posterior subcapsular cataract; ERM epi-retinal membrane; PVD posterior vitreous detachment; RD retinal detachment; DM diabetes mellitus; DR diabetic retinopathy; NPDR non-proliferative diabetic retinopathy; PDR proliferative diabetic retinopathy; CSME clinically significant macular edema; DME diabetic macular edema; dbh dot blot hemorrhages; CWS cotton wool spot; POAG primary open angle glaucoma; C/D cup-to-disc ratio; HVF humphrey visual field; GVF goldmann visual field; OCT optical coherence tomography; IOP intraocular pressure; BRVO Branch retinal vein occlusion; CRVO central retinal vein occlusion; CRAO central retinal artery occlusion; BRAO branch retinal artery occlusion; RT retinal tear; SB scleral buckle; PPV pars plana vitrectomy; VH Vitreous hemorrhage; PRP panretinal laser photocoagulation; IVK intravitreal kenalog; VMT vitreomacular traction; MH Macular hole;  NVD neovascularization of the disc; NVE neovascularization elsewhere; AREDS age related eye disease  study; ARMD age related macular degeneration; POAG primary open angle glaucoma; EBMD epithelial/anterior basement membrane dystrophy; ACIOL anterior chamber intraocular lens; IOL intraocular lens; PCIOL posterior chamber intraocular lens; Phaco/IOL phacoemulsification with intraocular lens placement; [place] photorefractive keratectomy; LASIK laser assisted in situ keratomileusis; HTN hypertension; DM diabetes mellitus; COPD chronic obstructive pulmonary disease

## 2019-08-21 ENCOUNTER — Ambulatory Visit (INDEPENDENT_AMBULATORY_CARE_PROVIDER_SITE_OTHER): Payer: Medicaid Other | Admitting: Ophthalmology

## 2019-08-21 ENCOUNTER — Other Ambulatory Visit: Payer: Self-pay

## 2019-08-21 ENCOUNTER — Encounter (INDEPENDENT_AMBULATORY_CARE_PROVIDER_SITE_OTHER): Payer: Self-pay | Admitting: Ophthalmology

## 2019-08-21 DIAGNOSIS — E113523 Type 2 diabetes mellitus with proliferative diabetic retinopathy with traction retinal detachment involving the macula, bilateral: Secondary | ICD-10-CM | POA: Diagnosis not present

## 2019-08-21 DIAGNOSIS — H35033 Hypertensive retinopathy, bilateral: Secondary | ICD-10-CM | POA: Diagnosis not present

## 2019-08-21 DIAGNOSIS — H25813 Combined forms of age-related cataract, bilateral: Secondary | ICD-10-CM

## 2019-08-21 DIAGNOSIS — H3581 Retinal edema: Secondary | ICD-10-CM

## 2019-08-21 DIAGNOSIS — I10 Essential (primary) hypertension: Secondary | ICD-10-CM | POA: Diagnosis not present

## 2019-08-24 ENCOUNTER — Telehealth: Payer: Self-pay

## 2019-08-24 NOTE — Telephone Encounter (Signed)
Dr. Harrington Challenger, [place] are listed as this patient's primary cardiologist. She has a history of late presenting MI 10/2018 with cath showing 100% 2nd Mrg occlusion with R--> L collaterals, 25% distal LM, and 99% mid-LAD treated with POBA as vessel felt to be too small for stenting or CABG.  She has an ICD in place.  We are asked for guidance on holding brilinta for surgical procedure. She is not yet one year from her intervention, but no stent was placed. Can you please provide recommendations?

## 2019-08-24 NOTE — Telephone Encounter (Signed)
   [facility] Pre-operative Risk Assessment    Request for surgical clearance:  1. What type of surgery is being performed? PPV for tractional detachment   2. When is this surgery scheduled? 09/14/19   3. What type of clearance is required (medical clearance vs. Pharmacy clearance to hold med vs. Both)? Both  4. Are there any medications that need to be held prior to surgery and how long? [name]   5. Practice name and name of physician performing surgery? [facility]/Dr. Bernarda Caffey   6. What is your office phone number [phone]   7.   What is your office fax number [phone]  8.   Anesthesia type (None, local, MAC, general) ? General   Anne [name] 08/24/2019, 12:24 PM  _________________________________________________________________   (provider comments below)

## 2019-08-25 NOTE — Telephone Encounter (Signed)
OK to hold brilinta prior to surgery    NO stent placed   REsume after when OK from surgical standpoint

## 2019-08-28 NOTE — Telephone Encounter (Signed)
Will route to pre op clearance pool. Pt is scheduled for follow up with Dr. Harrington Challenger 09/01/19.

## 2019-08-28 NOTE — Telephone Encounter (Signed)
   Primary Cardiologist: Dorris Carnes, MD  Chart reviewed as part of pre-operative protocol coverage. Given past medical history and time since last visit, based on ACC/AHA guidelines, Anne [name] would be at acceptable risk for the planned procedure without further cardiovascular testing.   Per Dr. Harrington Challenger, it will be acceptable to hold [name] prior to surgery as no stent was placed. Please resume  after when ok from surgical standpoint.   I will route this recommendation to the requesting party via Epic fax function and remove from pre-op pool.  Please call with questions.  Kathyrn Drown, NP 08/28/2019, 8:47 AM

## 2019-08-31 NOTE — Progress Notes (Signed)
Cardiology Office Note   Date:  09/01/2019   ID:  Anne [name], DOB [birth year], MRN [id number]  PCP:  Alfonse Flavors, MD  Cardiologist:   Dorris Carnes, MD    Pt presetns for f/u of CAD and CHF     History of Present Illness: Anne [name] is a 38 y.o. 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 She was admitted in 10/2018 with a late presentation anterior MI c/b post MI pericarditis. Cardiac Cath demonstrated diffuse dz in the LAD and LCx.  She underwent POBA to the LAD b/c it was not large enough for stenting or CABG. EF was 30-35. She was last seen in 11/2018 for follow up.  I changed her Entresto back to Losartan due to low BP.    She was seen by Kathleen Argue in clinic back in march.  She as alos seen by Beckie Salts in EP clinic   Felt to have Class II symtpoms   Plan to follow     She comees in today nauseated   This is common for her    Worse now that vision is worse   Has gastroparesis  Deneis CP   Breathing is steady    To have eye surgery soon     Current Meds  Medication Sig  . aspirin (GNP ASPIRIN LOW DOSE) 81 MG EC tablet Take 1 tablet (81 mg total) by mouth daily with breakfast. Swallow whole.  Marland Kitchen atorvastatin (LIPITOR) 80 MG tablet Take 1 tablet (80 mg total) by mouth every evening.  . carvedilol (COREG) 6.25 MG tablet Take 1.5 tablets (9.375 mg total) by mouth 2 (two) times daily.  . furosemide (LASIX) 40 MG tablet Take 1 tablet (40 mg total) by mouth 2 (two) times daily.  Marland Kitchen gabapentin (NEURONTIN) 300 MG capsule Take 300 mg by mouth 3 (three) times daily as needed (for neuropathy pain).   Marland Kitchen LANTUS SOLOSTAR 100 UNIT/ML Solostar Pen INNJECT 50 UNITS S.Q. ONCE DAILY AT 10 P.M. (Patient taking differently: Inject 50 Units into the skin at bedtime. )  . losartan (COZAAR) 25 MG tablet Take 1 tablet (25 mg  total) by mouth every evening.  . metFORMIN (GLUCOPHAGE) 500 MG tablet TAKE 1 TABLET BY MOUTH TWICE DAILY WITH MEALS. (Patient taking differently: Take 500 mg by mouth 3 (three) times daily. )  . metoCLOPramide (REGLAN) 10 MG/10ML SOLN Take 10 mLs (10 mg total) by mouth 3 (three) times daily. (Patient taking differently: Take 5 mg by mouth 3 (three) times daily with meals as needed for nausea. )  . nitroGLYCERIN (NITROSTAT) 0.4 MG SL tablet Place 1 tablet (0.4 mg total) under the tongue every 5 (five) minutes as needed.  Marland Kitchen NOVOLOG FLEXPEN 100 UNIT/ML FlexPen INJECT 12-18 UNITS S.Q. THREE TIMES DAILY WITH MEALS. (Patient taking differently: Inject 12-18 Units into the skin 3 (three) times daily. Per MD sliding scale for blood sugars over 150)  . ondansetron (ZOFRAN ODT) 4 MG disintegrating tablet Take 1 tablet (4 mg total) by mouth every 8 (eight) hours as needed for nausea or vomiting.  Marland Kitchen oxyCODONE-acetaminophen (PERCOCET/ROXICET) 5-325 MG tablet Take 1 tablet by mouth every 4 (four) hours as needed.  . potassium chloride (K-DUR) 10 MEQ tablet Take 1 tablet (10 mEq total) by mouth 2 (two) times daily.  . promethazine (PHENERGAN) 12.5 MG tablet Take 1 tablet (12.5 mg total) by mouth every 6 (  six) hours as needed for nausea or vomiting.  . sertraline (ZOLOFT) 100 MG tablet Take 200 mg by mouth at bedtime.   Marland Kitchen spironolactone (ALDACTONE) 25 MG tablet Take 0.5 tablets (12.5 mg total) by mouth daily.  . ticagrelor ([name]) 90 MG TABS tablet Take 1 tablet (90 mg total) by mouth 2 (two) times daily.     Allergies:   Canagliflozin and Nsaids   Past Medical History:  Diagnosis Date  . Acid reflux   . Anxiety   . Arthritis   . 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 CAD (coronary artery disease) 11/13/2018   Late presentation anterior MI 12/19 >> LHC - dLM 25, mLAD 99, oOM2 100 (R-L collats), irreg RCA, EF 25-35 >> PCI: POBA to mLAD  . Chronic systolic CHF (congestive  heart failure) ([place]) 11/28/2018   Ischemic CM // late presentation ant MI 10/2018 tx with POBA to LAD (residual CAD with CTO of the OM2) // Echo 12/19:  No mural apical thrombus, septal, apical mid ant and inf HK; mild LVH, EF 30-35, mild MR // Echo 01/2019: EF 30-35, Gr 1 DD, diff HK, apical AK, mild MR   . CKD (chronic kidney disease), stage I   . Diabetes mellitus type 1 ([place])   . Former tobacco use   . Gastroparesis   . Hypercholesteremia   . Hypertension   . Osteomyelitis ([place])    a. s/p R forefoot amputation.  . Renal failure   . Sciatica     Past Surgical History:  Procedure Laterality Date  . AMPUTATION Right 11/03/2011   Procedure: AMPUTATION RAY;  Surgeon: Jamesetta So;  Location: [facility];  Service: General;  Laterality: Right;  Right fourth and fifth metatarsal   . APPENDECTOMY    . CESAREAN SECTION  2004 and 2007   x2  . CORONARY/GRAFT ACUTE MI REVASCULARIZATION N/A 11/11/2018   Procedure: CORONARY/GRAFT ACUTE MI REVASCULARIZATION;  Surgeon: Jettie Booze, MD;  Location: [facility];  Service: Cardiovascular;  Laterality: N/A;  . LEFT HEART CATH AND CORONARY ANGIOGRAPHY N/A 11/11/2018   Procedure: LEFT HEART CATH AND CORONARY ANGIOGRAPHY;  Surgeon: Jettie Booze, MD;  Location: [facility];  Service: Cardiovascular;  Laterality: N/A;  . TUBAL LIGATION       Social History:  The patient  reports that she quit smoking about 5 years ago. Her smoking use included cigarettes. She has a 5.00 pack-year smoking history. She has never used smokeless tobacco. She reports that she does not drink alcohol or use drugs.   Family History:  The patient's family history includes Alcoholism in her father; Anemia in her mother; Asthma in her mother; Diabetes in her father, paternal grandfather, and paternal grandmother; Heart attack in her paternal grandmother; Hypertension in her mother; Kidney disease in her mother; Lung cancer in her father.    ROS:  Please  see the history of present illness. All other systems are reviewed and  Negative to the above problem except as noted.    PHYSICAL EXAM: VS:  BP 118/68   Pulse 72   Ht 5\' 10"  (1.778 m)   Wt 184 lb 1.9 oz (83.5 kg)   SpO2 99%   BMI 26.42 kg/m   GEN: Overweight 38 yo in no acute distress  HEENT: normal  Neck: no JVD, carotid bruits, or masses Cardiac: RRR; no murmurs, rubs, or gallops,no edema  Respiratory:  clear to auscultation bilaterally,  normal work of breathing GI: soft, nontender, nondistended, + BS  No hepatomegaly  MS: no deformity Moving all extremities   Skin: warm and dry, no rash Neuro:  Strength and sensation are intact Psych: euthymic mood, full affect   EKG:  EKG is ordered today.  Cardiac studies  Echo 11/13/18 Study Conclusions  - Left ventricle: No mural apical thrombus with definity. septal apical mid anterior and inferior hypokinesis preserved basal function The cavity size was moderately dilated. Wall thickness was increased in a pattern of mild LVH. Systolic function was moderately to severely reduced. The estimated ejection fraction was in the range of 30% to 35%. - Mitral valve: There was mild regurgitation. - Atrial septum: No defect or patent foramen ovale was identified.  LEFT HEART CATH AND CORONARY ANGIOGRAPHY  CORONARY/GRAFT ACUTE MI REVASCULARIZATION12/20/19   Conclusion   Ost 2nd Mrg to 2nd Mrg lesion is 100% stenosed. Likely chronic occlusion with right to left collaterals.  Mid LAD lesion is 99% stenosed. PTCA with 2.0 mm balloon performed.  Post intervention, there is a 25% residual stenosis.  Dist LM lesion is 25% stenosed.  There is severe left ventricular systolic dysfunction.  The left ventricular ejection fraction is 25-35% by visual estimate.  LV end diastolic pressure is mildly elevated.  There is no aortic valve stenosis.      Lipid Panel    Component Value Date/Time   CHOL 116 01/16/2019 1347    TRIG 179 (H) 01/16/2019 1347   HDL 27 (L) 01/16/2019 1347   CHOLHDL 4.3 01/16/2019 1347   CHOLHDL 6.8 11/11/2018 1304   VLDL 33 11/11/2018 1304   LDLCALC 53 01/16/2019 1347   LDLDIRECT 98 02/10/2012 1230      Wt Readings from Last 3 Encounters:  09/01/19 184 lb 1.9 oz (83.5 kg)  07/17/19 185 lb (83.9 kg)  06/08/19 193 lb 12.8 oz (87.9 kg)      ASSESSMENT AND PLAN:  1  CAD   No symptoms of angina      2  CHF  Echo LVEF 30 to 35%   VOlume status is OK   Keep on current regimen  Check labs today   3  HL   WIll check lipids    4  HTN   BP is OK on current regimen    Current medicines are reviewed at length with the patient today.  The patient does not have concerns regarding medicines.  Signed, Dorris Carnes, MD  09/01/2019 11:29 AM    [facility] [address], [place], [place]  [address] Phone: [phone]; Fax: [phone]

## 2019-09-01 ENCOUNTER — Encounter: Payer: Self-pay | Admitting: Internal Medicine

## 2019-09-01 ENCOUNTER — Ambulatory Visit: Payer: Medicaid Other | Admitting: Internal Medicine

## 2019-09-01 ENCOUNTER — Other Ambulatory Visit: Payer: Self-pay

## 2019-09-01 VITALS — BP 118/68 | HR 72 | Ht 70.0 in | Wt 184.1 lb

## 2019-09-01 DIAGNOSIS — I2510 Atherosclerotic heart disease of native coronary artery without angina pectoris: Secondary | ICD-10-CM | POA: Diagnosis not present

## 2019-09-01 DIAGNOSIS — I5022 Chronic systolic (congestive) heart failure: Secondary | ICD-10-CM | POA: Diagnosis not present

## 2019-09-01 MED ORDER — ISOSORBIDE MONONITRATE ER 30 MG PO TB24: 30.0000 mg | ORAL_TABLET | Freq: Every day | ORAL | 3 refills | Status: DC

## 2019-09-01 NOTE — Patient Instructions (Addendum)
Medication Instructions:  Your physician has recommended you make the following change in your medication:  1.) start isosorbide (IMDUR) 30 mg by mouth once a day for chest discomfort  If you need a refill on your cardiac medications before your next appointment, please call your pharmacy.   Lab work: Today: bmet, cbc, lipids, bnp If you have labs (blood work) drawn today and your tests are completely normal, you will receive your results only by: Marland Kitchen MyChart Message (if you have MyChart) OR . A paper copy in the mail If you have any lab test that is abnormal or we need to change your treatment, we will call you to review the results.  Testing/Procedures: none  Follow-Up: At [facility], you and your health needs are our priority.  As part of our continuing mission to provide you with exceptional heart care, we have created designated Provider Care Teams.  These Care Teams include your primary Cardiologist (physician) and Advanced Practice Providers (APPs -  Physician Assistants and Nurse Practitioners) who all work together to provide you with the care you need, when you need it. You will need a follow up appointment in:  January 2021 - 4 months.  Please call our office 2 months in advance to schedule this appointment.  You may see Dorris Carnes, MD or one of the following Advanced Practice Providers on your designated Care Team: Richardson Dopp, PA-C [place], [place] . Daune Perch, NP  Any Other Special Instructions Will Be Listed Below (If Applicable).

## 2019-09-02 LAB — LIPID PANEL
Chol/HDL Ratio: 7.3 ratio — ABNORMAL HIGH (ref 0.0–4.4)
Cholesterol, Total: 189 mg/dL (ref 100–199)
HDL: 26 mg/dL — ABNORMAL LOW (ref >39)
LDL Chol Calc (NIH): 76 mg/dL (ref 0–99)
Triglycerides: 546 mg/dL — ABNORMAL HIGH (ref 0–149)
VLDL Cholesterol Cal: 87 mg/dL — ABNORMAL HIGH (ref 5–40)

## 2019-09-02 LAB — BASIC METABOLIC PANEL
BUN/Creatinine Ratio: 25 — ABNORMAL HIGH (ref 9–23)
BUN: 29 mg/dL — ABNORMAL HIGH (ref 6–20)
CO2: 25 mmol/L (ref 20–29)
Calcium: 9.6 mg/dL (ref 8.7–10.2)
Chloride: 96 mmol/L (ref 96–106)
Creatinine, Ser: 1.18 mg/dL — ABNORMAL HIGH (ref 0.57–1.00)
GFR calc Af Amer: 67 mL/min/{1.73_m2} (ref >59)
GFR calc non Af Amer: 58 mL/min/{1.73_m2} — ABNORMAL LOW (ref >59)
Glucose: 334 mg/dL — ABNORMAL HIGH (ref 65–99)
Potassium: 4.7 mmol/L (ref 3.5–5.2)
Sodium: 138 mmol/L (ref 134–144)

## 2019-09-02 LAB — CBC
Hematocrit: 36.8 % (ref 34.0–46.6)
Hemoglobin: 12.4 g/dL (ref 11.1–15.9)
MCH: 30.2 pg (ref 26.6–33.0)
MCHC: 33.7 g/dL (ref 31.5–35.7)
MCV: 90 fL (ref 79–97)
Platelets: 266 10*3/uL (ref 150–450)
RBC: 4.1 x10E6/uL (ref 3.77–5.28)
RDW: 13.9 % (ref 11.7–15.4)
WBC: 9.2 10*3/uL (ref 3.4–10.8)

## 2019-09-02 LAB — TSH: TSH: 1.58 u[IU]/mL (ref 0.450–4.500)

## 2019-09-02 LAB — PRO B NATRIURETIC PEPTIDE: NT-Pro BNP: 313 pg/mL — ABNORMAL HIGH (ref 0–130)

## 2019-09-05 NOTE — Progress Notes (Signed)
[facility] Note  09/08/2019     CHIEF COMPLAINT Patient presents for Retina Follow Up   HISTORY OF PRESENT ILLNESS: Anne [name] is a 38 y.o. female who presents to the clinic today for:   HPI    Retina Follow Up    Patient presents with  Diabetic Retinopathy.  In both eyes.  Severity is severe.  Duration of 2.5 weeks.  Since onset it is stable.  I, the attending physician,  performed the HPI with the patient and updated documentation appropriately.          Comments    Patient states vision still blurred OU. No worse than before. BS was 189 this am. Last a1c was 7.0, checked several months ago. Re-check a1c in a couple of weeks.       Last edited by Bernarda Caffey, MD on 09/08/2019  4:21 PM. (History)    pts mother in law is with her today, pt states she is very nervous and worried about having surgery next week   Referring physician: Burt Ek, [place],  [place] [id number]  HISTORICAL INFORMATION:   Selected notes from the MEDICAL RECORD NUMBER Referred by Dr. Stevenson Clinch for concern of VH / TRD OS LEE: 08.27.20 (E. [place]) [BCVA: OD: "blurry" OS" NLP] Ocular Hx- PMH-DM (a1c: 7.0, takes invokana, lantus, novolog, metformin), HLD, HTN   CURRENT MEDICATIONS: No current outpatient medications on file. (Ophthalmic Drugs)   No current facility-administered medications for this visit.  (Ophthalmic Drugs)   Current Outpatient Medications (Other)  Medication Sig  . albuterol (VENTOLIN HFA) 108 (90 Base) MCG/ACT inhaler Inhale 2 puffs into the lungs every 6 (six) hours as needed for wheezing or shortness of breath.  Marland Kitchen aspirin (GNP ASPIRIN LOW DOSE) 81 MG EC tablet Take 1 tablet (81 mg total) by mouth daily with breakfast. Swallow whole. (Patient taking differently: Take 81 mg by mouth daily with breakfast. )  . atorvastatin (LIPITOR) 80 MG tablet Take 1 tablet (80 mg total) by mouth every evening.  . carvedilol  (COREG) 6.25 MG tablet Take 1.5 tablets (9.375 mg total) by mouth 2 (two) times daily.  Marland Kitchen gabapentin (NEURONTIN) 300 MG capsule Take 300 mg by mouth 3 (three) times daily as needed (for neuropathy pain).   . isosorbide mononitrate (IMDUR) 30 MG 24 hr tablet Take 1 tablet (30 mg total) by mouth daily.  Marland Kitchen LANTUS SOLOSTAR 100 UNIT/ML Solostar Pen INNJECT 50 UNITS S.Q. ONCE DAILY AT 10 P.M. (Patient taking differently: Inject 50 Units into the skin at bedtime. )  . losartan (COZAAR) 25 MG tablet Take 1 tablet (25 mg total) by mouth every evening. (Patient taking differently: Take 50 mg by mouth every evening. )  . metFORMIN (GLUCOPHAGE) 500 MG tablet TAKE 1 TABLET BY MOUTH TWICE DAILY WITH MEALS. (Patient taking differently: Take 1,000 mg by mouth 2 (two) times daily with a meal. )  . metoCLOPramide (REGLAN) 10 MG/10ML SOLN Take 10 mLs (10 mg total) by mouth 3 (three) times daily. (Patient taking differently: Take 5 mg by mouth 2 (two) times daily. )  . neomycin-bacitracin-polymyxin (NEOSPORIN) ointment Apply 1 application topically daily.  . nitroGLYCERIN (NITROSTAT) 0.4 MG SL tablet Place 1 tablet (0.4 mg total) under the tongue every 5 (five) minutes as needed.  Marland Kitchen NOVOLOG FLEXPEN 100 UNIT/ML FlexPen INJECT 12-18 UNITS S.Q. THREE TIMES DAILY WITH MEALS. (Patient taking differently: Inject 12-22 Units into the skin 3 (three) times  daily. Per MD sliding scale for blood sugars over 150)  . ondansetron (ZOFRAN ODT) 4 MG disintegrating tablet Take 1 tablet (4 mg total) by mouth every 8 (eight) hours as needed for nausea or vomiting.  Marland Kitchen oxyCODONE-acetaminophen (PERCOCET/ROXICET) 5-325 MG tablet Take 1 tablet by mouth every 4 (four) hours as needed. (Patient taking differently: Take 1 tablet by mouth every 4 (four) hours as needed for moderate pain. )  . promethazine (PHENERGAN) 12.5 MG tablet Take 1 tablet (12.5 mg total) by mouth every 6 (six) hours as needed for nausea or vomiting.  . sertraline (ZOLOFT) 100  MG tablet Take 200 mg by mouth at bedtime.   Marland Kitchen spironolactone (ALDACTONE) 25 MG tablet Take 0.5 tablets (12.5 mg total) by mouth daily.  . ticagrelor ([name]) 90 MG TABS tablet Take 1 tablet (90 mg total) by mouth 2 (two) times daily.  . furosemide (LASIX) 40 MG tablet Take 1 tablet (40 mg total) by mouth 2 (two) times daily.  . potassium chloride (K-DUR) 10 MEQ tablet Take 1 tablet (10 mEq total) by mouth 2 (two) times daily.   No current facility-administered medications for this visit.  (Other)      REVIEW OF SYSTEMS: ROS    Positive for: Endocrine, Eyes   Negative for: Constitutional, Gastrointestinal, Neurological, Skin, Genitourinary, Musculoskeletal, HENT, Cardiovascular, Respiratory, Psychiatric, Allergic/Imm, Heme/Lymph   Last edited by Roselee Nova D, COT on 09/08/2019  1:34 PM. (History)       ALLERGIES Allergies  Allergen Reactions  . Canagliflozin Other (See Comments)    Vaginal yeast infections  . Nsaids Other (See Comments)    Yeast infection     PAST MEDICAL HISTORY Past Medical History:  Diagnosis Date  . Acid reflux   . Anxiety   . Arthritis   . 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 CAD (coronary artery disease) 11/13/2018   Late presentation anterior MI 12/19 >> LHC - dLM 25, mLAD 99, oOM2 100 (R-L collats), irreg RCA, EF 25-35 >> PCI: POBA to mLAD  . Chronic systolic CHF (congestive heart failure) ([place]) 11/28/2018   Ischemic CM // late presentation ant MI 10/2018 tx with POBA to LAD (residual CAD with CTO of the OM2) // Echo 12/19:  No mural apical thrombus, septal, apical mid ant and inf HK; mild LVH, EF 30-35, mild MR // Echo 01/2019: EF 30-35, Gr 1 DD, diff HK, apical AK, mild MR   . CKD (chronic kidney disease), stage I   . Diabetes mellitus type 1 ([place])   . Former tobacco use   . Gastroparesis   . Hypercholesteremia   . Hypertension   . Osteomyelitis ([place])    a. s/p R forefoot amputation.  . Renal failure   .  Sciatica    Past Surgical History:  Procedure Laterality Date  . AMPUTATION Right 11/03/2011   Procedure: AMPUTATION RAY;  Surgeon: Jamesetta So;  Location: [facility];  Service: General;  Laterality: Right;  Right fourth and fifth metatarsal   . APPENDECTOMY    . CESAREAN SECTION  2004 and 2007   x2  . CORONARY/GRAFT ACUTE MI REVASCULARIZATION N/A 11/11/2018   Procedure: CORONARY/GRAFT ACUTE MI REVASCULARIZATION;  Surgeon: Jettie Booze, MD;  Location: [place] [facility];  Service: Cardiovascular;  Laterality: N/A;  . LEFT HEART CATH AND CORONARY ANGIOGRAPHY N/A 11/11/2018   Procedure: LEFT HEART CATH AND CORONARY ANGIOGRAPHY;  Surgeon: Jettie Booze, MD;  Location[facility]  INVASIVE CV LAB;  Service: Cardiovascular;  Laterality: N/A;  . TUBAL LIGATION      FAMILY HISTORY Family History  Problem Relation Age of Onset  . Diabetes Father   . Lung cancer Father   . Alcoholism Father   . Asthma Mother   . Kidney disease Mother   . Anemia Mother        hemolytic  . Hypertension Mother   . Heart attack Paternal Grandmother   . Diabetes Paternal Grandmother   . Diabetes Paternal Grandfather   . Anesthesia problems Neg Hx   . Hypotension Neg Hx   . Malignant hyperthermia Neg Hx   . Pseudochol deficiency Neg Hx     SOCIAL HISTORY Social History   Tobacco Use  . Smoking status: Former Smoker    Packs/day: 0.25    Years: 20.00    Pack years: 5.00    Types: Cigarettes    Quit date: 12/29/2013    Years since quitting: 5.6  . Smokeless tobacco: Never Used  Substance Use Topics  . Alcohol use: No  . Drug use: No         OPHTHALMIC EXAM:  Base Eye Exam    Visual Acuity (Snellen - Linear)      Right Left   Dist [place] CF at 3' CF@face    Dist ph [place] NI NI       Tonometry (Tonopen, 1:42 PM)      Right Left   Pressure 19 17       Pupils      Dark Light Shape React APD   Right 3 2 Round Slow None   Left 3 2 Round Slow None       Visual Fields (Counting fingers)       Left Right   Restrictions Total superior temporal, inferior temporal, superior nasal, inferior nasal deficiencies Total inferior nasal deficiency       Extraocular Movement      Right Left    Full, Ortho Full, Ortho       Neuro/Psych    Oriented x3: Yes   Mood/Affect: Normal       Dilation    Both eyes: 2.5% Phenylephrine, 1.0% Mydriacyl @ 1:42 PM        Slit Lamp and Fundus Exam    Slit Lamp Exam      Right Left   Lids/Lashes Dermatochalasis - upper lid, Meibomian gland dysfunction Dermatochalasis - upper lid, Meibomian gland dysfunction   Conjunctiva/Sclera White and quiet White and quiet   Cornea Clear Clear   Anterior Chamber Deep and quiet Deep and quiet   Iris Round and dilated, No NVI Round and dilated, No NVI   Lens 1+ Nuclear sclerosis, 1+ Cortical cataract 1+ Nuclear sclerosis, 1+ Cortical cataract   Vitreous Vitreous syneresis, +VH greatest inferiorly Vitreous syneresis       Fundus Exam      Right Left   Disc Pink and Sharp, +NVD, fibrotic NVE superior to nerve obscured by fibrosis   C/D Ratio 0.5 no view   Macula inferior TRD, scattered IRH, pre-retinal fibrosis Closed wolf-jaw TRD with macular hole   Vessels Vascular attenuation, Tortuous, +fibrotic NVE superior to nerve and ST arcades 4+ fibrotic NVD w/ TRD   Periphery focal TRD at 0100 above disc and also above ST arcades, scattered IRH and exudate, good 360 PRP scattered PRP, almost total detachment -- extension of central/posterior TRD          IMAGING AND PROCEDURES  Imaging and Procedures for @TODAY @  OCT, Retina - OU - Both Eyes       Right Eye Quality was good. Central Foveal Thickness: 312. Progression has been stable. Findings include abnormal foveal contour, subretinal fluid, macular pucker, epiretinal membrane, intraretinal hyper-reflective material, intraretinal fluid (Mild interval progression of TRD inferior macula).   Left Eye Quality was good. Progression has been stable.  Findings include abnormal foveal contour, subretinal fluid, preretinal fibrosis, epiretinal membrane, vitreous traction.   Notes *Images captured and stored on drive  Diagnosis / Impression:  OD: TRD involving inferior macula -- Mild interval progression of TRD inferior macula OS: total macula-involving TRD  Clinical management:  See below  Abbreviations: NFP - Normal foveal profile. CME - cystoid macular edema. PED - pigment epithelial detachment. IRF - intraretinal fluid. SRF - subretinal fluid. EZ - ellipsoid zone. ERM - epiretinal membrane. ORA - outer retinal atrophy. ORT - outer retinal tubulation. SRHM - subretinal hyper-reflective material        Intravitreal Injection, Pharmacologic Agent - OD - Right Eye       Time Out 09/08/2019. 3:07 PM. Confirmed correct patient, procedure, site, and patient consented.   Anesthesia Topical anesthesia was used. Anesthetic medications included Lidocaine 2%, Proparacaine 0.5%.   Procedure Preparation included 5% betadine to ocular surface, eyelid speculum. A supplied needle was used.   Injection:  1.25 mg Bevacizumab (AVASTIN) SOLN   NDC: [id number], Lot: [id number] , Expiration date: 11/08/2019   Route: Intravitreal, Site: Right Eye, Waste: 0 mL  Post-op Post injection exam found visual acuity of at least counting fingers. The patient tolerated the procedure well. There were no complications. The patient received written and verbal post procedure care education.                 ASSESSMENT/PLAN:    ICD-10-CM   1. 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 Retinal edema  H35.81 OCT, Retina - OU - Both Eyes  3. Essential hypertension  I10   4. Hypertensive retinopathy of both eyes  H35.033   5.  Combined forms of age-related cataract of both eyes  H25.813     1,2. 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)  - formerly managed at [facility] -- s/p PRP OU -- last visit in 2017  - extensive history of severe disease and medical noncompliance  - exam shows and OCT confirms TRD OU -- OD with inf macula and foveal involvement; OS with closed wolf-jaw total TRD with macular hole  - discussed findings and very poor prognosis given chronicity of problems  - BCVA CF OU  - specifically discussed possibility of further vision loss even with surgery -- pt verbalized understanding and wishes to proceed with surgery  - RBA of procedure discussed, questions answered  - informed consent obtained and signed  - surgery scheduled for September 14, 2019 -- [facility] OR 08, 11:30AM  - received medical clearance from cardiologist - okay to stop blood thinner prior to surgery - pt instructed to stop today  - recommend pre-op IVA OD #1 today, 10.16.20  - pt wishes to proceed  - RBA of procedure discussed, questions answered  - informed consent obtained and signed  - see procedure note  - f/u October 23 -- POV1  3,4. Hypertensive retinopathy  OU  - discussed importance of tight BP control  - monitor  5. 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  - discussed diagnosis and progression  - not yet visually significant  - monitor for now  Ophthalmic Meds Ordered this visit:  Meds ordered this encounter  Medications  . Bevacizumab (AVASTIN) SOLN 1.25 mg       Return in about 1 week (around 09/15/2019) for POV1 OD.  There are no Patient Instructions on file for this visit.   Explained the diagnoses, plan, and follow up with the patient and they expressed understanding.  Patient expressed understanding of the importance of proper follow up care.   This document serves as a record of  services personally performed by Gardiner Sleeper, MD, PhD. It was created on their behalf by Roselee Nova, COMT. The creation of this record is the provider's dictation and/or activities during the visit.  Electronically signed by: Roselee Nova, COMT 09/09/19 12:40 AM   Gardiner Sleeper, M.D., Ph.D. Diseases & Surgery of the Retina and Vitreous [facility] 09/09/19  I have reviewed the above documentation for accuracy and completeness, and I agree with the above. Gardiner Sleeper, M.D., Ph.D. 09/09/19 12:40 AM    Abbreviations: M myopia (nearsighted); A astigmatism; H hyperopia (farsighted); P presbyopia; Mrx spectacle prescription;  CTL contact lenses; OD right eye; OS left eye; OU both eyes  XT exotropia; ET esotropia; PEK punctate epithelial keratitis; PEE punctate epithelial erosions; DES dry eye syndrome; MGD meibomian gland dysfunction; ATs artificial tears; PFAT's preservative free artificial tears; [place] nuclear sclerotic cataract; PSC posterior subcapsular cataract; ERM epi-retinal membrane; PVD posterior vitreous detachment; RD retinal detachment; DM diabetes mellitus; DR diabetic retinopathy; NPDR non-proliferative diabetic retinopathy; PDR proliferative diabetic retinopathy; CSME clinically significant macular edema; DME diabetic macular edema; dbh dot blot hemorrhages; CWS cotton wool spot; POAG primary open angle glaucoma; C/D cup-to-disc ratio; HVF humphrey visual field; GVF goldmann visual field; OCT optical coherence tomography; IOP intraocular pressure; BRVO Branch retinal vein occlusion; CRVO central retinal vein occlusion; CRAO central retinal artery occlusion; BRAO branch retinal artery occlusion; RT retinal tear; SB scleral buckle; PPV pars plana vitrectomy; VH Vitreous hemorrhage; PRP panretinal laser photocoagulation; IVK intravitreal kenalog; VMT vitreomacular traction; MH Macular hole;  NVD neovascularization of the disc; NVE neovascularization elsewhere;  AREDS age related eye disease study; ARMD age related macular degeneration; POAG primary open angle glaucoma; EBMD epithelial/anterior basement membrane dystrophy; ACIOL anterior chamber intraocular lens; IOL intraocular lens; PCIOL posterior chamber intraocular lens; Phaco/IOL phacoemulsification with intraocular lens placement; Scenic photorefractive keratectomy; LASIK laser assisted in situ keratomileusis; HTN hypertension; DM diabetes mellitus; COPD chronic obstructive pulmonary disease

## 2019-09-08 ENCOUNTER — Encounter (INDEPENDENT_AMBULATORY_CARE_PROVIDER_SITE_OTHER): Payer: Self-pay | Admitting: Ophthalmology

## 2019-09-08 ENCOUNTER — Ambulatory Visit (INDEPENDENT_AMBULATORY_CARE_PROVIDER_SITE_OTHER): Payer: Medicaid Other | Admitting: Ophthalmology

## 2019-09-08 DIAGNOSIS — H3581 Retinal edema: Secondary | ICD-10-CM

## 2019-09-08 DIAGNOSIS — H35033 Hypertensive retinopathy, bilateral: Secondary | ICD-10-CM | POA: Diagnosis not present

## 2019-09-08 DIAGNOSIS — H25813 Combined forms of age-related cataract, bilateral: Secondary | ICD-10-CM

## 2019-09-08 DIAGNOSIS — I10 Essential (primary) hypertension: Secondary | ICD-10-CM | POA: Diagnosis not present

## 2019-09-08 DIAGNOSIS — E113523 Type 2 diabetes mellitus with proliferative diabetic retinopathy with traction retinal detachment involving the macula, bilateral: Secondary | ICD-10-CM

## 2019-09-08 MED ORDER — BEVACIZUMAB CHEMO INJECTION 1.25MG/0.05ML SYRINGE FOR KALEIDOSCOPE
1.2500 mg | INTRAVITREAL | Status: AC | PRN
  Administered 2019-09-08: 1.25 mg via INTRAVITREAL

## 2019-09-11 ENCOUNTER — Other Ambulatory Visit (HOSPITAL_COMMUNITY)
Admission: RE | Admit: 2019-09-11 | Discharge: 2019-09-11 | Disposition: A | Discharge status: Home / Self Care | Payer: Medicaid Other | Source: Ambulatory Visit | Attending: Ophthalmology | Admitting: Ophthalmology

## 2019-09-11 DIAGNOSIS — Z20828 Contact with and (suspected) exposure to other viral communicable diseases: Secondary | ICD-10-CM | POA: Diagnosis not present

## 2019-09-11 DIAGNOSIS — Z01812 Encounter for preprocedural laboratory examination: Secondary | ICD-10-CM | POA: Insufficient documentation

## 2019-09-11 LAB — SARS CORONAVIRUS 2 (TAT 6-24 HRS): SARS Coronavirus 2: NEGATIVE

## 2019-09-11 NOTE — H&P (Signed)
Anne [name] is an 38 y.o. female.    Chief Complaint: Decreased vision OD  HPI: Pt with extensive cardiac history and DM2 with proliferative retinopathy previously managed at [facility]. Presented with stable vision loss OS and developed progressive vision loss OD due to diabetic tractional retinal detachment. After a discussion of risks, benefits and alternatives to surgery, pt elected to proceed with 25g PPV for complex TRD repair, OD, under general anesthesia.    Past Medical History:  Diagnosis Date  . Acid reflux   . Anxiety   . Arthritis   . 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 CAD (coronary artery disease) 11/13/2018   Late presentation anterior MI 12/19 >> LHC - dLM 25, mLAD 99, oOM2 100 (R-L collats), irreg RCA, EF 25-35 >> PCI: POBA to mLAD  . Chronic systolic CHF (congestive heart failure) ([place]) 11/28/2018   Ischemic CM // late presentation ant MI 10/2018 tx with POBA to LAD (residual CAD with CTO of the OM2) // Echo 12/19:  No mural apical thrombus, septal, apical mid ant and inf HK; mild LVH, EF 30-35, mild MR // Echo 01/2019: EF 30-35, Gr 1 DD, diff HK, apical AK, mild MR   . CKD (chronic kidney disease), stage I   . Diabetes mellitus type 1 ([place])   . Former tobacco use   . Gastroparesis   . Hypercholesteremia   . Hypertension   . Osteomyelitis ([place])    a. s/p R forefoot amputation.  . Renal failure   . Sciatica     Past Surgical History:  Procedure Laterality Date  . AMPUTATION Right 11/03/2011   Procedure: AMPUTATION RAY;  Surgeon: Jamesetta So;  Location: [facility];  Service: General;  Laterality: Right;  Right fourth and fifth metatarsal   . APPENDECTOMY    . CESAREAN SECTION  2004 and 2007   x2  . CORONARY/GRAFT ACUTE MI REVASCULARIZATION N/A 11/11/2018   Procedure: CORONARY/GRAFT ACUTE MI REVASCULARIZATION;  Surgeon: Jettie Booze, MD;  Location: [place] [facility];  Service: Cardiovascular;  Laterality: N/A;  . LEFT  HEART CATH AND CORONARY ANGIOGRAPHY N/A 11/11/2018   Procedure: LEFT HEART CATH AND CORONARY ANGIOGRAPHY;  Surgeon: Jettie Booze, MD;  Location: [place] [facility];  Service: Cardiovascular;  Laterality: N/A;  . TUBAL LIGATION      Family History  Problem Relation Age of Onset  . Diabetes Father   . Lung cancer Father   . Alcoholism Father   . Asthma Mother   . Kidney disease Mother   . Anemia Mother        hemolytic  . Hypertension Mother   . Heart attack Paternal Grandmother   . Diabetes Paternal Grandmother   . Diabetes Paternal Grandfather   . Anesthesia problems Neg Hx   . Hypotension Neg Hx   . Malignant hyperthermia Neg Hx   . Pseudochol deficiency Neg Hx    Social History:  reports that she quit smoking about 5 years ago. Her smoking use included cigarettes. She has a 5.00 pack-year smoking history. She has never used smokeless tobacco. She reports that she does not drink alcohol or use drugs.  Allergies:  Allergies  Allergen Reactions  . Canagliflozin Other (See Comments)    Vaginal yeast infections  . Nsaids Other (See Comments)    Yeast infection     No medications prior to admission.    Review of systems otherwise negative  There  were no vitals taken for this visit.  Physical exam: Mental status: oriented x3. +Anxiety Eyes: See eye exam associated with this date of surgery Ears, Nose, Throat: within normal limits Neck: Within Normal limits General: within normal limits Chest: Within normal limits Breast: deferred Heart: Within normal limits Abdomen: Within normal limits GU: deferred Extremities: within normal limits Skin: within normal limits  Assessment/Plan 1. DM2 w/ tractional retinal detachment, OD  Plan: To [facility] for 25g PPV w/ membrane peel, laser, and silicon oil OD under general anesthesia - case scheduled for 10.22.20, 1130 am, [facility] OR 08   Gardiner Sleeper, M.D., Ph.D. Vitreoretinal Surgeon [facility]

## 2019-09-12 ENCOUNTER — Encounter (HOSPITAL_COMMUNITY): Payer: Self-pay

## 2019-09-12 ENCOUNTER — Other Ambulatory Visit: Payer: Self-pay

## 2019-09-12 NOTE — Progress Notes (Signed)
[facility] Note  09/15/2019     CHIEF COMPLAINT Patient presents for Post-op Follow-up   HISTORY OF PRESENT ILLNESS: Anne [name] is a 38 y.o. female who presents to the clinic today for:   HPI    Post-op Follow-up    In right eye.  Discomfort includes pain, itching and tearing.  Negative for discharge and floaters.  I, the attending physician,  performed the HPI with the patient and updated documentation appropriately.          Comments    1 day po S/P ppv,mp (09-14-19) Some pain and tearing.       Last edited by Bernarda Caffey, MD on 09/17/2019  1:28 AM. (History)    pt states last night was "okay" she states she has a hard time sleeping on her side  Referring physician: Burt Ek, [place],  [place] [id number]  HISTORICAL INFORMATION:   Selected notes from the MEDICAL RECORD NUMBER Referred by Dr. Stevenson Clinch for concern of VH / TRD OS LEE: 08.27.20 (E. [place]) [BCVA: OD: "blurry" OS" NLP] Ocular Hx- PMH-DM (a1c: 7.0, takes invokana, lantus, novolog, metformin), HLD, HTN   CURRENT MEDICATIONS: No current outpatient medications on file. (Ophthalmic Drugs)   No current facility-administered medications for this visit.  (Ophthalmic Drugs)   Current Outpatient Medications (Other)  Medication Sig  . albuterol (VENTOLIN HFA) 108 (90 Base) MCG/ACT inhaler Inhale 2 puffs into the lungs every 6 (six) hours as needed for wheezing or shortness of breath.  Marland Kitchen aspirin (GNP ASPIRIN LOW DOSE) 81 MG EC tablet Take 1 tablet (81 mg total) by mouth daily with breakfast. Swallow whole. (Patient taking differently: Take 81 mg by mouth daily with breakfast. )  . atorvastatin (LIPITOR) 80 MG tablet Take 1 tablet (80 mg total) by mouth every evening.  . carvedilol (COREG) 6.25 MG tablet Take 1.5 tablets (9.375 mg total) by mouth 2 (two) times daily.  Marland Kitchen gabapentin (NEURONTIN) 300 MG capsule Take 300 mg by mouth 3 (three) times  daily as needed (for neuropathy pain).   . isosorbide mononitrate (IMDUR) 30 MG 24 hr tablet Take 1 tablet (30 mg total) by mouth daily.  Marland Kitchen LANTUS SOLOSTAR 100 UNIT/ML Solostar Pen INNJECT 50 UNITS S.Q. ONCE DAILY AT 10 P.M. (Patient taking differently: Inject 50 Units into the skin at bedtime. )  . losartan (COZAAR) 25 MG tablet Take 1 tablet (25 mg total) by mouth every evening. (Patient taking differently: Take 50 mg by mouth every evening. )  . metFORMIN (GLUCOPHAGE) 500 MG tablet TAKE 1 TABLET BY MOUTH TWICE DAILY WITH MEALS. (Patient taking differently: Take 1,000 mg by mouth 2 (two) times daily with a meal. )  . metoCLOPramide (REGLAN) 10 MG/10ML SOLN Take 10 mLs (10 mg total) by mouth 3 (three) times daily. (Patient taking differently: Take 5 mg by mouth 2 (two) times daily. )  . neomycin-bacitracin-polymyxin (NEOSPORIN) ointment Apply 1 application topically daily.  . nitroGLYCERIN (NITROSTAT) 0.4 MG SL tablet Place 1 tablet (0.4 mg total) under the tongue every 5 (five) minutes as needed.  Marland Kitchen NOVOLOG FLEXPEN 100 UNIT/ML FlexPen INJECT 12-18 UNITS S.Q. THREE TIMES DAILY WITH MEALS. (Patient taking differently: Inject 12-22 Units into the skin 3 (three) times daily. Per MD sliding scale for blood sugars over 150)  . ondansetron (ZOFRAN ODT) 4 MG disintegrating tablet Take 1 tablet (4 mg total) by mouth every 8 (eight) hours as needed for nausea or  vomiting.  Marland Kitchen oxyCODONE-acetaminophen (PERCOCET/ROXICET) 5-325 MG tablet Take 1 tablet by mouth every 4 (four) hours as needed. (Patient taking differently: Take 1 tablet by mouth every 4 (four) hours as needed for moderate pain. )  . promethazine (PHENERGAN) 12.5 MG tablet Take 1 tablet (12.5 mg total) by mouth every 6 (six) hours as needed for nausea or vomiting.  . sertraline (ZOLOFT) 100 MG tablet Take 200 mg by mouth at bedtime.   Marland Kitchen spironolactone (ALDACTONE) 25 MG tablet Take 0.5 tablets (12.5 mg total) by mouth daily.  . ticagrelor ([name]) 90  MG TABS tablet Take 1 tablet (90 mg total) by mouth 2 (two) times daily.  . furosemide (LASIX) 40 MG tablet Take 1 tablet (40 mg total) by mouth 2 (two) times daily.  . potassium chloride (K-DUR) 10 MEQ tablet Take 1 tablet (10 mEq total) by mouth 2 (two) times daily.   No current facility-administered medications for this visit.  (Other)      REVIEW OF SYSTEMS: ROS    Positive for: Endocrine, Eyes   Negative for: Constitutional, Gastrointestinal, Neurological, Skin, Genitourinary, Musculoskeletal, HENT, Cardiovascular, Respiratory, Psychiatric, Allergic/Imm, Heme/Lymph   Last edited by Elmore Guise, COT on 09/15/2019  9:11 AM. (History)       ALLERGIES Allergies  Allergen Reactions  . Canagliflozin Other (See Comments)    Vaginal yeast infections  . Nsaids Other (See Comments)    Yeast infection     PAST MEDICAL HISTORY Past Medical History:  Diagnosis Date  . Acid reflux   . Anxiety   . Arthritis   . 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 CAD (coronary artery disease) 11/13/2018   Late presentation anterior MI 12/19 >> LHC - dLM 25, mLAD 99, oOM2 100 (R-L collats), irreg RCA, EF 25-35 >> PCI: POBA to mLAD  . Chronic systolic CHF (congestive heart failure) ([place]) 11/28/2018   Ischemic CM // late presentation ant MI 10/2018 tx with POBA to LAD (residual CAD with CTO of the OM2) // Echo 12/19:  No mural apical thrombus, septal, apical mid ant and inf HK; mild LVH, EF 30-35, mild MR // Echo 01/2019: EF 30-35, Gr 1 DD, diff HK, apical AK, mild MR   . CKD (chronic kidney disease), stage I   . Diabetes mellitus type 1 ([place])   . Dyspnea   . Former tobacco use   . Gastroparesis   . Hypercholesteremia   . Hypertension   . Myocardial infarction ([place]) 2019  . Osteomyelitis ([place])    a. s/p R forefoot amputation.  . Renal failure   . Sciatica    Past Surgical History:  Procedure Laterality Date  . AMPUTATION Right 11/03/2011   Procedure:  AMPUTATION RAY;  Surgeon: Jamesetta So;  Location: [facility];  Service: General;  Laterality: Right;  Right fourth and fifth metatarsal   . APPENDECTOMY    . CARDIAC CATHETERIZATION  10/2018  . CESAREAN SECTION  2004 and 2007   x2  . CORONARY/GRAFT ACUTE MI REVASCULARIZATION N/A 11/11/2018   Procedure: CORONARY/GRAFT ACUTE MI REVASCULARIZATION;  Surgeon: Jettie Booze, MD;  Location: [facility];  Service: Cardiovascular;  Laterality: N/A;  . FRACTURE SURGERY  2000  . INJECTION OF SILICONE OIL Right [id number]   Procedure: Injection Of Silicone Oil;  Surgeon: Bernarda Caffey, MD;  Location: [facility];  Service: Ophthalmology;  Laterality: Right;  . LEFT HEART CATH AND CORONARY ANGIOGRAPHY N/A 11/11/2018  Procedure: LEFT HEART CATH AND CORONARY ANGIOGRAPHY;  Surgeon: Jettie Booze, MD;  Location: [facility];  Service: Cardiovascular;  Laterality: N/A;  . MEMBRANE PEEL Right 09/14/2019   Procedure: Antoine Primas;  Surgeon: Bernarda Caffey, MD;  Location: [facility];  Service: Ophthalmology;  Laterality: Right;  . PARS PLANA VITRECTOMY Right 09/14/2019   Procedure: [place] Vitrectomy With 25 Gauge;  Surgeon: Bernarda Caffey, MD;  Location: [facility];  Service: Ophthalmology;  Laterality: Right;  . PHOTOCOAGULATION WITH LASER Right 09/14/2019   Procedure: Photocoagulation With Laser;  Surgeon: Bernarda Caffey, MD;  Location: [facility];  Service: Ophthalmology;  Laterality: Right;  . 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   Procedure: REPAIR OF COMPLEX TRACTION RETINAL DETACHMENT;  Surgeon: Bernarda Caffey, MD;  Location: [facility];  Service: Ophthalmology;  Laterality: Right;  . TUBAL LIGATION      FAMILY HISTORY Family History  Problem Relation Age of Onset  . Diabetes Father   . Lung cancer Father   . Alcoholism Father   . Asthma Mother   . Kidney disease Mother   . Anemia Mother        hemolytic  . Hypertension Mother   . Heart attack Paternal Grandmother    . Diabetes Paternal Grandmother   . Diabetes Paternal Grandfather   . Anesthesia problems Neg Hx   . Hypotension Neg Hx   . Malignant hyperthermia Neg Hx   . Pseudochol deficiency Neg Hx     SOCIAL HISTORY Social History   Tobacco Use  . Smoking status: Former Smoker    Packs/day: 0.25    Years: 20.00    Pack years: 5.00    Types: Cigarettes    Quit date: 12/29/2013    Years since quitting: 5.7  . Smokeless tobacco: Never Used  Substance Use Topics  . Alcohol use: No  . Drug use: No         OPHTHALMIC EXAM:  Base Eye Exam    Visual Acuity (Snellen - Linear)      Right Left   Dist [place] CF at 3' 20/HM   Dist ph [place] 20/NI 20/NI       Tonometry (Tonopen, 9:16 AM)      Right Left   Pressure 17        Neuro/Psych    Oriented x3: Yes   Mood/Affect: Normal       Dilation    Right eye: 1.0% Mydriacyl, 2.5% Phenylephrine @ 9:18 AM        Slit Lamp and Fundus Exam    Slit Lamp Exam      Right Left   Lids/Lashes Periorbital edema Dermatochalasis - upper lid, Meibomian gland dysfunction   Conjunctiva/Sclera Subconjunctival hemorrhage, sutures intact, mild Chemosis temporally White and quiet   Cornea 3+ Punctate epithelial erosions, 1+ Descemet's folds Clear   Anterior Chamber Deep and quiet Deep and quiet   Iris Round and dilated, No NVI Round and dilated, No NVI   Lens 1+ Nuclear sclerosis, 1+ Cortical cataract 1+ Nuclear sclerosis, 1+ Cortical cataract   Vitreous post vitrectomy; good silicon oil fill Vitreous syneresis       Fundus Exam      Right Left   Disc Pink and Sharp, +NVD, fibrotic NVE superior to nerve improved    C/D Ratio 0.5 no view   Macula Flat under oil, fibrosis improved, shallow residual SRF inferiorly    Vessels Vascular attenuation, Tortuous, +fibrotic NVE superior to nerve and ST arcades  Periphery focal TRD at 0100 above disc and also above ST arcades improved, scattered IRH and exudate, good 360 PRP with good posterior fill in            IMAGING AND PROCEDURES  Imaging and Procedures for @TODAY @           ASSESSMENT/PLAN:    ICD-10-CM   1. 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 Retinal edema  H35.81   3. Essential hypertension  I10   4. Hypertensive retinopathy of both eyes  H35.033   5. Combined forms of age-related cataract of both eyes  H25.813     1,2. 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  - discussed findings and very poor prognosis given chronicity of problems  - BCVA CF OU  - S/P IVA #1 OD (10.16.20)  - now POD1 s/p PPV/MP/EL/FAX/14% C3F8 OD, 10.22.20             - doing well this morning             - fibrosis improved and retina flat under oil              - IOP okay at 17             - start   PF 4x/day OD                          zymaxid QID OD                          Atropine BID OD                          Brimonidine BID OD                          PSO ung QID OD              - cont face down positioning x3 days; avoid laying flat on back              - eye shield when sleeping              - post op drop and positioning instructions reviewed              - tylenol for pain   - f/u 1 week, DFE, OCT  3,4. Hypertensive retinopathy OU  - discussed importance of tight BP control  - monitor  5. 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  - discussed diagnosis and progression  - not yet visually significant  - monitor for now  Ophthalmic Meds Ordered this visit:   No orders of the defined types were placed in this encounter.      Return in about 1 week (around 09/22/2019) for POV, TRD OD repair, DFE, OCT.  There are no Patient Instructions on  file for this visit.   Explained the diagnoses, plan, and follow up with the patient and they expressed understanding.  Patient expressed understanding of the importance of proper follow up care.   This document serves as a record of services personally performed by Gardiner Sleeper, MD, PhD. It was created on their behalf by Roselee Nova, COMT. The creation of this record is the provider's dictation and/or activities during the visit.  Electronically signed by: Roselee Nova, COMT 09/17/19 1:30 AM   Gardiner Sleeper, M.D., Ph.D. Diseases & Surgery of the Retina and [place] 09/17/19  I have reviewed the above documentation for accuracy and completeness, and I agree with the above. Gardiner Sleeper, M.D., Ph.D. 09/17/19 1:31 AM    Abbreviations: M myopia (nearsighted); A astigmatism; H hyperopia (farsighted); P presbyopia; Mrx spectacle prescription;  CTL contact lenses; OD right eye; OS left eye; OU both eyes  XT exotropia; ET esotropia; PEK punctate epithelial keratitis; PEE punctate epithelial erosions; DES dry eye syndrome; MGD meibomian gland dysfunction; ATs artificial tears; PFAT's preservative free artificial tears; [place] nuclear sclerotic cataract; PSC posterior subcapsular cataract; ERM epi-retinal membrane; PVD posterior vitreous detachment; RD retinal detachment; DM diabetes mellitus; DR diabetic retinopathy; NPDR non-proliferative diabetic retinopathy; PDR proliferative diabetic retinopathy; CSME clinically significant macular edema; DME diabetic macular edema; dbh dot blot hemorrhages; CWS cotton wool spot; POAG primary open angle glaucoma; C/D cup-to-disc ratio; HVF humphrey visual field; GVF goldmann visual field; OCT optical coherence tomography; IOP intraocular  pressure; BRVO Branch retinal vein occlusion; CRVO central retinal vein occlusion; CRAO central retinal artery occlusion; BRAO branch retinal artery occlusion; RT retinal tear; SB scleral buckle; PPV pars plana vitrectomy; VH Vitreous hemorrhage; PRP panretinal laser photocoagulation; IVK intravitreal kenalog; VMT vitreomacular traction; MH Macular hole;  NVD neovascularization of the disc; NVE neovascularization elsewhere; AREDS age related eye disease study; ARMD age related macular degeneration; POAG primary open angle glaucoma; EBMD epithelial/anterior basement membrane dystrophy; ACIOL anterior chamber intraocular lens; IOL intraocular lens; PCIOL posterior chamber intraocular lens; Phaco/IOL phacoemulsification with intraocular lens placement; [place] photorefractive keratectomy; LASIK laser assisted in situ keratomileusis; HTN hypertension; DM diabetes mellitus; COPD chronic obstructive pulmonary disease

## 2019-09-13 NOTE — Anesthesia Preprocedure Evaluation (Addendum)
Anesthesia Evaluation  Patient identified by MRN, date of birth, ID band Patient awake    Reviewed: Allergy & Precautions, NPO status , Patient's Chart, lab work & pertinent test results, reviewed documented beta blocker date and time   Airway Mallampati: II  TM Distance: >3 FB Neck ROM: Full    Dental  (+) Dental Advisory Given, Poor Dentition, Chipped, Loose,    Pulmonary shortness of breath, former smoker,    Pulmonary exam normal breath sounds clear to auscultation       Cardiovascular hypertension, Pt. on medications and Pt. on home beta blockers + CAD, + Past MI, + Peripheral Vascular Disease and +CHF  Normal cardiovascular exam Rhythm:Regular Rate:Normal  Echo 01/2019 1. The left ventricle has moderate-severely reduced systolic function, with an ejection fraction of 30-35%. The cavity size was moderately dilated. Left ventricular diastolic Doppler parameters are consistent with impaired relaxation. Left ventricular  diffuse hypokinesis.  2. The right ventricle has normal systolc function. The cavity was normal. There is no increase in right ventricular wall thickness.  3. Mild thickening of the aortic valve.  4. Definity used; global hypokinesis with apical akinesis; overall moderate to severe LV dysfunction; mild diastolic dysfunction; moderate LVE; mild MR.    Neuro/Psych PSYCHIATRIC DISORDERS Anxiety    GI/Hepatic Neg liver ROS, GERD  ,  Endo/Other  diabetes, Type 2  Renal/GU Renal disease     Musculoskeletal  (+) Arthritis ,   Abdominal   Peds  Hematology negative hematology ROS (+)   Anesthesia Other Findings   Reproductive/Obstetrics negative OB ROS                                                             Anesthesia Evaluation  Patient identified by MRN, date of birth, ID band Patient awake    Reviewed: Allergy & Precautions, H&P , NPO status , Patient's Chart, lab work &  pertinent test results  History of Anesthesia Complications Negative for: history of anesthetic complications  Airway Mallampati: I      Dental  (+) Teeth Intact   Pulmonary Current Smoker,  clear to auscultation        Cardiovascular hypertension, Pt. on medications Regular Normal    Neuro/Psych    GI/Hepatic GERD-  Medicated and Controlled,  Endo/Other  Diabetes mellitus-, Poorly Controlled, Type 1, Insulin Dependent  Renal/GU      Musculoskeletal   Abdominal   Peds  Hematology   Anesthesia Other Findings   Reproductive/Obstetrics                           Anesthesia Physical Anesthesia Plan  ASA: III  Anesthesia Plan: General   Post-op Pain Management:    Induction: Intravenous, Rapid sequence and Cricoid pressure planned  Airway Management Planned: Oral ETT  Additional Equipment:   Intra-op Plan:   Post-operative Plan:   Informed Consent: I have reviewed the patients History and Physical, chart, labs and discussed the procedure including the risks, benefits and alternatives for the proposed anesthesia with the patient or authorized representative who has indicated his/her understanding and acceptance.     Plan Discussed with:   Anesthesia Plan Comments:         Anesthesia Quick Evaluation  Anesthesia Physical Anesthesia Plan  ASA: III  Anesthesia Plan: General   Post-op Pain Management:    Induction: Intravenous  PONV Risk Score and Plan: 3 and Dexamethasone, Ondansetron, Midazolam and Treatment may vary due to age or medical condition  Airway Management Planned: LMA  Additional Equipment:   Intra-op Plan:   Post-operative Plan: Extubation in OR  Informed Consent: I have reviewed the patients History and Physical, chart, labs and discussed the procedure including the risks, benefits and alternatives for the proposed anesthesia with the patient or authorized representative who has  indicated his/her understanding and acceptance.     Dental advisory given  Plan Discussed with: CRNA  Anesthesia Plan Comments: (Follows with cardiology for Hx of CAD, systolic HF 2/2 ischemic CM, hyperlipidemia, hypertension.  She was admitted in 10/2018 with a late presentation anterior MI c/b post MI pericarditis.  Cardiac Cath demonstrated diffuse dz in the LAD and LCx.  She underwent POBA to the LAD b/c it was not large enough for stenting or CABG.  EF was 30-35%.   Cleared for surgery per telephone encounter 08/28/19 "Given past medical history and time since last visit, based on ACC/AHA guidelines, Anne [name] would be at acceptable risk for the planned procedure without further cardiovascular testing. Per Dr. Harrington Challenger, it will be acceptable to hold [name] prior to surgery as no stent was placed. Please resume  after when ok from surgical standpoint."  Last seen by Dr. Harrington Challenger 08/31/19, per OV note she was having no angina, volume status okay. Continue current medications.   Hx of uncontrolled IDDM with assoc gastroparesis and Osteomyelitis (s/p R forefoot amputation). Will need DOS labs and anesthesia eval.   TTE 01/30/19:   1. The left ventricle has moderate-severely reduced systolic function, with an ejection fraction of 30-35%. The cavity size was moderately dilated. Left ventricular diastolic Doppler parameters are consistent with impaired relaxation. Left ventricular  diffuse hypokinesis.  2. The right ventricle has normal systolc function. The cavity was normal. There is no increase in right ventricular wall thickness.  3. Mild thickening of the aortic valve.  4. Definity used; global hypokinesis with apical akinesis; overall moderate to severe LV dysfunction; mild diastolic dysfunction; moderate LVE; mild MR.)      Anesthesia Quick Evaluation

## 2019-09-13 NOTE — Progress Notes (Signed)
Anesthesia Chart Review: Same day workup  Follows with cardiology for Hx of CAD, systolic HF 2/2 ischemic CM, hyperlipidemia, hypertension.  She was admitted in 10/2018 with a late presentation anterior MI c/b post MI pericarditis.  Cardiac Cath demonstrated diffuse dz in the LAD and LCx.  She underwent POBA to the LAD b/c it was not large enough for stenting or CABG.  EF was 30-35%.   Cleared for surgery per telephone encounter 08/28/19 "Given past medical history and time since last visit, based on ACC/AHA guidelines, Anne [name] would be at acceptable risk for the planned procedure without further cardiovascular testing. Per Dr. Harrington Challenger, it will be acceptable to hold [name] prior to surgery as no stent was placed. Please resume  after when ok from surgical standpoint."  Last seen by Dr. Harrington Challenger 08/31/19, per OV note she was having no angina, volume status okay. Continue current medications.   Hx of uncontrolled IDDM with assoc gastroparesis and Osteomyelitis (s/p R forefoot amputation). Will need DOS labs and anesthesia eval.   TTE 01/30/19:  1. The left ventricle has moderate-severely reduced systolic function, with an ejection fraction of 30-35%. The cavity size was moderately dilated. Left ventricular diastolic Doppler parameters are consistent with impaired relaxation. Left ventricular  diffuse hypokinesis.  2. The right ventricle has normal systolc function. The cavity was normal. There is no increase in right ventricular wall thickness.  3. Mild thickening of the aortic valve.  4. Definity used; global hypokinesis with apical akinesis; overall moderate to severe LV dysfunction; mild diastolic dysfunction; moderate LVE; mild MR.   Anne [name] [facility] Short Stay Center/Anesthesiology Phone [phone] 09/13/2019 10:46 AM

## 2019-09-14 ENCOUNTER — Encounter (HOSPITAL_COMMUNITY): Payer: Self-pay

## 2019-09-14 ENCOUNTER — Other Ambulatory Visit: Payer: Self-pay

## 2019-09-14 ENCOUNTER — Encounter (HOSPITAL_COMMUNITY)
Admission: RE | Disposition: A | Discharge status: Home / Self Care | Payer: Self-pay | Source: Home / Self Care | Attending: Ophthalmology

## 2019-09-14 ENCOUNTER — Ambulatory Visit (HOSPITAL_COMMUNITY)
Admission: RE | Admit: 2019-09-14 | Discharge: 2019-09-14 | Disposition: A | Discharge status: Home / Self Care | Payer: Medicaid Other | Attending: Ophthalmology | Admitting: Ophthalmology

## 2019-09-14 ENCOUNTER — Ambulatory Visit (HOSPITAL_COMMUNITY): Payer: Medicaid Other | Admitting: Physician Assistant

## 2019-09-14 DIAGNOSIS — E1143 Type 2 diabetes mellitus with diabetic autonomic (poly)neuropathy: Secondary | ICD-10-CM | POA: Insufficient documentation

## 2019-09-14 DIAGNOSIS — F419 Anxiety disorder, unspecified: Secondary | ICD-10-CM | POA: Insufficient documentation

## 2019-09-14 DIAGNOSIS — Z89431 Acquired absence of right foot: Secondary | ICD-10-CM | POA: Diagnosis not present

## 2019-09-14 DIAGNOSIS — E113531 Type 2 diabetes mellitus with proliferative diabetic retinopathy with traction retinal detachment not involving the macula, right eye: Secondary | ICD-10-CM | POA: Diagnosis not present

## 2019-09-14 DIAGNOSIS — K219 Gastro-esophageal reflux disease without esophagitis: Secondary | ICD-10-CM | POA: Insufficient documentation

## 2019-09-14 DIAGNOSIS — E1122 Type 2 diabetes mellitus with diabetic chronic kidney disease: Secondary | ICD-10-CM | POA: Insufficient documentation

## 2019-09-14 DIAGNOSIS — E1151 Type 2 diabetes mellitus with diabetic peripheral angiopathy without gangrene: Secondary | ICD-10-CM | POA: Insufficient documentation

## 2019-09-14 DIAGNOSIS — N181 Chronic kidney disease, stage 1: Secondary | ICD-10-CM | POA: Insufficient documentation

## 2019-09-14 DIAGNOSIS — E113521 Type 2 diabetes mellitus with proliferative diabetic retinopathy with traction retinal detachment involving the macula, right eye: Secondary | ICD-10-CM

## 2019-09-14 DIAGNOSIS — Z79899 Other long term (current) drug therapy: Secondary | ICD-10-CM | POA: Diagnosis not present

## 2019-09-14 DIAGNOSIS — I255 Ischemic cardiomyopathy: Secondary | ICD-10-CM | POA: Insufficient documentation

## 2019-09-14 DIAGNOSIS — I5022 Chronic systolic (congestive) heart failure: Secondary | ICD-10-CM | POA: Diagnosis not present

## 2019-09-14 DIAGNOSIS — K3184 Gastroparesis: Secondary | ICD-10-CM | POA: Diagnosis not present

## 2019-09-14 DIAGNOSIS — I2510 Atherosclerotic heart disease of native coronary artery without angina pectoris: Secondary | ICD-10-CM | POA: Diagnosis not present

## 2019-09-14 DIAGNOSIS — I130 Hypertensive heart and chronic kidney disease with heart failure and stage 1 through stage 4 chronic kidney disease, or unspecified chronic kidney disease: Secondary | ICD-10-CM | POA: Diagnosis not present

## 2019-09-14 DIAGNOSIS — Z794 Long term (current) use of insulin: Secondary | ICD-10-CM | POA: Diagnosis not present

## 2019-09-14 DIAGNOSIS — E1165 Type 2 diabetes mellitus with hyperglycemia: Secondary | ICD-10-CM | POA: Insufficient documentation

## 2019-09-14 DIAGNOSIS — I252 Old myocardial infarction: Secondary | ICD-10-CM | POA: Diagnosis not present

## 2019-09-14 DIAGNOSIS — Z7982 Long term (current) use of aspirin: Secondary | ICD-10-CM | POA: Insufficient documentation

## 2019-09-14 DIAGNOSIS — E785 Hyperlipidemia, unspecified: Secondary | ICD-10-CM | POA: Insufficient documentation

## 2019-09-14 HISTORY — PX: MEMBRANE PEEL: SHX5967

## 2019-09-14 HISTORY — PX: PARS PLANA VITRECTOMY: SHX2166

## 2019-09-14 HISTORY — DX: Dyspnea, unspecified: R06.00

## 2019-09-14 HISTORY — PX: PHOTOCOAGULATION WITH LASER: SHX6027

## 2019-09-14 HISTORY — PX: INJECTION OF SILICONE OIL: SHX6422

## 2019-09-14 HISTORY — PX: REPAIR OF COMPLEX TRACTION RETINAL DETACHMENT: SHX6217

## 2019-09-14 LAB — CBC
HCT: 34.6 % — ABNORMAL LOW (ref 36.0–46.0)
Hemoglobin: 11.5 g/dL — ABNORMAL LOW (ref 12.0–15.0)
MCH: 30.4 pg (ref 26.0–34.0)
MCHC: 33.2 g/dL (ref 30.0–36.0)
MCV: 91.5 fL (ref 80.0–100.0)
Platelets: 261 10*3/uL (ref 150–400)
RBC: 3.78 MIL/uL — ABNORMAL LOW (ref 3.87–5.11)
RDW: 13.1 % (ref 11.5–15.5)
WBC: 7.6 10*3/uL (ref 4.0–10.5)
nRBC: 0 % (ref 0.0–0.2)

## 2019-09-14 LAB — GLUCOSE, CAPILLARY
Glucose-Capillary: 220 mg/dL — ABNORMAL HIGH (ref 70–99)
Glucose-Capillary: 206 mg/dL — ABNORMAL HIGH (ref 70–99)
Glucose-Capillary: 216 mg/dL — ABNORMAL HIGH (ref 70–99)

## 2019-09-14 LAB — BASIC METABOLIC PANEL
Anion gap: 12 (ref 5–15)
BUN: 15 mg/dL (ref 6–20)
CO2: 22 mmol/L (ref 22–32)
Calcium: 9 mg/dL (ref 8.9–10.3)
Chloride: 102 mmol/L (ref 98–111)
Creatinine, Ser: 1.14 mg/dL — ABNORMAL HIGH (ref 0.44–1.00)
GFR calc Af Amer: >60 mL/min (ref >60)
GFR calc non Af Amer: >60 mL/min (ref >60)
Glucose, Bld: 229 mg/dL — ABNORMAL HIGH (ref 70–99)
Potassium: 3.7 mmol/L (ref 3.5–5.1)
Sodium: 136 mmol/L (ref 135–145)

## 2019-09-14 LAB — PROTIME-INR
INR: 1 (ref 0.8–1.2)
Prothrombin Time: 13.1 seconds (ref 11.4–15.2)

## 2019-09-14 LAB — POCT PREGNANCY, URINE: Preg Test, Ur: NEGATIVE

## 2019-09-14 SURGERY — REPAIR, RETINAL DETACHMENT, COMPLEX
Anesthesia: General | Site: Eye | Laterality: Right

## 2019-09-14 MED ORDER — PROMETHAZINE HCL 25 MG/ML IJ SOLN
6.2500 mg | INTRAMUSCULAR | Status: DC | PRN
  Administered 2019-09-14: 12.5 mg via INTRAVENOUS

## 2019-09-14 MED ORDER — OXYCODONE HCL 5 MG PO TABS: 5.0000 mg | ORAL_TABLET | Freq: Once | ORAL | Status: DC | PRN

## 2019-09-14 MED ORDER — DEXAMETHASONE SODIUM PHOSPHATE 10 MG/ML IJ SOLN
INTRAMUSCULAR | Status: DC | PRN
  Administered 2019-09-14: 4 mg via INTRAVENOUS

## 2019-09-14 MED ORDER — FENTANYL CITRATE (PF) 100 MCG/2ML IJ SOLN: 25.0000 ug | INTRAMUSCULAR | Status: DC | PRN

## 2019-09-14 MED ORDER — BACITRACIN-POLYMYXIN B 500-10000 UNIT/GM OP OINT
TOPICAL_OINTMENT | OPHTHALMIC | Status: AC
  Filled 2019-09-14: qty 3.5

## 2019-09-14 MED ORDER — LIDOCAINE 2% (20 MG/ML) 5 ML SYRINGE
INTRAMUSCULAR | Status: AC
  Filled 2019-09-14: qty 10

## 2019-09-14 MED ORDER — ONDANSETRON HCL 4 MG/2ML IJ SOLN
INTRAMUSCULAR | Status: AC
  Filled 2019-09-14: qty 2

## 2019-09-14 MED ORDER — SUCCINYLCHOLINE CHLORIDE 200 MG/10ML IV SOSY
PREFILLED_SYRINGE | INTRAVENOUS | Status: AC
  Filled 2019-09-14: qty 10

## 2019-09-14 MED ORDER — FENTANYL CITRATE (PF) 250 MCG/5ML IJ SOLN
INTRAMUSCULAR | Status: AC
  Filled 2019-09-14: qty 5

## 2019-09-14 MED ORDER — POLYMYXIN B SULFATE 500000 UNITS IJ SOLR
INTRAMUSCULAR | Status: AC
  Filled 2019-09-14: qty 500000

## 2019-09-14 MED ORDER — BACITRACIN-POLYMYXIN B 500-10000 UNIT/GM OP OINT
TOPICAL_OINTMENT | OPHTHALMIC | Status: DC | PRN
  Administered 2019-09-14: 1 via OPHTHALMIC

## 2019-09-14 MED ORDER — FENTANYL CITRATE (PF) 100 MCG/2ML IJ SOLN
INTRAMUSCULAR | Status: DC | PRN
  Administered 2019-09-14 (×2): 50 ug via INTRAVENOUS

## 2019-09-14 MED ORDER — PREDNISOLONE ACETATE 1 % OP SUSP
OPHTHALMIC | Status: AC
  Filled 2019-09-14: qty 5

## 2019-09-14 MED ORDER — PHENYLEPHRINE 40 MCG/ML (10ML) SYRINGE FOR IV PUSH (FOR BLOOD PRESSURE SUPPORT)
PREFILLED_SYRINGE | INTRAVENOUS | Status: AC
  Filled 2019-09-14: qty 10

## 2019-09-14 MED ORDER — NA CHONDROIT SULF-NA HYALURON 40-30 MG/ML IO SOLN
INTRAOCULAR | Status: DC | PRN
  Administered 2019-09-14: 0.5 mL via INTRAOCULAR

## 2019-09-14 MED ORDER — BRIMONIDINE TARTRATE 0.2 % OP SOLN
OPHTHALMIC | Status: DC | PRN
  Administered 2019-09-14: 1 [drp] via OPHTHALMIC

## 2019-09-14 MED ORDER — PHENYLEPHRINE HCL 10 % OP SOLN
1.0000 [drp] | OPHTHALMIC | Status: AC | PRN
  Administered 2019-09-14 (×3): 1 [drp] via OPHTHALMIC
  Filled 2019-09-14: qty 5

## 2019-09-14 MED ORDER — OXYCODONE HCL 5 MG/5ML PO SOLN: 5.0000 mg | Freq: Once | ORAL | Status: DC | PRN

## 2019-09-14 MED ORDER — SODIUM CHLORIDE (PF) 0.9 % IJ SOLN
INTRAMUSCULAR | Status: AC
  Filled 2019-09-14: qty 10

## 2019-09-14 MED ORDER — TROPICAMIDE 1 % OP SOLN
1.0000 [drp] | OPHTHALMIC | Status: AC | PRN
  Administered 2019-09-14 (×3): 1 [drp] via OPHTHALMIC
  Filled 2019-09-14: qty 15

## 2019-09-14 MED ORDER — LIDOCAINE 2% (20 MG/ML) 5 ML SYRINGE
INTRAMUSCULAR | Status: AC
  Filled 2019-09-14: qty 5

## 2019-09-14 MED ORDER — ROCURONIUM BROMIDE 10 MG/ML (PF) SYRINGE
PREFILLED_SYRINGE | INTRAVENOUS | Status: AC
  Filled 2019-09-14: qty 10

## 2019-09-14 MED ORDER — PHENYLEPHRINE HCL (PRESSORS) 10 MG/ML IV SOLN
INTRAVENOUS | Status: DC | PRN
  Administered 2019-09-14 (×2): 40 ug via INTRAVENOUS

## 2019-09-14 MED ORDER — CEFTAZIDIME 1 G IJ SOLR
INTRAMUSCULAR | Status: AC
  Filled 2019-09-14: qty 1

## 2019-09-14 MED ORDER — INDOCYANINE GREEN 25 MG IV SOLR
INTRAVENOUS | Status: DC | PRN
  Administered 2019-09-14: 25 mg via OPHTHALMIC

## 2019-09-14 MED ORDER — DEXAMETHASONE SODIUM PHOSPHATE 10 MG/ML IJ SOLN
INTRAMUSCULAR | Status: AC
  Filled 2019-09-14: qty 1

## 2019-09-14 MED ORDER — MIDAZOLAM HCL 5 MG/5ML IJ SOLN
INTRAMUSCULAR | Status: DC | PRN
  Administered 2019-09-14: 2 mg via INTRAVENOUS

## 2019-09-14 MED ORDER — PREDNISOLONE ACETATE 1 % OP SUSP
OPHTHALMIC | Status: DC | PRN
  Administered 2019-09-14: 1 [drp] via OPHTHALMIC

## 2019-09-14 MED ORDER — EPINEPHRINE PF 1 MG/ML IJ SOLN
INTRAOCULAR | Status: DC | PRN
  Administered 2019-09-14 (×2): 500.3 mL

## 2019-09-14 MED ORDER — SUCCINYLCHOLINE CHLORIDE 200 MG/10ML IV SOSY
PREFILLED_SYRINGE | INTRAVENOUS | Status: DC | PRN
  Administered 2019-09-14: 120 mg via INTRAVENOUS

## 2019-09-14 MED ORDER — BEVACIZUMAB CHEMO INJECTION 1.25MG/0.05ML SYRINGE FOR KALEIDOSCOPE
1.2500 mg | Freq: Once | INTRAVITREAL | Status: DC
  Filled 2019-09-14 (×3): qty 0.1

## 2019-09-14 MED ORDER — MEPERIDINE HCL 25 MG/ML IJ SOLN: 6.2500 mg | INTRAMUSCULAR | Status: DC | PRN

## 2019-09-14 MED ORDER — ROCURONIUM BROMIDE 100 MG/10ML IV SOLN
INTRAVENOUS | Status: DC | PRN
  Administered 2019-09-14: 60 mg via INTRAVENOUS
  Administered 2019-09-14 (×2): 20 mg via INTRAVENOUS

## 2019-09-14 MED ORDER — GATIFLOXACIN 0.5 % OP SOLN OPTIME - NO CHARGE
OPHTHALMIC | Status: DC | PRN
  Administered 2019-09-14: 15:00:00 1 [drp] via OPHTHALMIC

## 2019-09-14 MED ORDER — ONDANSETRON HCL 4 MG/2ML IJ SOLN
INTRAMUSCULAR | Status: AC
  Filled 2019-09-14: qty 4

## 2019-09-14 MED ORDER — SUGAMMADEX SODIUM 200 MG/2ML IV SOLN
INTRAVENOUS | Status: DC | PRN
  Administered 2019-09-14: 100 mg via INTRAVENOUS

## 2019-09-14 MED ORDER — TRIAMCINOLONE ACETONIDE 40 MG/ML IJ SUSP
INTRAMUSCULAR | Status: AC
  Filled 2019-09-14: qty 5

## 2019-09-14 MED ORDER — PROPARACAINE HCL 0.5 % OP SOLN
1.0000 [drp] | OPHTHALMIC | Status: AC | PRN
  Administered 2019-09-14 (×3): 1 [drp] via OPHTHALMIC
  Filled 2019-09-14: qty 15

## 2019-09-14 MED ORDER — 0.9 % SODIUM CHLORIDE (POUR BTL) OPTIME
TOPICAL | Status: DC | PRN
  Administered 2019-09-14: 1000 mL

## 2019-09-14 MED ORDER — DORZOLAMIDE HCL-TIMOLOL MAL 2-0.5 % OP SOLN
OPHTHALMIC | Status: AC
  Filled 2019-09-14: qty 10

## 2019-09-14 MED ORDER — DORZOLAMIDE HCL-TIMOLOL MAL 2-0.5 % OP SOLN
OPHTHALMIC | Status: DC | PRN
  Administered 2019-09-14: 1 [drp] via OPHTHALMIC

## 2019-09-14 MED ORDER — PROPOFOL 10 MG/ML IV BOLUS
INTRAVENOUS | Status: DC | PRN
  Administered 2019-09-14: 110 mg via INTRAVENOUS

## 2019-09-14 MED ORDER — LIDOCAINE 2% (20 MG/ML) 5 ML SYRINGE
INTRAMUSCULAR | Status: DC | PRN
  Administered 2019-09-14: 100 mg via INTRAVENOUS

## 2019-09-14 MED ORDER — ONDANSETRON HCL 4 MG/2ML IJ SOLN
INTRAMUSCULAR | Status: DC | PRN
  Administered 2019-09-14: 4 mg via INTRAVENOUS

## 2019-09-14 MED ORDER — GATIFLOXACIN 0.5 % OP SOLN
OPHTHALMIC | Status: AC
  Filled 2019-09-14: qty 2.5

## 2019-09-14 MED ORDER — PROMETHAZINE HCL 25 MG/ML IJ SOLN
INTRAMUSCULAR | Status: AC
  Filled 2019-09-14: qty 1

## 2019-09-14 MED ORDER — TRIAMCINOLONE ACETONIDE 40 MG/ML IJ SUSP
INTRAMUSCULAR | Status: DC | PRN
  Administered 2019-09-14: 80 mg via INTRAMUSCULAR

## 2019-09-14 MED ORDER — BRIMONIDINE TARTRATE 0.2 % OP SOLN
OPHTHALMIC | Status: AC
  Filled 2019-09-14: qty 5

## 2019-09-14 MED ORDER — STERILE WATER FOR INJECTION IJ SOLN
INTRAMUSCULAR | Status: DC | PRN
  Administered 2019-09-14: 20 mL

## 2019-09-14 MED ORDER — MIDAZOLAM HCL 2 MG/2ML IJ SOLN
INTRAMUSCULAR | Status: AC
  Filled 2019-09-14: qty 2

## 2019-09-14 MED ORDER — NA CHONDROIT SULF-NA HYALURON 40-30 MG/ML IO SOLN
INTRAOCULAR | Status: AC
  Filled 2019-09-14: qty 0.5

## 2019-09-14 MED ORDER — EPINEPHRINE PF 1 MG/ML IJ SOLN
INTRAMUSCULAR | Status: AC
  Filled 2019-09-14: qty 1

## 2019-09-14 MED ORDER — INDOCYANINE GREEN 25 MG IV SOLR
INTRAVENOUS | Status: AC
  Filled 2019-09-14: qty 25

## 2019-09-14 MED ORDER — SODIUM CHLORIDE 0.9 % IV SOLN
INTRAVENOUS | Status: DC | PRN
  Administered 2019-09-14: 25 ug/min via INTRAVENOUS

## 2019-09-14 MED ORDER — ATROPINE SULFATE 1 % OP SOLN
1.0000 [drp] | OPHTHALMIC | Status: AC | PRN
  Administered 2019-09-14 (×3): 1 [drp] via OPHTHALMIC
  Filled 2019-09-14: qty 5

## 2019-09-14 MED ORDER — SODIUM CHLORIDE 0.9 % IV SOLN
INTRAVENOUS | Status: DC
  Administered 2019-09-14 (×2): via INTRAVENOUS

## 2019-09-14 MED ORDER — BSS PLUS IO SOLN
INTRAOCULAR | Status: AC
  Filled 2019-09-14: qty 500

## 2019-09-14 MED ORDER — DEXAMETHASONE SODIUM PHOSPHATE 10 MG/ML IJ SOLN
INTRAMUSCULAR | Status: AC
  Filled 2019-09-14: qty 2

## 2019-09-14 MED ORDER — EPHEDRINE 5 MG/ML INJ
INTRAVENOUS | Status: AC
  Filled 2019-09-14: qty 10

## 2019-09-14 MED ORDER — STERILE WATER FOR INJECTION IJ SOLN
INTRAMUSCULAR | Status: AC
  Filled 2019-09-14: qty 10

## 2019-09-14 SURGICAL SUPPLY — 74 items
APL SWBSTK 6 STRL LF DISP (MISCELLANEOUS) ×4
APPLICATOR COTTON TIP 6 STRL (MISCELLANEOUS) ×4 IMPLANT
APPLICATOR COTTON TIP 6IN STRL (MISCELLANEOUS) ×8
BALL CTTN LRG ABS STRL LF (GAUZE/BANDAGES/DRESSINGS) ×3
BAND WRIST GAS GREEN (MISCELLANEOUS) IMPLANT
BLADE EYE CATARACT 19 1.4 BEAV (BLADE) IMPLANT
BNDG EYE OVAL (GAUZE/BANDAGES/DRESSINGS) ×2 IMPLANT
CABLE BIPOLOR RESECTION CORD (MISCELLANEOUS) ×2 IMPLANT
CANNULA ANT CHAM MAIN (OPHTHALMIC RELATED) IMPLANT
CANNULA DUALBORE 25G (CANNULA) ×1 IMPLANT
CANNULA FLEX TIP 25G (CANNULA) ×2 IMPLANT
CANNULA TROCAR 23 GA VLV (OPHTHALMIC) IMPLANT
CANNULA TROCAR 23G VLV (OPHTHALMIC) IMPLANT
CANNULA VLV SOFT TIP 25G (OPHTHALMIC) IMPLANT
CANNULA VLV SOFT TIP 25GA (OPHTHALMIC) IMPLANT
CLSR STERI-STRIP ANTIMIC 1/2X4 (GAUZE/BANDAGES/DRESSINGS) ×2 IMPLANT
COTTONBALL LRG STERILE PKG (GAUZE/BANDAGES/DRESSINGS) ×6 IMPLANT
COVER WAND RF STERILE (DRAPES) ×2 IMPLANT
DRAPE MICROSCOPE LEICA 46X105 (MISCELLANEOUS) ×2 IMPLANT
DRAPE OPHTHALMIC 77X100 STRL (CUSTOM PROCEDURE TRAY) ×2 IMPLANT
ERASER HMR WETFIELD 23G BP (MISCELLANEOUS) IMPLANT
FILTER BLUE MILLIPORE (MISCELLANEOUS) IMPLANT
FILTER STRAW FLUID ASPIR (MISCELLANEOUS) IMPLANT
FORCEPS GRIESHABER ILM 25G A (INSTRUMENTS) IMPLANT
FORCEPS GRIESHABER MAX 25G (MISCELLANEOUS) ×1 IMPLANT
GAS AUTO FILL CONSTEL (OPHTHALMIC)
GAS AUTO FILL CONSTELLATION (OPHTHALMIC) IMPLANT
GAS WRIST BAND GREEN (MISCELLANEOUS)
GLOVE BIO SURGEON STRL SZ7.5 (GLOVE) ×4 IMPLANT
GLOVE BIOGEL M 7.0 STRL (GLOVE) ×2 IMPLANT
GOWN STRL REUS W/ TWL LRG LVL3 (GOWN DISPOSABLE) ×2 IMPLANT
GOWN STRL REUS W/ TWL XL LVL3 (GOWN DISPOSABLE) ×1 IMPLANT
GOWN STRL REUS W/TWL LRG LVL3 (GOWN DISPOSABLE) ×4
GOWN STRL REUS W/TWL XL LVL3 (GOWN DISPOSABLE) ×2
KIT BASIN OR (CUSTOM PROCEDURE TRAY) ×2 IMPLANT
KIT PERFLUORON PROCEDURE 5ML (MISCELLANEOUS) ×1 IMPLANT
LENS MACULAR ASPHERIC CONSTEL (OPHTHALMIC) IMPLANT
LENS VITRECTOMY FLAT OCLR DISP (MISCELLANEOUS) IMPLANT
LOOP FINESSE 25 GA (MISCELLANEOUS) IMPLANT
MICROPICK 25G (MISCELLANEOUS)
NDL 18GX1X1/2 (RX/OR ONLY) (NEEDLE) ×1 IMPLANT
NDL 25GX 5/8IN NON SAFETY (NEEDLE) ×3 IMPLANT
NDL HYPO 30X.5 LL (NEEDLE) ×2 IMPLANT
NDL PRECISIONGLIDE 27X1.5 (NEEDLE) IMPLANT
NEEDLE 18GX1X1/2 (RX/OR ONLY) (NEEDLE) ×2 IMPLANT
NEEDLE 25GX 5/8IN NON SAFETY (NEEDLE) ×6 IMPLANT
NEEDLE HYPO 30X.5 LL (NEEDLE) ×4 IMPLANT
NEEDLE PRECISIONGLIDE 27X1.5 (NEEDLE) IMPLANT
NS IRRIG 1000ML POUR BTL (IV SOLUTION) ×2 IMPLANT
OIL SILICONE OPHTHALMIC 1000 (Ophthalmic Related) ×1 IMPLANT
PACK VITRECTOMY CUSTOM (CUSTOM PROCEDURE TRAY) ×2 IMPLANT
PAD ARMBOARD 7.5X6 YLW CONV (MISCELLANEOUS) ×4 IMPLANT
PAK PIK VITRECTOMY CVS 25GA (OPHTHALMIC) ×2 IMPLANT
PENCIL BIPOLAR 25GA STR DISP (OPHTHALMIC RELATED) ×2 IMPLANT
PICK MICROPICK 25G (MISCELLANEOUS) IMPLANT
PROBE DIATHERMY DSP 27GA (MISCELLANEOUS) IMPLANT
PROBE ENDO DIATHERMY 25G (MISCELLANEOUS) IMPLANT
PROBE LASER ILLUM FLEX CVD 25G (OPHTHALMIC) IMPLANT
REPL STRA BRUSH NDL (NEEDLE) IMPLANT
REPL STRA BRUSH NEEDLE (NEEDLE) IMPLANT
RESERVOIR BACK FLUSH (MISCELLANEOUS) IMPLANT
RETRACTOR IRIS FLEX 25G GRIESH (INSTRUMENTS) IMPLANT
SET INJECTOR OIL FLUID CONSTEL (OPHTHALMIC) ×1 IMPLANT
SPONGE SURGIFOAM ABS GEL 12-7 (HEMOSTASIS) IMPLANT
STOPCOCK 4 WAY LG BORE MALE ST (IV SETS) IMPLANT
SUT VICRYL 7 0 TG140 8 (SUTURE) ×2 IMPLANT
SYR 10ML LL (SYRINGE) ×2 IMPLANT
SYR 20ML LL LF (SYRINGE) ×3 IMPLANT
SYR 5ML LL (SYRINGE) ×2 IMPLANT
SYR BULB 3OZ (MISCELLANEOUS) ×2 IMPLANT
SYR TB 1ML LUER SLIP (SYRINGE) ×4 IMPLANT
TOWEL GREEN STERILE FF (TOWEL DISPOSABLE) ×2 IMPLANT
TUBING HIGH PRESS EXTEN 6IN (TUBING) ×2 IMPLANT
WATER STERILE IRR 1000ML POUR (IV SOLUTION) ×2 IMPLANT

## 2019-09-14 NOTE — Anesthesia Procedure Notes (Signed)
Procedure Name: Intubation Date/Time: 09/14/2019 12:02 PM Performed by: Candis Shine, CRNA Pre-anesthesia Checklist: Patient identified, Emergency Drugs available, Suction available and Patient being monitored Patient Re-evaluated:Patient Re-evaluated prior to induction Oxygen Delivery Method: Circle System Utilized Preoxygenation: Pre-oxygenation with 100% oxygen Induction Type: IV induction Ventilation: Mask ventilation without difficulty Laryngoscope Size: Mac and 3 Grade View: Grade I Tube type: Oral Tube size: 7.0 mm Number of attempts: 1 Airway Equipment and Method: Stylet Placement Confirmation: ETT inserted through vocal cords under direct vision,  positive ETCO2 and breath sounds checked- equal and bilateral Secured at: 23 cm Tube secured with: Tape Dental Injury: Teeth and Oropharynx as per pre-operative assessment

## 2019-09-14 NOTE — Discharge Instructions (Signed)
POSTOPERATIVE INSTRUCTIONS  Your doctor has performed vitreoretinal surgery on you at [facility]. [place] eye patched and shielded until seen by Dr. Coralyn Pear 9 AM tomorrow in clinic - Do not use drops until return - [place] - Sleep with belly down or on left side, avoid laying flat on back.    - No strenuous bending, stooping or lifting.  - You may not drive until further notice.  - If your doctor used a gas bubble in your eye during the procedure he will advise you on postoperative positioning. If you have a gas bubble you will be wearing a green bracelet that was applied in the operating room. The green bracelet should stay on as long as the gas bubble is in your eye. While the gas bubble is present you should not fly in an airplane. If you require general anesthesia while the gas bubble is present you must notify your anesthesiologist that an intraocular gas bubble is present so he can take the appropriate precautions.  - Tylenol or any other over-the-counter pain reliever can be used according to your doctor. If more pain medicine is required, your doctor will have a prescription for you.  - You may read, go up and down stairs, and watch television.     Bernarda Caffey, M.D., Ph.D.

## 2019-09-14 NOTE — Brief Op Note (Signed)
09/14/2019  3:08 PM  PATIENT:  Anne [name]  38 y.o. female  PRE-OPERATIVE DIAGNOSIS:  tractional retinal detachment, right eye  POST-OPERATIVE DIAGNOSIS:  tractional retinal detachment, right eye  PROCEDURE:  Procedure(s): REPAIR OF COMPLEX TRACTION RETINAL DETACHMENT (Right) Pars Plana Vitrectomy With 25 Gauge (Right) Photocoagulation With Laser (Right) Injection Of Silicone Oil (Right) Membrane Peel (Right)  SURGEON:  Surgeon(s) and Role:    Bernarda Caffey, MD - Primary  ASSISTANTS: Ernest Mallick, Ophthalmic Assistant  ANESTHESIA:   none  EBL:  5 mL   BLOOD ADMINISTERED:none  DRAINS: none   LOCAL MEDICATIONS USED:  NONE  SPECIMEN:  No Specimen  DISPOSITION OF SPECIMEN:  N/A  COUNTS:  YES  TOURNIQUET:  * No tourniquets in log *  DICTATION: .Note written in EPIC  PLAN OF CARE: Discharge to home after PACU  PATIENT DISPOSITION:  PACU - hemodynamically stable.   Delay start of Pharmacological VTE agent (>24hrs) due to surgical blood loss or risk of bleeding: yes

## 2019-09-14 NOTE — Interval H&P Note (Signed)
History and Physical Interval Note:  09/14/2019 11:35 AM  Anne [name]  has presented today for surgery, with the diagnosis of tractional retinal detachment, right eye.  The various methods of treatment have been discussed with the patient and family. After consideration of risks, benefits and other options for treatment, the patient has consented to  Procedure(s): [name] as a surgical intervention.  The patient's history has been reviewed, patient examined, no change in status, stable for surgery.  I have reviewed the patient's chart and labs.  Questions were answered to the patient's satisfaction.     Bernarda Caffey

## 2019-09-14 NOTE — Transfer of Care (Signed)
Immediate Anesthesia Transfer of Care Note  Patient: Anne [name]  Procedure(s) Performed: REPAIR OF COMPLEX TRACTION RETINAL DETACHMENT (Right Eye) Pars Plana Vitrectomy With 25 Gauge (Right Eye) Photocoagulation With Laser (Right Eye) Injection Of Silicone Oil (Right Eye) Membrane Peel (Right Eye)  Patient Location: PACU  Anesthesia Type:General  Level of Consciousness: awake, alert  and oriented  Airway & Oxygen Therapy: Patient Spontanous Breathing and Patient connected to nasal cannula oxygen  Post-op Assessment: Report given to RN and Post -op Vital signs reviewed and stable  Post vital signs: Reviewed and stable  Last Vitals:  Vitals Value Taken Time  BP    Temp    Pulse    Resp    SpO2      Last Pain:  Vitals:   09/14/19 0934  TempSrc: Oral  PainSc: 0-No pain      Patients Stated Pain Goal: 5 ([id number])  Complications: No apparent anesthesia complications

## 2019-09-14 NOTE — Op Note (Signed)
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 25 gauge Pars Plana Vitrectomy  CPT [id number] 2. Preretinal fibrosis/membrane and ILM Peeling 3. Perflurocarbon injeciton 4. Retinotomy 5. Fluid-Air Exchange 6. Laser Retinopexy 7. Injection 123XX123 cs silicon oil, Right Eye   Surgeon: Gardiner Sleeper, MD, PhD   Assistant:  Ernest Mallick, OA    Anesthesia: General   Estimated Blood Loss <1 cc   Complications: None   Brief history:   The patient has a history of proliferative diabetic retinopathy and developed progressive tractional fibrosis that has created a macula-involving tractional retinal detachment, OD. She has elected to proceed with surgical repair. The risks, benefits, and alternatives were explained to the patient, including pain, bleeding, infection, loss of vision, double vision, droopy eyelids, and need for more surgeries.  Informed consent was obtained from the patient and placed in the chart.     Procedure: Patient was seen in the Pre-operative area. After all remaining questions were answered, the operative "Right Eye" was marked and the informed consent was confirmed. The patient was brought back to the operating room by the nursing staff where general endotracheal anesthesia was induced. A documented time-out was performed. All in attendance (the nursing staff, anesthesia staff, and ophthalmology staff) agreed upon the patient, type of surgery, location of surgery, and patient allergies. The operative eye was then prepped and draped in the usual sterile ophthalmic fashion, followed by placement of a lid speculum.    A 25 gauge trocar was placed in the inferotemporal quadrant in a beveled fashion. The infusion line was brought to the operative field and found to be functional and  free of air bubbles. The infusion line was placed through this trocar and the infusion cannula was confirmed in the vitreous cavity with no incarceration of retina or choroid prior to turning it on.  The infusion was secured with steristrips. Two additional 25 gauge trocars were placed in the superonasal and superotemporal quadrants (10 and 2 oclock, respectively) in a similar beveled fashion.   The BIOM viewing system was then brought into place and the retina was visualized. Preretinal, fibrotic membranes were present posteriorly with the densest concentration being along the IT arcades inferotemporal to the fovea. This focal traction was creating a macula/fovea involving tractional RD. Kenalog and ICG were used to stain the vitreous and these preretinal membranes. Posterior membranes were carefully dissected and removed using a macular contact and ILM, MaxGrip forceps, the vitrectomy probe, and intraocular scissors. More peripheral membranes were peeled using the wide-field viewing system.  The membranes were very carefully stripped and peeled from the retinal surface with the assistance of kenalog and ICG staining. Of note, along the inferotemporal arcades, the membranes were very gummy and ingrained into the detached retina. ILM was stained with ICG and was peeled from the macula and extended inferiorly toward areas of PVR detachment, using ILM forceps and the light pipe, as much as was deemed safe.   Perflurocarbon was used to push the subretinal fluid anteriorly and flatten the macula. A small inferotemporal retinotomy was made to drain the anteriorly displaced SRF. Thick schleren was extracted through the retinotomy using a soft tipped extrusion cannula. Additional perfluorocarbon was used to flatten the retina after creation of the retinotomy. Using the lighted endolaser, near confluent laser was applied to the border of the retinotomy and fill  in PRP was performed posteriorly almost to arcades 360.  Air-fluid exchange was performed with drainage of all subretinal fluid using the soft tip cannula. Upon completion of these maneuvers, the retina flattened nicely. Additional fill in PRP laser was performed under air and residual fluid was removed using the soft tip cannula for a final time, leaving the retina attached and dry. [id number] cs silicon oil was then injected via the superotemporal trocar and filled up to the level of the lens plane with the aide of the venting cannula.   The three trocars were removed and sutured closed with7-0 vicryl sutures. The eye was confirmed to be at a physiologic level by digital palpation. Subconjunctival injections of antibiotic and kenalog were administered. The lid speculum and drapes were removed. Drops of an antibiotic, anti-ocular hypertensives and steroid were given. The eye was patched and shielded. The patient tolerated the procedure well without any intraoperative or immediate postoperative complications. The patient was taken to the recovery room in good condition to be discharged home. The patient was instructed to maintain a strict face-down position and will be seen by Dr. Coralyn Pear tomorrow morning.

## 2019-09-15 ENCOUNTER — Encounter (INDEPENDENT_AMBULATORY_CARE_PROVIDER_SITE_OTHER): Payer: Medicaid Other | Admitting: Ophthalmology

## 2019-09-15 ENCOUNTER — Ambulatory Visit (INDEPENDENT_AMBULATORY_CARE_PROVIDER_SITE_OTHER): Payer: Medicaid Other | Admitting: Ophthalmology

## 2019-09-15 ENCOUNTER — Encounter (INDEPENDENT_AMBULATORY_CARE_PROVIDER_SITE_OTHER): Payer: Self-pay | Admitting: Ophthalmology

## 2019-09-15 DIAGNOSIS — H25813 Combined forms of age-related cataract, bilateral: Secondary | ICD-10-CM

## 2019-09-15 DIAGNOSIS — H35033 Hypertensive retinopathy, bilateral: Secondary | ICD-10-CM

## 2019-09-15 DIAGNOSIS — E113523 Type 2 diabetes mellitus with proliferative diabetic retinopathy with traction retinal detachment involving the macula, bilateral: Secondary | ICD-10-CM

## 2019-09-15 DIAGNOSIS — H3581 Retinal edema: Secondary | ICD-10-CM

## 2019-09-15 DIAGNOSIS — I10 Essential (primary) hypertension: Secondary | ICD-10-CM

## 2019-09-15 NOTE — Anesthesia Postprocedure Evaluation (Signed)
Anesthesia Post Note  Patient: Anne [name]  Procedure(s) Performed: REPAIR OF COMPLEX TRACTION RETINAL DETACHMENT (Right Eye) Pars Plana Vitrectomy With 25 Gauge (Right Eye) Photocoagulation With Laser (Right Eye) Injection Of Silicone Oil (Right Eye) Membrane Peel (Right Eye)     Patient location during evaluation: PACU Anesthesia Type: General Level of consciousness: sedated and patient cooperative Pain management: pain level controlled Vital Signs Assessment: post-procedure vital signs reviewed and stable Respiratory status: spontaneous breathing Cardiovascular status: stable Anesthetic complications: no    Last Vitals:  Vitals:   09/14/19 1629 09/14/19 1632  BP: 130/78   Pulse: 81   Resp: (!) 22   Temp:  37.1 C  SpO2: 99%     Last Pain:  Vitals:   09/14/19 1613  TempSrc:   PainSc: Tome

## 2019-09-17 ENCOUNTER — Encounter (INDEPENDENT_AMBULATORY_CARE_PROVIDER_SITE_OTHER): Payer: Self-pay | Admitting: Ophthalmology

## 2019-09-20 NOTE — Progress Notes (Signed)
[facility] Note  09/22/2019     CHIEF COMPLAINT Patient presents for Post-op Follow-up   HISTORY OF PRESENT ILLNESS: Anne [name] is a 38 y.o. female who presents to the clinic today for:   HPI    Post-op Follow-up    In right eye.  Vision is improved.  I, the attending physician,  performed the HPI with the patient and updated documentation appropriately.          Comments    Patient states vision is improving OD.  Still has redness OD.  Denies eye pain but has had a headache over right eye.       Last edited by Bernarda Caffey, MD on 09/22/2019  2:13 PM. (History)    pt states this past week has been "interesting" she states she is maintaining face down positioning 30 mins/hr  Referring physician: Burt Ek, [place],  [place] [id number]  HISTORICAL INFORMATION:   Selected notes from the MEDICAL RECORD NUMBER Referred by Dr. Stevenson Clinch for concern of VH / TRD OS LEE: 08.27.20 (E. [place]) [BCVA: OD: "blurry" OS" NLP] Ocular Hx- PMH-DM (a1c: 7.0, takes invokana, lantus, novolog, metformin), HLD, HTN   CURRENT MEDICATIONS: Current Outpatient Medications (Ophthalmic Drugs)  Medication Sig  . bacitracin-polymyxin b (POLYSPORIN) ophthalmic ointment Place into the right eye 4 (four) times daily. Place a 1/2 inch ribbon of ointment into the lower eyelid.  . prednisoLONE acetate (PRED FORTE) 1 % ophthalmic suspension Place 1 drop into the right eye 4 (four) times daily.   No current facility-administered medications for this visit.  (Ophthalmic Drugs)   Current Outpatient Medications (Other)  Medication Sig  . albuterol (VENTOLIN HFA) 108 (90 Base) MCG/ACT inhaler Inhale 2 puffs into the lungs every 6 (six) hours as needed for wheezing or shortness of breath.  Marland Kitchen aspirin (GNP ASPIRIN LOW DOSE) 81 MG EC tablet Take 1 tablet (81 mg total) by mouth daily with breakfast. Swallow whole. (Patient taking differently:  Take 81 mg by mouth daily with breakfast. )  . atorvastatin (LIPITOR) 80 MG tablet Take 1 tablet (80 mg total) by mouth every evening.  . carvedilol (COREG) 6.25 MG tablet Take 1.5 tablets (9.375 mg total) by mouth 2 (two) times daily.  . furosemide (LASIX) 40 MG tablet Take 1 tablet (40 mg total) by mouth 2 (two) times daily.  Marland Kitchen gabapentin (NEURONTIN) 300 MG capsule Take 300 mg by mouth 3 (three) times daily as needed (for neuropathy pain).   . isosorbide mononitrate (IMDUR) 30 MG 24 hr tablet Take 1 tablet (30 mg total) by mouth daily.  Marland Kitchen LANTUS SOLOSTAR 100 UNIT/ML Solostar Pen INNJECT 50 UNITS S.Q. ONCE DAILY AT 10 P.M. (Patient taking differently: Inject 50 Units into the skin at bedtime. )  . losartan (COZAAR) 25 MG tablet Take 1 tablet (25 mg total) by mouth every evening. (Patient taking differently: Take 50 mg by mouth every evening. )  . metFORMIN (GLUCOPHAGE) 500 MG tablet TAKE 1 TABLET BY MOUTH TWICE DAILY WITH MEALS. (Patient taking differently: Take 1,000 mg by mouth 2 (two) times daily with a meal. )  . metoCLOPramide (REGLAN) 10 MG/10ML SOLN Take 10 mLs (10 mg total) by mouth 3 (three) times daily. (Patient taking differently: Take 5 mg by mouth 2 (two) times daily. )  . neomycin-bacitracin-polymyxin (NEOSPORIN) ointment Apply 1 application topically daily.  . nitroGLYCERIN (NITROSTAT) 0.4 MG SL tablet Place 1 tablet (0.4 mg total) under  the tongue every 5 (five) minutes as needed.  Marland Kitchen NOVOLOG FLEXPEN 100 UNIT/ML FlexPen INJECT 12-18 UNITS S.Q. THREE TIMES DAILY WITH MEALS. (Patient taking differently: Inject 12-22 Units into the skin 3 (three) times daily. Per MD sliding scale for blood sugars over 150)  . ondansetron (ZOFRAN ODT) 4 MG disintegrating tablet Take 1 tablet (4 mg total) by mouth every 8 (eight) hours as needed for nausea or vomiting.  Marland Kitchen oxyCODONE-acetaminophen (PERCOCET/ROXICET) 5-325 MG tablet Take 1 tablet by mouth every 4 (four) hours as needed. (Patient taking  differently: Take 1 tablet by mouth every 4 (four) hours as needed for moderate pain. )  . potassium chloride (K-DUR) 10 MEQ tablet Take 1 tablet (10 mEq total) by mouth 2 (two) times daily.  . promethazine (PHENERGAN) 12.5 MG tablet Take 1 tablet (12.5 mg total) by mouth every 6 (six) hours as needed for nausea or vomiting.  . sertraline (ZOLOFT) 100 MG tablet Take 200 mg by mouth at bedtime.   Marland Kitchen spironolactone (ALDACTONE) 25 MG tablet Take 0.5 tablets (12.5 mg total) by mouth daily.  . ticagrelor ([name]) 90 MG TABS tablet Take 1 tablet (90 mg total) by mouth 2 (two) times daily.   No current facility-administered medications for this visit.  (Other)      REVIEW OF SYSTEMS: ROS    Positive for: Endocrine, Eyes   Negative for: Constitutional, Gastrointestinal, Neurological, Skin, Genitourinary, Musculoskeletal, HENT, Cardiovascular, Respiratory, Psychiatric, Allergic/Imm, Heme/Lymph   Last edited by Doneen Poisson on 09/22/2019  1:37 PM. (History)       ALLERGIES Allergies  Allergen Reactions  . Canagliflozin Other (See Comments)    Vaginal yeast infections  . Nsaids Other (See Comments)    Yeast infection     PAST MEDICAL HISTORY Past Medical History:  Diagnosis Date  . Acid reflux   . Anxiety   . Arthritis   . 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 CAD (coronary artery disease) 11/13/2018   Late presentation anterior MI 12/19 >> LHC - dLM 25, mLAD 99, oOM2 100 (R-L collats), irreg RCA, EF 25-35 >> PCI: POBA to mLAD  . Chronic systolic CHF (congestive heart failure) ([place]) 11/28/2018   Ischemic CM // late presentation ant MI 10/2018 tx with POBA to LAD (residual CAD with CTO of the OM2) // Echo 12/19:  No mural apical thrombus, septal, apical mid ant and inf HK; mild LVH, EF 30-35, mild MR // Echo 01/2019: EF 30-35, Gr 1 DD, diff HK, apical AK, mild MR   . CKD (chronic kidney disease), stage I   . Diabetes mellitus type 1 ([place])   . Dyspnea    . Former tobacco use   . Gastroparesis   . Hypercholesteremia   . Hypertension   . Myocardial infarction ([place]) 2019  . Osteomyelitis ([place])    a. s/p R forefoot amputation.  . Renal failure   . Sciatica    Past Surgical History:  Procedure Laterality Date  . AMPUTATION Right 11/03/2011   Procedure: AMPUTATION RAY;  Surgeon: Jamesetta So;  Location: [facility];  Service: General;  Laterality: Right;  Right fourth and fifth metatarsal   . APPENDECTOMY    . CARDIAC CATHETERIZATION  10/2018  . CESAREAN SECTION  2004 and 2007   x2  . CORONARY/GRAFT ACUTE MI REVASCULARIZATION N/A 11/11/2018   Procedure: CORONARY/GRAFT ACUTE MI REVASCULARIZATION;  Surgeon: Jettie Booze, MD;  Location: [facility];  Service:  Cardiovascular;  Laterality: N/A;  . FRACTURE SURGERY  2000  . INJECTION OF SILICONE OIL Right [id number]   Procedure: Injection Of Silicone Oil;  Surgeon: Bernarda Caffey, MD;  Location: [facility];  Service: Ophthalmology;  Laterality: Right;  . LEFT HEART CATH AND CORONARY ANGIOGRAPHY N/A 11/11/2018   Procedure: LEFT HEART CATH AND CORONARY ANGIOGRAPHY;  Surgeon: Jettie Booze, MD;  Location: [facility];  Service: Cardiovascular;  Laterality: N/A;  . MEMBRANE PEEL Right 09/14/2019   Procedure: Antoine Primas;  Surgeon: Bernarda Caffey, MD;  Location: [facility];  Service: Ophthalmology;  Laterality: Right;  . PARS PLANA VITRECTOMY Right 09/14/2019   Procedure: [place] Vitrectomy With 25 Gauge;  Surgeon: Bernarda Caffey, MD;  Location: [facility];  Service: Ophthalmology;  Laterality: Right;  . PHOTOCOAGULATION WITH LASER Right 09/14/2019   Procedure: Photocoagulation With Laser;  Surgeon: Bernarda Caffey, MD;  Location: [facility];  Service: Ophthalmology;  Laterality: Right;  . 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   Procedure: REPAIR OF COMPLEX TRACTION RETINAL DETACHMENT;  Surgeon: Bernarda Caffey, MD;  Location: [facility];  Service: Ophthalmology;   Laterality: Right;  . TUBAL LIGATION      FAMILY HISTORY Family History  Problem Relation Age of Onset  . Diabetes Father   . Lung cancer Father   . Alcoholism Father   . Asthma Mother   . Kidney disease Mother   . Anemia Mother        hemolytic  . Hypertension Mother   . Heart attack Paternal Grandmother   . Diabetes Paternal Grandmother   . Diabetes Paternal Grandfather   . Anesthesia problems Neg Hx   . Hypotension Neg Hx   . Malignant hyperthermia Neg Hx   . Pseudochol deficiency Neg Hx     SOCIAL HISTORY Social History   Tobacco Use  . Smoking status: Former Smoker    Packs/day: 0.25    Years: 20.00    Pack years: 5.00    Types: Cigarettes    Quit date: 12/29/2013    Years since quitting: 5.7  . Smokeless tobacco: Never Used  Substance Use Topics  . Alcohol use: No  . Drug use: No         OPHTHALMIC EXAM:  Base Eye Exam    Visual Acuity (Snellen - Linear)      Right Left   Dist [place] 20/300 -1 HM   Dist ph [place] 20/250 -1 NI       Tonometry (Tonopen, 1:44 PM)      Right Left   Pressure 27 13       Pupils      Dark Light Shape React APD   Right 6 5 Round Brisk 0   Left 4 3 Round Brisk 0       Visual Fields      Left Right   Restrictions Total superior temporal, inferior temporal, superior nasal, inferior nasal deficiencies Partial outer superior nasal, inferior nasal deficiencies       Extraocular Movement      Right Left    Full Full       Neuro/Psych    Oriented x3: Yes   Mood/Affect: Normal       Dilation    Both eyes: 1.0% Mydriacyl, 2.5% Phenylephrine @ 1:44 PM        Slit Lamp and Fundus Exam    Slit Lamp Exam      Right Left   Lids/Lashes Periorbital edema - improving Dermatochalasis -  upper lid, Meibomian gland dysfunction   Conjunctiva/Sclera Subconjunctival hemorrhage - improving, sutures intact White and quiet   Cornea 3+ Punctate epithelial erosions, irregular epi Clear   Anterior Chamber Deep and quiet Deep and quiet    Iris Round and dilated, No NVI Round and dilated, No NVI   Lens 1+ Nuclear sclerosis, 1+ Cortical cataract 1+ Nuclear sclerosis, 1+ Cortical cataract   Vitreous post vitrectomy; good silicon oil fill Vitreous syneresis       Fundus Exam      Right Left   Disc Pink and Sharp, NVD - regressed, fibrotic NVE superior to nerve -- improved obscured by fibrosis   C/D Ratio 0.5 no view   Macula Flat under oil, fibrosis improved, shallow residual SRF inferiorly -- improving Closed wolf-jaw TRD with macular hole   Vessels Vascular attenuation, Tortuous, +fibrotic NVE superior to nerve and ST arcades 4+ fibrotic NVD w/ TRD   Periphery Improvement in tractional fibrosis at 0100 above disc and along arcades, shallow SRF improving; scattered IRH and exudate, 360 PRP with good posterior fill in scattered PRP, almost total detachment -- extension of central/posterior TRD        Refraction    Wearing Rx      Sphere Cylinder   Right -6.75 Sphere   Left -5.75 Sphere          IMAGING AND PROCEDURES  Imaging and Procedures for @TODAY @  OCT, Retina - OU - Both Eyes       Right Eye Quality was good. Central Foveal Thickness: 536. Progression has been stable. Findings include abnormal foveal contour, subretinal fluid, epiretinal membrane, intraretinal hyper-reflective material, intraretinal fluid (Interval removal of tractional fibrosis and pucker; overall, interval improvement in TRD -- traction removed -- but +residual SRF).   Left Eye Quality was good. Progression has been stable. Findings include abnormal foveal contour, subretinal fluid, preretinal fibrosis, epiretinal membrane, vitreous traction.   Notes *Images captured and stored on drive  Diagnosis / Impression:  OD: TRD involving inferior macula -- Interval removal of tractional fibrosis and improved pucker; overall, interval improvement in TRD -- traction removed -- but +residual SRF OS: total macula-involving TRD  Clinical  management:  See below  Abbreviations: NFP - Normal foveal profile. CME - cystoid macular edema. PED - pigment epithelial detachment. IRF - intraretinal fluid. SRF - subretinal fluid. EZ - ellipsoid zone. ERM - epiretinal membrane. ORA - outer retinal atrophy. ORT - outer retinal tubulation. SRHM - subretinal hyper-reflective material                 ASSESSMENT/PLAN:    ICD-10-CM   1. 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 Retinal edema  H35.81 OCT, Retina - OU - Both Eyes  3. Essential hypertension  I10   4. Hypertensive retinopathy of both eyes  H35.033   5. Combined forms of age-related cataract of both eyes  H25.813     1,2. 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)  - formerly managed at [facility] -- s/p PRP OU -- last visit in 2017  - extensive history of severe disease and medical noncompliance  - exam shows and OCT confirms TRD OU -- OD with inf macula and foveal involvement; OS with closed wolf-jaw total TRD with macular hole  - discussed findings and very poor prognosis given chronicity of problems  - BCVA 20/250 (PH) from  CF OU  - S/P IVA #1 OD (10.16.20)  - now POW1 s/p PPV/MP/EL/FAX/14% C3F8 OD, 10.22.20             - did well this week             - fibrosis improved and retina flattening under oil  - OCT shows persistent shallow SRF inferiorl              - IOP elevated at 27             - cont   PF 4x/day OD                          zymaxid QID OD -- stop when bottle is empty                         Atropine BID OD -- stop when bottle is empty   add Cosopt BID OD                         Brimonidine BID OD                          PSO ung QID OD              - avoid laying flat on back              - eye shield when sleeping x1 more week             - post op drop and positioning instructions  reviewed              - tylenol for pain   - f/u 2 weeks, DFE, OCT  3,4. Hypertensive retinopathy OU  - discussed importance of tight BP control  - monitor  5. 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  - discussed diagnosis and progression  - not yet visually significant  - monitor for now   Ophthalmic Meds Ordered this visit:  Meds ordered this encounter  Medications  . prednisoLONE acetate (PRED FORTE) 1 % ophthalmic suspension    Sig: Place 1 drop into the right eye 4 (four) times daily.    Dispense:  15 mL    Refill:  0  . bacitracin-polymyxin b (POLYSPORIN) ophthalmic ointment    Sig: Place into the right eye 4 (four) times daily. Place a 1/2 inch ribbon of ointment into the lower eyelid.    Dispense:  3.5 g    Refill:  3       Return in about 2 weeks (around 10/06/2019) for f/u PDR / TRD OD, DFE, OCT.  There are no Patient Instructions on file for this visit.   Explained the diagnoses, plan, and follow up with the patient and they expressed understanding.  Patient expressed understanding of the importance of proper follow up care.   This document serves as a record of services personally performed by Gardiner Sleeper, MD, PhD. It was created on their behalf by Ernest Mallick, OA, an ophthalmic assistant. The creation of this record is the provider's dictation and/or activities during the visit.    Electronically signed by: Ernest Mallick, OA 10.28.2020 1:25 PM   Gardiner Sleeper, M.D., Ph.D. Diseases & Surgery of the Retina and Vitreous  [place]  I have reviewed the above documentation for accuracy and completeness, and I agree with the above. Gardiner Sleeper, M.D., Ph.D. 09/23/19 1:27 PM    Abbreviations: M myopia (nearsighted); A astigmatism; H hyperopia (farsighted); P presbyopia; Mrx spectacle prescription;  CTL contact lenses; OD right eye;  OS left eye; OU both eyes  XT exotropia; ET esotropia; PEK punctate epithelial keratitis; PEE punctate epithelial erosions; DES dry eye syndrome; MGD meibomian gland dysfunction; ATs artificial tears; PFAT's preservative free artificial tears; [place] nuclear sclerotic cataract; PSC posterior subcapsular cataract; ERM epi-retinal membrane; PVD posterior vitreous detachment; RD retinal detachment; DM diabetes mellitus; DR diabetic retinopathy; NPDR non-proliferative diabetic retinopathy; PDR proliferative diabetic retinopathy; CSME clinically significant macular edema; DME diabetic macular edema; dbh dot blot hemorrhages; CWS cotton wool spot; POAG primary open angle glaucoma; C/D cup-to-disc ratio; HVF humphrey visual field; GVF goldmann visual field; OCT optical coherence tomography; IOP intraocular pressure; BRVO Branch retinal vein occlusion; CRVO central retinal vein occlusion; CRAO central retinal artery occlusion; BRAO branch retinal artery occlusion; RT retinal tear; SB scleral buckle; PPV pars plana vitrectomy; VH Vitreous hemorrhage; PRP panretinal laser photocoagulation; IVK intravitreal kenalog; VMT vitreomacular traction; MH Macular hole;  NVD neovascularization of the disc; NVE neovascularization elsewhere; AREDS age related eye disease study; ARMD age related macular degeneration; POAG primary open angle glaucoma; EBMD epithelial/anterior basement membrane dystrophy; ACIOL anterior chamber intraocular lens; IOL intraocular lens; PCIOL posterior chamber intraocular lens; Phaco/IOL phacoemulsification with intraocular lens placement; [place] photorefractive keratectomy; LASIK laser assisted in situ keratomileusis; HTN hypertension; DM diabetes mellitus; COPD chronic obstructive pulmonary disease

## 2019-09-22 ENCOUNTER — Encounter (INDEPENDENT_AMBULATORY_CARE_PROVIDER_SITE_OTHER): Payer: Self-pay | Admitting: Ophthalmology

## 2019-09-22 ENCOUNTER — Ambulatory Visit (INDEPENDENT_AMBULATORY_CARE_PROVIDER_SITE_OTHER): Payer: Medicaid Other | Admitting: Ophthalmology

## 2019-09-22 DIAGNOSIS — H3581 Retinal edema: Secondary | ICD-10-CM

## 2019-09-22 DIAGNOSIS — H35033 Hypertensive retinopathy, bilateral: Secondary | ICD-10-CM

## 2019-09-22 DIAGNOSIS — I10 Essential (primary) hypertension: Secondary | ICD-10-CM

## 2019-09-22 DIAGNOSIS — H25813 Combined forms of age-related cataract, bilateral: Secondary | ICD-10-CM

## 2019-09-22 DIAGNOSIS — E113523 Type 2 diabetes mellitus with proliferative diabetic retinopathy with traction retinal detachment involving the macula, bilateral: Secondary | ICD-10-CM

## 2019-09-22 MED ORDER — PREDNISOLONE ACETATE 1 % OP SUSP: 1.0000 [drp] | Freq: Four times a day (QID) | OPHTHALMIC | 0 refills | Status: DC

## 2019-09-22 MED ORDER — BACITRACIN-POLYMYXIN B 500-10000 UNIT/GM OP OINT: TOPICAL_OINTMENT | Freq: Four times a day (QID) | OPHTHALMIC | 3 refills | Status: DC

## 2019-10-03 NOTE — Progress Notes (Signed)
[facility] Note  10/06/2019     CHIEF COMPLAINT Patient presents for Retina Follow Up   HISTORY OF PRESENT ILLNESS: Anne [name] is a 38 y.o. female who presents to the clinic today for:   HPI    Retina Follow Up    In both eyes.  Severity is severe.  Duration of 2 weeks.  Since onset it is gradually improving.  I, the attending physician,  performed the HPI with the patient and updated documentation appropriately.          Comments    Patient states can see more colors. Distance vision seems better OD. BS was 140 this am. Last a1c unknown. Using Pred Forte qid OD, cosopt and brimonidine bid OD and PSO ung qid OD.       Last edited by Bernarda Caffey, MD on 10/06/2019  2:36 PM. (History)    pt states she is doing well, she feels like colors are brighter  Referring physician: Burt Ek, [place],  [place] [address]  HISTORICAL INFORMATION:   Selected notes from the MEDICAL RECORD NUMBER Referred by Dr. Stevenson Clinch for concern of VH / TRD OS LEE: 08.27.20 (E. [place]) [BCVA: OD: "blurry" OS" NLP] Ocular Hx- PMH-DM (a1c: 7.0, takes invokana, lantus, novolog, metformin), HLD, HTN   CURRENT MEDICATIONS: Current Outpatient Medications (Ophthalmic Drugs)  Medication Sig  . bacitracin-polymyxin b (POLYSPORIN) ophthalmic ointment Place into the right eye 4 (four) times daily. Place a 1/2 inch ribbon of ointment into the lower eyelid.  Marland Kitchen brimonidine (ALPHAGAN) 0.2 % ophthalmic solution Place 1 drop into the right eye 2 (two) times daily.  . dorzolamide-timolol (COSOPT) 22.3-6.8 MG/ML ophthalmic solution Place 1 drop into the right eye 2 (two) times daily.  . prednisoLONE acetate (PRED FORTE) 1 % ophthalmic suspension Place 1 drop into the right eye 4 (four) times daily.   No current facility-administered medications for this visit.  (Ophthalmic Drugs)   Current Outpatient Medications (Other)  Medication Sig  .  albuterol (VENTOLIN HFA) 108 (90 Base) MCG/ACT inhaler Inhale 2 puffs into the lungs every 6 (six) hours as needed for wheezing or shortness of breath.  Marland Kitchen aspirin (GNP ASPIRIN LOW DOSE) 81 MG EC tablet Take 1 tablet (81 mg total) by mouth daily with breakfast. Swallow whole. (Patient taking differently: Take 81 mg by mouth daily with breakfast. )  . atorvastatin (LIPITOR) 80 MG tablet Take 1 tablet (80 mg total) by mouth every evening.  . carvedilol (COREG) 6.25 MG tablet Take 1.5 tablets (9.375 mg total) by mouth 2 (two) times daily.  Marland Kitchen gabapentin (NEURONTIN) 300 MG capsule Take 300 mg by mouth 3 (three) times daily as needed (for neuropathy pain).   Marland Kitchen LANTUS SOLOSTAR 100 UNIT/ML Solostar Pen INNJECT 50 UNITS S.Q. ONCE DAILY AT 10 P.M. (Patient taking differently: Inject 50 Units into the skin at bedtime. )  . losartan (COZAAR) 25 MG tablet Take 1 tablet (25 mg total) by mouth every evening. (Patient taking differently: Take 50 mg by mouth every evening. )  . metFORMIN (GLUCOPHAGE) 500 MG tablet TAKE 1 TABLET BY MOUTH TWICE DAILY WITH MEALS. (Patient taking differently: Take 1,000 mg by mouth 2 (two) times daily with a meal. )  . metoCLOPramide (REGLAN) 10 MG/10ML SOLN Take 10 mLs (10 mg total) by mouth 3 (three) times daily. (Patient taking differently: Take 5 mg by mouth 2 (two) times daily. )  . neomycin-bacitracin-polymyxin (NEOSPORIN) ointment Apply  1 application topically daily.  . nitroGLYCERIN (NITROSTAT) 0.4 MG SL tablet Place 1 tablet (0.4 mg total) under the tongue every 5 (five) minutes as needed.  Marland Kitchen NOVOLOG FLEXPEN 100 UNIT/ML FlexPen INJECT 12-18 UNITS S.Q. THREE TIMES DAILY WITH MEALS. (Patient taking differently: Inject 12-22 Units into the skin 3 (three) times daily. Per MD sliding scale for blood sugars over 150)  . ondansetron (ZOFRAN ODT) 4 MG disintegrating tablet Take 1 tablet (4 mg total) by mouth every 8 (eight) hours as needed for nausea or vomiting.  Marland Kitchen oxyCODONE-acetaminophen  (PERCOCET/ROXICET) 5-325 MG tablet Take 1 tablet by mouth every 4 (four) hours as needed. (Patient taking differently: Take 1 tablet by mouth every 4 (four) hours as needed for moderate pain. )  . promethazine (PHENERGAN) 12.5 MG tablet Take 1 tablet (12.5 mg total) by mouth every 6 (six) hours as needed for nausea or vomiting.  . sertraline (ZOLOFT) 100 MG tablet Take 200 mg by mouth at bedtime.   Marland Kitchen spironolactone (ALDACTONE) 25 MG tablet Take 0.5 tablets (12.5 mg total) by mouth daily.  . ticagrelor ([name]) 90 MG TABS tablet Take 1 tablet (90 mg total) by mouth 2 (two) times daily.  . furosemide (LASIX) 40 MG tablet Take 1 tablet (40 mg total) by mouth 2 (two) times daily.  . isosorbide mononitrate (IMDUR) 30 MG 24 hr tablet Take 1 tablet (30 mg total) by mouth daily.  . potassium chloride (K-DUR) 10 MEQ tablet Take 1 tablet (10 mEq total) by mouth 2 (two) times daily.   No current facility-administered medications for this visit.  (Other)      REVIEW OF SYSTEMS: ROS    Positive for: Endocrine, Eyes   Negative for: Constitutional, Gastrointestinal, Neurological, Skin, Genitourinary, Musculoskeletal, HENT, Cardiovascular, Respiratory, Psychiatric, Allergic/Imm, Heme/Lymph   Last edited by Roselee Nova D, COT on 10/06/2019  1:45 PM. (History)       ALLERGIES Allergies  Allergen Reactions  . Canagliflozin Other (See Comments)    Vaginal yeast infections  . Nsaids Other (See Comments)    Yeast infection     PAST MEDICAL HISTORY Past Medical History:  Diagnosis Date  . Acid reflux   . Anxiety   . Arthritis   . 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 CAD (coronary artery disease) 11/13/2018   Late presentation anterior MI 12/19 >> LHC - dLM 25, mLAD 99, oOM2 100 (R-L collats), irreg RCA, EF 25-35 >> PCI: POBA to mLAD  . Chronic systolic CHF (congestive heart failure) ([place]) 11/28/2018   Ischemic CM // late presentation ant MI 10/2018 tx with POBA  to LAD (residual CAD with CTO of the OM2) // Echo 12/19:  No mural apical thrombus, septal, apical mid ant and inf HK; mild LVH, EF 30-35, mild MR // Echo 01/2019: EF 30-35, Gr 1 DD, diff HK, apical AK, mild MR   . CKD (chronic kidney disease), stage I   . Diabetes mellitus type 1 ([place])   . Dyspnea   . Former tobacco use   . Gastroparesis   . Hypercholesteremia   . Hypertension   . Myocardial infarction ([place]) 2019  . Osteomyelitis ([place])    a. s/p R forefoot amputation.  . Renal failure   . Sciatica    Past Surgical History:  Procedure Laterality Date  . AMPUTATION Right 11/03/2011   Procedure: AMPUTATION RAY;  Surgeon: Jamesetta So;  Location: [facility];  Service: General;  Laterality: Right;  Right fourth and fifth metatarsal   . APPENDECTOMY    . CARDIAC CATHETERIZATION  10/2018  . CESAREAN SECTION  2004 and 2007   x2  . CORONARY/GRAFT ACUTE MI REVASCULARIZATION N/A 11/11/2018   Procedure: CORONARY/GRAFT ACUTE MI REVASCULARIZATION;  Surgeon: Jettie Booze, MD;  Location: [facility];  Service: Cardiovascular;  Laterality: N/A;  . FRACTURE SURGERY  2000  . INJECTION OF SILICONE OIL Right [id number]   Procedure: Injection Of Silicone Oil;  Surgeon: Bernarda Caffey, MD;  Location: [facility];  Service: Ophthalmology;  Laterality: Right;  . LEFT HEART CATH AND CORONARY ANGIOGRAPHY N/A 11/11/2018   Procedure: LEFT HEART CATH AND CORONARY ANGIOGRAPHY;  Surgeon: Jettie Booze, MD;  Location: [facility];  Service: Cardiovascular;  Laterality: N/A;  . MEMBRANE PEEL Right 09/14/2019   Procedure: Antoine Primas;  Surgeon: Bernarda Caffey, MD;  Location: [facility];  Service: Ophthalmology;  Laterality: Right;  . PARS PLANA VITRECTOMY Right 09/14/2019   Procedure: [place] Vitrectomy With 25 Gauge;  Surgeon: Bernarda Caffey, MD;  Location: [facility];  Service: Ophthalmology;  Laterality: Right;  . PHOTOCOAGULATION WITH LASER Right 09/14/2019   Procedure: Photocoagulation With Laser;   Surgeon: Bernarda Caffey, MD;  Location: [facility];  Service: Ophthalmology;  Laterality: Right;  . 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   Procedure: REPAIR OF COMPLEX TRACTION RETINAL DETACHMENT;  Surgeon: Bernarda Caffey, MD;  Location: [facility];  Service: Ophthalmology;  Laterality: Right;  . TUBAL LIGATION      FAMILY HISTORY Family History  Problem Relation Age of Onset  . Diabetes Father   . Lung cancer Father   . Alcoholism Father   . Asthma Mother   . Kidney disease Mother   . Anemia Mother        hemolytic  . Hypertension Mother   . Heart attack Paternal Grandmother   . Diabetes Paternal Grandmother   . Diabetes Paternal Grandfather   . Anesthesia problems Neg Hx   . Hypotension Neg Hx   . Malignant hyperthermia Neg Hx   . Pseudochol deficiency Neg Hx     SOCIAL HISTORY Social History   Tobacco Use  . Smoking status: Former Smoker    Packs/day: 0.25    Years: 20.00    Pack years: 5.00    Types: Cigarettes    Quit date: 12/29/2013    Years since quitting: 5.7  . Smokeless tobacco: Never Used  Substance Use Topics  . Alcohol use: No  . Drug use: No         OPHTHALMIC EXAM:  Base Eye Exam    Visual Acuity (Snellen - Linear)      Right Left   Dist [place] 20/200 -2 CF @face    Dist ph [place] 20/150 -3 NI   Correction: Glasses       Tonometry (Tonopen, 1:52 PM)      Right Left   Pressure 12 13       Pupils      Dark Light Shape React APD   Right 6 6 Round Minimal None   Left 3 2 Round Slow None       Visual Fields (Counting fingers)      Left Right   Restrictions Total superior temporal, inferior temporal, superior nasal, inferior nasal deficiencies Partial outer superior nasal, inferior nasal deficiencies       Neuro/Psych    Oriented x3: Yes   Mood/Affect: Normal       Dilation  Both eyes: 1.0% Mydriacyl, 2.5% Phenylephrine @ 1:52 PM        Slit Lamp and Fundus Exam    Slit Lamp Exam      Right Left   Lids/Lashes  Periorbital edema - improving Dermatochalasis - upper lid, Meibomian gland dysfunction   Conjunctiva/Sclera Subconjunctival hemorrhage - improving, sutures dissolving White and quiet   Cornea 3+ Punctate epithelial erosions, irregular epi Clear   Anterior Chamber Deep and quiet Deep and quiet   Iris Round and dilated, No NVI Round and dilated, No NVI   Lens 1+ Nuclear sclerosis, 1+ Cortical cataract 1+ Nuclear sclerosis, 1+ Cortical cataract   Vitreous post vitrectomy; good silicon oil fill Vitreous syneresis       Fundus Exam      Right Left   Disc Pink and Sharp, NVD - regressed, fibrotic NVE superior to nerve -- improved obscured by fibrosis   C/D Ratio 0.5 no view   Macula Flat under oil, fibrosis improved, shallow residual SRF inferiorly -- improving Closed wolf-jaw TRD with macular hole   Vessels Vascular attenuation, Tortuous, +fibrotic NVE superior to nerve and ST arcades 4+ fibrotic NVD w/ TRD   Periphery Improvement in tractional fibrosis at 0100 above disc and along arcades, shallow SRF improving; scattered IRH and exudate, 360 PRP with good posterior fill in scattered PRP, almost total detachment -- extension of central/posterior TRD          IMAGING AND PROCEDURES  Imaging and Procedures for @TODAY @  OCT, Retina - OU - Both Eyes       Right Eye Quality was good. Central Foveal Thickness: 320. Progression has improved. Findings include abnormal foveal contour, subretinal fluid, epiretinal membrane, intraretinal hyper-reflective material, no IRF (Interval removal of tractional fibrosis and pucker; overall, interval improvement in TRD -- traction removed -- interval improvement in IRF/SRF).   Left Eye Quality was good. Central Foveal Thickness: 622. Progression has been stable. Findings include abnormal foveal contour, subretinal fluid, preretinal fibrosis, epiretinal membrane, vitreous traction.   Notes *Images captured and stored on drive  Diagnosis / Impression:   OD: TRD involving inferior macula -- Interval removal of tractional fibrosis and improved pucker; overall, interval improvement in TRD -- traction removed -- but +residual SRF (improving) OS: total macula-involving TRD  Clinical management:  See below  Abbreviations: NFP - Normal foveal profile. CME - cystoid macular edema. PED - pigment epithelial detachment. IRF - intraretinal fluid. SRF - subretinal fluid. EZ - ellipsoid zone. ERM - epiretinal membrane. ORA - outer retinal atrophy. ORT - outer retinal tubulation. SRHM - subretinal hyper-reflective material                 ASSESSMENT/PLAN:    ICD-10-CM   1. 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 Retinal edema  H35.81 OCT, Retina - OU - Both Eyes  3. Essential hypertension  I10   4. Hypertensive retinopathy of both eyes  H35.033   5. Combined forms of age-related cataract of both eyes  H25.813     1,2. 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)  - formerly managed at [facility] -- s/p PRP OU -- last visit in 2017  - extensive history of severe disease and medical noncompliance  - exam shows and OCT confirms TRD OU -- OD with inf macula and foveal involvement; OS with closed wolf-jaw total TRD with macular hole  -  discussed findings and very poor prognosis given chronicity of problems  - BCVA 20/150 OD from CF OU  - S/P IVA #1 OD (10.16.20)  - now POW3 s/p PPV/MP/EL/FAX/14% C3F8 OD, 10.22.20             - did well this week             - fibrosis improved and retina flattening under oil  - OCT shows persistent shallow SRF inferiorly             - IOP okay at 12             - cont   PF 4x/day OD    Cosopt BID OD                         Brimonidine BID OD                          PSO ung QID OD              - avoid laying flat on back             - post op drop and  positioning instructions reviewed              - tylenol for pain   - f/u 3 weeks, DFE, OCT  3,4. Hypertensive retinopathy OU  - discussed importance of tight BP control  - monitor  5. 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  - discussed diagnosis and progression  - not yet visually significant  - monitor for now   Ophthalmic Meds Ordered this visit:  No orders of the defined types were placed in this encounter.      Return in about 3 weeks (around 10/27/2019) for f/u PDR OU , DFE, OCT.  There are no Patient Instructions on file for this visit.   Explained the diagnoses, plan, and follow up with the patient and they expressed understanding.  Patient expressed understanding of the importance of proper follow up care.   This document serves as a record of services personally performed by Gardiner Sleeper, MD, PhD. It was created on their behalf by Ernest Mallick, OA, an ophthalmic assistant. The creation of this record is the provider's dictation and/or activities during the visit.    Electronically signed by: Ernest Mallick, OA 11.10.2020 11:08 PM   Gardiner Sleeper, M.D., Ph.D. Diseases & Surgery of the Retina and Vitreous [facility]  I have reviewed the above documentation for accuracy and completeness, and I agree with the above. Gardiner Sleeper, M.D., Ph.D. 10/08/19 11:08 PM    Abbreviations: M myopia (nearsighted); A astigmatism; H hyperopia (farsighted); P presbyopia; Mrx spectacle prescription;  CTL contact lenses; OD right eye; OS left eye; OU both eyes  XT exotropia; ET esotropia; PEK punctate epithelial keratitis; PEE punctate epithelial erosions; DES dry eye syndrome; MGD meibomian gland dysfunction; ATs artificial tears; PFAT's preservative free artificial tears; [place] nuclear sclerotic cataract; PSC posterior subcapsular cataract; ERM epi-retinal  membrane; PVD posterior vitreous detachment; RD retinal detachment; DM diabetes mellitus; DR diabetic retinopathy; NPDR non-proliferative diabetic retinopathy; PDR proliferative diabetic retinopathy; CSME clinically significant macular edema; DME diabetic macular edema; dbh dot blot hemorrhages; CWS cotton wool spot; POAG primary open angle glaucoma; C/D cup-to-disc ratio; HVF humphrey visual field;  GVF goldmann visual field; OCT optical coherence tomography; IOP intraocular pressure; BRVO Branch retinal vein occlusion; CRVO central retinal vein occlusion; CRAO central retinal artery occlusion; BRAO branch retinal artery occlusion; RT retinal tear; SB scleral buckle; PPV pars plana vitrectomy; VH Vitreous hemorrhage; PRP panretinal laser photocoagulation; IVK intravitreal kenalog; VMT vitreomacular traction; MH Macular hole;  NVD neovascularization of the disc; NVE neovascularization elsewhere; AREDS age related eye disease study; ARMD age related macular degeneration; POAG primary open angle glaucoma; EBMD epithelial/anterior basement membrane dystrophy; ACIOL anterior chamber intraocular lens; IOL intraocular lens; PCIOL posterior chamber intraocular lens; Phaco/IOL phacoemulsification with intraocular lens placement; [place] photorefractive keratectomy; LASIK laser assisted in situ keratomileusis; HTN hypertension; DM diabetes mellitus; COPD chronic obstructive pulmonary disease

## 2019-10-06 ENCOUNTER — Encounter (INDEPENDENT_AMBULATORY_CARE_PROVIDER_SITE_OTHER): Payer: Self-pay | Admitting: Ophthalmology

## 2019-10-06 ENCOUNTER — Ambulatory Visit (INDEPENDENT_AMBULATORY_CARE_PROVIDER_SITE_OTHER): Payer: Medicaid Other | Admitting: Ophthalmology

## 2019-10-06 DIAGNOSIS — H25813 Combined forms of age-related cataract, bilateral: Secondary | ICD-10-CM

## 2019-10-06 DIAGNOSIS — E113523 Type 2 diabetes mellitus with proliferative diabetic retinopathy with traction retinal detachment involving the macula, bilateral: Secondary | ICD-10-CM

## 2019-10-06 DIAGNOSIS — H3581 Retinal edema: Secondary | ICD-10-CM

## 2019-10-06 DIAGNOSIS — I10 Essential (primary) hypertension: Secondary | ICD-10-CM

## 2019-10-06 DIAGNOSIS — H35033 Hypertensive retinopathy, bilateral: Secondary | ICD-10-CM

## 2019-10-08 ENCOUNTER — Encounter (INDEPENDENT_AMBULATORY_CARE_PROVIDER_SITE_OTHER): Payer: Self-pay | Admitting: Ophthalmology

## 2019-10-27 ENCOUNTER — Encounter (INDEPENDENT_AMBULATORY_CARE_PROVIDER_SITE_OTHER): Payer: Medicaid Other | Admitting: Ophthalmology

## 2019-11-03 ENCOUNTER — Other Ambulatory Visit: Payer: Self-pay

## 2019-11-03 ENCOUNTER — Ambulatory Visit (INDEPENDENT_AMBULATORY_CARE_PROVIDER_SITE_OTHER): Payer: Medicaid Other | Admitting: Ophthalmology

## 2019-11-03 ENCOUNTER — Encounter (INDEPENDENT_AMBULATORY_CARE_PROVIDER_SITE_OTHER): Payer: Self-pay | Admitting: Ophthalmology

## 2019-11-03 DIAGNOSIS — H3581 Retinal edema: Secondary | ICD-10-CM | POA: Diagnosis not present

## 2019-11-03 DIAGNOSIS — H25813 Combined forms of age-related cataract, bilateral: Secondary | ICD-10-CM

## 2019-11-03 DIAGNOSIS — E113523 Type 2 diabetes mellitus with proliferative diabetic retinopathy with traction retinal detachment involving the macula, bilateral: Secondary | ICD-10-CM

## 2019-11-03 DIAGNOSIS — H35033 Hypertensive retinopathy, bilateral: Secondary | ICD-10-CM

## 2019-11-03 DIAGNOSIS — I10 Essential (primary) hypertension: Secondary | ICD-10-CM

## 2019-11-03 NOTE — Progress Notes (Signed)
[facility] Note  11/03/2019     CHIEF COMPLAINT Patient presents for Retina Follow Up   HISTORY OF PRESENT ILLNESS: Anne [name] is a 38 y.o. female who presents to the clinic today for:   HPI    Retina Follow Up    Patient presents with  Diabetic Retinopathy.  In both eyes.  This started 4 weeks ago.  Severity is moderate.  I, the attending physician,  performed the HPI with the patient and updated documentation appropriately.          Comments    Patient here for 4 weeks retina follow up for PDR OU (s/p PPV + MP 0.22.20). Patient states vision coming along. Can see a little bit better than before. OD temparally is sore. Stings and burns with drops.        Last edited by Bernarda Caffey, MD on 11/05/2019 12:51 AM. (History)    pt states she forgot to use her drops last night, pt states her A1c was just check and was 8.5, she states her dr "tweaked" her insulin   Referring physician: Zhou-Talbert, Elwyn Lade, MD [address] [place],  [place] [address]  HISTORICAL INFORMATION:   Selected notes from the MEDICAL RECORD NUMBER Referred by Dr. Stevenson Clinch for concern of VH / TRD OS LEE: 08.27.20 (E. [place]) [BCVA: OD: "blurry" OS" NLP] Ocular Hx- PMH-DM (a1c: 7.0, takes invokana, lantus, novolog, metformin), HLD, HTN   CURRENT MEDICATIONS: Current Outpatient Medications (Ophthalmic Drugs)  Medication Sig  . bacitracin-polymyxin b (POLYSPORIN) ophthalmic ointment Place into the right eye 4 (four) times daily. Place a 1/2 inch ribbon of ointment into the lower eyelid.  Marland Kitchen brimonidine (ALPHAGAN) 0.2 % ophthalmic solution Place 1 drop into the right eye 2 (two) times daily.  . dorzolamide-timolol (COSOPT) 22.3-6.8 MG/ML ophthalmic solution Place 1 drop into the right eye 2 (two) times daily.  . prednisoLONE acetate (PRED FORTE) 1 % ophthalmic suspension Place 1 drop into the right eye 4 (four) times daily.   No current facility-administered  medications for this visit. (Ophthalmic Drugs)   Current Outpatient Medications (Other)  Medication Sig  . albuterol (VENTOLIN HFA) 108 (90 Base) MCG/ACT inhaler Inhale 2 puffs into the lungs every 6 (six) hours as needed for wheezing or shortness of breath.  Marland Kitchen aspirin (GNP ASPIRIN LOW DOSE) 81 MG EC tablet Take 1 tablet (81 mg total) by mouth daily with breakfast. Swallow whole. (Patient taking differently: Take 81 mg by mouth daily with breakfast. )  . atorvastatin (LIPITOR) 80 MG tablet Take 1 tablet (80 mg total) by mouth every evening.  . carvedilol (COREG) 6.25 MG tablet Take 1.5 tablets (9.375 mg total) by mouth 2 (two) times daily.  . furosemide (LASIX) 40 MG tablet Take 1 tablet (40 mg total) by mouth 2 (two) times daily.  Marland Kitchen gabapentin (NEURONTIN) 300 MG capsule Take 300 mg by mouth 3 (three) times daily as needed (for neuropathy pain).   . isosorbide mononitrate (IMDUR) 30 MG 24 hr tablet Take 1 tablet (30 mg total) by mouth daily.  Marland Kitchen LANTUS SOLOSTAR 100 UNIT/ML Solostar Pen INNJECT 50 UNITS S.Q. ONCE DAILY AT 10 P.M. (Patient taking differently: Inject 50 Units into the skin at bedtime. )  . losartan (COZAAR) 25 MG tablet Take 1 tablet (25 mg total) by mouth every evening. (Patient taking differently: Take 50 mg by mouth every evening. )  . metFORMIN (GLUCOPHAGE) 500 MG tablet TAKE 1 TABLET BY  MOUTH TWICE DAILY WITH MEALS. (Patient taking differently: Take 1,000 mg by mouth 2 (two) times daily with a meal. )  . metoCLOPramide (REGLAN) 10 MG/10ML SOLN Take 10 mLs (10 mg total) by mouth 3 (three) times daily. (Patient taking differently: Take 5 mg by mouth 2 (two) times daily. )  . neomycin-bacitracin-polymyxin (NEOSPORIN) ointment Apply 1 application topically daily.  . nitroGLYCERIN (NITROSTAT) 0.4 MG SL tablet Place 1 tablet (0.4 mg total) under the tongue every 5 (five) minutes as needed.  Marland Kitchen NOVOLOG FLEXPEN 100 UNIT/ML FlexPen INJECT 12-18 UNITS S.Q. THREE TIMES DAILY WITH MEALS.  (Patient taking differently: Inject 12-22 Units into the skin 3 (three) times daily. Per MD sliding scale for blood sugars over 150)  . ondansetron (ZOFRAN ODT) 4 MG disintegrating tablet Take 1 tablet (4 mg total) by mouth every 8 (eight) hours as needed for nausea or vomiting.  Marland Kitchen oxyCODONE-acetaminophen (PERCOCET/ROXICET) 5-325 MG tablet Take 1 tablet by mouth every 4 (four) hours as needed. (Patient taking differently: Take 1 tablet by mouth every 4 (four) hours as needed for moderate pain. )  . potassium chloride (K-DUR) 10 MEQ tablet Take 1 tablet (10 mEq total) by mouth 2 (two) times daily.  . promethazine (PHENERGAN) 12.5 MG tablet Take 1 tablet (12.5 mg total) by mouth every 6 (six) hours as needed for nausea or vomiting.  . sertraline (ZOLOFT) 100 MG tablet Take 200 mg by mouth at bedtime.   Marland Kitchen spironolactone (ALDACTONE) 25 MG tablet Take 0.5 tablets (12.5 mg total) by mouth daily.  . ticagrelor ([name]) 90 MG TABS tablet Take 1 tablet (90 mg total) by mouth 2 (two) times daily.   No current facility-administered medications for this visit. (Other)      REVIEW OF SYSTEMS: ROS    Positive for: Endocrine, Eyes   Negative for: Constitutional, Gastrointestinal, Neurological, Skin, Genitourinary, Musculoskeletal, HENT, Cardiovascular, Respiratory, Psychiatric, Allergic/Imm, Heme/Lymph   Last edited by Theodore Demark, COA on 11/03/2019  2:24 PM. (History)       ALLERGIES Allergies  Allergen Reactions  . Canagliflozin Other (See Comments)    Vaginal yeast infections  . Nsaids Other (See Comments)    Yeast infection     PAST MEDICAL HISTORY Past Medical History:  Diagnosis Date  . Acid reflux   . Anxiety   . Arthritis   . 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 CAD (coronary artery disease) 11/13/2018   Late presentation anterior MI 12/19 >> LHC - dLM 25, mLAD 99, oOM2 100 (R-L collats), irreg RCA, EF 25-35 >> PCI: POBA to mLAD  . Chronic  systolic CHF (congestive heart failure) ([place]) 11/28/2018   Ischemic CM // late presentation ant MI 10/2018 tx with POBA to LAD (residual CAD with CTO of the OM2) // Echo 12/19:  No mural apical thrombus, septal, apical mid ant and inf HK; mild LVH, EF 30-35, mild MR // Echo 01/2019: EF 30-35, Gr 1 DD, diff HK, apical AK, mild MR   . CKD (chronic kidney disease), stage I   . Diabetes mellitus type 1 ([place])   . Dyspnea   . Former tobacco use   . Gastroparesis   . Hypercholesteremia   . Hypertension   . Myocardial infarction ([place]) 2019  . Osteomyelitis ([place])    a. s/p R forefoot amputation.  . Renal failure   . Sciatica    Past Surgical History:  Procedure Laterality Date  . AMPUTATION Right 11/03/2011  Procedure: AMPUTATION RAY;  Surgeon: Jamesetta So;  Location: [facility];  Service: General;  Laterality: Right;  Right fourth and fifth metatarsal   . APPENDECTOMY    . CARDIAC CATHETERIZATION  10/2018  . CESAREAN SECTION  2004 and 2007   x2  . CORONARY/GRAFT ACUTE MI REVASCULARIZATION N/A 11/11/2018   Procedure: CORONARY/GRAFT ACUTE MI REVASCULARIZATION;  Surgeon: Jettie Booze, MD;  Location: [facility];  Service: Cardiovascular;  Laterality: N/A;  . FRACTURE SURGERY  2000  . INJECTION OF SILICONE OIL Right [id number]   Procedure: Injection Of Silicone Oil;  Surgeon: Bernarda Caffey, MD;  Location: [facility];  Service: Ophthalmology;  Laterality: Right;  . LEFT HEART CATH AND CORONARY ANGIOGRAPHY N/A 11/11/2018   Procedure: LEFT HEART CATH AND CORONARY ANGIOGRAPHY;  Surgeon: Jettie Booze, MD;  Location: [facility];  Service: Cardiovascular;  Laterality: N/A;  . MEMBRANE PEEL Right 09/14/2019   Procedure: Antoine Primas;  Surgeon: Bernarda Caffey, MD;  Location: [facility];  Service: Ophthalmology;  Laterality: Right;  . PARS PLANA VITRECTOMY Right 09/14/2019   Procedure: [place] Vitrectomy With 25 Gauge;  Surgeon: Bernarda Caffey, MD;  Location: [facility];  Service:  Ophthalmology;  Laterality: Right;  . PHOTOCOAGULATION WITH LASER Right 09/14/2019   Procedure: Photocoagulation With Laser;  Surgeon: Bernarda Caffey, MD;  Location: [facility];  Service: Ophthalmology;  Laterality: Right;  . 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   Procedure: REPAIR OF COMPLEX TRACTION RETINAL DETACHMENT;  Surgeon: Bernarda Caffey, MD;  Location: [facility];  Service: Ophthalmology;  Laterality: Right;  . TUBAL LIGATION      FAMILY HISTORY Family History  Problem Relation Age of Onset  . Diabetes Father   . Lung cancer Father   . Alcoholism Father   . Asthma Mother   . Kidney disease Mother   . Anemia Mother        hemolytic  . Hypertension Mother   . Heart attack Paternal Grandmother   . Diabetes Paternal Grandmother   . Diabetes Paternal Grandfather   . Anesthesia problems Neg Hx   . Hypotension Neg Hx   . Malignant hyperthermia Neg Hx   . Pseudochol deficiency Neg Hx     SOCIAL HISTORY Social History   Tobacco Use  . Smoking status: Former Smoker    Packs/day: 0.25    Years: 20.00    Pack years: 5.00    Types: Cigarettes    Quit date: 12/29/2013    Years since quitting: 5.8  . Smokeless tobacco: Never Used  Substance Use Topics  . Alcohol use: No  . Drug use: No         OPHTHALMIC EXAM:  Base Eye Exam    Visual Acuity (Snellen - Linear)      Right Left   Dist [place] 20/300 -1 CF at 6 ''   Dist ph [place] 20/250        Tonometry (Tonopen, 2:19 PM)      Right Left   Pressure 22 16       Pupils      Dark Light Shape React APD   Right 6 6 Round Minimal None   Left 3 3 Round Slow None       Visual Fields (Counting fingers)      Left Right   Restrictions Total superior temporal, inferior temporal, superior nasal, inferior nasal deficiencies Partial outer superior nasal, inferior nasal deficiencies       Extraocular Movement  Right Left    Full, Ortho Full, Ortho       Neuro/Psych    Oriented x3: Yes   Mood/Affect:  Normal       Dilation    Both eyes: 1.0% Mydriacyl, 2.5% Phenylephrine @ 2:18 PM        Slit Lamp and Fundus Exam    Slit Lamp Exam      Right Left   Lids/Lashes Periorbital edema - improving Dermatochalasis - upper lid, Meibomian gland dysfunction   Conjunctiva/Sclera White and quiet White and quiet   Cornea 1-2+ inferior Punctate epithelial erosions, irregular epi Clear   Anterior Chamber Deep and quiet Deep and quiet   Iris Round and dilated, No NVI Round and dilated, No NVI   Lens 1+ Nuclear sclerosis, 1+ Cortical cataract 1+ Nuclear sclerosis, 1+ Cortical cataract   Vitreous post vitrectomy; good silicon oil fill Vitreous syneresis       Fundus Exam      Right Left   Disc Pink and Sharp, mild tilt obscured by fibrosis   C/D Ratio 0.5 no view   Macula Flat under oil, fibrosis improved, shallow residual SRF inferiorly -- improving, pigment clumping temporal macula Closed wolf-jaw TRD with macular hole   Vessels Vascular attenuation, Tortuous, focal fibrosis superior to disc 4+ fibrotic NVD w/ TRD   Periphery Improvement in tractional fibrosis at 0100 above disc and along arcades, shallow SRF improving; scattered IRH and exudate, 360 PRP with good posterior fill in scattered PRP, almost total detachment -- extension of central/posterior TRD          IMAGING AND PROCEDURES  Imaging and Procedures for @TODAY @  OCT, Retina - OU - Both Eyes       Right Eye Quality was good. Central Foveal Thickness: 277. Progression has improved. Findings include abnormal foveal contour, subretinal fluid, epiretinal membrane, intraretinal hyper-reflective material, no IRF (Interval removal of tractional fibrosis and pucker; stable improvement in TRD -- traction removed -- interval improvement in SRF).   Left Eye Quality was good. Central Foveal Thickness: 622. Progression has been stable. Findings include abnormal foveal contour, subretinal fluid, preretinal fibrosis, epiretinal membrane,  vitreous traction.   Notes *Images captured and stored on drive  Diagnosis / Impression:  OD: TRD involving inferior macula -- Interval removal of tractional fibrosis and improved pucker; overall, stable improvement in TRD -- traction removed -- but +residual SRF (improving) OS: total macula-involving TRD  Clinical management:  See below  Abbreviations: NFP - Normal foveal profile. CME - cystoid macular edema. PED - pigment epithelial detachment. IRF - intraretinal fluid. SRF - subretinal fluid. EZ - ellipsoid zone. ERM - epiretinal membrane. ORA - outer retinal atrophy. ORT - outer retinal tubulation. SRHM - subretinal hyper-reflective material                 ASSESSMENT/PLAN:    ICD-10-CM   1. 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 Retinal edema  H35.81 OCT, Retina - OU - Both Eyes  3. Essential hypertension  I10   4. Hypertensive retinopathy of both eyes  H35.033   5. Combined forms of age-related cataract of both eyes  H25.813     1,2. 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)  - [facility] managed [facility] -- s/p PRP OU -- last visit in 2017  - extensive history of severe disease and medical noncompliance  -  exam shows and OCT confirms TRD OU -- OD with inf macula and foveal involvement; OS with closed wolf-jaw total TRD with macular hole  - discussed findings and very poor prognosis given chronicity of problems  - BCVA 20/250 from 20/150 OD   - S/P IVA #1 OD (10.16.20)  - s/p PPV/MP/EL/FAX/14% C3F8 OD, 10.22.20             - fibrosis improved and retina flattening under oil  - OCT shows persistent shallow SRF inferiorly -- improving slowly             - IOP elevated at 22, but pt did not use drops last night             - cont   PF 4x/day OD -- decrease to BID   Cosopt BID OD                          Brimonidine BID OD                          PSO ung QID OD -- PRN / bedtime             - avoid laying flat on back             - post op drop and positioning instructions reviewed              - tylenol for pain   - f/u 4 weeks, DFE, OCT  3,4. Hypertensive retinopathy OU  - discussed importance of tight BP control  - monitor  5. 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  - discussed diagnosis and progression  - not yet visually significant  - monitor for now   Ophthalmic Meds Ordered this visit:  No orders of the defined types were placed in this encounter.      Return in about 4 weeks (around 12/01/2019) for f/u PDR OU, DFE, OCT.  There are no Patient Instructions on file for this visit.   Explained the diagnoses, plan, and follow up with the patient and they expressed understanding.  Patient expressed understanding of the importance of proper follow up care.   This document serves as a record of services personally performed by Gardiner Sleeper, MD, PhD. It was created on their behalf by Ernest Mallick, OA, an ophthalmic assistant. The creation of this record is the provider's dictation and/or activities during the visit.    Electronically signed by: Ernest Mallick, OA 12.11.2020 12:51 AM   Gardiner Sleeper, M.D., Ph.D. Diseases & Surgery of the Retina and Vitreous [facility]  I have reviewed the above documentation for accuracy and completeness, and I agree with the above. Gardiner Sleeper, M.D., Ph.D. 11/05/19 12:52 AM     Abbreviations: M myopia (nearsighted); A astigmatism; H hyperopia (farsighted); P presbyopia; Mrx spectacle prescription;  CTL contact lenses; OD right eye; OS left eye; OU both eyes  XT exotropia; ET esotropia; PEK punctate epithelial keratitis; PEE punctate epithelial erosions; DES dry eye syndrome; MGD meibomian gland dysfunction; ATs  artificial tears; PFAT's preservative free artificial tears; [place] nuclear sclerotic cataract; PSC posterior subcapsular cataract; ERM epi-retinal membrane; PVD posterior vitreous detachment; RD retinal detachment; DM diabetes mellitus; DR diabetic retinopathy; NPDR non-proliferative diabetic retinopathy; PDR proliferative diabetic retinopathy; CSME clinically significant macular edema;  DME diabetic macular edema; dbh dot blot hemorrhages; CWS cotton wool spot; POAG primary open angle glaucoma; C/D cup-to-disc ratio; HVF humphrey visual field; GVF goldmann visual field; OCT optical coherence tomography; IOP intraocular pressure; BRVO Branch retinal vein occlusion; CRVO central retinal vein occlusion; CRAO central retinal artery occlusion; BRAO branch retinal artery occlusion; RT retinal tear; SB scleral buckle; PPV pars plana vitrectomy; VH Vitreous hemorrhage; PRP panretinal laser photocoagulation; IVK intravitreal kenalog; VMT vitreomacular traction; MH Macular hole;  NVD neovascularization of the disc; NVE neovascularization elsewhere; AREDS age related eye disease study; ARMD age related macular degeneration; POAG primary open angle glaucoma; EBMD epithelial/anterior basement membrane dystrophy; ACIOL anterior chamber intraocular lens; IOL intraocular lens; PCIOL posterior chamber intraocular lens; Phaco/IOL phacoemulsification with intraocular lens placement; [place] photorefractive keratectomy; LASIK laser assisted in situ keratomileusis; HTN hypertension; DM diabetes mellitus; COPD chronic obstructive pulmonary disease

## 2019-11-05 ENCOUNTER — Encounter (INDEPENDENT_AMBULATORY_CARE_PROVIDER_SITE_OTHER): Payer: Self-pay | Admitting: Ophthalmology

## 2019-12-05 ENCOUNTER — Encounter (INDEPENDENT_AMBULATORY_CARE_PROVIDER_SITE_OTHER): Payer: Medicaid Other | Admitting: Ophthalmology

## 2019-12-08 ENCOUNTER — Encounter (INDEPENDENT_AMBULATORY_CARE_PROVIDER_SITE_OTHER): Payer: Medicaid Other | Admitting: Ophthalmology

## 2019-12-11 ENCOUNTER — Encounter (INDEPENDENT_AMBULATORY_CARE_PROVIDER_SITE_OTHER): Payer: Medicaid Other | Admitting: Ophthalmology

## 2019-12-26 ENCOUNTER — Encounter: Payer: Self-pay | Admitting: Cardiology

## 2019-12-26 ENCOUNTER — Telehealth: Payer: Self-pay

## 2019-12-26 ENCOUNTER — Other Ambulatory Visit: Payer: Self-pay

## 2019-12-26 ENCOUNTER — Telehealth (INDEPENDENT_AMBULATORY_CARE_PROVIDER_SITE_OTHER): Payer: Medicaid Other | Admitting: Cardiology

## 2019-12-26 VITALS — BP 149/82 | HR 96 | Ht 70.0 in | Wt 190.0 lb

## 2019-12-26 DIAGNOSIS — I10 Essential (primary) hypertension: Secondary | ICD-10-CM

## 2019-12-26 DIAGNOSIS — I5042 Chronic combined systolic (congestive) and diastolic (congestive) heart failure: Secondary | ICD-10-CM

## 2019-12-26 DIAGNOSIS — E1159 Type 2 diabetes mellitus with other circulatory complications: Secondary | ICD-10-CM | POA: Diagnosis not present

## 2019-12-26 DIAGNOSIS — I25118 Atherosclerotic heart disease of native coronary artery with other forms of angina pectoris: Secondary | ICD-10-CM | POA: Diagnosis not present

## 2019-12-26 MED ORDER — FENOFIBRATE 145 MG PO TABS: 145.0000 mg | ORAL_TABLET | Freq: Every day | ORAL | 3 refills | Status: AC

## 2019-12-26 NOTE — Telephone Encounter (Signed)
Spoke to pt who gave consent for Gae Bon to speak with her via video conferencing  /jb

## 2019-12-26 NOTE — Progress Notes (Signed)
Virtual Visit via Video Note   This visit type was conducted due to national recommendations for restrictions regarding the COVID-19 Pandemic (e.g. social distancing) in an effort to limit this patient's exposure and mitigate transmission in our community.  Due to her co-morbid illnesses, this patient is at least at moderate risk for complications without adequate follow up.  This format is felt to be most appropriate for this patient at this time.  All issues noted in this document were discussed and addressed.  A limited physical exam was performed with this format.  Please refer to the patient's chart for her consent to telehealth for [facility].   Date:  12/26/2019   ID:  Anne [name], DOB Sep 08, 1981, MRN [id number]  Patient Location: Home Provider Location: Home  PCP:  Zhou-Talbert, Elwyn Lade, MD  Cardiologist:  Dorris Carnes, MD  Electrophysiologist:  Cristopher Peru, MD   Evaluation Performed:  Follow-Up Visit  Chief Complaint:  Follow up for CAD and CHF  History of Present Illness:    Anne [name] is a 39 y.o. 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 She was admitted in 10/2018 with a late presentation anterior MIc/b post MI pericarditis. CardiacCath demonstrated diffuse dz in the LAD and LCx. She underwent POBA to the LAD b/c it was not large enough for stenting or CABG. EF was30-35. She did not tolerate Entresto due to low BP and is treated with Losartan.   She was seen by Dr. Lovena Le in 05/2019 for consideration of ICD. No mention of planned placement.   Today, Anne [name] is doing well. She is not very active. She does some light housework. Gets tired standing cooking or washing dishes.  She gets weak and dizzy when goes from seated to standing and when she gets up during the night. She lives with her  husband and kids and they help her.  She tried using an elliptical machine after her MI in 2019 and this made her dizzy. She says that her functional capacity has declined steadily beginning in 2012 with her repeated foot and wound issues.   She is doing daily stretches. She reports scant LE edema. She has 2 pillow orthopnea. She is short of breath with activity, but can walk in the house OK. She has occ chest heaviness like she can't take a full breath. This limits her activities. She feels like the angioplasty in 2019 did not significantly help her symptoms. Her symptoms have been present but stable since that time. She has had several falls in the summer and cracked a rib falling in the bathtub. She is being arranged to have some home care services with PT and HHN.   She reports that her BP has been running mostly in the 130's/80's. She denies low blood pressures.  The patient does not have symptoms concerning for COVID-19 infection (fever, chills, cough, or new shortness of breath).    Past Medical History:  Diagnosis Date  . Acid reflux   . Anxiety   . Arthritis   . 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 CAD (coronary artery disease) 11/13/2018   Late presentation anterior MI 12/19 >> LHC - dLM 25, mLAD 99, oOM2 100 (R-L collats), irreg RCA, EF 25-35 >> PCI: POBA to mLAD  . Chronic systolic CHF (congestive  heart failure) ([place]) 11/28/2018   Ischemic CM // late presentation ant MI 10/2018 tx with POBA to LAD (residual CAD with CTO of the OM2) // Echo 12/19:  No mural apical thrombus, septal, apical mid ant and inf HK; mild LVH, EF 30-35, mild MR // Echo 01/2019: EF 30-35, Gr 1 DD, diff HK, apical AK, mild MR   . CKD (chronic kidney disease), stage I   . Diabetes mellitus type 1 ([place])   . Dyspnea   . Former tobacco use   . Gastroparesis   . Hypercholesteremia   . Hypertension   . Myocardial infarction ([place]) 2019  . Osteomyelitis ([place])    a. s/p R forefoot  amputation.  . Renal failure   . Sciatica    Past Surgical History:  Procedure Laterality Date  . AMPUTATION Right 11/03/2011   Procedure: AMPUTATION RAY;  Surgeon: Jamesetta So;  Location: [facility];  Service: General;  Laterality: Right;  Right fourth and fifth metatarsal   . APPENDECTOMY    . CARDIAC CATHETERIZATION  10/2018  . CESAREAN SECTION  2004 and 2007   x2  . CORONARY/GRAFT ACUTE MI REVASCULARIZATION N/A 11/11/2018   Procedure: CORONARY/GRAFT ACUTE MI REVASCULARIZATION;  Surgeon: Jettie Booze, MD;  Location: [facility];  Service: Cardiovascular;  Laterality: N/A;  . FRACTURE SURGERY  2000  . INJECTION OF SILICONE OIL Right [id number]   Procedure: Injection Of Silicone Oil;  Surgeon: Bernarda Caffey, MD;  Location: [place];  Service: Ophthalmology;  Laterality: Right;  . LEFT HEART CATH AND CORONARY ANGIOGRAPHY N/A 11/11/2018   Procedure: LEFT HEART CATH AND CORONARY ANGIOGRAPHY;  Surgeon: Jettie Booze, MD;  Location: [place];  Service: Cardiovascular;  Laterality: N/A;  . MEMBRANE PEEL Right 09/14/2019   Procedure: Antoine Primas;  Surgeon: Bernarda Caffey, MD;  Location: [place];  Service: Ophthalmology;  Laterality: Right;  . PARS PLANA VITRECTOMY Right 09/14/2019   Procedure: [place] Vitrectomy With 25 Gauge;  Surgeon: Bernarda Caffey, MD;  Location: [place] [facility];  Service: Ophthalmology;  Laterality: Right;  . PHOTOCOAGULATION WITH LASER Right 09/14/2019   Procedure: Photocoagulation With Laser;  Surgeon: Bernarda Caffey, MD;  Location: [place];  Service: Ophthalmology;  Laterality: Right;  . 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   Procedure: REPAIR OF COMPLEX TRACTION RETINAL DETACHMENT;  Surgeon: Bernarda Caffey, MD;  Location: [place];  Service: Ophthalmology;  Laterality: Right;  . TUBAL LIGATION       Current Meds  Medication Sig  . albuterol (VENTOLIN HFA) 108 (90 Base) MCG/ACT inhaler Inhale 2 puffs into the lungs every 6 (six)  hours as needed for wheezing or shortness of breath.  Marland Kitchen aspirin (GNP ASPIRIN LOW DOSE) 81 MG EC tablet Take 1 tablet (81 mg total) by mouth daily with breakfast. Swallow whole.  Marland Kitchen atorvastatin (LIPITOR) 80 MG tablet Take 1 tablet (80 mg total) by mouth every evening.  . brimonidine (ALPHAGAN) 0.2 % ophthalmic solution Place 1 drop into the right eye 2 (two) times daily.  . carvedilol (COREG) 6.25 MG tablet Take 1.5 tablets (9.375 mg total) by mouth 2 (two) times daily.  . dorzolamide-timolol (COSOPT) 22.3-6.8 MG/ML ophthalmic solution Place 1 drop into the right eye 2 (two) times daily.  . isosorbide mononitrate (IMDUR) 30 MG 24 hr tablet Take 1 tablet (30 mg total) by mouth daily.  Marland Kitchen LANTUS SOLOSTAR 100 UNIT/ML Solostar Pen INNJECT 50 UNITS S.Q. ONCE DAILY AT 10 P.M.  . losartan (COZAAR) 25  MG tablet Take 1 tablet (25 mg total) by mouth every evening.  . metFORMIN (GLUCOPHAGE) 1000 MG tablet Take 1,000 mg by mouth 2 (two) times daily with a meal.  . metoCLOPramide (REGLAN) 5 MG tablet Take 5 mg by mouth 2 (two) times daily.  . nitroGLYCERIN (NITROSTAT) 0.4 MG SL tablet Place 1 tablet (0.4 mg total) under the tongue every 5 (five) minutes as needed.  Marland Kitchen NOVOLOG FLEXPEN 100 UNIT/ML FlexPen INJECT 12-18 UNITS S.Q. THREE TIMES DAILY WITH MEALS.  Marland Kitchen ondansetron (ZOFRAN ODT) 4 MG disintegrating tablet Take 1 tablet (4 mg total) by mouth every 8 (eight) hours as needed for nausea or vomiting.  . prednisoLONE acetate (PRED FORTE) 1 % ophthalmic suspension Place 1 drop into the right eye 4 (four) times daily.  . pregabalin (LYRICA) 75 MG capsule Take 75 mg by mouth 2 (two) times daily.  . promethazine (PHENERGAN) 12.5 MG suppository Place 12.5 mg rectally as needed for nausea or vomiting.  . sertraline (ZOLOFT) 100 MG tablet Take 200 mg by mouth at bedtime.   Marland Kitchen spironolactone (ALDACTONE) 25 MG tablet Take 0.5 tablets (12.5 mg total) by mouth daily.  . ticagrelor ([name]) 90 MG TABS tablet Take 1 tablet  (90 mg total) by mouth 2 (two) times daily.  . [DISCONTINUED] losartan (COZAAR) 50 MG tablet Take 50 mg by mouth daily.     Allergies:   Canagliflozin and Nsaids   Social History   Tobacco Use  . Smoking status: Former Smoker    Packs/day: 0.25    Years: 20.00    Pack years: 5.00    Types: Cigarettes    Quit date: 12/29/2013    Years since quitting: 5.9  . Smokeless tobacco: Never Used  Substance Use Topics  . Alcohol use: No  . Drug use: No     Family Hx: The patient's family history includes Alcoholism in her father; Anemia in her mother; Asthma in her mother; Diabetes in her father, paternal grandfather, and paternal grandmother; Heart attack in her paternal grandmother; Hypertension in her mother; Kidney disease in her mother; Lung cancer in her father. There is no history of Anesthesia problems, Hypotension, Malignant hyperthermia, or Pseudochol deficiency.  ROS:   Please see the history of present illness.     All other systems reviewed and are negative.   Prior CV studies:   The following studies were reviewed today:  Echo 11/13/18 Study Conclusions  - Left ventricle: No mural apical thrombus with definity. septal apical mid anterior and inferior hypokinesis preserved basal function The cavity size was moderately dilated. Wall thickness was increased in a pattern of mild LVH. Systolic function was moderately to severely reduced. The estimated ejection fraction was in the range of 30% to 35%. - Mitral valve: There was mild regurgitation. - Atrial septum: No defect or patent foramen ovale was identified.  LEFT HEART CATH AND CORONARY ANGIOGRAPHY  CORONARY/GRAFT ACUTE MI REVASCULARIZATION12/20/19   Conclusion   Ost 2nd Mrg to 2nd Mrg lesion is 100% stenosed. Likely chronic occlusion with right to left collaterals.  Mid LAD lesion is 99% stenosed. PTCA with 2.0 mm balloon performed.  Post intervention, there is a 25% residual stenosis.  Dist LM  lesion is 25% stenosed.  There is severe left ventricular systolic dysfunction.  The left ventricular ejection fraction is 25-35% by visual estimate.  LV end diastolic pressure is mildly elevated.  There is no aortic valve stenosis.      Labs/Other Tests and Data Reviewed:  EKG:  An ECG dated 07/17/2019 was personally reviewed today and demonstrated:  Sinus rhythm, 88 bpm, LAFB-QRS 88, poor r wave progression  Recent Labs: 01/09/2019: ALT 27 09/01/2019: NT-Pro BNP 313; TSH 1.580 09/14/2019: BUN 15; Creatinine, Ser 1.14; Hemoglobin 11.5; Platelets 261; Potassium 3.7; Sodium 136   Recent Lipid Panel Lab Results  Component Value Date/Time   CHOL 189 09/01/2019 11:37 AM   TRIG 546 (H) 09/01/2019 11:37 AM   HDL 26 (L) 09/01/2019 11:37 AM   CHOLHDL 7.3 (H) 09/01/2019 11:37 AM   CHOLHDL 6.8 11/11/2018 01:04 PM   LDLCALC 76 09/01/2019 11:37 AM   LDLDIRECT 98 02/10/2012 12:30 PM    Wt Readings from Last 3 Encounters:  12/26/19 190 lb (86.2 kg)  09/14/19 180 lb (81.6 kg)  09/01/19 184 lb 1.9 oz (83.5 kg)     Objective:    Vital Signs:  BP (!) 149/82   Pulse 96   Ht 5\' 10"  (1.778 m)   Wt 190 lb (86.2 kg)   BMI 27.26 kg/m    VITAL SIGNS:  reviewed GEN:  no acute distress EYES:  sclerae anicteric, EOMI - Extraocular Movements Intact RESPIRATORY:  normal respiratory effort, symmetric expansion CARDIOVASCULAR:  no peripheral edema NEURO:  alert and oriented x 3, no obvious focal deficit PSYCH:  normal affect  ASSESSMENT & PLAN:    CAD with stable angina -On [name], aspirin, high intensity statin, beta blocker and long acting nitrate. -Pt with stable angina, unchanged since her MI in 10/2018. Likely much of her symptoms are related to her deconditioning. We discussed trying to add some gentle activity beginning in the chair and working up slowly over time. We discussed improving vascular tone with movement and exercise which would help with her dizziness and fall risk.  She says that she is to start home PT.   Combined systolic and diastolic CHF -LVEF [id number] by echo 10/2018 -On Losartan, BP did not tolerate Entresto. On carvedilol and spironolactone, lasix 40 mg BID.  -No significant heart failure symptoms although she is quite activity intolerant. This seems to have been getting progressively worse since 2012 related to her foot and wound issues. We discussed trying to get more active as above. Unfortunately she was unable to attend cardiac rehab after her MI due to her foot wound and subsequent amputation.  -Last labs in 08/2019 with stable renal funciton, SCr 1.14. Potassium 3.7.  -continue current therapy. -F/U with Dr. Harrington Challenger in 3 months.  Hypertension -Pt reports home BP has been mostly controlled, occasionally elevated, but with her dizziness and falls, I would not like to lower it too much.   Hyperlipidemia -On high intensity statin with atorvastatin 80 mg daily -Lipid panel in 08/2019: TC 189, HDL 26, LDL 76, Trig 546 -Triglycerides likely elevated due to diabetes.  -Will add fenofibrate.   Diabetes type 2, on insulin -Followed by Dr Altheimer for endocrinology.  -With gastroparesis.   -Pt reports that blood sugar has been better controlled lately.    COVID-19 Education: The signs and symptoms of COVID-19 were discussed with the patient and how to seek care for testing (follow up with PCP or arrange E-visit).  The importance of social distancing was discussed today.  Time:   Today, I have spent 29 minutes with the patient with telehealth technology discussing the above problems.     Medication Adjustments/Labs and Tests Ordered: Current medicines are reviewed at length with the patient today.  Concerns regarding medicines are outlined above.   Tests  Ordered: No orders of the defined types were placed in this encounter.   Medication Changes: Meds ordered this encounter  Medications  . fenofibrate (TRICOR) 145 MG tablet    Sig: Take 1  tablet (145 mg total) by mouth daily.    Dispense:  90 tablet    Refill:  3    Order Specific Question:   Supervising Provider    Answer:   Allegra Lai MARTIN [id number]    Follow Up:  In Person in 3 month(s)  Signed, Daune Perch, NP  12/26/2019 5:37 PM    [facility]

## 2019-12-26 NOTE — Patient Instructions (Signed)
Medication Instructions:  Start Fenofibrate 145 mg once a day   *If you need a refill on your cardiac medications before your next appointment, please call your pharmacy*  Lab Work: None ordered   If you have labs (blood work) drawn today and your tests are completely normal, you will receive your results only by: Marland Kitchen MyChart Message (if you have MyChart) OR . A paper copy in the mail If you have any lab test that is abnormal or we need to change your treatment, we will call you to review the results.  Testing/Procedures: None ordered  Follow-Up: You are scheduled to see Dorris Carnes, MD on 04/01/2020 @ 2:45 PM  Other Instructions None

## 2020-01-19 ENCOUNTER — Other Ambulatory Visit: Payer: Self-pay

## 2020-01-19 ENCOUNTER — Encounter (INDEPENDENT_AMBULATORY_CARE_PROVIDER_SITE_OTHER): Payer: Medicaid Other | Admitting: Ophthalmology

## 2020-01-19 ENCOUNTER — Encounter (INDEPENDENT_AMBULATORY_CARE_PROVIDER_SITE_OTHER): Payer: Self-pay

## 2020-01-22 NOTE — Progress Notes (Signed)
[facility] Note  01/23/2020     CHIEF COMPLAINT Patient presents for Retina Follow Up   HISTORY OF PRESENT ILLNESS: Anne [name] is a 39 y.o. female who presents to the clinic today for:   HPI    Retina Follow Up    Patient presents with  Diabetic Retinopathy.  In both eyes.  I, the attending physician,  performed the HPI with the patient and updated documentation appropriately.          Comments    Patient states vision slightly worse OD since she ran out of brimonidine and cosopt. Ran out of gtts about 1 week ago. Patient is late to follow-up (2.5 months instead of 4 weeks) due to transportation issues. BS was 157 this am. Last a1c was 8.5, checked within past 4-6 weeks. Using Pred Forte bid OD.        Last edited by Bernarda Caffey, MD on 01/23/2020  5:02 PM. (History)    pt states it's been about a week and a half since she last used her pressure drops, she states last week she had a "stabbing headache"   Referring physician: Alfonse Flavors, MD [address] [place],  [place] [address]  HISTORICAL INFORMATION:   Selected notes from the MEDICAL RECORD NUMBER Referred by Dr. Stevenson Clinch for concern of VH / TRD OS   CURRENT MEDICATIONS: Current Outpatient Medications (Ophthalmic Drugs)  Medication Sig  . prednisoLONE acetate (PRED FORTE) 1 % ophthalmic suspension Place 1 drop into the right eye 4 (four) times daily. (Patient taking differently: Place 1 drop into the right eye in the morning and at bedtime. )  . brimonidine (ALPHAGAN) 0.2 % ophthalmic solution Place 1 drop into the right eye 3 (three) times daily.  . dorzolamide-timolol (COSOPT) 22.3-6.8 MG/ML ophthalmic solution Place 1 drop into the right eye 3 (three) times daily.   No current facility-administered medications for this visit. (Ophthalmic Drugs)   Current Outpatient Medications (Other)  Medication Sig  . albuterol (VENTOLIN HFA) 108 (90 Base) MCG/ACT inhaler  Inhale 2 puffs into the lungs every 6 (six) hours as needed for wheezing or shortness of breath.  Marland Kitchen aspirin (GNP ASPIRIN LOW DOSE) 81 MG EC tablet Take 1 tablet (81 mg total) by mouth daily with breakfast. Swallow whole.  Marland Kitchen atorvastatin (LIPITOR) 80 MG tablet Take 1 tablet (80 mg total) by mouth every evening.  . carvedilol (COREG) 6.25 MG tablet Take 1.5 tablets (9.375 mg total) by mouth 2 (two) times daily.  . fenofibrate (TRICOR) 145 MG tablet Take 1 tablet (145 mg total) by mouth daily.  . isosorbide mononitrate (IMDUR) 30 MG 24 hr tablet Take 1 tablet (30 mg total) by mouth daily.  Marland Kitchen LANTUS SOLOSTAR 100 UNIT/ML Solostar Pen INNJECT 50 UNITS S.Q. ONCE DAILY AT 10 P.M.  . losartan (COZAAR) 25 MG tablet Take 1 tablet (25 mg total) by mouth every evening.  . metFORMIN (GLUCOPHAGE) 1000 MG tablet Take 1,000 mg by mouth 2 (two) times daily with a meal.  . metoCLOPramide (REGLAN) 5 MG tablet Take 5 mg by mouth 2 (two) times daily.  . nitroGLYCERIN (NITROSTAT) 0.4 MG SL tablet Place 1 tablet (0.4 mg total) under the tongue every 5 (five) minutes as needed.  Marland Kitchen NOVOLOG FLEXPEN 100 UNIT/ML FlexPen INJECT 12-18 UNITS S.Q. THREE TIMES DAILY WITH MEALS.  Marland Kitchen ondansetron (ZOFRAN ODT) 4 MG disintegrating tablet Take 1 tablet (4 mg total) by mouth every 8 (eight)  hours as needed for nausea or vomiting.  . pregabalin (LYRICA) 75 MG capsule Take 75 mg by mouth 2 (two) times daily.  . promethazine (PHENERGAN) 12.5 MG suppository Place 12.5 mg rectally as needed for nausea or vomiting.  . sertraline (ZOLOFT) 100 MG tablet Take 200 mg by mouth at bedtime.   Marland Kitchen spironolactone (ALDACTONE) 25 MG tablet Take 0.5 tablets (12.5 mg total) by mouth daily.  . ticagrelor ([name]) 90 MG TABS tablet Take 1 tablet (90 mg total) by mouth 2 (two) times daily.  . furosemide (LASIX) 40 MG tablet Take 1 tablet (40 mg total) by mouth 2 (two) times daily.  . potassium chloride (K-DUR) 10 MEQ tablet Take 1 tablet (10 mEq total) by  mouth 2 (two) times daily.   No current facility-administered medications for this visit. (Other)      REVIEW OF SYSTEMS: ROS    Positive for: Endocrine, Eyes   Negative for: Constitutional, Gastrointestinal, Neurological, Skin, Genitourinary, Musculoskeletal, HENT, Cardiovascular, Respiratory, Psychiatric, Allergic/Imm, Heme/Lymph   Last edited by Roselee Nova D, COT on 01/23/2020  2:01 PM. (History)       ALLERGIES Allergies  Allergen Reactions  . Canagliflozin Other (See Comments)    Vaginal yeast infections  . Nsaids Other (See Comments)    Yeast infection     PAST MEDICAL HISTORY Past Medical History:  Diagnosis Date  . Acid reflux   . Anxiety   . Arthritis   . 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 CAD (coronary artery disease) 11/13/2018   Late presentation anterior MI 12/19 >> LHC - dLM 25, mLAD 99, oOM2 100 (R-L collats), irreg RCA, EF 25-35 >> PCI: POBA to mLAD  . Chronic systolic CHF (congestive heart failure) ([place]) 11/28/2018   Ischemic CM // late presentation ant MI 10/2018 tx with POBA to LAD (residual CAD with CTO of the OM2) // Echo 12/19:  No mural apical thrombus, septal, apical mid ant and inf HK; mild LVH, EF 30-35, mild MR // Echo 01/2019: EF 30-35, Gr 1 DD, diff HK, apical AK, mild MR   . CKD (chronic kidney disease), stage I   . Diabetes mellitus type 1 ([place])   . Dyspnea   . Former tobacco use   . Gastroparesis   . Hypercholesteremia   . Hypertension   . Myocardial infarction ([place]) 2019  . Osteomyelitis ([place])    a. s/p R forefoot amputation.  . Renal failure   . Sciatica    Past Surgical History:  Procedure Laterality Date  . AMPUTATION Right 11/03/2011   Procedure: AMPUTATION RAY;  Surgeon: Jamesetta So;  Location: [facility];  Service: General;  Laterality: Right;  Right fourth and fifth metatarsal   . APPENDECTOMY    . CARDIAC CATHETERIZATION  10/2018  . CESAREAN SECTION  2004 and 2007   x2  . CORONARY/GRAFT  ACUTE MI REVASCULARIZATION N/A 11/11/2018   Procedure: CORONARY/GRAFT ACUTE MI REVASCULARIZATION;  Surgeon: Jettie Booze, MD;  Location: [facility];  Service: Cardiovascular;  Laterality: N/A;  . FRACTURE SURGERY  2000  . INJECTION OF SILICONE OIL Right [id number]   Procedure: Injection Of Silicone Oil;  Surgeon: Bernarda Caffey, MD;  Location: [facility];  Service: Ophthalmology;  Laterality: Right;  . LEFT HEART CATH AND CORONARY ANGIOGRAPHY N/A 11/11/2018   Procedure: LEFT HEART CATH AND CORONARY ANGIOGRAPHY;  Surgeon: Jettie Booze, MD;  Location: [facility];  Service: Cardiovascular;  Laterality: N/A;  . MEMBRANE PEEL Right 09/14/2019   Procedure: Antoine Primas;  Surgeon: Bernarda Caffey, MD;  Location: [facility];  Service: Ophthalmology;  Laterality: Right;  . PARS PLANA VITRECTOMY Right 09/14/2019   Procedure: [place] Vitrectomy With 25 Gauge;  Surgeon: Bernarda Caffey, MD;  Location: [facility];  Service: Ophthalmology;  Laterality: Right;  . PHOTOCOAGULATION WITH LASER Right 09/14/2019   Procedure: Photocoagulation With Laser;  Surgeon: Bernarda Caffey, MD;  Location: [facility];  Service: Ophthalmology;  Laterality: Right;  . 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   Procedure: REPAIR OF COMPLEX TRACTION RETINAL DETACHMENT;  Surgeon: Bernarda Caffey, MD;  Location: [facility];  Service: Ophthalmology;  Laterality: Right;  . TUBAL LIGATION      FAMILY HISTORY Family History  Problem Relation Age of Onset  . Diabetes Father   . Lung cancer Father   . Alcoholism Father   . Asthma Mother   . Kidney disease Mother   . Anemia Mother        hemolytic  . Hypertension Mother   . Heart attack Paternal Grandmother   . Diabetes Paternal Grandmother   . Diabetes Paternal Grandfather   . Anesthesia problems Neg Hx   . Hypotension Neg Hx   . Malignant hyperthermia Neg Hx   . Pseudochol deficiency Neg Hx     SOCIAL HISTORY Social History   Tobacco Use  .  Smoking status: Former Smoker    Packs/day: 0.25    Years: 20.00    Pack years: 5.00    Types: Cigarettes    Quit date: 12/29/2013    Years since quitting: 6.0  . Smokeless tobacco: Never Used  Substance Use Topics  . Alcohol use: No  . Drug use: No         OPHTHALMIC EXAM:  Base Eye Exam    Visual Acuity (Snellen - Linear)      Right Left   Dist [place] 20/200 -2 CF at 6''   Dist ph [place] 20/150 +1 NI   Correction: Glasses       Tonometry (Applanation, 2:11 PM)      Right Left   Pressure 56 17       Tonometry #2 (Applanation, 3:06 PM)      Right Left   Pressure 48        Tonometry Comments   1 drop cosopt and brimonidine @ 2:17 pm per Dr. Coralyn Pear.       Pupils      Dark Light Shape React APD   Right 4 4 Round Minimal None   Left 3 3 Round Minimal None       Visual Fields (Counting fingers)      Left Right   Restrictions Total superior temporal, inferior temporal, superior nasal, inferior nasal deficiencies Partial outer superior nasal, inferior nasal deficiencies       Extraocular Movement      Right Left    Full, Ortho Full, Ortho       Neuro/Psych    Oriented x3: Yes   Mood/Affect: Normal       Dilation    Both eyes: 1.0% Mydriacyl, 2.5% Phenylephrine @ 2:17 PM        Slit Lamp and Fundus Exam    Slit Lamp Exam      Right Left   Lids/Lashes Meibomian gland dysfunction Dermatochalasis - upper lid, Meibomian gland dysfunction   Conjunctiva/Sclera White and quiet White and quiet   Cornea 1-2+ inferior Punctate epithelial  erosions Clear   Anterior Chamber Deep and quiet Deep and quiet   Iris Round and dilated, No NVI Round and dilated, No NVI   Lens 1+ Nuclear sclerosis, 1+ Cortical cataract, trace PSC 1+ Nuclear sclerosis, 1+ Cortical cataract   Vitreous post vitrectomy; good silicon oil fill (approx 90-95%) Vitreous syneresis       Fundus Exam      Right Left   Disc Pink and Sharp, mild tilt obscured by fibrosis   C/D Ratio 0.5 no view   Macula  Flat under oil, fibrosis improved, shallow residual SRF inferiorly -- improving, pigment clumping temporal macula Closed wolf-jaw TRD with macular hole   Vessels Vascular attenuation, Tortuous, focal fibrosis superior to disc 4+ fibrotic NVD w/ TRD   Periphery Improvement in tractional fibrosis at 0100 above disc and along arcades, shallow SRF improving; scattered IRH and exudate, 360 PRP with good posterior fill in scattered PRP, almost total detachment -- extension of central/posterior TRD          IMAGING AND PROCEDURES  Imaging and Procedures for @TODAY @  OCT, Retina - OU - Both Eyes       Right Eye Quality was good. Central Foveal Thickness: 248. Progression has improved. Findings include abnormal foveal contour, subretinal fluid, epiretinal membrane, intraretinal hyper-reflective material, no IRF (stable improvement in TRD -- traction removed -- interval improvement in shallow, residual, inferior SRF).   Left Eye Quality was good. Central Foveal Thickness: 519. Progression has been stable. Findings include abnormal foveal contour, subretinal fluid, preretinal fibrosis, epiretinal membrane, vitreous traction.   Notes *Images captured and stored on drive  Diagnosis / Impression:  OD: stable improvement in TRD -- traction removed -- interval improvement in shallow, residual, inferior SRF OS: total macula-involving TRD  Clinical management:  See below  Abbreviations: NFP - Normal foveal profile. CME - cystoid macular edema. PED - pigment epithelial detachment. IRF - intraretinal fluid. SRF - subretinal fluid. EZ - ellipsoid zone. ERM - epiretinal membrane. ORA - outer retinal atrophy. ORT - outer retinal tubulation. SRHM - subretinal hyper-reflective material                 ASSESSMENT/PLAN:    ICD-10-CM   1. 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 Retinal edema  H35.81 OCT, Retina - OU - Both Eyes  3. Essential hypertension  I10   4. Hypertensive retinopathy of both eyes  H35.033   5. Combined forms of age-related cataract of both eyes  H25.813     1,2. 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)  - today, delayed follow up from 4 weeks to 11 weeks (from 12.11.20 to 3.2.21)  - formerly managed at [facility] -- s/p PRP OU -- last visit in 2017  - extensive history of severe disease and medical noncompliance  - exam showed and OCT confirmed TRD OU -- OD with inf macula and foveal involvement; OS with closed wolf-jaw total TRD with macular hole  - discussed findings and very poor prognosis given chronicity of problems  - BCVA stable at 20/150 OD   - S/P IVA #1 OD (10.16.20)  - s/p PPV/MP/EL/FAX/14% C3F8 OD, 10.22.20             - fibrosis improved and retina flattening under oil  - OCT shows persistent shallow SRF inferiorly -- improving slowly             -  IOP elevated at 56 (48 after 1 drop of Cosopt and brimonidine), but has not used drops for 1.5 weeks             - taper PF -- decrease to QDaily x7 days, then stop   - Cosopt BID OD -- increase to TID -- bottle given in clinic  - Brimonidine BID OD -- increase to TID  - PSO ung PRN             - avoid laying flat on back             - post op drop and positioning instructions reviewed              - tylenol for pain   - f/u 1 week, DFE, OCT -- IOP check  3,4. Hypertensive retinopathy OU  - discussed importance of tight BP control  - monitor  5. 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  - discussed diagnosis and progression  - not yet visually significant  - monitor for now   Ophthalmic Meds Ordered this visit:  Meds ordered this encounter  Medications  . brimonidine (ALPHAGAN) 0.2 % ophthalmic solution    Sig: Place 1 drop into the  right eye 3 (three) times daily.    Dispense:  15 mL    Refill:  3  . dorzolamide-timolol (COSOPT) 22.3-6.8 MG/ML ophthalmic solution    Sig: Place 1 drop into the right eye 3 (three) times daily.    Dispense:  10 mL    Refill:  3       Return in about 1 week (around 01/30/2020) for f/u IOP check OD.  There are no Patient Instructions on file for this visit.   Explained the diagnoses, plan, and follow up with the patient and they expressed understanding.  Patient expressed understanding of the importance of proper follow up care.   This document serves as a record of services personally performed by Gardiner Sleeper, MD, PhD. It was created on their behalf by Estill Bakes, COT an ophthalmic technician. The creation of this record is the provider's dictation and/or activities during the visit.    Electronically signed by: Estill Bakes, COT 01/22/20 @ 5:07 PM  Gardiner Sleeper, M.D., Ph.D. Diseases & Surgery of the Retina and [place] 01/23/2020   I have reviewed the above documentation for accuracy and completeness, and I agree with the above. Gardiner Sleeper, M.D., Ph.D. 01/23/20 5:07 PM   Abbreviations: M myopia (nearsighted); A astigmatism; H hyperopia (farsighted); P presbyopia; Mrx spectacle prescription;  CTL contact lenses; OD right eye; OS left eye; OU both eyes  XT exotropia; ET esotropia; PEK punctate epithelial keratitis; PEE punctate epithelial erosions; DES dry eye syndrome; MGD meibomian gland dysfunction; ATs artificial tears; PFAT's preservative free artificial tears; [place] nuclear sclerotic cataract; PSC posterior subcapsular cataract; ERM epi-retinal membrane; PVD posterior vitreous detachment; RD retinal detachment; DM diabetes mellitus; DR diabetic retinopathy; NPDR non-proliferative diabetic retinopathy; PDR proliferative diabetic retinopathy; CSME clinically significant macular edema; DME diabetic macular edema; dbh dot blot  hemorrhages; CWS cotton wool spot; POAG primary open angle glaucoma; C/D cup-to-disc ratio; HVF humphrey visual field; GVF goldmann visual field; OCT optical coherence tomography; IOP intraocular pressure; BRVO Branch retinal vein occlusion; CRVO central retinal vein occlusion; CRAO central retinal artery occlusion; BRAO branch retinal artery occlusion; RT retinal tear; SB scleral  buckle; PPV pars plana vitrectomy; VH Vitreous hemorrhage; PRP panretinal laser photocoagulation; IVK intravitreal kenalog; VMT vitreomacular traction; MH Macular hole;  NVD neovascularization of the disc; NVE neovascularization elsewhere; AREDS age related eye disease study; ARMD age related macular degeneration; POAG primary open angle glaucoma; EBMD epithelial/anterior basement membrane dystrophy; ACIOL anterior chamber intraocular lens; IOL intraocular lens; PCIOL posterior chamber intraocular lens; Phaco/IOL phacoemulsification with intraocular lens placement; [place] photorefractive keratectomy; LASIK laser assisted in situ keratomileusis; HTN hypertension; DM diabetes mellitus; COPD chronic obstructive pulmonary disease

## 2020-01-23 ENCOUNTER — Other Ambulatory Visit: Payer: Self-pay

## 2020-01-23 ENCOUNTER — Encounter (INDEPENDENT_AMBULATORY_CARE_PROVIDER_SITE_OTHER): Payer: Self-pay | Admitting: Ophthalmology

## 2020-01-23 ENCOUNTER — Ambulatory Visit (INDEPENDENT_AMBULATORY_CARE_PROVIDER_SITE_OTHER): Payer: Medicaid Other | Admitting: Ophthalmology

## 2020-01-23 DIAGNOSIS — H3581 Retinal edema: Secondary | ICD-10-CM

## 2020-01-23 DIAGNOSIS — E113523 Type 2 diabetes mellitus with proliferative diabetic retinopathy with traction retinal detachment involving the macula, bilateral: Secondary | ICD-10-CM

## 2020-01-23 DIAGNOSIS — I10 Essential (primary) hypertension: Secondary | ICD-10-CM | POA: Diagnosis not present

## 2020-01-23 DIAGNOSIS — H35033 Hypertensive retinopathy, bilateral: Secondary | ICD-10-CM

## 2020-01-23 DIAGNOSIS — H25813 Combined forms of age-related cataract, bilateral: Secondary | ICD-10-CM

## 2020-01-23 MED ORDER — BRIMONIDINE TARTRATE 0.2 % OP SOLN: 1.0000 [drp] | Freq: Three times a day (TID) | OPHTHALMIC | 3 refills | Status: DC

## 2020-01-23 MED ORDER — DORZOLAMIDE HCL-TIMOLOL MAL 2-0.5 % OP SOLN: 1.0000 [drp] | Freq: Three times a day (TID) | OPHTHALMIC | 3 refills | Status: DC

## 2020-01-30 ENCOUNTER — Encounter (INDEPENDENT_AMBULATORY_CARE_PROVIDER_SITE_OTHER): Payer: Medicaid Other | Admitting: Ophthalmology

## 2020-01-31 ENCOUNTER — Encounter (INDEPENDENT_AMBULATORY_CARE_PROVIDER_SITE_OTHER): Payer: Medicaid Other | Admitting: Ophthalmology

## 2020-02-09 ENCOUNTER — Ambulatory Visit (INDEPENDENT_AMBULATORY_CARE_PROVIDER_SITE_OTHER): Payer: Medicaid Other | Admitting: Ophthalmology

## 2020-02-09 ENCOUNTER — Encounter (INDEPENDENT_AMBULATORY_CARE_PROVIDER_SITE_OTHER): Payer: Self-pay | Admitting: Ophthalmology

## 2020-02-09 DIAGNOSIS — H35033 Hypertensive retinopathy, bilateral: Secondary | ICD-10-CM | POA: Diagnosis not present

## 2020-02-09 DIAGNOSIS — I10 Essential (primary) hypertension: Secondary | ICD-10-CM

## 2020-02-09 DIAGNOSIS — H3581 Retinal edema: Secondary | ICD-10-CM | POA: Diagnosis not present

## 2020-02-09 DIAGNOSIS — E113523 Type 2 diabetes mellitus with proliferative diabetic retinopathy with traction retinal detachment involving the macula, bilateral: Secondary | ICD-10-CM | POA: Diagnosis not present

## 2020-02-09 DIAGNOSIS — H25813 Combined forms of age-related cataract, bilateral: Secondary | ICD-10-CM

## 2020-02-09 NOTE — Progress Notes (Signed)
[facility] Note  02/09/2020     CHIEF COMPLAINT Patient presents for Retina Follow Up   HISTORY OF PRESENT ILLNESS: Anne [name] is a 39 y.o. female who presents to the clinic today for:   HPI    Retina Follow Up    Patient presents with  Other.  In right eye.  This started 1 week ago.  I, the attending physician,  performed the HPI with the patient and updated documentation appropriately.          Comments    Patient here for 1 week (over due)retina follow up. Patient states vision doing alright. Has headaches right side but not eye pain. Eye burn.        Last edited by Bernarda Caffey, MD on 02/09/2020 10:47 AM. (History)    pt states   Referring physician: Alfonse Flavors, MD [address] [place],  [place] [address]  HISTORICAL INFORMATION:   Selected notes from the MEDICAL RECORD NUMBER Referred by Dr. Stevenson Clinch for concern of VH / TRD OS   CURRENT MEDICATIONS: Current Outpatient Medications (Ophthalmic Drugs)  Medication Sig  . brimonidine (ALPHAGAN) 0.2 % ophthalmic solution Place 1 drop into the right eye 3 (three) times daily.  . dorzolamide-timolol (COSOPT) 22.3-6.8 MG/ML ophthalmic solution Place 1 drop into the right eye 3 (three) times daily.  . prednisoLONE acetate (PRED FORTE) 1 % ophthalmic suspension Place 1 drop into the right eye 4 (four) times daily. (Patient taking differently: Place 1 drop into the right eye in the morning and at bedtime. )   No current facility-administered medications for this visit. (Ophthalmic Drugs)   Current Outpatient Medications (Other)  Medication Sig  . albuterol (VENTOLIN HFA) 108 (90 Base) MCG/ACT inhaler Inhale 2 puffs into the lungs every 6 (six) hours as needed for wheezing or shortness of breath.  Marland Kitchen aspirin (GNP ASPIRIN LOW DOSE) 81 MG EC tablet Take 1 tablet (81 mg total) by mouth daily with breakfast. Swallow whole.  Marland Kitchen atorvastatin (LIPITOR) 80 MG tablet Take 1 tablet  (80 mg total) by mouth every evening.  . carvedilol (COREG) 6.25 MG tablet Take 1.5 tablets (9.375 mg total) by mouth 2 (two) times daily.  . fenofibrate (TRICOR) 145 MG tablet Take 1 tablet (145 mg total) by mouth daily.  . furosemide (LASIX) 40 MG tablet Take 1 tablet (40 mg total) by mouth 2 (two) times daily.  . isosorbide mononitrate (IMDUR) 30 MG 24 hr tablet Take 1 tablet (30 mg total) by mouth daily.  Marland Kitchen LANTUS SOLOSTAR 100 UNIT/ML Solostar Pen INNJECT 50 UNITS S.Q. ONCE DAILY AT 10 P.M.  . losartan (COZAAR) 25 MG tablet Take 1 tablet (25 mg total) by mouth every evening.  . metFORMIN (GLUCOPHAGE) 1000 MG tablet Take 1,000 mg by mouth 2 (two) times daily with a meal.  . metoCLOPramide (REGLAN) 5 MG tablet Take 5 mg by mouth 2 (two) times daily.  . nitroGLYCERIN (NITROSTAT) 0.4 MG SL tablet Place 1 tablet (0.4 mg total) under the tongue every 5 (five) minutes as needed.  Marland Kitchen NOVOLOG FLEXPEN 100 UNIT/ML FlexPen INJECT 12-18 UNITS S.Q. THREE TIMES DAILY WITH MEALS.  Marland Kitchen ondansetron (ZOFRAN ODT) 4 MG disintegrating tablet Take 1 tablet (4 mg total) by mouth every 8 (eight) hours as needed for nausea or vomiting.  . potassium chloride (K-DUR) 10 MEQ tablet Take 1 tablet (10 mEq total) by mouth 2 (two) times daily.  . pregabalin (LYRICA) 75 MG  capsule Take 75 mg by mouth 2 (two) times daily.  . promethazine (PHENERGAN) 12.5 MG suppository Place 12.5 mg rectally as needed for nausea or vomiting.  . sertraline (ZOLOFT) 100 MG tablet Take 200 mg by mouth at bedtime.   Marland Kitchen spironolactone (ALDACTONE) 25 MG tablet Take 0.5 tablets (12.5 mg total) by mouth daily.  . ticagrelor ([name]) 90 MG TABS tablet Take 1 tablet (90 mg total) by mouth 2 (two) times daily.   No current facility-administered medications for this visit. (Other)      REVIEW OF SYSTEMS: ROS    Positive for: Endocrine, Eyes   Negative for: Constitutional, Gastrointestinal, Neurological, Skin, Genitourinary, Musculoskeletal, HENT,  Cardiovascular, Respiratory, Psychiatric, Allergic/Imm, Heme/Lymph   Last edited by Theodore Demark, COA on 02/09/2020 10:12 AM. (History)       ALLERGIES Allergies  Allergen Reactions  . Canagliflozin Other (See Comments)    Vaginal yeast infections  . Nsaids Other (See Comments)    Yeast infection     PAST MEDICAL HISTORY Past Medical History:  Diagnosis Date  . Acid reflux   . Anxiety   . Arthritis   . 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 CAD (coronary artery disease) 11/13/2018   Late presentation anterior MI 12/19 >> LHC - dLM 25, mLAD 99, oOM2 100 (R-L collats), irreg RCA, EF 25-35 >> PCI: POBA to mLAD  . Chronic systolic CHF (congestive heart failure) ([place]) 11/28/2018   Ischemic CM // late presentation ant MI 10/2018 tx with POBA to LAD (residual CAD with CTO of the OM2) // Echo 12/19:  No mural apical thrombus, septal, apical mid ant and inf HK; mild LVH, EF 30-35, mild MR // Echo 01/2019: EF 30-35, Gr 1 DD, diff HK, apical AK, mild MR   . CKD (chronic kidney disease), stage I   . Diabetes mellitus type 1 ([place])   . Dyspnea   . Former tobacco use   . Gastroparesis   . Hypercholesteremia   . Hypertension   . Myocardial infarction ([place]) 2019  . Osteomyelitis ([place])    a. s/p R forefoot amputation.  . Renal failure   . Sciatica    Past Surgical History:  Procedure Laterality Date  . AMPUTATION Right 11/03/2011   Procedure: AMPUTATION RAY;  Surgeon: Jamesetta So;  Location: [facility];  Service: General;  Laterality: Right;  Right fourth and fifth metatarsal   . APPENDECTOMY    . CARDIAC CATHETERIZATION  10/2018  . CESAREAN SECTION  2004 and 2007   x2  . CORONARY/GRAFT ACUTE MI REVASCULARIZATION N/A 11/11/2018   Procedure: CORONARY/GRAFT ACUTE MI REVASCULARIZATION;  Surgeon: Jettie Booze, MD;  Location: [facility];  Service: Cardiovascular;  Laterality: N/A;  . FRACTURE SURGERY  2000  . INJECTION OF SILICONE OIL Right  [id number]   Procedure: Injection Of Silicone Oil;  Surgeon: Bernarda Caffey, MD;  Location: [facility];  Service: Ophthalmology;  Laterality: Right;  . LEFT HEART CATH AND CORONARY ANGIOGRAPHY N/A 11/11/2018   Procedure: LEFT HEART CATH AND CORONARY ANGIOGRAPHY;  Surgeon: Jettie Booze, MD;  Location: [facility];  Service: Cardiovascular;  Laterality: N/A;  . MEMBRANE PEEL Right 09/14/2019   Procedure: Antoine Primas;  Surgeon: Bernarda Caffey, MD;  Location: [facility];  Service: Ophthalmology;  Laterality: Right;  . PARS PLANA VITRECTOMY Right 09/14/2019   Procedure: [place] Vitrectomy With 25 Gauge;  Surgeon: Bernarda Caffey, MD;  Location: [facility];  Service: Ophthalmology;  Laterality: Right;  . PHOTOCOAGULATION WITH LASER Right 09/14/2019   Procedure: Photocoagulation With Laser;  Surgeon: Bernarda Caffey, MD;  Location: [facility];  Service: Ophthalmology;  Laterality: Right;  . 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   Procedure: REPAIR OF COMPLEX TRACTION RETINAL DETACHMENT;  Surgeon: Bernarda Caffey, MD;  Location: [facility];  Service: Ophthalmology;  Laterality: Right;  . TUBAL LIGATION      FAMILY HISTORY Family History  Problem Relation Age of Onset  . Diabetes Father   . Lung cancer Father   . Alcoholism Father   . Asthma Mother   . Kidney disease Mother   . Anemia Mother        hemolytic  . Hypertension Mother   . Heart attack Paternal Grandmother   . Diabetes Paternal Grandmother   . Diabetes Paternal Grandfather   . Anesthesia problems Neg Hx   . Hypotension Neg Hx   . Malignant hyperthermia Neg Hx   . Pseudochol deficiency Neg Hx     SOCIAL HISTORY Social History   Tobacco Use  . Smoking status: Former Smoker    Packs/day: 0.25    Years: 20.00    Pack years: 5.00    Types: Cigarettes    Quit date: 12/29/2013    Years since quitting: 6.1  . Smokeless tobacco: Never Used  Substance Use Topics  . Alcohol use: No  . Drug use: No          OPHTHALMIC EXAM:  Base Eye Exam    Visual Acuity (Snellen - Linear)      Right Left   Dist [place] 20/200 -1 CF at 1'   Dist ph [place] 20/150 -1 NI       Tonometry (Tonopen, 10:08 AM)      Right Left   Pressure 20 13       Pupils      Dark Light Shape React APD   Right 4 4 Round Minimal None   Left 3 3 Round Minimal None       Visual Fields      Left Right   Restrictions Total superior temporal, inferior temporal, superior nasal, inferior nasal deficiencies Partial outer superior nasal, inferior nasal deficiencies       Extraocular Movement      Right Left    Full, Ortho Full, Ortho       Neuro/Psych    Oriented x3: Yes   Mood/Affect: Normal       Dilation    Both eyes: 1.0% Mydriacyl, 2.5% Phenylephrine @ 10:08 AM        Slit Lamp and Fundus Exam    Slit Lamp Exam      Right Left   Lids/Lashes Meibomian gland dysfunction Dermatochalasis - upper lid, Meibomian gland dysfunction   Conjunctiva/Sclera White and quiet White and quiet   Cornea 1+ inferior Punctate epithelial erosions Clear   Anterior Chamber Deep and quiet Deep and quiet   Iris Round and dilated, No NVI Round and dilated, No NVI   Lens 1-2+ Nuclear sclerosis, 1+ Cortical cataract, 1+PSC 1+ Nuclear sclerosis, 1+ Cortical cataract   Vitreous post vitrectomy; good silicon oil fill (approx 90-95%) Vitreous syneresis       Fundus Exam      Right Left   Disc Pink and Sharp, mild tilt obscured by fibrosis   C/D Ratio 0.5 no view   Macula Flat under oil, fibrosis improved, shallow residual SRF inferiorly -- improving, pigment clumping temporal  macula Closed wolf-jaw TRD with macular hole   Vessels Vascular attenuation, Tortuous, focal fibrosis superior to disc 4+ fibrotic NVD w/ TRD   Periphery Improvement in tractional fibrosis at 0100 above disc and along arcades, shallow SRF improving; scattered IRH and exudate, 360 PRP with good posterior fill in scattered PRP, almost total detachment -- extension of  central/posterior TRD          IMAGING AND PROCEDURES  Imaging and Procedures for @TODAY @  OCT, Retina - OU - Both Eyes       Right Eye Quality was good. Central Foveal Thickness: 242. Progression has been stable. Findings include abnormal foveal contour, subretinal fluid, epiretinal membrane, intraretinal hyper-reflective material, no IRF (stable improvement in TRD -- traction removed -- interval improvement in shallow, residual, inferior SRF).   Left Eye Quality was good. Progression has been stable. Findings include abnormal foveal contour, subretinal fluid, preretinal fibrosis, epiretinal membrane, vitreous traction.   Notes *Images captured and stored on drive  Diagnosis / Impression:  OD: stable improvement in TRD -- traction removed -- interval improvement in shallow, residual, inferior SRF OS: total macula-involving TRD  Clinical management:  See below  Abbreviations: NFP - Normal foveal profile. CME - cystoid macular edema. PED - pigment epithelial detachment. IRF - intraretinal fluid. SRF - subretinal fluid. EZ - ellipsoid zone. ERM - epiretinal membrane. ORA - outer retinal atrophy. ORT - outer retinal tubulation. SRHM - subretinal hyper-reflective material                 ASSESSMENT/PLAN:    ICD-10-CM   1. 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 Retinal edema  H35.81 OCT, Retina - OU - Both Eyes  3. Essential hypertension  I10   4. Hypertensive retinopathy of both eyes  H35.033   5. Combined forms of age-related cataract of both eyes  H25.813     1,2. 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)  - delayed follow up from 4 weeks to 11 weeks (from 12.11.20 to 3.2.21)  - formerly managed at [facility] -- s/p PRP OU -- last visit in 2017  - extensive history of severe disease and medical  noncompliance  - exam showed and OCT confirmed TRD OU -- OD with inf macula and foveal involvement; OS with closed wolf-jaw total TRD with macular hole  - discussed findings and very poor prognosis given chronicity of problems  - BCVA stable at 20/150 OD   - S/P IVA #1 OD (10.16.20)  - s/p PPV/MP/EL/FAX/14% C3F8 OD, 10.22.20             - fibrosis improved and retina flattening under oil  - OCT shows persistent shallow SRF inferiorly -- improving slowly             - IOP okay today at 20, was elevated at 56 last visit on 03.02.21             - completed PF taper    - continue Cosopt TID OD  - continue Brimonidine TID OD  - continue PSO ung PRN             - avoid laying flat on back  - f/u 4-6 weeks, DFE, OCT   3,4. Hypertensive retinopathy OU  - discussed importance of tight BP control  - monitor  5. Mixed form age related cataract OU  - The  symptoms of cataract, surgical options, and treatments and risks were discussed with patient.  - discussed diagnosis and progression  - not yet visually significant  - monitor for now   Ophthalmic Meds Ordered this visit:  No orders of the defined types were placed in this encounter.      Return for f/u 4-6 weeks, PDR OU, DFE, OCT.  There are no Patient Instructions on file for this visit.   Explained the diagnoses, plan, and follow up with the patient and they expressed understanding.  Patient expressed understanding of the importance of proper follow up care.   This document serves as a record of services personally performed by Gardiner Sleeper, MD, PhD. It was created on their behalf by Ernest Mallick, OA, an ophthalmic assistant. The creation of this record is the provider's dictation and/or activities during the visit.    Electronically signed by: Ernest Mallick, OA 03.19.2021 12:54 PM  Gardiner Sleeper, M.D., Ph.D. Diseases & Surgery of the Retina and Vitreous [facility]  I have reviewed the above  documentation for accuracy and completeness, and I agree with the above. Gardiner Sleeper, M.D., Ph.D. 02/09/20 12:54 PM   Abbreviations: M myopia (nearsighted); A astigmatism; H hyperopia (farsighted); P presbyopia; Mrx spectacle prescription;  CTL contact lenses; OD right eye; OS left eye; OU both eyes  XT exotropia; ET esotropia; PEK punctate epithelial keratitis; PEE punctate epithelial erosions; DES dry eye syndrome; MGD meibomian gland dysfunction; ATs artificial tears; PFAT's preservative free artificial tears; [place] nuclear sclerotic cataract; PSC posterior subcapsular cataract; ERM epi-retinal membrane; PVD posterior vitreous detachment; RD retinal detachment; DM diabetes mellitus; DR diabetic retinopathy; NPDR non-proliferative diabetic retinopathy; PDR proliferative diabetic retinopathy; CSME clinically significant macular edema; DME diabetic macular edema; dbh dot blot hemorrhages; CWS cotton wool spot; POAG primary open angle glaucoma; C/D cup-to-disc ratio; HVF humphrey visual field; GVF goldmann visual field; OCT optical coherence tomography; IOP intraocular pressure; BRVO Branch retinal vein occlusion; CRVO central retinal vein occlusion; CRAO central retinal artery occlusion; BRAO branch retinal artery occlusion; RT retinal tear; SB scleral buckle; PPV pars plana vitrectomy; VH Vitreous hemorrhage; PRP panretinal laser photocoagulation; IVK intravitreal kenalog; VMT vitreomacular traction; MH Macular hole;  NVD neovascularization of the disc; NVE neovascularization elsewhere; AREDS age related eye disease study; ARMD age related macular degeneration; POAG primary open angle glaucoma; EBMD epithelial/anterior basement membrane dystrophy; ACIOL anterior chamber intraocular lens; IOL intraocular lens; PCIOL posterior chamber intraocular lens; Phaco/IOL phacoemulsification with intraocular lens placement; [place] photorefractive keratectomy; LASIK laser assisted in situ keratomileusis; HTN hypertension; DM  diabetes mellitus; COPD chronic obstructive pulmonary disease

## 2020-03-08 ENCOUNTER — Emergency Department (HOSPITAL_COMMUNITY): Payer: Medicare Other

## 2020-03-08 ENCOUNTER — Other Ambulatory Visit: Payer: Self-pay

## 2020-03-08 ENCOUNTER — Inpatient Hospital Stay (HOSPITAL_COMMUNITY)
Admission: EM | Admit: 2020-03-08 | Discharge: 2020-03-12 | DRG: 074 | Disposition: A | Discharge status: Home / Self Care | Payer: Medicare Other | Attending: Internal Medicine | Admitting: Internal Medicine

## 2020-03-08 ENCOUNTER — Encounter (HOSPITAL_COMMUNITY): Payer: Self-pay

## 2020-03-08 DIAGNOSIS — Z888 Allergy status to other drugs, medicaments and biological substances status: Secondary | ICD-10-CM

## 2020-03-08 DIAGNOSIS — I10 Essential (primary) hypertension: Secondary | ICD-10-CM | POA: Diagnosis present

## 2020-03-08 DIAGNOSIS — Z811 Family history of alcohol abuse and dependence: Secondary | ICD-10-CM

## 2020-03-08 DIAGNOSIS — Z801 Family history of malignant neoplasm of trachea, bronchus and lung: Secondary | ICD-10-CM

## 2020-03-08 DIAGNOSIS — E7800 Pure hypercholesterolemia, unspecified: Secondary | ICD-10-CM | POA: Diagnosis present

## 2020-03-08 DIAGNOSIS — Z841 Family history of disorders of kidney and ureter: Secondary | ICD-10-CM

## 2020-03-08 DIAGNOSIS — I249 Acute ischemic heart disease, unspecified: Secondary | ICD-10-CM | POA: Diagnosis not present

## 2020-03-08 DIAGNOSIS — I25118 Atherosclerotic heart disease of native coronary artery with other forms of angina pectoris: Secondary | ICD-10-CM | POA: Diagnosis present

## 2020-03-08 DIAGNOSIS — Z8249 Family history of ischemic heart disease and other diseases of the circulatory system: Secondary | ICD-10-CM

## 2020-03-08 DIAGNOSIS — E1159 Type 2 diabetes mellitus with other circulatory complications: Secondary | ICD-10-CM | POA: Diagnosis present

## 2020-03-08 DIAGNOSIS — M5430 Sciatica, unspecified side: Secondary | ICD-10-CM | POA: Diagnosis present

## 2020-03-08 DIAGNOSIS — Z87891 Personal history of nicotine dependence: Secondary | ICD-10-CM

## 2020-03-08 DIAGNOSIS — R112 Nausea with vomiting, unspecified: Secondary | ICD-10-CM | POA: Diagnosis present

## 2020-03-08 DIAGNOSIS — E1051 Type 1 diabetes mellitus with diabetic peripheral angiopathy without gangrene: Secondary | ICD-10-CM | POA: Diagnosis present

## 2020-03-08 DIAGNOSIS — I241 Dressler's syndrome: Secondary | ICD-10-CM | POA: Diagnosis present

## 2020-03-08 DIAGNOSIS — Z89431 Acquired absence of right foot: Secondary | ICD-10-CM

## 2020-03-08 DIAGNOSIS — K3184 Gastroparesis: Secondary | ICD-10-CM | POA: Diagnosis present

## 2020-03-08 DIAGNOSIS — E1043 Type 1 diabetes mellitus with diabetic autonomic (poly)neuropathy: Principal | ICD-10-CM | POA: Diagnosis present

## 2020-03-08 DIAGNOSIS — Z79899 Other long term (current) drug therapy: Secondary | ICD-10-CM

## 2020-03-08 DIAGNOSIS — F419 Anxiety disorder, unspecified: Secondary | ICD-10-CM | POA: Diagnosis present

## 2020-03-08 DIAGNOSIS — M1990 Unspecified osteoarthritis, unspecified site: Secondary | ICD-10-CM | POA: Diagnosis present

## 2020-03-08 DIAGNOSIS — K219 Gastro-esophageal reflux disease without esophagitis: Secondary | ICD-10-CM | POA: Diagnosis present

## 2020-03-08 DIAGNOSIS — Z794 Long term (current) use of insulin: Secondary | ICD-10-CM

## 2020-03-08 DIAGNOSIS — I5022 Chronic systolic (congestive) heart failure: Secondary | ICD-10-CM | POA: Diagnosis present

## 2020-03-08 DIAGNOSIS — I130 Hypertensive heart and chronic kidney disease with heart failure and stage 1 through stage 4 chronic kidney disease, or unspecified chronic kidney disease: Secondary | ICD-10-CM | POA: Diagnosis present

## 2020-03-08 DIAGNOSIS — I252 Old myocardial infarction: Secondary | ICD-10-CM

## 2020-03-08 DIAGNOSIS — N182 Chronic kidney disease, stage 2 (mild): Secondary | ICD-10-CM | POA: Diagnosis present

## 2020-03-08 DIAGNOSIS — I2510 Atherosclerotic heart disease of native coronary artery without angina pectoris: Secondary | ICD-10-CM | POA: Diagnosis present

## 2020-03-08 DIAGNOSIS — E1022 Type 1 diabetes mellitus with diabetic chronic kidney disease: Secondary | ICD-10-CM | POA: Diagnosis present

## 2020-03-08 DIAGNOSIS — E785 Hyperlipidemia, unspecified: Secondary | ICD-10-CM | POA: Diagnosis present

## 2020-03-08 DIAGNOSIS — R079 Chest pain, unspecified: Secondary | ICD-10-CM

## 2020-03-08 DIAGNOSIS — Z833 Family history of diabetes mellitus: Secondary | ICD-10-CM

## 2020-03-08 DIAGNOSIS — Z825 Family history of asthma and other chronic lower respiratory diseases: Secondary | ICD-10-CM

## 2020-03-08 DIAGNOSIS — Z20822 Contact with and (suspected) exposure to covid-19: Secondary | ICD-10-CM | POA: Diagnosis present

## 2020-03-08 DIAGNOSIS — K529 Noninfective gastroenteritis and colitis, unspecified: Secondary | ICD-10-CM | POA: Diagnosis present

## 2020-03-08 DIAGNOSIS — Z955 Presence of coronary angioplasty implant and graft: Secondary | ICD-10-CM

## 2020-03-08 DIAGNOSIS — Z7902 Long term (current) use of antithrombotics/antiplatelets: Secondary | ICD-10-CM

## 2020-03-08 DIAGNOSIS — Z7982 Long term (current) use of aspirin: Secondary | ICD-10-CM

## 2020-03-08 LAB — BASIC METABOLIC PANEL
Anion gap: 11 (ref 5–15)
BUN: 20 mg/dL (ref 6–20)
CO2: 23 mmol/L (ref 22–32)
Calcium: 8.8 mg/dL — ABNORMAL LOW (ref 8.9–10.3)
Chloride: 97 mmol/L — ABNORMAL LOW (ref 98–111)
Creatinine, Ser: 1.27 mg/dL — ABNORMAL HIGH (ref 0.44–1.00)
GFR calc Af Amer: >60 mL/min (ref >60)
GFR calc non Af Amer: 53 mL/min — ABNORMAL LOW (ref >60)
Glucose, Bld: 315 mg/dL — ABNORMAL HIGH (ref 70–99)
Potassium: 4.2 mmol/L (ref 3.5–5.1)
Sodium: 131 mmol/L — ABNORMAL LOW (ref 135–145)

## 2020-03-08 LAB — CBC
HCT: 36.9 % (ref 36.0–46.0)
Hemoglobin: 12.7 g/dL (ref 12.0–15.0)
MCH: 30.4 pg (ref 26.0–34.0)
MCHC: 34.4 g/dL (ref 30.0–36.0)
MCV: 88.3 fL (ref 80.0–100.0)
Platelets: 278 10*3/uL (ref 150–400)
RBC: 4.18 MIL/uL (ref 3.87–5.11)
RDW: 13.1 % (ref 11.5–15.5)
WBC: 10.9 10*3/uL — ABNORMAL HIGH (ref 4.0–10.5)
nRBC: 0 % (ref 0.0–0.2)

## 2020-03-08 LAB — TROPONIN I (HIGH SENSITIVITY)
Troponin I (High Sensitivity): 4 ng/L (ref <18)
Troponin I (High Sensitivity): 4 ng/L (ref <18)

## 2020-03-08 LAB — BRAIN NATRIURETIC PEPTIDE: B Natriuretic Peptide: 47 pg/mL (ref 0.0–100.0)

## 2020-03-08 LAB — GLUCOSE, CAPILLARY: Glucose-Capillary: 170 mg/dL — ABNORMAL HIGH (ref 70–99)

## 2020-03-08 LAB — CBG MONITORING, ED: Glucose-Capillary: 195 mg/dL — ABNORMAL HIGH (ref 70–99)

## 2020-03-08 MED ORDER — FENOFIBRATE 160 MG PO TABS
160.0000 mg | ORAL_TABLET | Freq: Every day | ORAL | Status: DC
  Administered 2020-03-09 – 2020-03-12 (×3): 160 mg via ORAL
  Filled 2020-03-08 (×4): qty 1

## 2020-03-08 MED ORDER — PROMETHAZINE HCL 25 MG RE SUPP: 12.5000 mg | RECTAL | Status: DC | PRN

## 2020-03-08 MED ORDER — ONDANSETRON HCL 4 MG/2ML IJ SOLN
4.0000 mg | Freq: Four times a day (QID) | INTRAMUSCULAR | Status: DC | PRN
  Administered 2020-03-08 – 2020-03-09 (×3): 4 mg via INTRAVENOUS
  Filled 2020-03-08 (×5): qty 2

## 2020-03-08 MED ORDER — NITROGLYCERIN 2 % TD OINT
1.0000 [in_us] | TOPICAL_OINTMENT | Freq: Four times a day (QID) | TRANSDERMAL | Status: AC
  Administered 2020-03-09: 1 [in_us] via TOPICAL
  Filled 2020-03-08: qty 1

## 2020-03-08 MED ORDER — METOCLOPRAMIDE HCL 10 MG PO TABS
5.0000 mg | ORAL_TABLET | Freq: Two times a day (BID) | ORAL | Status: DC
  Administered 2020-03-09: 5 mg via ORAL
  Filled 2020-03-08: qty 1

## 2020-03-08 MED ORDER — ENOXAPARIN SODIUM 40 MG/0.4ML ~~LOC~~ SOLN: 40.0000 mg | SUBCUTANEOUS | Status: DC

## 2020-03-08 MED ORDER — LOSARTAN POTASSIUM 50 MG PO TABS
25.0000 mg | ORAL_TABLET | Freq: Every evening | ORAL | Status: DC
  Administered 2020-03-11: 25 mg via ORAL
  Filled 2020-03-08 (×2): qty 1

## 2020-03-08 MED ORDER — ALBUTEROL SULFATE (2.5 MG/3ML) 0.083% IN NEBU: 2.5000 mg | INHALATION_SOLUTION | Freq: Four times a day (QID) | RESPIRATORY_TRACT | Status: DC | PRN

## 2020-03-08 MED ORDER — SPIRONOLACTONE 25 MG PO TABS
12.5000 mg | ORAL_TABLET | Freq: Every day | ORAL | Status: DC
  Administered 2020-03-09 – 2020-03-12 (×3): 12.5 mg via ORAL
  Filled 2020-03-08 (×6): qty 1

## 2020-03-08 MED ORDER — ASPIRIN 325 MG PO TABS
325.0000 mg | ORAL_TABLET | Freq: Once | ORAL | Status: AC
  Administered 2020-03-08: 325 mg via ORAL
  Filled 2020-03-08: qty 1

## 2020-03-08 MED ORDER — BRIMONIDINE TARTRATE 0.2 % OP SOLN
1.0000 [drp] | Freq: Three times a day (TID) | OPHTHALMIC | Status: DC
  Administered 2020-03-09 – 2020-03-12 (×10): 1 [drp] via OPHTHALMIC
  Filled 2020-03-08 (×2): qty 5

## 2020-03-08 MED ORDER — SERTRALINE HCL 50 MG PO TABS
200.0000 mg | ORAL_TABLET | Freq: Every day | ORAL | Status: DC
  Administered 2020-03-09 – 2020-03-11 (×3): 200 mg via ORAL
  Filled 2020-03-08 (×3): qty 4

## 2020-03-08 MED ORDER — MORPHINE SULFATE (PF) 4 MG/ML IV SOLN
4.0000 mg | Freq: Once | INTRAVENOUS | Status: AC
  Administered 2020-03-08: 4 mg via INTRAVENOUS
  Filled 2020-03-08: qty 1

## 2020-03-08 MED ORDER — FUROSEMIDE 40 MG PO TABS
40.0000 mg | ORAL_TABLET | Freq: Two times a day (BID) | ORAL | Status: DC
  Administered 2020-03-09 – 2020-03-12 (×4): 40 mg via ORAL
  Filled 2020-03-08 (×6): qty 1

## 2020-03-08 MED ORDER — DORZOLAMIDE HCL-TIMOLOL MAL 2-0.5 % OP SOLN
1.0000 [drp] | Freq: Three times a day (TID) | OPHTHALMIC | Status: DC
  Administered 2020-03-09 – 2020-03-12 (×10): 1 [drp] via OPHTHALMIC
  Filled 2020-03-08 (×2): qty 10

## 2020-03-08 MED ORDER — ONDANSETRON HCL 4 MG/2ML IJ SOLN
4.0000 mg | Freq: Once | INTRAMUSCULAR | Status: AC
  Administered 2020-03-08: 4 mg via INTRAVENOUS
  Filled 2020-03-08: qty 2

## 2020-03-08 MED ORDER — INSULIN ASPART 100 UNIT/ML ~~LOC~~ SOLN
0.0000 [IU] | Freq: Three times a day (TID) | SUBCUTANEOUS | Status: DC
  Administered 2020-03-09: 5 [IU] via SUBCUTANEOUS
  Administered 2020-03-09 (×2): 3 [IU] via SUBCUTANEOUS
  Administered 2020-03-10: 5 [IU] via SUBCUTANEOUS
  Administered 2020-03-10: 3 [IU] via SUBCUTANEOUS
  Administered 2020-03-10: 2 [IU] via SUBCUTANEOUS
  Administered 2020-03-11 (×2): 3 [IU] via SUBCUTANEOUS
  Administered 2020-03-11: 2 [IU] via SUBCUTANEOUS
  Administered 2020-03-12: 3 [IU] via SUBCUTANEOUS

## 2020-03-08 MED ORDER — TICAGRELOR 90 MG PO TABS
90.0000 mg | ORAL_TABLET | Freq: Two times a day (BID) | ORAL | Status: DC
  Administered 2020-03-09 – 2020-03-12 (×6): 90 mg via ORAL
  Filled 2020-03-08 (×7): qty 1

## 2020-03-08 MED ORDER — MORPHINE SULFATE (PF) 4 MG/ML IV SOLN
4.0000 mg | INTRAVENOUS | Status: DC | PRN
  Administered 2020-03-08 – 2020-03-09 (×3): 4 mg via INTRAVENOUS
  Filled 2020-03-08 (×3): qty 1

## 2020-03-08 MED ORDER — SODIUM CHLORIDE 0.9% FLUSH: 3.0000 mL | Freq: Once | INTRAVENOUS | Status: DC

## 2020-03-08 MED ORDER — INSULIN GLARGINE 100 UNIT/ML ~~LOC~~ SOLN
50.0000 [IU] | Freq: Every day | SUBCUTANEOUS | Status: DC
  Administered 2020-03-09 – 2020-03-11 (×4): 50 [IU] via SUBCUTANEOUS
  Filled 2020-03-08 (×5): qty 0.5

## 2020-03-08 MED ORDER — ATORVASTATIN CALCIUM 40 MG PO TABS
80.0000 mg | ORAL_TABLET | Freq: Every evening | ORAL | Status: DC
  Administered 2020-03-11: 80 mg via ORAL
  Filled 2020-03-08 (×3): qty 2

## 2020-03-08 MED ORDER — INSULIN ASPART 100 UNIT/ML ~~LOC~~ SOLN
5.0000 [IU] | Freq: Once | SUBCUTANEOUS | Status: AC
  Administered 2020-03-08: 5 [IU] via SUBCUTANEOUS
  Filled 2020-03-08: qty 1

## 2020-03-08 MED ORDER — ISOSORBIDE MONONITRATE ER 60 MG PO TB24
30.0000 mg | ORAL_TABLET | Freq: Every day | ORAL | Status: DC
  Administered 2020-03-09 – 2020-03-12 (×3): 30 mg via ORAL
  Filled 2020-03-08 (×4): qty 1

## 2020-03-08 MED ORDER — PROMETHAZINE HCL 25 MG/ML IJ SOLN
12.5000 mg | Freq: Four times a day (QID) | INTRAMUSCULAR | Status: DC | PRN
  Administered 2020-03-08 – 2020-03-11 (×9): 12.5 mg via INTRAVENOUS
  Filled 2020-03-08 (×9): qty 1

## 2020-03-08 MED ORDER — ACETAMINOPHEN 325 MG PO TABS: 650.0000 mg | ORAL_TABLET | ORAL | Status: DC | PRN

## 2020-03-08 MED ORDER — NITROGLYCERIN 0.4 MG SL SUBL: 0.4000 mg | SUBLINGUAL_TABLET | SUBLINGUAL | Status: DC | PRN

## 2020-03-08 MED ORDER — METFORMIN HCL 500 MG PO TABS
1000.0000 mg | ORAL_TABLET | Freq: Two times a day (BID) | ORAL | Status: DC
  Administered 2020-03-09 – 2020-03-12 (×4): 1000 mg via ORAL
  Filled 2020-03-08 (×6): qty 2

## 2020-03-08 MED ORDER — ENOXAPARIN SODIUM 40 MG/0.4ML ~~LOC~~ SOLN
40.0000 mg | SUBCUTANEOUS | Status: DC
  Administered 2020-03-09 – 2020-03-11 (×4): 40 mg via SUBCUTANEOUS
  Filled 2020-03-08 (×4): qty 0.4

## 2020-03-08 MED ORDER — ASPIRIN 81 MG PO TBEC
81.0000 mg | DELAYED_RELEASE_TABLET | Freq: Every day | ORAL | Status: DC
  Administered 2020-03-09 – 2020-03-12 (×3): 81 mg via ORAL
  Filled 2020-03-08 (×9): qty 1

## 2020-03-08 MED ORDER — POTASSIUM CHLORIDE ER 10 MEQ PO TBCR
10.0000 meq | EXTENDED_RELEASE_TABLET | Freq: Two times a day (BID) | ORAL | Status: DC
  Administered 2020-03-09 – 2020-03-12 (×6): 10 meq via ORAL
  Filled 2020-03-08 (×17): qty 1

## 2020-03-08 MED ORDER — CARVEDILOL 3.125 MG PO TABS
9.3750 mg | ORAL_TABLET | Freq: Two times a day (BID) | ORAL | Status: DC
  Administered 2020-03-09 – 2020-03-12 (×6): 9.375 mg via ORAL
  Filled 2020-03-08 (×7): qty 3

## 2020-03-08 NOTE — ED Provider Notes (Signed)
[facility] Provider Note   CSN: [id number] Arrival date & time: 03/08/20  1350     History Chief Complaint  Patient presents with  . Chest Pain    Cletis A Bonadio is a 39 y.o. female.  Aryonna A Mise is a 39 y.o. female with a history of hypertension, hyperlipidemia, MI, CAD with PCI to the mid LAD, CHF, diabetes, gastroparesis, who presents to the emergency department for evaluation of chest pain.  Patient reports that around 7:00 this morning she woke up feeling nauseated, she took a Zofran and tried to go back to sleep.  Woke up again around 10 AM with worsening nausea associated with central crushing chest pain.  She describes this as feeling like someone was sitting on her chest.  It starts in the center and radiates outward.  Pain does not radiate to the arm neck or back.  She reports some associated shortness of breath with it that is worse with exertion.  No associated abdominal pain but she is continued to be nauseated today.  She tried taking nitro twice this morning, the first dose she vomited up, and the second dose provided some brief relief of chest pain, but then it returned and has been persistent. She states this feels similar to her previous MI in 2019, she had a stent placed in the LAD, she has not had any catheter stress testing since this, but has had an echo that shows ischemic cardiomyopathy.  She is followed by Dr. Harrington Challenger with cardiology.  She denies any associated abdominal pain, this does not feel similar to her previous episodes of gastroparesis.  No associated fever or cough.  Shortness of breath is not pleuritic and she has no previous history of PE.  She is on [name] and aspirin, took an 81 mg aspirin today, but is not on any other blood thinners.  No other aggravating or alleviating factors.        Past Medical History:  Diagnosis Date  . Acid reflux   . Anxiety   . Arthritis   . 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 CAD (coronary artery disease) 11/13/2018   Late presentation anterior MI 12/19 >> LHC - dLM 25, mLAD 99, oOM2 100 (R-L collats), irreg RCA, EF 25-35 >> PCI: POBA to mLAD  . Chronic systolic CHF (congestive heart failure) ([place]) 11/28/2018   Ischemic CM // late presentation ant MI 10/2018 tx with POBA to LAD (residual CAD with CTO of the OM2) // Echo 12/19:  No mural apical thrombus, septal, apical mid ant and inf HK; mild LVH, EF 30-35, mild MR // Echo 01/2019: EF 30-35, Gr 1 DD, diff HK, apical AK, mild MR   . CKD (chronic kidney disease), stage I   . Diabetes mellitus type 1 ([place])   . Dyspnea   . Former tobacco use   . Gastroparesis   . Hypercholesteremia   . Hypertension   . Myocardial infarction ([place]) 2019  . Osteomyelitis ([place])    a. s/p R forefoot amputation.  . Renal failure   . Sciatica     Patient Active Problem List   Diagnosis Date Noted  . Chest pain 05/03/2019  . Chronic systolic CHF (congestive heart failure) ([place]) 11/28/2018  . S/P PTCA (percutaneous transluminal coronary angioplasty)   . CAD (coronary artery disease) 11/13/2018  . 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[place])  11/13/2018  . 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 Hx of  Late Presentation of Anterior MI tx with POBA to LAD 11/11/2018  . Osteomyelitis ([place]) 07/01/2018  . Personal history of noncompliance with medical treatment, presenting hazards to health 10/28/2015  . Multiple nevi 10/07/2012  . Toe ulcer ([place]) 03/11/2012  . Essential hypertension 03/11/2012  . 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 Diabetic neuropathy ([place]) 02/11/2012  . Back pain 02/11/2012  . Muscle spasm 02/11/2012  . Hyperlipidemia associated with type 2 diabetes mellitus ([place]) 02/11/2012  . Obesity 02/11/2012  . Cellulitis in diabetic foot ([place]) 10/31/2011    Past Surgical History:  Procedure Laterality Date  . AMPUTATION Right 11/03/2011   Procedure: AMPUTATION RAY;  Surgeon: Jamesetta So;  Location: [facility];  Service: General;  Laterality: Right;  Right fourth and fifth metatarsal   . APPENDECTOMY    . CARDIAC CATHETERIZATION  10/2018  . CESAREAN SECTION  2004 and 2007   x2  . CORONARY/GRAFT ACUTE MI REVASCULARIZATION N/A 11/11/2018   Procedure: CORONARY/GRAFT ACUTE MI REVASCULARIZATION;  Surgeon: Jettie Booze, MD;  Location: [facility];  Service: Cardiovascular;  Laterality: N/A;  . FRACTURE SURGERY  2000  . INJECTION OF SILICONE OIL Right [id number]   Procedure: Injection Of Silicone Oil;  Surgeon: Bernarda Caffey, MD;  Location: [facility];  Service: Ophthalmology;  Laterality: Right;  . LEFT HEART CATH AND CORONARY ANGIOGRAPHY N/A 11/11/2018   Procedure: LEFT HEART CATH AND CORONARY ANGIOGRAPHY;  Surgeon: Jettie Booze, MD;  Location: [facility];  Service: Cardiovascular;  Laterality: N/A;  . MEMBRANE PEEL Right 09/14/2019   Procedure: Antoine Primas;  Surgeon: Bernarda Caffey, MD;  Location: [facility];  Service: Ophthalmology;  Laterality: Right;  . PARS PLANA VITRECTOMY Right 09/14/2019   Procedure: [place] Vitrectomy With 25 Gauge;  Surgeon: Bernarda Caffey, MD;  Location: [facility];  Service: Ophthalmology;  Laterality: Right;  . PHOTOCOAGULATION WITH LASER Right 09/14/2019   Procedure: Photocoagulation With Laser;  Surgeon: Bernarda Caffey, MD;  Location: [facility];  Service: Ophthalmology;  Laterality: Right;  . 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 TUBAL LIGATION       OB History   No obstetric history on file.     Family History  Problem Relation Age of Onset  . Diabetes Father   . Lung cancer Father   . Alcoholism Father   . Asthma Mother   . Kidney disease Mother   . Anemia Mother        hemolytic  . Hypertension Mother   . Heart attack Paternal Grandmother    . Diabetes Paternal Grandmother   . Diabetes Paternal Grandfather   . Anesthesia problems Neg Hx   . Hypotension Neg Hx   . Malignant hyperthermia Neg Hx   . Pseudochol deficiency Neg Hx     Social History   Tobacco Use  . Smoking status: Former Smoker    Packs/day: 0.25    Years: 20.00    Pack years: 5.00    Types: Cigarettes    Quit date: 12/29/2013    Years since quitting: 6.1  . Smokeless tobacco: Never Used  Substance Use Topics  . Alcohol use: No  . Drug use: No    Home Medications Prior to Admission medications   Medication Sig Start Date End Date Taking? Authorizing Provider  albuterol (VENTOLIN  HFA) 108 (90 Base) MCG/ACT inhaler Inhale 2 puffs into the lungs every 6 (six) hours as needed for wheezing or shortness of breath.   Yes [provider]  aspirin (GNP ASPIRIN LOW DOSE) 81 MG EC tablet Take 1 tablet (81 mg total) by mouth daily with breakfast. Swallow whole. 05/04/19  Yes Roxan Hockey, MD  atorvastatin (LIPITOR) 80 MG tablet Take 1 tablet (80 mg total) by mouth every evening. 05/04/19  Yes Emokpae, Courage, MD  BAQSIMI TWO PACK 3 MG/DOSE POWD Place 3 mg into the nose once as needed (for emergency low blood sugar levels).  01/24/20  Yes [provider]  brimonidine (ALPHAGAN) 0.2 % ophthalmic solution Place 1 drop into the right eye 3 (three) times daily. 01/23/20  Yes Bernarda Caffey, MD  BYDUREON BCISE 2 MG/0.85ML AUIJ Inject 2 mg into the skin every Thursday. 12/26/19  Yes [provider]  carvedilol (COREG) 6.25 MG tablet Take 1.5 tablets (9.375 mg total) by mouth 2 (two) times daily. 05/04/19  Yes Emokpae, Courage, MD  dorzolamide-timolol (COSOPT) 22.3-6.8 MG/ML ophthalmic solution Place 1 drop into the right eye 3 (three) times daily. 01/23/20  Yes Bernarda Caffey, MD  fenofibrate (TRICOR) 145 MG tablet Take 1 tablet (145 mg total) by mouth daily. 12/26/19  Yes Daune Perch, NP  furosemide (LASIX) 40 MG tablet Take 1 tablet (40 mg total) by  mouth 2 (two) times daily. 05/04/19 03/08/20 Yes Emokpae, Courage, MD  isosorbide mononitrate (IMDUR) 30 MG 24 hr tablet Take 1 tablet (30 mg total) by mouth daily. 09/01/19  Yes Fay Records, MD  LANTUS SOLOSTAR 100 UNIT/ML Solostar Pen INNJECT 50 UNITS S.Q. ONCE DAILY AT 10 P.M. Patient taking differently: Inject 50 Units into the skin at bedtime.  02/03/16  Yes Nida, Marella Chimes, MD  losartan (COZAAR) 25 MG tablet Take 1 tablet (25 mg total) by mouth every evening. 05/04/19  Yes Emokpae, Courage, MD  metFORMIN (GLUCOPHAGE) 1000 MG tablet Take 1,000 mg by mouth 2 (two) times daily with a meal.   Yes [provider]  metoCLOPramide (REGLAN) 5 MG tablet Take 5 mg by mouth 2 (two) times daily.   Yes [provider]  nitroGLYCERIN (NITROSTAT) 0.4 MG SL tablet Place 1 tablet (0.4 mg total) under the tongue every 5 (five) minutes as needed. 05/04/19  Yes Emokpae, Courage, MD  NOVOLOG FLEXPEN 100 UNIT/ML FlexPen INJECT 12-18 UNITS S.Q. THREE TIMES DAILY WITH MEALS. Patient taking differently: Inject 12-18 Units into the skin 3 (three) times daily with meals.  04/29/16  Yes Cassandria Anger, MD  ondansetron (ZOFRAN ODT) 4 MG disintegrating tablet Take 1 tablet (4 mg total) by mouth every 8 (eight) hours as needed for nausea or vomiting. 05/04/19  Yes Emokpae, Courage, MD  potassium chloride (K-DUR) 10 MEQ tablet Take 1 tablet (10 mEq total) by mouth 2 (two) times daily. 05/04/19 03/08/20 Yes Emokpae, Courage, MD  promethazine (PHENERGAN) 12.5 MG suppository Place 12.5 mg rectally as needed for nausea or vomiting.   Yes [provider]  sertraline (ZOLOFT) 100 MG tablet Take 200 mg by mouth at bedtime.    Yes [provider]  spironolactone (ALDACTONE) 25 MG tablet Take 0.5 tablets (12.5 mg total) by mouth daily. 05/04/19  Yes Roxan Hockey, MD  ticagrelor ([name]) 90 MG TABS tablet Take 1 tablet (90 mg total) by mouth 2 (two) times daily. 05/04/19  Yes Roxan Hockey,  MD    Allergies    Canagliflozin and Nsaids  Review of  Systems   Review of Systems  Constitutional: Negative for chills, diaphoresis and fever.  HENT: Negative.   Respiratory: Positive for shortness of breath. Negative for cough and wheezing.   Cardiovascular: Positive for chest pain. Negative for palpitations and leg swelling.  Gastrointestinal: Positive for nausea and vomiting. Negative for abdominal pain, constipation and diarrhea.  Genitourinary: Negative for dysuria and frequency.  Musculoskeletal: Negative for arthralgias and myalgias.  Skin: Negative for color change and rash.  Neurological: Positive for light-headedness. Negative for dizziness and syncope.    Physical Exam Updated Vital Signs BP (!) 131/91 (BP Location: Left Arm)   Pulse 91   Temp 98.1 F (36.7 C) (Oral)   Resp 18   Ht 5\' 10"  (1.778 m)   Wt 89.8 kg   LMP 02/23/2020   SpO2 98%   BMI 28.41 kg/m   Physical Exam Vitals and nursing note reviewed.  Constitutional:      General: She is not in acute distress.    Appearance: She is well-developed. She is not diaphoretic.     Comments: Appears uncomfortable but is in no acute distress  HENT:     Head: Normocephalic and atraumatic.  Eyes:     General:        Right eye: No discharge.        Left eye: No discharge.     Pupils: Pupils are equal, round, and reactive to light.  Cardiovascular:     Rate and Rhythm: Normal rate and regular rhythm.     Pulses:          Radial pulses are 2+ on the right side and 2+ on the left side.       Dorsalis pedis pulses are 2+ on the right side and 2+ on the left side.     Heart sounds: Normal heart sounds. No murmur. No friction rub. No gallop.   Pulmonary:     Effort: Pulmonary effort is normal. No respiratory distress.     Breath sounds: Normal breath sounds. No wheezing or rales.     Comments: Respirations equal and unlabored, patient able to speak in full sentences, lungs clear to auscultation  bilaterally Chest:     Chest wall: No tenderness.     Comments: Pain is not reproducible with palpation Abdominal:     General: Bowel sounds are normal. There is no distension.     Palpations: Abdomen is soft. There is no mass.     Tenderness: There is no abdominal tenderness. There is no guarding.  Musculoskeletal:        General: No deformity.     Cervical back: Neck supple.     Right lower leg: No edema.     Left lower leg: No edema.  Skin:    General: Skin is warm and dry.     Capillary Refill: Capillary refill takes less than 2 seconds.  Neurological:     Mental Status: She is alert.     Coordination: Coordination normal.     Comments: Speech is clear, able to follow commands Moves extremities without ataxia, coordination intact  Psychiatric:        Mood and Affect: Mood normal.        Behavior: Behavior normal.     ED Results / Procedures / Treatments   Labs (all labs ordered are listed, but only abnormal results are displayed) Labs Reviewed  BASIC METABOLIC PANEL - Abnormal; Notable for the following components:      Result Value  Sodium 131 (*)    Chloride 97 (*)    Glucose, Bld 315 (*)    Creatinine, Ser 1.27 (*)    Calcium 8.8 (*)    GFR calc non Af Amer 53 (*)    All other components within normal limits  CBC - Abnormal; Notable for the following components:   WBC 10.9 (*)    All other components within normal limits  BRAIN NATRIURETIC PEPTIDE  PREGNANCY, URINE  POC URINE PREG, ED  TROPONIN I (HIGH SENSITIVITY)  TROPONIN I (HIGH SENSITIVITY)    EKG EKG Interpretation  Date/Time:  Friday March 08 2020 13:59:08 EDT Ventricular Rate:  90 PR Interval:    QRS Duration: 88 QT Interval:  346 QTC Calculation: 424 R Axis:   10 Text Interpretation: Sinus rhythm Low voltage, extremity leads Confirmed by Gerlene Fee [phone]) on 03/08/2020 2:18:43 PM   Radiology DG Chest 2 View  Result Date: 03/08/2020 CLINICAL DATA:  Awoke with chest pain and nausea.  Episode of vomiting. EXAM: CHEST - 2 VIEW COMPARISON:  Rib radiographs 07/17/2019, chest radiograph 05/03/2019 FINDINGS: Heart is normal in size.The cardiomediastinal contours are normal. The lungs are clear. Pulmonary vasculature is normal. No consolidation, pleural effusion, or pneumothorax. No acute osseous abnormalities are seen. Mild degenerative change in the thoracic spine. IMPRESSION: No acute chest finding. Electronically Signed   By: Keith Rake M.D.   On: 03/08/2020 15:15    Procedures Procedures (including critical care time)  Medications Ordered in ED Medications  sodium chloride flush (NS) 0.9 % injection 3 mL (has no administration in time range)  morphine 4 MG/ML injection 4 mg (4 mg Intravenous Given 03/08/20 1516)  ondansetron (ZOFRAN) injection 4 mg (4 mg Intravenous Given 03/08/20 1515)  aspirin tablet 325 mg (325 mg Oral Given 03/08/20 1514)    ED Course  I have reviewed the triage vital signs and the nursing notes.  Pertinent labs & imaging results that were available during my care of the patient were reviewed by me and considered in my medical decision making (see chart for details).    MDM Rules/Calculators/A&P                      Patient presents to the emergency department with chest pain. Patient nontoxic appearing, appears uncomfortable but in no apparent distress, vitals without significant abnormality. Fairly benign physical exam.  Noland Fordyce is very much concerning for potential ACS and feels like her previous MI.  Given inferior lead changes would like to avoid additional doses of nitroglycerin to prevent hypotension, but will give aspirin, morphine and Zofran.  Prior heart catheterization reviewed: 2019, had stent placed to the mid LAD also had some disease in the left circumflex Patient's primary cardiologist: Dr. Harrington Challenger  DDX: ACS, pulmonary embolism, dissection, pneumothorax, pneumonia, arrhythmia, severe anemia, MSK, GERD, anxiety. Evaluation initiated  with labs, EKG, and CXR. Patient on cardiac monitor.   Labs and imaging ordered, reviewed and interpreted by myself CBC: Minimal leukocytosis, stable hemoglobin  BMP: Glucose of 315 with sodium of 131, no other significant electrolyte derangements, creatinine of 1.27, which is minimally increased from baseline. Troponin: Negative x2 EKG: With some nonspecific ST changes in the inferior leads which appears new from previous EKG CXR: Negative, without infiltrate, effusion, pneumothorax, or fracture/dislocation.   [place] Pathway Score 5 and EKG with some nonspecific ST changes in the inferior leads.  Despite reassuring laboratory work-up patient is high risk.  Pain is improved with morphine but story is  concerning and feels similar to previous MI.  Case discussed with Dr. Domenic Polite with cardiology who recommends overnight observation with continued trending of enzymes, repeat EKG in the morning.  If patient is feeling improved and enzymes and EKG remained stable feels that patient would be appropriate for discharge with outpatient stress testing, but if patient has worsening of pain with any enzyme or EKG changes patient may need more emergent stress testing and cardiac evaluation.  Dr. Domenic Polite was comfortable with the patient remaining here [facility] for admission.  I discussed plan for chest pain observation admission with the patient and she expresses understanding and agreement.  Case was discussed with Dr. Olevia Bowens with [facility] hospitalist who will see and admit the patient.  Final Clinical Impression(s) / ED Diagnoses Final diagnoses:  Central chest pain    Rx / DC Orders ED Discharge Orders    None       Janet Berlin 03/08/20 2048    Maudie Flakes, MD 03/09/20 1233

## 2020-03-08 NOTE — H&P (Signed)
History and Physical    ARMANDA VIBERT [id number] DOB: 10-31-1981 DOA: 03/08/2020  PCP: Alfonse Flavors, MD   Patient coming from: Home.  I have personally briefly reviewed patient's old medical records in [place]  Chief Complaint: Chest tightness.  HPI: Anne [name] is a 39 y.o. female with medical history significant of GERD, anxiety, osteoarthritis, stage II CKD, history of acute renal failure, type 2 diabetes, gastroparesis, hyperlipidemia, hypertension, osteomyelitis, partial right foot amputation, CAD, late presentation anterior MI in December, 2019 with acute MI revascularization, Dressler syndrome who is coming to the emergency department with complaints of chest tightness since the morning.  The patient states that she woke up nauseous in the morning.  She took some Zofran and stay in bed.  Later in the morning, she had nausea again with precordial chest pain radiating outwards associated with palpitations, diaphoresis and dizziness.  After this, she took more  Zofran and sublingual NTG without significant results, likely because she took her all other medications and developed 2 episodes of emesis after this.  She stayed reclining or in bed thinking that he would get better, but subsequently came to the ED early in the afternoon.  She has some headaches from nitroglycerin.  She denies orthopnea, PND or recent pitting edema of the lower extremities.  She denies fever, chills, rhinorrhea, but states she has some sore throat following vomiting.  No current dyspnea, has some mild transient cough after vomiting, but has not been vomiting before or since then.  She denies wheezing or hemoptysis.  No abdominal pain, diarrhea, constipation, melena or hematochezia.  No dysuria, frequency or hematuria.  No polyuria, polydipsia, polyphagia, states she has some blurred vision, which she attributes to her eyes condition along with nausea and emesis earlier.  ED Course: Initial  vital signs temperature 98.1 F, pulse 90, respiration 13, blood pressure 131/91 mmHg and O2 sat 97% on room air.  The patient received morphine 4 mg IVP, Zofran 4 mg IVP insulin nitroglycerin in the emergency department.  Dr. Domenic Polite was contacted who recommended overnight monitoring with a repeat ECG in a.m.  If she is chest pain-free without ECG changes she may go home.  He also suggested for the patient to follow-up to set up a stress test as an outpatient.  EKG was sinus rhythm with low voltage.  Troponin x2 was 4 ng/L.  BNP 47.0 pg/mL.  CBC shows a white count of 10.9, hemoglobin 12.7 g/dL and platelets 278.  Sodium 131, potassium 4.2, chloride 97 and CO2 23 mmol/L.  Sodium 131, potassium 4.2, chloride 97 CO2 23 mmol/L.  Glucose 314, BUN 20, creatinine 1.27 and calcium 8.8 mg/dL.  Her chest radiograph did not have any acute finding.  Review of Systems: As per HPI otherwise all other systems reviewed and are negative.  Past Medical History:  Diagnosis Date  . Acid reflux   . Anxiety   . Arthritis   . 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 CAD (coronary artery disease) 11/13/2018   Late presentation anterior MI 12/19 >> LHC - dLM 25, mLAD 99, oOM2 100 (R-L collats), irreg RCA, EF 25-35 >> PCI: POBA to mLAD  . Chronic systolic CHF (congestive heart failure) ([place]) 11/28/2018   Ischemic CM // late presentation ant MI 10/2018 tx with POBA to LAD (residual CAD with CTO of the OM2) // Echo 12/19:  No mural apical thrombus, septal, apical mid ant and inf  HK; mild LVH, EF 30-35, mild MR // Echo 01/2019: EF 30-35, Gr 1 DD, diff HK, apical AK, mild MR   . CKD (chronic kidney disease), stage I   . Diabetes mellitus type 1 ([place])   . Dyspnea   . Former tobacco use   . Gastroparesis   . Hypercholesteremia   . Hypertension   . Myocardial infarction ([place]) 2019  . Osteomyelitis ([place])    a. s/p R forefoot amputation.  . Renal failure   . Sciatica     Past Surgical  History:  Procedure Laterality Date  . AMPUTATION Right 11/03/2011   Procedure: AMPUTATION RAY;  Surgeon: Jamesetta So;  Location: [facility];  Service: General;  Laterality: Right;  Right fourth and fifth metatarsal   . APPENDECTOMY    . CARDIAC CATHETERIZATION  10/2018  . CESAREAN SECTION  2004 and 2007   x2  . CORONARY/GRAFT ACUTE MI REVASCULARIZATION N/A 11/11/2018   Procedure: CORONARY/GRAFT ACUTE MI REVASCULARIZATION;  Surgeon: Jettie Booze, MD;  Location: [place] [facility];  Service: Cardiovascular;  Laterality: N/A;  . FRACTURE SURGERY  2000  . INJECTION OF SILICONE OIL Right [id number]   Procedure: Injection Of Silicone Oil;  Surgeon: Bernarda Caffey, MD;  Location: [place];  Service: Ophthalmology;  Laterality: Right;  . LEFT HEART CATH AND CORONARY ANGIOGRAPHY N/A 11/11/2018   Procedure: LEFT HEART CATH AND CORONARY ANGIOGRAPHY;  Surgeon: Jettie Booze, MD;  Location: [place] [facility];  Service: Cardiovascular;  Laterality: N/A;  . MEMBRANE PEEL Right 09/14/2019   Procedure: Antoine Primas;  Surgeon: Bernarda Caffey, MD;  Location: [place];  Service: Ophthalmology;  Laterality: Right;  . PARS PLANA VITRECTOMY Right 09/14/2019   Procedure: [place] Vitrectomy With 25 Gauge;  Surgeon: Bernarda Caffey, MD;  Location: [facility];  Service: Ophthalmology;  Laterality: Right;  . PHOTOCOAGULATION WITH LASER Right 09/14/2019   Procedure: Photocoagulation With Laser;  Surgeon: Bernarda Caffey, MD;  Location: [place];  Service: Ophthalmology;  Laterality: Right;  . 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   Procedure: REPAIR OF COMPLEX TRACTION RETINAL DETACHMENT;  Surgeon: Bernarda Caffey, MD;  Location: [place];  Service: Ophthalmology;  Laterality: Right;  . TUBAL LIGATION      Social History  reports that she quit smoking about 6 years ago. Her smoking use included cigarettes. She has a 5.00 pack-year smoking history. She has never used smokeless tobacco. She reports  that she does not drink alcohol or use drugs.  Allergies  Allergen Reactions  . Canagliflozin Other (See Comments)    Vaginal yeast infections  . Nsaids Other (See Comments)    Yeast infection     Family History  Problem Relation Age of Onset  . Diabetes Father   . Lung cancer Father   . Alcoholism Father   . Asthma Mother   . Kidney disease Mother   . Anemia Mother        hemolytic  . Hypertension Mother   . Heart attack Paternal Grandmother   . Diabetes Paternal Grandmother   . Diabetes Paternal Grandfather   . Anesthesia problems Neg Hx   . Hypotension Neg Hx   . Malignant hyperthermia Neg Hx   . Pseudochol deficiency Neg Hx    Prior to Admission medications   Medication Sig Start Date End Date Taking? Authorizing Provider  albuterol (VENTOLIN HFA) 108 (90 Base) MCG/ACT inhaler Inhale 2 puffs into the lungs every 6 (six) hours as needed for wheezing or  shortness of breath.   Yes [provider]  aspirin (GNP ASPIRIN LOW DOSE) 81 MG EC tablet Take 1 tablet (81 mg total) by mouth daily with breakfast. Swallow whole. 05/04/19  Yes Roxan Hockey, MD  atorvastatin (LIPITOR) 80 MG tablet Take 1 tablet (80 mg total) by mouth every evening. 05/04/19  Yes Emokpae, Courage, MD  BAQSIMI TWO PACK 3 MG/DOSE POWD Place 3 mg into the nose once as needed (for emergency low blood sugar levels).  01/24/20  Yes [provider]  brimonidine (ALPHAGAN) 0.2 % ophthalmic solution Place 1 drop into the right eye 3 (three) times daily. 01/23/20  Yes Bernarda Caffey, MD  BYDUREON BCISE 2 MG/0.85ML AUIJ Inject 2 mg into the skin every Thursday. 12/26/19  Yes [provider]  carvedilol (COREG) 6.25 MG tablet Take 1.5 tablets (9.375 mg total) by mouth 2 (two) times daily. 05/04/19  Yes Emokpae, Courage, MD  dorzolamide-timolol (COSOPT) 22.3-6.8 MG/ML ophthalmic solution Place 1 drop into the right eye 3 (three) times daily. 01/23/20  Yes Bernarda Caffey, MD  fenofibrate (TRICOR) 145 MG  tablet Take 1 tablet (145 mg total) by mouth daily. 12/26/19  Yes Daune Perch, NP  furosemide (LASIX) 40 MG tablet Take 1 tablet (40 mg total) by mouth 2 (two) times daily. 05/04/19 03/08/20 Yes Emokpae, Courage, MD  isosorbide mononitrate (IMDUR) 30 MG 24 hr tablet Take 1 tablet (30 mg total) by mouth daily. 09/01/19  Yes Fay Records, MD  LANTUS SOLOSTAR 100 UNIT/ML Solostar Pen INNJECT 50 UNITS S.Q. ONCE DAILY AT 10 P.M. Patient taking differently: Inject 50 Units into the skin at bedtime.  02/03/16  Yes Nida, Marella Chimes, MD  losartan (COZAAR) 25 MG tablet Take 1 tablet (25 mg total) by mouth every evening. 05/04/19  Yes Emokpae, Courage, MD  metFORMIN (GLUCOPHAGE) 1000 MG tablet Take 1,000 mg by mouth 2 (two) times daily with a meal.   Yes [provider]  metoCLOPramide (REGLAN) 5 MG tablet Take 5 mg by mouth 2 (two) times daily.   Yes [provider]  nitroGLYCERIN (NITROSTAT) 0.4 MG SL tablet Place 1 tablet (0.4 mg total) under the tongue every 5 (five) minutes as needed. 05/04/19  Yes Emokpae, Courage, MD  NOVOLOG FLEXPEN 100 UNIT/ML FlexPen INJECT 12-18 UNITS S.Q. THREE TIMES DAILY WITH MEALS. Patient taking differently: Inject 12-18 Units into the skin 3 (three) times daily with meals.  04/29/16  Yes Cassandria Anger, MD  ondansetron (ZOFRAN ODT) 4 MG disintegrating tablet Take 1 tablet (4 mg total) by mouth every 8 (eight) hours as needed for nausea or vomiting. 05/04/19  Yes Emokpae, Courage, MD  potassium chloride (K-DUR) 10 MEQ tablet Take 1 tablet (10 mEq total) by mouth 2 (two) times daily. 05/04/19 03/08/20 Yes Emokpae, Courage, MD  promethazine (PHENERGAN) 12.5 MG suppository Place 12.5 mg rectally as needed for nausea or vomiting.   Yes [provider]  sertraline (ZOLOFT) 100 MG tablet Take 200 mg by mouth at bedtime.    Yes [provider]  spironolactone (ALDACTONE) 25 MG tablet Take 0.5 tablets (12.5 mg total) by mouth daily. 05/04/19  Yes  Roxan Hockey, MD  ticagrelor ([name]) 90 MG TABS tablet Take 1 tablet (90 mg total) by mouth 2 (two) times daily. 05/04/19  Yes Roxan Hockey, MD    Physical Exam: Vitals:   03/08/20 1630 03/08/20 1730 03/08/20 1800 03/08/20 1830  BP: 127/90 132/87 127/83 124/85  Pulse: 91     Resp: (!) 23 (!) 21 (!)  22 (!) 21  Temp:    98 F (36.7 C)  TempSrc:    Oral  SpO2: 95%   94%  Weight:      Height:        Constitutional: NAD, calm, comfortable Eyes: PERRL, lids and conjunctivae normal ENMT: Mucous membranes are moist. Posterior pharynx clear of any exudate or lesions. Neck: normal, supple, no masses, no thyromegaly Respiratory: clear to auscultation bilaterally, no wheezing, no crackles. Normal respiratory effort. No accessory muscle use.  Cardiovascular: Regular rate and rhythm, no murmurs / rubs / gallops. No extremity edema. 2+ pedal pulses. No carotid bruits.  Abdomen: Nondistended.  BS positive.  Soft, no tenderness, no masses palpated. No hepatosplenomegaly. Musculoskeletal: no clubbing / cyanosis.  Good ROM, no contractures. Normal muscle tone.  Skin: no clinical significance rashes, lesions, ulcers on limited skin examination. Neurologic: CN 2-12 grossly intact. Sensation intact, DTR normal. Strength 5/5 in all 4.  Psychiatric: Normal judgment and insight. Alert and oriented x 3. Normal mood.   Labs on Admission: I have personally reviewed following labs and imaging studies  CBC: Recent Labs  Lab 03/08/20 1400  WBC 10.9*  HGB 12.7  HCT 36.9  MCV 88.3  PLT 0000000    Basic Metabolic Panel: Recent Labs  Lab 03/08/20 1400  NA 131*  K 4.2  CL 97*  CO2 23  GLUCOSE 315*  BUN 20  CREATININE 1.27*  CALCIUM 8.8*    GFR: Estimated Creatinine Clearance: 72.3 mL/min (A) (by C-G formula based on SCr of 1.27 mg/dL (H)).  Liver Function Tests: No results for input(s): AST, ALT, ALKPHOS, BILITOT, PROT, ALBUMIN in the last 168 hours.  Urine analysis:    Component  Value Date/Time   COLORURINE AMBER (A) 01/09/2019 0220   APPEARANCEUR CLEAR 01/09/2019 0220   LABSPEC 1.010 01/09/2019 0220   PHURINE 5.0 01/09/2019 0220   GLUCOSEU NEGATIVE 01/09/2019 0220   HGBUR LARGE (A) 01/09/2019 0220   BILIRUBINUR NEGATIVE 01/09/2019 0220   BILIRUBINUR neg 06/03/2012 1536   KETONESUR NEGATIVE 01/09/2019 0220   PROTEINUR NEGATIVE 01/09/2019 0220   UROBILINOGEN 0.2 06/26/2013 1354   NITRITE NEGATIVE 01/09/2019 0220   LEUKOCYTESUR SMALL (A) 01/09/2019 0220    Radiological Exams on Admission: DG Chest 2 View  Result Date: 03/08/2020 CLINICAL DATA:  Awoke with chest pain and nausea. Episode of vomiting. EXAM: CHEST - 2 VIEW COMPARISON:  Rib radiographs 07/17/2019, chest radiograph 05/03/2019 FINDINGS: Heart is normal in size.The cardiomediastinal contours are normal. The lungs are clear. Pulmonary vasculature is normal. No consolidation, pleural effusion, or pneumothorax. No acute osseous abnormalities are seen. Mild degenerative change in the thoracic spine. IMPRESSION: No acute chest finding. Electronically Signed   By: Keith Rake M.D.   On: 03/08/2020 15:15    EKG: Independently reviewed.  Vent. rate 90 BPM PR interval * ms QRS duration 88 ms QT/QTc 346/424 ms P-R-T axes 77 10 21 Sinus rhythm Low voltage, extremity leads  Assessment/Plan Principal Problem:   ACS (acute coronary syndrome) (HCC)    CAD (coronary artery disease) Observation/telemetry. Supplemental oxygen as needed. Nitropaste to ACW. Continue carvedilol 9.375 mg p.o. twice daily. Continue atorvastatin 80 mg p.o. every evening. Continue ticagrelor and aspirin. Resume Imdur in the morning. Repeat ECG in a.m. Follow-up with cardiology in the near future. Will need stress test.  Active Problems:   Type 2 diabetes mellitus with vascular disease (HCC) Carbohydrate modified diet. Continue Lantus 15 units in the evening. CBG monitoring with RI SS. Check hemoglobin A1c.  Essential hypertension On carvedilol twice daily. Continue losartan 25 mg p.o. daily. Continue furosemide 40 mg p.o. daily.    Chronic systolic CHF (congestive heart failure) (HCC) No signs of volume overload. Supplemental oxygen as needed. Continue ARB, beta-blocker and diuretic.    Hypercholesteremia On atorvastatin.  DVT prophylaxis: Lovenox SQ. Code Status:   Full code. Family Communication: Disposition Plan:   Patient is from:  Home.  Anticipated DC to:  Home.  Anticipated DC date:  03/09/2020.  Anticipated DC barriers: Clinical improvement/no ECG changes. Consults called:  None. Admission status:  Observation/telemetry.  Severity of Illness: Moderate severity.  Reubin Milan MD [facility]  How to contact the [facility] Attending or Consulting provider [place] or covering provider during after hours [place], for this patient?   1. Check the care team in [facility] and look for a) attending/consulting [facility] provider listed and b) the [facility] team listed 2. Log into [email or web address] and use [place]'s universal password to access. If you do not have the password, please contact the hospital operator. 3. Locate the [facility] provider you are looking for under [facility] and page to a number that you can be directly reached. 4. If you still have difficulty reaching the provider, please page the [facility] (Director on Call) for the Hospitalists listed on amion for assistance.  03/08/2020, 7:58 PM   This document was prepared using Dragon voice recognition software and may contain some unintended transcription errors.

## 2020-03-08 NOTE — ED Triage Notes (Signed)
Pt reports woke up at 1030 with cp and nausea.  Reports took 1 nitro and her regular medications.  Reports vomited a few min later.  Reports took a second nitro approx 1 hour after the first and says chest pain eased some.  Pt says feels like has a ton of bricks on her chest.  Reports has chronic sob and cough since previous MI.

## 2020-03-08 NOTE — ED Notes (Signed)
Pt in bed, pt c/o nausea and pain, requests pain and nausea med, md notified, pt vomited small amount, pain and nausea med given.

## 2020-03-09 DIAGNOSIS — K219 Gastro-esophageal reflux disease without esophagitis: Secondary | ICD-10-CM | POA: Diagnosis present

## 2020-03-09 DIAGNOSIS — I130 Hypertensive heart and chronic kidney disease with heart failure and stage 1 through stage 4 chronic kidney disease, or unspecified chronic kidney disease: Secondary | ICD-10-CM | POA: Diagnosis present

## 2020-03-09 DIAGNOSIS — I5022 Chronic systolic (congestive) heart failure: Secondary | ICD-10-CM | POA: Diagnosis present

## 2020-03-09 DIAGNOSIS — K3184 Gastroparesis: Secondary | ICD-10-CM | POA: Diagnosis present

## 2020-03-09 DIAGNOSIS — E7800 Pure hypercholesterolemia, unspecified: Secondary | ICD-10-CM

## 2020-03-09 DIAGNOSIS — F419 Anxiety disorder, unspecified: Secondary | ICD-10-CM | POA: Diagnosis present

## 2020-03-09 DIAGNOSIS — I10 Essential (primary) hypertension: Secondary | ICD-10-CM

## 2020-03-09 DIAGNOSIS — Z87891 Personal history of nicotine dependence: Secondary | ICD-10-CM | POA: Diagnosis not present

## 2020-03-09 DIAGNOSIS — M1990 Unspecified osteoarthritis, unspecified site: Secondary | ICD-10-CM | POA: Diagnosis present

## 2020-03-09 DIAGNOSIS — E785 Hyperlipidemia, unspecified: Secondary | ICD-10-CM | POA: Diagnosis present

## 2020-03-09 DIAGNOSIS — Z20822 Contact with and (suspected) exposure to covid-19: Secondary | ICD-10-CM | POA: Diagnosis present

## 2020-03-09 DIAGNOSIS — E1043 Type 1 diabetes mellitus with diabetic autonomic (poly)neuropathy: Secondary | ICD-10-CM | POA: Diagnosis present

## 2020-03-09 DIAGNOSIS — M5430 Sciatica, unspecified side: Secondary | ICD-10-CM | POA: Diagnosis present

## 2020-03-09 DIAGNOSIS — I252 Old myocardial infarction: Secondary | ICD-10-CM | POA: Diagnosis not present

## 2020-03-09 DIAGNOSIS — Z888 Allergy status to other drugs, medicaments and biological substances status: Secondary | ICD-10-CM | POA: Diagnosis not present

## 2020-03-09 DIAGNOSIS — I2510 Atherosclerotic heart disease of native coronary artery without angina pectoris: Secondary | ICD-10-CM | POA: Diagnosis present

## 2020-03-09 DIAGNOSIS — I249 Acute ischemic heart disease, unspecified: Secondary | ICD-10-CM | POA: Diagnosis present

## 2020-03-09 DIAGNOSIS — K529 Noninfective gastroenteritis and colitis, unspecified: Secondary | ICD-10-CM | POA: Diagnosis present

## 2020-03-09 DIAGNOSIS — I25118 Atherosclerotic heart disease of native coronary artery with other forms of angina pectoris: Secondary | ICD-10-CM | POA: Diagnosis present

## 2020-03-09 DIAGNOSIS — R112 Nausea with vomiting, unspecified: Secondary | ICD-10-CM | POA: Diagnosis present

## 2020-03-09 DIAGNOSIS — Z89431 Acquired absence of right foot: Secondary | ICD-10-CM | POA: Diagnosis not present

## 2020-03-09 DIAGNOSIS — E1051 Type 1 diabetes mellitus with diabetic peripheral angiopathy without gangrene: Secondary | ICD-10-CM | POA: Diagnosis present

## 2020-03-09 DIAGNOSIS — E1159 Type 2 diabetes mellitus with other circulatory complications: Secondary | ICD-10-CM

## 2020-03-09 DIAGNOSIS — N182 Chronic kidney disease, stage 2 (mild): Secondary | ICD-10-CM | POA: Diagnosis present

## 2020-03-09 DIAGNOSIS — R079 Chest pain, unspecified: Secondary | ICD-10-CM

## 2020-03-09 DIAGNOSIS — Z833 Family history of diabetes mellitus: Secondary | ICD-10-CM | POA: Diagnosis not present

## 2020-03-09 DIAGNOSIS — I241 Dressler's syndrome: Secondary | ICD-10-CM | POA: Diagnosis present

## 2020-03-09 DIAGNOSIS — E1022 Type 1 diabetes mellitus with diabetic chronic kidney disease: Secondary | ICD-10-CM | POA: Diagnosis present

## 2020-03-09 LAB — GLUCOSE, CAPILLARY
Glucose-Capillary: 173 mg/dL — ABNORMAL HIGH (ref 70–99)
Glucose-Capillary: 183 mg/dL — ABNORMAL HIGH (ref 70–99)
Glucose-Capillary: 206 mg/dL — ABNORMAL HIGH (ref 70–99)
Glucose-Capillary: 232 mg/dL — ABNORMAL HIGH (ref 70–99)

## 2020-03-09 LAB — SARS CORONAVIRUS 2 (TAT 6-24 HRS): SARS Coronavirus 2: NEGATIVE

## 2020-03-09 LAB — HEMOGLOBIN A1C
Hgb A1c MFr Bld: 9.5 % — ABNORMAL HIGH (ref 4.8–5.6)
Mean Plasma Glucose: 225.95 mg/dL

## 2020-03-09 MED ORDER — METOCLOPRAMIDE HCL 10 MG PO TABS: 5.0000 mg | ORAL_TABLET | Freq: Three times a day (TID) | ORAL | Status: DC

## 2020-03-09 MED ORDER — MORPHINE SULFATE (PF) 4 MG/ML IV SOLN
4.0000 mg | Freq: Four times a day (QID) | INTRAVENOUS | Status: DC | PRN
  Administered 2020-03-09 – 2020-03-12 (×10): 4 mg via INTRAVENOUS
  Filled 2020-03-09 (×10): qty 1

## 2020-03-09 MED ORDER — METOCLOPRAMIDE HCL 5 MG/ML IJ SOLN
5.0000 mg | Freq: Two times a day (BID) | INTRAMUSCULAR | Status: DC
  Administered 2020-03-09 – 2020-03-12 (×5): 5 mg via INTRAVENOUS
  Filled 2020-03-09 (×5): qty 2

## 2020-03-09 MED ORDER — PANTOPRAZOLE SODIUM 40 MG IV SOLR
40.0000 mg | INTRAVENOUS | Status: DC
  Administered 2020-03-09 – 2020-03-11 (×3): 40 mg via INTRAVENOUS
  Filled 2020-03-09 (×3): qty 40

## 2020-03-09 NOTE — Progress Notes (Signed)
PROGRESS NOTE    Anne [name]  [id number] DOB: 01-17-1981 DOA: 03/08/2020 PCP: Alfonse Flavors, MD  Chief Complaint  Patient presents with  . Chest Pain    Brief Narrative:  As per H&P written by Dr. Olevia Bowens on 03/08/2020 39 y.o. female with medical history significant of GERD, anxiety, osteoarthritis, stage II CKD, history of acute renal failure, type 2 diabetes, gastroparesis, hyperlipidemia, hypertension, osteomyelitis, partial right foot amputation, CAD, late presentation anterior MI in December, 2019 with acute MI revascularization, Dressler syndrome who is coming to the emergency department with complaints of chest tightness since the morning.  The patient states that she woke up nauseous in the morning.  She took some Zofran and stay in bed.  Later in the morning, she had nausea again with precordial chest pain radiating outwards associated with palpitations, diaphoresis and dizziness.  After this, she took more  Zofran and sublingual NTG without significant results, likely because she took her all other medications and developed 2 episodes of emesis after this.  She stayed reclining or in bed thinking that he would get better, but subsequently came to the ED early in the afternoon.  She has some headaches from nitroglycerin.  She denies orthopnea, PND or recent pitting edema of the lower extremities.  She denies fever, chills, rhinorrhea, but states she has some sore throat following vomiting.  No current dyspnea, has some mild transient cough after vomiting, but has not been vomiting before or since then.  She denies wheezing or hemoptysis.  No abdominal pain, diarrhea, constipation, melena or hematochezia.  No dysuria, frequency or hematuria.  No polyuria, polydipsia, polyphagia, states she has some blurred vision, which she attributes to her eyes condition along with nausea and emesis earlier.  ED Course: Initial vital signs temperature 98.1 F, pulse 90, respiration 13, blood  pressure 131/91 mmHg and O2 sat 97% on room air.  The patient received morphine 4 mg IVP, Zofran 4 mg IVP insulin nitroglycerin in the emergency department.  Dr. Domenic Polite was contacted who recommended overnight monitoring with a repeat ECG in a.m.  If she is chest pain-free without ECG changes she may go home.  He also suggested for the patient to follow-up to set up a stress test as an outpatient.   Assessment & Plan: 1-chest pain: Heart score 4; this time and appears to be noncardiac in origin. -Negative troponin x2; no acute ischemic changes on EKG or telemetry. -Continue conservative management under cardiology recommendations plan is to pursued outpatient stress test.  2-intractable nausea and vomiting -Appears to be in the setting of gastroenteritis triggered by uncontrolled gastroparesis. -Continue PPI, Reglan and as needed antiemetics. -Slowly advance diet.  3-prior history coronary artery disease -No further chest pain currently -Continue beta-blocker, continue statins, continue losartan, aspirin and [name] -Continue imdur  4-essential hypertension -stable -continue current antihypertensive medications.  5-chronic systolic heart failure -Compensated -Continue ARB, beta-blocker and diuretic regimen. -Follow daily weights and strict I's and O's.  6-hyperlipidemia -Continue statins.   DVT prophylaxis: Lovenox Code Status: Full code Family Communication: No family at bedside. Disposition:   Status is: Observation  Dispo: The patient is from: Home              Anticipated d/c is to: Home              Anticipated d/c date is: 03/10/20              Patient currently Unable to be discharged secondary to intractable nausea/vomiting and  inability to keep food or medications down.  Transition some of her medications of IV route to assure tolerance.  Diet downgraded to just full liquids and continue the use of antiemetics.  Follow clinical response       Consultants:    None  Procedures:   See below for x-ray reports.   Antimicrobials:   None   Subjective: Patient complaining of intractable nausea and vomiting; having difficulty keeping food or medications down.  No further chest pain.  No fever  Objective: Vitals:   03/09/20 0516 03/09/20 0828 03/09/20 0908 03/09/20 1350  BP: (!) 139/96  (!) 143/102 112/71  Pulse: 95  96 96  Resp: 17  16 17   Temp: 98.3 F (36.8 C)  98.1 F (36.7 C) (!) 97 F (36.1 C)  TempSrc: Oral  Oral   SpO2: 98% 98% 99% 99%  Weight:      Height:        Intake/Output Summary (Last 24 hours) at 03/09/2020 1609 Last data filed at 03/09/2020 1300 Gross per 24 hour  Intake 360 ml  Output --  Net 360 ml   Filed Weights   03/08/20 1358  Weight: 89.8 kg    Examination: General exam: Appears in mild distress secondary to intractable  nausea/vomiting.  Patient demonstrating difficulty keeping medications and food down at this time.  No fever, no further chest pain.  Denies abdominal pain, dysuria, hematuria or any other complaints. Respiratory system: No wheezing, no crackles.  Good air movement bilaterally. Cardiovascular system: No JVD, no murmurs, no rubs, no gallops.  S1 and S2 appreciated on exam. Gastrointestinal system: Abdomen is nondistended, soft and nontender. No organomegaly or masses felt. Normal bowel sounds heard. Central nervous system: Alert and oriented. No focal neurological deficits.  Moving 4 limbs spontaneously. Extremities: Partial right foot amputation, no cyanosis or clubbing.  No open wounds. Skin: No rashes, no petechiae Psychiatry: Judgement and insight appear normal. Mood & affect appropriate.     Data Reviewed: I have personally reviewed following labs and imaging studies  CBC: Recent Labs  Lab 03/08/20 1400  WBC 10.9*  HGB 12.7  HCT 36.9  MCV 88.3  PLT 0000000    Basic Metabolic Panel: Recent Labs  Lab 03/08/20 1400  NA 131*  K 4.2  CL 97*  CO2 23  GLUCOSE 315*  BUN  20  CREATININE 1.27*  CALCIUM 8.8*    GFR: Estimated Creatinine Clearance: 72.3 mL/min (A) (by C-G formula based on SCr of 1.27 mg/dL (H)).  CBG: Recent Labs  Lab 03/08/20 1901 03/08/20 2358 03/09/20 0733 03/09/20 1111  GLUCAP 195* 170* 173* 183*    Radiology Studies: DG Chest 2 View  Result Date: 03/08/2020 CLINICAL DATA:  Awoke with chest pain and nausea. Episode of vomiting. EXAM: CHEST - 2 VIEW COMPARISON:  Rib radiographs 07/17/2019, chest radiograph 05/03/2019 FINDINGS: Heart is normal in size.The cardiomediastinal contours are normal. The lungs are clear. Pulmonary vasculature is normal. No consolidation, pleural effusion, or pneumothorax. No acute osseous abnormalities are seen. Mild degenerative change in the thoracic spine. IMPRESSION: No acute chest finding. Electronically Signed   By: Keith Rake M.D.   On: 03/08/2020 15:15    Scheduled Meds: . aspirin  81 mg Oral Q breakfast  . atorvastatin  80 mg Oral QPM  . brimonidine  1 drop Right Eye TID  . carvedilol  9.375 mg Oral BID  . dorzolamide-timolol  1 drop Right Eye TID  . enoxaparin (LOVENOX) injection  40 mg  Subcutaneous Q24H  . fenofibrate  160 mg Oral Daily  . furosemide  40 mg Oral BID  . insulin aspart  0-15 Units Subcutaneous TID WC  . insulin glargine  50 Units Subcutaneous QHS  . isosorbide mononitrate  30 mg Oral Daily  . losartan  25 mg Oral QPM  . metFORMIN  1,000 mg Oral BID WC  . metoCLOPramide (REGLAN) injection  5 mg Intravenous Q12H  . pantoprazole (PROTONIX) IV  40 mg Intravenous Q24H  . potassium chloride  10 mEq Oral BID  . sertraline  200 mg Oral QHS  . sodium chloride flush  3 mL Intravenous Once  . spironolactone  12.5 mg Oral Daily  . ticagrelor  90 mg Oral BID   Continuous Infusions:   LOS: 0 days    Time spent: 30 minutes    Barton Dubois, MD [facility]   To contact the attending provider between 7A-7P or the covering provider during after hours 7P-7A,  please log into the web site [email or web address] and access using universal [place] password for that web site. If you do not have the password, please call the hospital operator.  03/09/2020, 4:09 PM

## 2020-03-09 NOTE — Plan of Care (Signed)

## 2020-03-10 DIAGNOSIS — I5022 Chronic systolic (congestive) heart failure: Secondary | ICD-10-CM | POA: Diagnosis not present

## 2020-03-10 DIAGNOSIS — I10 Essential (primary) hypertension: Secondary | ICD-10-CM | POA: Diagnosis not present

## 2020-03-10 DIAGNOSIS — R079 Chest pain, unspecified: Secondary | ICD-10-CM | POA: Diagnosis not present

## 2020-03-10 DIAGNOSIS — E7800 Pure hypercholesterolemia, unspecified: Secondary | ICD-10-CM | POA: Diagnosis not present

## 2020-03-10 LAB — BASIC METABOLIC PANEL
Anion gap: 13 (ref 5–15)
BUN: 22 mg/dL — ABNORMAL HIGH (ref 6–20)
CO2: 24 mmol/L (ref 22–32)
Calcium: 8.9 mg/dL (ref 8.9–10.3)
Chloride: 97 mmol/L — ABNORMAL LOW (ref 98–111)
Creatinine, Ser: 1.29 mg/dL — ABNORMAL HIGH (ref 0.44–1.00)
GFR calc Af Amer: >60 mL/min (ref >60)
GFR calc non Af Amer: 52 mL/min — ABNORMAL LOW (ref >60)
Glucose, Bld: 218 mg/dL — ABNORMAL HIGH (ref 70–99)
Potassium: 3.6 mmol/L (ref 3.5–5.1)
Sodium: 134 mmol/L — ABNORMAL LOW (ref 135–145)

## 2020-03-10 LAB — GLUCOSE, CAPILLARY
Glucose-Capillary: 160 mg/dL — ABNORMAL HIGH (ref 70–99)
Glucose-Capillary: 220 mg/dL — ABNORMAL HIGH (ref 70–99)
Glucose-Capillary: 150 mg/dL — ABNORMAL HIGH (ref 70–99)
Glucose-Capillary: 271 mg/dL — ABNORMAL HIGH (ref 70–99)

## 2020-03-10 MED ORDER — ONDANSETRON HCL 4 MG/2ML IJ SOLN
4.0000 mg | Freq: Three times a day (TID) | INTRAMUSCULAR | Status: DC
  Administered 2020-03-10 – 2020-03-12 (×5): 4 mg via INTRAVENOUS
  Filled 2020-03-10 (×5): qty 2

## 2020-03-10 NOTE — Progress Notes (Signed)
Holding AM meds at patient's request because she is nauseous.

## 2020-03-10 NOTE — Progress Notes (Signed)
PROGRESS NOTE    Anne [name]  [id number] DOB: 06-Aug-1981 DOA: 03/08/2020 PCP: Alfonse Flavors, MD  Chief Complaint  Patient presents with  . Chest Pain    Brief Narrative:  As per H&P written by Dr. Olevia Bowens on 03/08/2020 39 y.o. female with medical history significant of GERD, anxiety, osteoarthritis, stage II CKD, history of acute renal failure, type 2 diabetes, gastroparesis, hyperlipidemia, hypertension, osteomyelitis, partial right foot amputation, CAD, late presentation anterior MI in December, 2019 with acute MI revascularization, Dressler syndrome who is coming to the emergency department with complaints of chest tightness since the morning.  The patient states that she woke up nauseous in the morning.  She took some Zofran and stay in bed.  Later in the morning, she had nausea again with precordial chest pain radiating outwards associated with palpitations, diaphoresis and dizziness.  After this, she took more  Zofran and sublingual NTG without significant results, likely because she took her all other medications and developed 2 episodes of emesis after this.  She stayed reclining or in bed thinking that he would get better, but subsequently came to the ED early in the afternoon.  She has some headaches from nitroglycerin.  She denies orthopnea, PND or recent pitting edema of the lower extremities.  She denies fever, chills, rhinorrhea, but states she has some sore throat following vomiting.  No current dyspnea, has some mild transient cough after vomiting, but has not been vomiting before or since then.  She denies wheezing or hemoptysis.  No abdominal pain, diarrhea, constipation, melena or hematochezia.  No dysuria, frequency or hematuria.  No polyuria, polydipsia, polyphagia, states she has some blurred vision, which she attributes to her eyes condition along with nausea and emesis earlier.  ED Course: Initial vital signs temperature 98.1 F, pulse 90, respiration 13, blood  pressure 131/91 mmHg and O2 sat 97% on room air.  The patient received morphine 4 mg IVP, Zofran 4 mg IVP insulin nitroglycerin in the emergency department.  Dr. Domenic Polite was contacted who recommended overnight monitoring with a repeat ECG in a.m.  If she is chest pain-free without ECG changes she may go home.  He also suggested for the patient to follow-up to set up a stress test as an outpatient.   Assessment & Plan: 1-chest pain: Heart score 4; this time and appears to be noncardiac in origin. -Negative troponin x2; no acute ischemic changes on EKG or telemetry. -Continue conservative management under cardiology recommendations plan is to pursued outpatient stress test.  2-intractable nausea and vomiting -Appears to be in the setting of gastroenteritis triggered by uncontrolled gastroparesis. -Continue PPI, Reglan and will schedule antiemetics. -Slowly advance diet.  3-prior history coronary artery disease -No further chest pain currently -Continue beta-blocker, continue statins, continue losartan, aspirin and [name] -Continue imdur  4-essential hypertension -stable -continue current antihypertensive medications.  5-chronic systolic heart failure -Compensated -Continue ARB, beta-blocker and diuretic regimen. -Follow daily weights and strict I's and O's.  6-hyperlipidemia -Continue statins.   DVT prophylaxis: Lovenox Code Status: Full code Family Communication: No family at bedside. Disposition:   Status is: Observation  Dispo: The patient is from: Home              Anticipated d/c is to: Home              Anticipated d/c date is: 03/11/20              Patient currently Unable to be discharged secondary to intractable nausea/vomiting and  inability to keep food or medications down.  Transition some of her medications of IV route to assure tolerance.  Diet downgraded to just full liquids and continue the use of antiemetics.  Follow clinical response     Consultants:    None  Procedures:   See below for x-ray reports.   Antimicrobials:   None   Subjective: No fever, no further chest pain.  Continue reporting ongoing nausea and intermittent vomiting episodes.  Objective: Vitals:   03/09/20 1350 03/09/20 2039 03/10/20 0550 03/10/20 1339  BP: 112/71 124/87 111/73 118/72  Pulse: 96 (!) 101 86 86  Resp: 17 20 20 18   Temp: (!) 97 F (36.1 C) 98.3 F (36.8 C) 98.4 F (36.9 C) (!) 97.5 F (36.4 C)  TempSrc:  Oral Oral   SpO2: 99% 99% 98% 100%  Weight:      Height:        Intake/Output Summary (Last 24 hours) at 03/10/2020 1632 Last data filed at 03/10/2020 1456 Gross per 24 hour  Intake 600 ml  Output --  Net 600 ml   Filed Weights   03/08/20 1358  Weight: 89.8 kg    Examination: General exam: Alert, awake, oriented x 3; ports feeling better; no further chest pain.  Still having ongoing nausea and intermittent vomiting episodes. Afebrile.  Respiratory system: no wheezing, no crackles. Respiratory effort normal. Cardiovascular system:RRR. No murmurs, rubs, gallops. Gastrointestinal system: Abdomen is nondistended, soft and nontender. No organomegaly or masses felt. Normal bowel sounds heard. Central nervous system: Alert and oriented. No focal neurological deficits. Extremities: No cyanosis, no clubbing, right foot with partial amputation. Skin: No rashes, no petechiae. Psychiatry: Judgement and insight appear normal. Mood & affect appropriate.    Data Reviewed: I have personally reviewed following labs and imaging studies  CBC: Recent Labs  Lab 03/08/20 1400  WBC 10.9*  HGB 12.7  HCT 36.9  MCV 88.3  PLT 0000000    Basic Metabolic Panel: Recent Labs  Lab 03/08/20 1400 03/10/20 0528  NA 131* 134*  K 4.2 3.6  CL 97* 97*  CO2 23 24  GLUCOSE 315* 218*  BUN 20 22*  CREATININE 1.27* 1.29*  CALCIUM 8.8* 8.9    GFR: Estimated Creatinine Clearance: 71.2 mL/min (A) (by C-G formula based on SCr of 1.29 mg/dL  (H)).  CBG: Recent Labs  Lab 03/09/20 1111 03/09/20 1656 03/09/20 2045 03/10/20 0727 03/10/20 1147  GLUCAP 183* 206* 232* 160* 220*    Radiology Studies: No results found.  Scheduled Meds: . aspirin  81 mg Oral Q breakfast  . atorvastatin  80 mg Oral QPM  . brimonidine  1 drop Right Eye TID  . carvedilol  9.375 mg Oral BID  . dorzolamide-timolol  1 drop Right Eye TID  . enoxaparin (LOVENOX) injection  40 mg Subcutaneous Q24H  . fenofibrate  160 mg Oral Daily  . furosemide  40 mg Oral BID  . insulin aspart  0-15 Units Subcutaneous TID WC  . insulin glargine  50 Units Subcutaneous QHS  . isosorbide mononitrate  30 mg Oral Daily  . losartan  25 mg Oral QPM  . metFORMIN  1,000 mg Oral BID WC  . metoCLOPramide (REGLAN) injection  5 mg Intravenous Q12H  . pantoprazole (PROTONIX) IV  40 mg Intravenous Q24H  . potassium chloride  10 mEq Oral BID  . sertraline  200 mg Oral QHS  . sodium chloride flush  3 mL Intravenous Once  . spironolactone  12.5 mg Oral Daily  .  ticagrelor  90 mg Oral BID   Continuous Infusions:   LOS: 1 day    Time spent: 30 minutes    Barton Dubois, MD [facility]   To contact the attending provider between 7A-7P or the covering provider during after hours 7P-7A, please log into the web site [email or web address] and access using universal [place] password for that web site. If you do not have the password, please call the hospital operator.  03/10/2020, 4:32 PM

## 2020-03-10 NOTE — Plan of Care (Signed)
  Problem: Education: Goal: Knowledge of General Education information will improve Description: Including pain rating scale, medication(s)/side effects and non-pharmacologic comfort measures Outcome: Progressing   Problem: Clinical Measurements: Goal: Will remain free from infection Outcome: Progressing   

## 2020-03-11 DIAGNOSIS — I10 Essential (primary) hypertension: Secondary | ICD-10-CM | POA: Diagnosis not present

## 2020-03-11 DIAGNOSIS — I5022 Chronic systolic (congestive) heart failure: Secondary | ICD-10-CM | POA: Diagnosis not present

## 2020-03-11 DIAGNOSIS — E7800 Pure hypercholesterolemia, unspecified: Secondary | ICD-10-CM | POA: Diagnosis not present

## 2020-03-11 DIAGNOSIS — R079 Chest pain, unspecified: Secondary | ICD-10-CM | POA: Diagnosis not present

## 2020-03-11 LAB — GLUCOSE, CAPILLARY
Glucose-Capillary: 150 mg/dL — ABNORMAL HIGH (ref 70–99)
Glucose-Capillary: 158 mg/dL — ABNORMAL HIGH (ref 70–99)
Glucose-Capillary: 180 mg/dL — ABNORMAL HIGH (ref 70–99)
Glucose-Capillary: 123 mg/dL — ABNORMAL HIGH (ref 70–99)

## 2020-03-11 NOTE — Progress Notes (Signed)
PROGRESS NOTE    Anne [name]  [id number] DOB: 05-Aug-1981 DOA: 03/08/2020 PCP: Alfonse Flavors, MD  Chief Complaint  Patient presents with  . Chest Pain    Brief Narrative:  As per H&P written by Dr. Olevia Bowens on 03/08/2020 39 y.o. female with medical history significant of GERD, anxiety, osteoarthritis, stage II CKD, history of acute renal failure, type 2 diabetes, gastroparesis, hyperlipidemia, hypertension, osteomyelitis, partial right foot amputation, CAD, late presentation anterior MI in December, 2019 with acute MI revascularization, Dressler syndrome who is coming to the emergency department with complaints of chest tightness since the morning.  The patient states that she woke up nauseous in the morning.  She took some Zofran and stay in bed.  Later in the morning, she had nausea again with precordial chest pain radiating outwards associated with palpitations, diaphoresis and dizziness.  After this, she took more  Zofran and sublingual NTG without significant results, likely because she took her all other medications and developed 2 episodes of emesis after this.  She stayed reclining or in bed thinking that he would get better, but subsequently came to the ED early in the afternoon.  She has some headaches from nitroglycerin.  She denies orthopnea, PND or recent pitting edema of the lower extremities.  She denies fever, chills, rhinorrhea, but states she has some sore throat following vomiting.  No current dyspnea, has some mild transient cough after vomiting, but has not been vomiting before or since then.  She denies wheezing or hemoptysis.  No abdominal pain, diarrhea, constipation, melena or hematochezia.  No dysuria, frequency or hematuria.  No polyuria, polydipsia, polyphagia, states she has some blurred vision, which she attributes to her eyes condition along with nausea and emesis earlier.  ED Course: Initial vital signs temperature 98.1 F, pulse 90, respiration 13, blood  pressure 131/91 mmHg and O2 sat 97% on room air.  The patient received morphine 4 mg IVP, Zofran 4 mg IVP insulin nitroglycerin in the emergency department.  Dr. Domenic Polite was contacted who recommended overnight monitoring with a repeat ECG in a.m.  If she is chest pain-free without ECG changes she may go home.  He also suggested for the patient to follow-up to set up a stress test as an outpatient.   Assessment & Plan: 1-chest pain: Heart score 4; this time and appears to be noncardiac in origin. -Negative troponin x2; no acute ischemic changes on EKG or telemetry. -Continue conservative management under cardiology recommendations plan is to pursued outpatient stress test.  2-intractable nausea and vomiting -Appears to be in the setting of gastroenteritis triggered by uncontrolled gastroparesis. -Continue PPI, Reglan and schedule antiemetics. -Will advance diet to soft; assess tolerance and response.  Hopefully discharge home on 03/12/2020 if diet tolerated.  3-prior history coronary artery disease -No further chest pain currently -Continue beta-blocker, continue statins, continue losartan, aspirin and [name] -Continue imdur  4-essential hypertension -stable -continue current antihypertensive medications.  5-chronic systolic heart failure -Compensated -Continue ARB, beta-blocker and diuretic regimen. -Follow daily weights and strict I's and O's.  6-hyperlipidemia -Continue statins.   DVT prophylaxis: Lovenox Code Status: Full code Family Communication: No family at bedside. Disposition:   Status is: Observation  Dispo: The patient is from: Home              Anticipated d/c is to: Home              Anticipated d/c date is: 03/12/20  Patient currently Unable to be discharged secondary to intractable nausea/vomiting and inability to keep food or medications down.  Son of her medication has been transition to IV route to assure tolerance.  Advance diet to soft diet;  continue to follow clinical response.     Consultants:   None  Procedures:   See below for x-ray reports.   Antimicrobials:   None   Subjective: No fever, no further chest pain.  Patient expressed still having ongoing nausea but no further vomiting at this time.  Willing to have diet advanced.   Objective: Vitals:   03/10/20 1339 03/10/20 2103 03/11/20 0548 03/11/20 1428  BP: 118/72 (!) 134/95 120/90 105/75  Pulse: 86 91 85 98  Resp: 18 20 16 18   Temp: (!) 97.5 F (36.4 C) 98.1 F (36.7 C) 97.6 F (36.4 C) 98 F (36.7 C)  TempSrc:  Oral Oral   SpO2: 100% 100% 98% 95%  Weight:      Height:        Intake/Output Summary (Last 24 hours) at 03/11/2020 1739 Last data filed at 03/11/2020 1230 Gross per 24 hour  Intake 360 ml  Output --  Net 360 ml   Filed Weights   03/08/20 1358  Weight: 89.8 kg    Examination: General exam: Alert, awake, oriented x 3; reports he still feeling nauseated but no further vomiting.  In agreement to have diet advanced.  No fever, no chest pain, no shortness of breath.  Patient is afebrile. Respiratory system: Clear to auscultation. Respiratory effort normal. Cardiovascular system:RRR. No murmurs, rubs, gallops. Gastrointestinal system: Abdomen is nondistended, soft and nontender. No organomegaly or masses felt. Normal bowel sounds heard. Central nervous system: Alert and oriented. No focal neurological deficits. Extremities: No cyanosis or clubbing; right foot with partial amputation appreciated.  No active wounds. Skin: No rashes, no petechiae. Psychiatry: Judgement and insight appear normal. Mood & affect appropriate.    Data Reviewed: I have personally reviewed following labs and imaging studies  CBC: Recent Labs  Lab 03/08/20 1400  WBC 10.9*  HGB 12.7  HCT 36.9  MCV 88.3  PLT 0000000    Basic Metabolic Panel: Recent Labs  Lab 03/08/20 1400 03/10/20 0528  NA 131* 134*  K 4.2 3.6  CL 97* 97*  CO2 23 24  GLUCOSE 315*  218*  BUN 20 22*  CREATININE 1.27* 1.29*  CALCIUM 8.8* 8.9    GFR: Estimated Creatinine Clearance: 71.2 mL/min (A) (by C-G formula based on SCr of 1.29 mg/dL (H)).  CBG: Recent Labs  Lab 03/10/20 1640 03/10/20 2100 03/11/20 0731 03/11/20 1104 03/11/20 1629  GLUCAP 150* 271* 150* 158* 180*    Radiology Studies: No results found.  Scheduled Meds: . aspirin  81 mg Oral Q breakfast  . atorvastatin  80 mg Oral QPM  . brimonidine  1 drop Right Eye TID  . carvedilol  9.375 mg Oral BID  . dorzolamide-timolol  1 drop Right Eye TID  . enoxaparin (LOVENOX) injection  40 mg Subcutaneous Q24H  . fenofibrate  160 mg Oral Daily  . furosemide  40 mg Oral BID  . insulin aspart  0-15 Units Subcutaneous TID WC  . insulin glargine  50 Units Subcutaneous QHS  . isosorbide mononitrate  30 mg Oral Daily  . losartan  25 mg Oral QPM  . metFORMIN  1,000 mg Oral BID WC  . metoCLOPramide (REGLAN) injection  5 mg Intravenous Q12H  . ondansetron (ZOFRAN) IV  4 mg Intravenous Q8H  .  pantoprazole (PROTONIX) IV  40 mg Intravenous Q24H  . potassium chloride  10 mEq Oral BID  . sertraline  200 mg Oral QHS  . sodium chloride flush  3 mL Intravenous Once  . spironolactone  12.5 mg Oral Daily  . ticagrelor  90 mg Oral BID   Continuous Infusions:   LOS: 2 days    Time spent: 30 minutes    Barton Dubois, MD [facility]   To contact the attending provider between 7A-7P or the covering provider during after hours 7P-7A, please log into the web site [email or web address] and access using universal [place] password for that web site. If you do not have the password, please call the hospital operator.  03/11/2020, 5:39 PM

## 2020-03-12 DIAGNOSIS — E7800 Pure hypercholesterolemia, unspecified: Secondary | ICD-10-CM | POA: Diagnosis not present

## 2020-03-12 DIAGNOSIS — I249 Acute ischemic heart disease, unspecified: Secondary | ICD-10-CM | POA: Diagnosis not present

## 2020-03-12 DIAGNOSIS — I5022 Chronic systolic (congestive) heart failure: Secondary | ICD-10-CM | POA: Diagnosis not present

## 2020-03-12 DIAGNOSIS — I10 Essential (primary) hypertension: Secondary | ICD-10-CM | POA: Diagnosis not present

## 2020-03-12 LAB — CBC
HCT: 33.9 % — ABNORMAL LOW (ref 36.0–46.0)
Hemoglobin: 11 g/dL — ABNORMAL LOW (ref 12.0–15.0)
MCH: 29.7 pg (ref 26.0–34.0)
MCHC: 32.4 g/dL (ref 30.0–36.0)
MCV: 91.6 fL (ref 80.0–100.0)
Platelets: 223 10*3/uL (ref 150–400)
RBC: 3.7 MIL/uL — ABNORMAL LOW (ref 3.87–5.11)
RDW: 13 % (ref 11.5–15.5)
WBC: 7.8 10*3/uL (ref 4.0–10.5)
nRBC: 0 % (ref 0.0–0.2)

## 2020-03-12 LAB — GLUCOSE, CAPILLARY
Glucose-Capillary: 169 mg/dL — ABNORMAL HIGH (ref 70–99)
Glucose-Capillary: 112 mg/dL — ABNORMAL HIGH (ref 70–99)

## 2020-03-12 LAB — BASIC METABOLIC PANEL
Anion gap: 13 (ref 5–15)
BUN: 23 mg/dL — ABNORMAL HIGH (ref 6–20)
CO2: 23 mmol/L (ref 22–32)
Calcium: 8.6 mg/dL — ABNORMAL LOW (ref 8.9–10.3)
Chloride: 99 mmol/L (ref 98–111)
Creatinine, Ser: 1.61 mg/dL — ABNORMAL HIGH (ref 0.44–1.00)
GFR calc Af Amer: 46 mL/min — ABNORMAL LOW (ref >60)
GFR calc non Af Amer: 40 mL/min — ABNORMAL LOW (ref >60)
Glucose, Bld: 171 mg/dL — ABNORMAL HIGH (ref 70–99)
Potassium: 3.3 mmol/L — ABNORMAL LOW (ref 3.5–5.1)
Sodium: 135 mmol/L (ref 135–145)

## 2020-03-12 MED ORDER — PROMETHAZINE HCL 12.5 MG RE SUPP: 12.5000 mg | RECTAL | 0 refills | Status: DC | PRN

## 2020-03-12 MED ORDER — METOCLOPRAMIDE HCL 5 MG PO TABS: 5.0000 mg | ORAL_TABLET | Freq: Three times a day (TID) | ORAL | 1 refills | Status: AC

## 2020-03-12 MED ORDER — PANTOPRAZOLE SODIUM 40 MG PO TBEC: 40.0000 mg | DELAYED_RELEASE_TABLET | Freq: Every day | ORAL | 1 refills | Status: AC

## 2020-03-12 MED ORDER — ONDANSETRON 8 MG PO TBDP: 4.0000 mg | ORAL_TABLET | Freq: Three times a day (TID) | ORAL | 0 refills | Status: DC

## 2020-03-12 NOTE — Progress Notes (Signed)
Discharge instructions reviewed with patient. Given AVS and zofran prescription. Patient aware to pick up other prescriptions sent to her pharmacy by provider. States she will follow-up with PCP, cardiology and GI as directed. Verbalized understanding of instructions. IV site removed, site within normal limits. Patient in stable condition awaiting family arrival for discharge home.

## 2020-03-12 NOTE — Discharge Summary (Signed)
Physician Discharge Summary  Anne [name] [id number] DOB: Feb 10, 1981 DOA: 03/08/2020  PCP: Alfonse Flavors, MD  Admit date: 03/08/2020 Discharge date: 03/12/2020  Time spent: 35 minutes  Recommendations for Outpatient Follow-up:  1. Repeat basic metabolic panel to evaluate lites and renal function 2. Continue close monitoring of patient's CBGs with further adjustment to hypoglycemic regimen as needed 3. Make sure patient has follow-up as an outpatient with gastroenterology service for further evaluation and management of her gastroparesis condition.   Discharge Diagnoses:  Principal Problem:   ACS (acute coronary syndrome) (HCC) Active Problems:   Type 2 diabetes mellitus with vascular disease (HCC)   Essential hypertension   CAD (coronary artery disease)   Chronic systolic CHF (congestive heart failure) (HCC)   Hypercholesteremia   Intractable nausea and vomiting   Discharge Condition: Stable and improved.  Discharged home with instruction to follow-up with PCP, cardiology and gastroenterology service.  CODE STATUS: Full code.  Diet recommendation: Heart healthy and modify carbohydrate diet.  Filed Weights   03/08/20 1358  Weight: 89.8 kg    History of present illness:  As per H&P written by Dr. 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 The patient states that she woke up nauseous in the morning. She took some Zofran and stay in bed. Later in the morning, she had nausea again with precordial chest pain radiating outwards associated with palpitations,  diaphoresis and dizziness. After this, she took more Zofran and sublingual NTG without significant results, likely because she took her all other medications and developed 2 episodes of emesis after this. She stayed reclining or in bed thinking that he would get better, but subsequently came to the ED early in the afternoon. She has some headaches from nitroglycerin. She denies orthopnea, PND or recent pitting edema of the lower extremities. She denies fever, chills, rhinorrhea, but states she has some sore throat following vomiting. No current dyspnea, has some mild transient cough after vomiting, but has not been vomiting before or since then. She denies wheezing or hemoptysis. No abdominal pain, diarrhea, constipation, melena or hematochezia. No dysuria, frequency or hematuria. No polyuria, polydipsia, polyphagia, states she has some blurred vision, which she attributes to her eyes condition along with nausea and emesis earlier.  ED Course:Initial vital signs temperature 98.1 F, pulse 90, respiration 13, blood pressure 131/91 mmHg and O2 sat 97% on room air. The patient received morphine 4 mg IVP, Zofran 4 mg IVP insulin nitroglycerin in the emergency department.Dr. Domenic Polite was contacted who recommended overnight monitoring with a repeat ECG in a.m. If she is chest pain-free without ECG changes she may go home. He also suggested for the patient to follow-up to set up a stress test as an outpatient.  Hospital Course:  1-chest pain: Heart score 4; this time and appears to be noncardiac in origin. -Negative troponin x2; no acute ischemic changes on EKG or telemetry. -Continue conservative management under cardiology recommendations plan is to pursued outpatient stress test.  2-intractable nausea and vomiting -Appears to be in the setting of gastroenteritis triggered by uncontrolled gastroparesis. -Continue PPI, Reglan and schedule antiemetics. -Tolerating diet and with symptoms  controlled and at discharge. -Patient will follow up with gastroenterology  as an outpatient.  3-prior history coronary artery disease -No further chest pain currently -Continue beta-blocker, continue statins, continue losartan, aspirin and [name] -Continue also the use of imdur -Continue outpatient follow-up with cardiology service with plans for stress test as an outpatient.  4-essential hypertension -stable and well controlled. -continue current antihypertensive medications. -Heart healthy diet has been encouraged.  5-chronic systolic heart failure -Compensated -Continue ARB, beta-blocker and diuretic regimen. -Follow daily weights and low-sodium diet -Continue outpatient follow-up with cardiology service.  6-hyperlipidemia -Continue statins.  7-type 2 diabetes with peripheral vascular disease -Resume home hypoglycemic regimen -Advised to follow modified carbohydrate diet -Patient instructed to keep yourself well-hydrated.   Procedures:  See below for x-ray reports  Consultations:  None  Discharge Exam: Vitals:   03/11/20 2121 03/12/20 0600  BP:  115/82  Pulse:  83  Resp:  18  Temp:  98.4 F (36.9 C)  SpO2: 96% 97%   General exam: Alert, awake, oriented x 3; reports no further episodes of nausea or vomiting; has been able to tolerate diet and medications without any problems.  She feels ready to go home.  No chest pain, no shortness of breath, no fever.   Respiratory system: Clear to auscultation. Respiratory effort normal. Cardiovascular system:RRR. No murmurs, rubs, gallops. Gastrointestinal system: Abdomen is nondistended, soft and nontender. No organomegaly or masses felt. Normal bowel sounds heard. Central nervous system: Alert and oriented. No focal neurological deficits. Extremities: No cyanosis or clubbing; right foot with partial amputation appreciated.  No active wounds. Skin: No rashes, no petechiae. Psychiatry: Judgement and insight appear  normal. Mood & affect appropriate.    Discharge Instructions   Discharge Instructions    (HEART FAILURE PATIENTS) Call MD:  Anytime you have any of the following symptoms: 1) 3 pound weight gain in 24 hours or 5 pounds in 1 week 2) shortness of breath, with or without a dry hacking cough 3) swelling in the hands, feet or stomach 4) if you have to sleep on extra pillows at night in order to breathe.   Complete by: As directed    Diet - low sodium heart healthy   Complete by: As directed    Discharge instructions   Complete by: As directed    Small multiple meals a day Maintain adequate hydration Arrange follow-up with PCP in 10 days Follow-up with cardiology service as instructed Follow-up with gastroenterologist in the next 2 weeks. Take medications as prescribed   Increase activity slowly   Complete by: As directed      Allergies as of 03/12/2020      Reactions   Canagliflozin Other (See Comments)   Vaginal yeast infections   Nsaids Other (See Comments)   Yeast infection      Medication List    TAKE these medications   albuterol 108 (90 Base) MCG/ACT inhaler Commonly known as: VENTOLIN HFA Inhale 2 puffs into the lungs every 6 (six) hours as needed for wheezing or shortness of breath.   aspirin 81 MG EC tablet Commonly known as: GNP Aspirin Low Dose Take 1 tablet (81 mg total) by mouth daily with breakfast. Swallow whole.   atorvastatin 80 MG tablet Commonly known as: LIPITOR Take 1 tablet (80 mg total) by mouth every evening.   Baqsimi Two Pack 3 MG/DOSE Powd Generic drug: Glucagon Place 3 mg into the nose once as needed (for emergency low blood sugar levels).   brimonidine 0.2 % ophthalmic solution Commonly known as: ALPHAGAN Place 1 drop into the right  eye 3 (three) times daily.   Bydureon BCise 2 MG/0.85ML Auij Generic drug: Exenatide ER Inject 2 mg into the skin every Thursday.   carvedilol 6.25 MG tablet Commonly known as: COREG Take 1.5 tablets  (9.375 mg total) by mouth 2 (two) times daily.   dorzolamide-timolol 22.3-6.8 MG/ML ophthalmic solution Commonly known as: COSOPT Place 1 drop into the right eye 3 (three) times daily.   fenofibrate 145 MG tablet Commonly known as: Tricor Take 1 tablet (145 mg total) by mouth daily.   furosemide 40 MG tablet Commonly known as: LASIX Take 1 tablet (40 mg total) by mouth 2 (two) times daily.   isosorbide mononitrate 30 MG 24 hr tablet Commonly known as: IMDUR Take 1 tablet (30 mg total) by mouth daily.   Lantus SoloStar 100 UNIT/ML Solostar Pen Generic drug: insulin glargine INNJECT 50 UNITS S.Q. ONCE DAILY AT 10 P.M. What changed: See the new instructions.   losartan 25 MG tablet Commonly known as: COZAAR Take 1 tablet (25 mg total) by mouth every evening.   metFORMIN 1000 MG tablet Commonly known as: GLUCOPHAGE Take 1,000 mg by mouth 2 (two) times daily with a meal.   metoCLOPramide 5 MG tablet Commonly known as: REGLAN Take 1 tablet (5 mg total) by mouth 3 (three) times daily before meals. What changed: when to take this   nitroGLYCERIN 0.4 MG SL tablet Commonly known as: Nitrostat Place 1 tablet (0.4 mg total) under the tongue every 5 (five) minutes as needed.   NovoLOG FlexPen 100 UNIT/ML FlexPen Generic drug: insulin aspart INJECT 12-18 UNITS S.Q. THREE TIMES DAILY WITH MEALS. What changed: See the new instructions.   ondansetron 8 MG disintegrating tablet Commonly known as: Zofran ODT Take 0.5 tablets (4 mg total) by mouth in the morning, at noon, and at bedtime. What changed:   medication strength  when to take this  reasons to take this   pantoprazole 40 MG tablet Commonly known as: Protonix Take 1 tablet (40 mg total) by mouth daily.   potassium chloride 10 MEQ tablet Commonly known as: KLOR-CON Take 1 tablet (10 mEq total) by mouth 2 (two) times daily.   promethazine 12.5 MG suppository Commonly known as: PHENERGAN Place 1 suppository (12.5  mg total) rectally as needed for refractory nausea / vomiting. What changed: reasons to take this   sertraline 100 MG tablet Commonly known as: ZOLOFT Take 200 mg by mouth at bedtime.   spironolactone 25 MG tablet Commonly known as: ALDACTONE Take 0.5 tablets (12.5 mg total) by mouth daily.   ticagrelor 90 MG Tabs tablet Commonly known as: [name] Take 1 tablet (90 mg total) by mouth 2 (two) times daily.      Allergies  Allergen Reactions  . Canagliflozin Other (See Comments)    Vaginal yeast infections  . Nsaids Other (See Comments)    Yeast infection    Follow-up Information    Zhou-Talbert, Elwyn Lade, MD. Schedule an appointment as soon as possible for a visit in 10 day(s).   Specialty: Family Medicine Contact information: [address] [place] [address] [phone]        Fay Records, MD .   Specialty: Cardiology Contact information: [place] [address] [address] [phone]        Evans Lance, MD .   Specialty: Cardiology Contact information: [phone] [address] [place] [address] [phone]        Havery Moros, Carlota Raspberry, MD Follow  up in 2 week(s).   Specialty: Gastroenterology Contact information: [address] [place] [address] [phone]            The results of significant diagnostics from this hospitalization (including imaging, microbiology, ancillary and laboratory) are listed below for reference.    Significant Diagnostic Studies: DG Chest 2 View  Result Date: 03/08/2020 CLINICAL DATA:  Awoke with chest pain and nausea. Episode of vomiting. EXAM: CHEST - 2 VIEW COMPARISON:  Rib radiographs 07/17/2019, chest radiograph 05/03/2019 FINDINGS: Heart is normal in size.The cardiomediastinal contours are normal. The lungs are clear. Pulmonary vasculature is normal. No consolidation, pleural effusion, or pneumothorax. No acute osseous abnormalities are seen. Mild  degenerative change in the thoracic spine. IMPRESSION: No acute chest finding. Electronically Signed   By: Keith Rake M.D.   On: 03/08/2020 15:15    Microbiology: Recent Results (from the past 240 hour(s))  SARS CORONAVIRUS 2 (TAT 6-24 HRS) Nasopharyngeal Nasopharyngeal Swab     Status: None   Collection Time: 03/08/20  7:10 PM   Specimen: Nasopharyngeal Swab  Result Value Ref Range Status   SARS Coronavirus 2 NEGATIVE NEGATIVE Final    Comment: (NOTE) SARS-CoV-2 target nucleic acids are NOT DETECTED. The SARS-CoV-2 RNA is generally detectable in upper and lower respiratory specimens during the acute phase of infection. Negative results do not preclude SARS-CoV-2 infection, do not rule out co-infections with other pathogens, and should not be used as the sole basis for treatment or other patient management decisions. Negative results must be combined with clinical observations, patient history, and epidemiological information. The expected result is Negative. Fact Sheet for Patients: SugarRoll.be Fact Sheet for Healthcare Providers: https://www.woods-mathews.com/ This test is not yet approved or cleared by the [place] FDA and  has been authorized for detection and/or diagnosis of SARS-CoV-2 by FDA under an Emergency Use Authorization (EUA). This EUA will remain  in effect (meaning this test can be used) for the duration of the COVID-19 declaration under Section 56 4(b)(1) of the Act, 21 U.S.C. section 360bbb-3(b)(1), unless the authorization is terminated or revoked sooner. Performed at [facility], [place] [address] [place], [place] [address]      Labs: Basic Metabolic Panel: Recent Labs  Lab 03/08/20 1400 03/10/20 0528 03/12/20 0454  NA 131* 134* 135  K 4.2 3.6 3.3*  CL 97* 97* 99  CO2 23 24 23   GLUCOSE 315* 218* 171*  BUN 20 22* 23*  CREATININE 1.27* 1.29* 1.61*  CALCIUM 8.8* 8.9 8.6*   CBC: Recent Labs   Lab 03/08/20 1400 03/12/20 0454  WBC 10.9* 7.8  HGB 12.7 11.0*  HCT 36.9 33.9*  MCV 88.3 91.6  PLT 278 223    BNP (last 3 results) Recent Labs    03/08/20 1400  BNP 47.0    ProBNP (last 3 results) Recent Labs    09/01/19 1137  PROBNP 313*    CBG: Recent Labs  Lab 03/11/20 1104 03/11/20 1629 03/11/20 2148 03/12/20 0739 03/12/20 1140  GLUCAP 158* 180* 123* 169* 112*    Signed:  Barton Dubois MD.  [facility] 03/12/2020, 12:11 PM

## 2020-03-13 ENCOUNTER — Encounter (INDEPENDENT_AMBULATORY_CARE_PROVIDER_SITE_OTHER): Payer: Medicaid Other | Admitting: Ophthalmology

## 2020-03-13 DIAGNOSIS — E113523 Type 2 diabetes mellitus with proliferative diabetic retinopathy with traction retinal detachment involving the macula, bilateral: Secondary | ICD-10-CM

## 2020-03-13 DIAGNOSIS — H25813 Combined forms of age-related cataract, bilateral: Secondary | ICD-10-CM

## 2020-03-13 DIAGNOSIS — H3581 Retinal edema: Secondary | ICD-10-CM

## 2020-03-13 DIAGNOSIS — H35033 Hypertensive retinopathy, bilateral: Secondary | ICD-10-CM

## 2020-03-13 DIAGNOSIS — I10 Essential (primary) hypertension: Secondary | ICD-10-CM

## 2020-04-01 ENCOUNTER — Encounter: Payer: Self-pay | Admitting: Internal Medicine

## 2020-04-01 ENCOUNTER — Other Ambulatory Visit: Payer: Self-pay

## 2020-04-01 ENCOUNTER — Ambulatory Visit: Payer: Medicaid Other | Admitting: Internal Medicine

## 2020-04-01 VITALS — BP 108/60 | HR 84 | Ht 70.0 in | Wt 196.8 lb

## 2020-04-01 DIAGNOSIS — I2510 Atherosclerotic heart disease of native coronary artery without angina pectoris: Secondary | ICD-10-CM | POA: Diagnosis not present

## 2020-04-01 NOTE — Patient Instructions (Signed)
Medication Instructions:  No changes today *If you need a refill on your cardiac medications before your next appointment, please call your pharmacy*   Lab Work: Today: BMET, LIPIDS  If you have labs (blood work) drawn today and your tests are completely normal, you will receive your results only by: Marland Kitchen MyChart Message (if you have MyChart) OR . A paper copy in the mail If you have any lab test that is abnormal or we need to change your treatment, we will call you to review the results.   Testing/Procedures: Your physician has requested that you have an echocardiogram. Echocardiography is a painless test that uses sound waves to create images of your heart. It provides your doctor with information about the size and shape of your heart and how well your heart's chambers and valves are working. This procedure takes approximately one hour. There are no restrictions for this procedure.   Follow-Up: Follow up with your physician will depend on test results.   Other Instructions

## 2020-04-01 NOTE — Progress Notes (Signed)
Cardiology Office Note   Date:  04/01/2020   ID:  Anne [name], DOB 08-21-1981, MRN [id number]  PCP:  Anne Flavors, MD  Cardiologist:   Anne Carnes, MD   Pt presents for F/U of CAD      History of Present Illness: Anne [name] is a 39 y.o. female with a history of GERD, anxiety, OA, CKD, Type II DM, gastroparesis, HL, HTN, CAD 9latpresentation anterior MI in 201912/19 >> LHC - dLM 25, mLAD 99, oOM2 100 (R-L collats), irreg RCA, EF 25-35 >> PCI: POBA to mLAD; s/p revasculariazation with POBA to LAD  ), ischemic cardiovmyopathy   Dressler syndrome.  I saw the patientt in 2016 and 2017   She has been seen by Anne [name] and Anne [name] since    She was recently admitted to [facility] on 03/08/20 with CP   Woke up with nausea  Took Zofran   Later had CP with palpitaitons, diaphoresis.  Went to ED Admitted to [facility]    Trop neg x 2.   Plan for medical Rx    She has been seen by Anne [name] in past for possible ICD   No plans made   Pt complains of chronic nausea and coughing     Current Meds  Medication Sig  . albuterol (VENTOLIN HFA) 108 (90 Base) MCG/ACT inhaler Inhale 2 puffs into the lungs every 6 (six) hours as needed for wheezing or shortness of breath.  Marland Kitchen aspirin (GNP ASPIRIN LOW DOSE) 81 MG EC tablet Take 1 tablet (81 mg total) by mouth daily with breakfast. Swallow whole.  Marland Kitchen atorvastatin (LIPITOR) 80 MG tablet Take 1 tablet (80 mg total) by mouth every evening.  Marland Kitchen BAQSIMI TWO PACK 3 MG/DOSE POWD Place 3 mg into the nose once as needed (for emergency low blood sugar levels).   . brimonidine (ALPHAGAN) 0.2 % ophthalmic solution Place 1 drop into the right eye 3 (three) times daily.  Marland Kitchen BYDUREON BCISE 2 MG/0.85ML AUIJ Inject 2 mg into the skin every Thursday.  . carvedilol (COREG) 6.25 MG tablet Take 1.5 tablets (9.375 mg total) by mouth 2 (two) times daily.  . dorzolamide-timolol (COSOPT) 22.3-6.8 MG/ML ophthalmic solution Place 1 drop into the right eye 3  (three) times daily.  . fenofibrate (TRICOR) 145 MG tablet Take 1 tablet (145 mg total) by mouth daily.  . isosorbide mononitrate (IMDUR) 30 MG 24 hr tablet Take 1 tablet (30 mg total) by mouth daily.  Marland Kitchen LANTUS SOLOSTAR 100 UNIT/ML Solostar Pen INNJECT 50 UNITS S.Q. ONCE DAILY AT 10 P.M. (Patient taking differently: Inject 50 Units into the skin at bedtime. )  . losartan (COZAAR) 25 MG tablet Take 1 tablet (25 mg total) by mouth every evening.  . metFORMIN (GLUCOPHAGE) 1000 MG tablet Take 1,000 mg by mouth 2 (two) times daily with a meal.  . metoCLOPramide (REGLAN) 5 MG tablet Take 1 tablet (5 mg total) by mouth 3 (three) times daily before meals.  . nitroGLYCERIN (NITROSTAT) 0.4 MG SL tablet Place 1 tablet (0.4 mg total) under the tongue every 5 (five) minutes as needed.  Marland Kitchen NOVOLOG FLEXPEN 100 UNIT/ML FlexPen INJECT 12-18 UNITS S.Q. THREE TIMES DAILY WITH MEALS. (Patient taking differently: Inject 12-18 Units into the skin 3 (three) times daily with meals. )  . ondansetron (ZOFRAN ODT) 8 MG disintegrating tablet Take 0.5 tablets (4 mg total) by mouth in the morning, at noon, and at bedtime.  . pantoprazole (PROTONIX) 40 MG tablet  Take 1 tablet (40 mg total) by mouth daily.  . promethazine (PHENERGAN) 12.5 MG suppository Place 1 suppository (12.5 mg total) rectally as needed for refractory nausea / vomiting.  . sertraline (ZOLOFT) 100 MG tablet Take 200 mg by mouth at bedtime.   Marland Kitchen spironolactone (ALDACTONE) 25 MG tablet Take 0.5 tablets (12.5 mg total) by mouth daily.  . ticagrelor ([name]) 90 MG TABS tablet Take 1 tablet (90 mg total) by mouth 2 (two) times daily.     Allergies:   Canagliflozin and Nsaids   Past Medical History:  Diagnosis Date  . Acid reflux   . Anxiety   . Arthritis   . 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 CAD (coronary artery disease) 11/13/2018   Late presentation anterior MI 12/19 >> LHC - dLM 25, mLAD 99, oOM2 100 (R-L collats),  irreg RCA, EF 25-35 >> PCI: POBA to mLAD  . Chronic systolic CHF (congestive heart failure) ([place]) 11/28/2018   Ischemic CM // late presentation ant MI 10/2018 tx with POBA to LAD (residual CAD with CTO of the OM2) // Echo 12/19:  No mural apical thrombus, septal, apical mid ant and inf HK; mild LVH, EF 30-35, mild MR // Echo 01/2019: EF 30-35, Gr 1 DD, diff HK, apical AK, mild MR   . CKD (chronic kidney disease), stage I   . Diabetes mellitus type 1 ([place])   . Dyspnea   . Former tobacco use   . Gastroparesis   . Hypercholesteremia   . Hypertension   . Myocardial infarction ([place]) 2019  . Osteomyelitis ([place])    a. s/p R forefoot amputation.  . Renal failure   . Sciatica     Past Surgical History:  Procedure Laterality Date  . AMPUTATION Right 11/03/2011   Procedure: AMPUTATION RAY;  Surgeon: Jamesetta So;  Location: [facility];  Service: General;  Laterality: Right;  Right fourth and fifth metatarsal   . APPENDECTOMY    . CARDIAC CATHETERIZATION  10/2018  . CESAREAN SECTION  2004 and 2007   x2  . CORONARY/GRAFT ACUTE MI REVASCULARIZATION N/A 11/11/2018   Procedure: CORONARY/GRAFT ACUTE MI REVASCULARIZATION;  Surgeon: Jettie Booze, MD;  Location: [place] [facility];  Service: Cardiovascular;  Laterality: N/A;  . FRACTURE SURGERY  2000  . INJECTION OF SILICONE OIL Right 123XX123   Procedure: Injection Of Silicone Oil;  Surgeon: Bernarda Caffey, MD;  Location: [place];  Service: Ophthalmology;  Laterality: Right;  . LEFT HEART CATH AND CORONARY ANGIOGRAPHY N/A 11/11/2018   Procedure: LEFT HEART CATH AND CORONARY ANGIOGRAPHY;  Surgeon: Jettie Booze, MD;  Location: [place] [facility];  Service: Cardiovascular;  Laterality: N/A;  . MEMBRANE PEEL Right 09/14/2019   Procedure: Antoine Primas;  Surgeon: Bernarda Caffey, MD;  Location: [place];  Service: Ophthalmology;  Laterality: Right;  . PARS PLANA VITRECTOMY Right 09/14/2019   Procedure: [place] Plana Vitrectomy With 25 Gauge;  Surgeon:  Bernarda Caffey, MD;  Location: [place];  Service: Ophthalmology;  Laterality: Right;  . PHOTOCOAGULATION WITH LASER Right 09/14/2019   Procedure: Photocoagulation With Laser;  Surgeon: Bernarda Caffey, MD;  Location: [place];  Service: Ophthalmology;  Laterality: Right;  . 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 TUBAL LIGATION       Social History:  The patient  reports that she quit smoking about 6 years ago. Her smoking use included cigarettes. She has a 5.00 pack-year smoking history. She has never used smokeless tobacco. She reports that she does not drink alcohol or use drugs.   Family History:  The patient's family history includes Alcoholism in her father; Anemia in her mother; Asthma in her mother; Diabetes in her father, paternal grandfather, and paternal grandmother; Heart attack in her paternal grandmother; Hypertension in her mother; Kidney disease in her mother; Lung cancer in her father.    ROS:  Please see the history of present illness. All other systems are reviewed and  Negative to the above problem except as noted.    PHYSICAL EXAM: VS:  BP 108/60   Pulse 84   Ht 5\' 10"  (1.778 m)   Wt 196 lb 12.8 oz (89.3 kg)   SpO2 99%   BMI 28.24 kg/m   GEN: Well nourished, well developed, in no acute distress  HEENT: normal  Neck: no JVD, carotid bruits Cardiac: RRR; no murmurs ,no LE edema  Respiratory:  clear to auscultation bilaterally GI: soft, nontender, nondistended, + BS  No hepatomegaly  MS: no deformity Moving all extremities   Skin: warm and dry, no rash Neuro:  Strength and sensation are intact Psych: euthymic mood, full affect   EKG:  EKG is not ordered today.   Lipid Panel    Component Value Date/Time   CHOL 189 09/01/2019 1137   TRIG 546 (H) 09/01/2019 1137   HDL 26 (L) 09/01/2019 1137    CHOLHDL 7.3 (H) 09/01/2019 1137   CHOLHDL 6.8 11/11/2018 1304   VLDL 33 11/11/2018 1304   LDLCALC 76 09/01/2019 1137   LDLDIRECT 98 02/10/2012 1230    Prior cardiac studies  The following studies were reviewed today:  Echo 11/13/18 Study Conclusions  - Left ventricle: No mural apical thrombus with definity. septal apical mid anterior and inferior hypokinesis preserved basal function The cavity size was moderately dilated. Wall thickness was increased in a pattern of mild LVH. Systolic function was moderately to severely reduced. The estimated ejection fraction was in the range of 30% to 35%. - Mitral valve: There was mild regurgitation. - Atrial septum: No defect or patent foramen ovale was identified.  LEFT HEART CATH AND CORONARY ANGIOGRAPHY  CORONARY/GRAFT ACUTE MI REVASCULARIZATION12/20/19   Conclusion   Ost 2nd Mrg to 2nd Mrg lesion is 100% stenosed. Likely chronic occlusion with right to left collaterals.  Mid LAD lesion is 99% stenosed. PTCA with 2.0 mm balloon performed.  Post intervention, there is a 25% residual stenosis.  Dist LM lesion is 25% stenosed.  There is severe left ventricular systolic dysfunction.  The left ventricular ejection fraction is 25-35% by visual estimate.  LV end diastolic pressure is mildly elevated.  There is no aortic valve stenosis.       Wt Readings from Last 3 Encounters:  04/01/20 196 lb 12.8 oz (89.3 kg)  03/08/20 198 lb (89.8 kg)  12/26/19 190 lb (86.2 kg)      ASSESSMENT AND PLAN:  1  CAD   Recent eval in hosp     I am not convinced of active angina  APpears more GI    Follow for now     2  Chronic systolic CHF   Pt's volume status is OK   She did not tolerate entresto in the past   I would keep on same meds   Get echo fo confirm LVEF  May set up to see Anne [name] (had been seen in pst for possible ICD   Had foot infection at time   This ahs healed. Check BMET   3  HL   Need to check lipids  today  4  HTN  BP is OK Keep on current regimen   Check BMET  5  DM   Follows with M Altheimer   Says she needs to make an appt  6  GI   Pt with gastroparesis  Significant   Keep on Reglan     Current medicines are reviewed at length with the patient today.  The patient does not have concerns regarding medicines.  Signed, Anne Carnes, MD  04/01/2020 3:03 PM    [facility] [place], [place], [place]  [address] Phone: [phone]; Fax: [phone]

## 2020-04-02 LAB — BASIC METABOLIC PANEL
BUN/Creatinine Ratio: 21 (ref 9–23)
BUN: 30 mg/dL — ABNORMAL HIGH (ref 6–20)
CO2: 21 mmol/L (ref 20–29)
Calcium: 9.2 mg/dL (ref 8.7–10.2)
Chloride: 100 mmol/L (ref 96–106)
Creatinine, Ser: 1.43 mg/dL — ABNORMAL HIGH (ref 0.57–1.00)
GFR calc Af Amer: 53 mL/min/{1.73_m2} — ABNORMAL LOW (ref >59)
GFR calc non Af Amer: 46 mL/min/{1.73_m2} — ABNORMAL LOW (ref >59)
Glucose: 357 mg/dL — ABNORMAL HIGH (ref 65–99)
Potassium: 4.4 mmol/L (ref 3.5–5.2)
Sodium: 137 mmol/L (ref 134–144)

## 2020-04-02 LAB — LIPID PANEL
Chol/HDL Ratio: 7.8 ratio — ABNORMAL HIGH (ref 0.0–4.4)
Cholesterol, Total: 218 mg/dL — ABNORMAL HIGH (ref 100–199)
HDL: 28 mg/dL — ABNORMAL LOW (ref >39)
LDL Chol Calc (NIH): 136 mg/dL — ABNORMAL HIGH (ref 0–99)
Triglycerides: 299 mg/dL — ABNORMAL HIGH (ref 0–149)
VLDL Cholesterol Cal: 54 mg/dL — ABNORMAL HIGH (ref 5–40)

## 2020-04-05 ENCOUNTER — Telehealth: Payer: Self-pay | Admitting: *Deleted

## 2020-04-05 DIAGNOSIS — I2510 Atherosclerotic heart disease of native coronary artery without angina pectoris: Secondary | ICD-10-CM

## 2020-04-05 MED ORDER — EZETIMIBE 10 MG PO TABS: 10.0000 mg | ORAL_TABLET | Freq: Every day | ORAL | 3 refills | Status: DC

## 2020-04-05 NOTE — Telephone Encounter (Signed)
Lab results reviewed with patient.  Patient has been having trouble over the last months keeping medicines down when her gastroparesis symptoms were so bad.  That is better managed and she is tolerating medicines much better.  She will still add Zetia to atorvastatin 80 mg to help get to goal of <70 for LDL.  Will recheck labs on 06/07/20.

## 2020-04-05 NOTE — Telephone Encounter (Signed)
-----   Message from Dorris Carnes V, MD sent at 04/02/2020  3:36 PM EDT ----- Kidney function is relatively stable Glucose high   Otherwise, electrolytes are OK LDL is high at 136   It was 76 7 months ago     Is she still taking atorvastatin?   I would add back, along with Zetia 10 mg if she is not F/ U lipid panel in 8 wks   Goal is LDL less than 70

## 2020-04-24 ENCOUNTER — Other Ambulatory Visit (HOSPITAL_COMMUNITY): Payer: Medicaid Other

## 2020-05-01 ENCOUNTER — Other Ambulatory Visit: Payer: Self-pay | Admitting: Internal Medicine

## 2020-05-20 ENCOUNTER — Other Ambulatory Visit (HOSPITAL_COMMUNITY): Payer: Medicaid Other

## 2020-05-23 ENCOUNTER — Telehealth (HOSPITAL_COMMUNITY): Payer: Self-pay | Admitting: Internal Medicine

## 2020-05-23 ENCOUNTER — Encounter (HOSPITAL_COMMUNITY): Payer: Self-pay | Admitting: Internal Medicine

## 2020-05-23 NOTE — Telephone Encounter (Signed)
Just an FYI. We have made several attempts to contact this patient including sending a letter to schedule or reschedule their echocardiogram. We will be removing the patient from the echo [place].   05/13/20 MAILED LETTER TO RESCHEDULE NO SHOWS/LBW  05/09/2020 Called to reschedule and [facility] @ 11:25/LBW  05/06/20 Called to reschedule NO SHOW echo and [facility] to schedule @ 8:51/LBW      Thank you

## 2020-06-04 ENCOUNTER — Other Ambulatory Visit: Payer: Self-pay

## 2020-06-04 ENCOUNTER — Other Ambulatory Visit: Payer: Medicaid Other

## 2020-06-04 ENCOUNTER — Other Ambulatory Visit (HOSPITAL_COMMUNITY): Payer: Medicaid Other

## 2020-06-04 DIAGNOSIS — I2510 Atherosclerotic heart disease of native coronary artery without angina pectoris: Secondary | ICD-10-CM

## 2020-06-04 LAB — LIPID PANEL
Chol/HDL Ratio: 4.4 ratio (ref 0.0–4.4)
Cholesterol, Total: 93 mg/dL — ABNORMAL LOW (ref 100–199)
HDL: 21 mg/dL — ABNORMAL LOW (ref >39)
LDL Chol Calc (NIH): 30 mg/dL (ref 0–99)
Triglycerides: 277 mg/dL — ABNORMAL HIGH (ref 0–149)
VLDL Cholesterol Cal: 42 mg/dL — ABNORMAL HIGH (ref 5–40)

## 2020-06-07 ENCOUNTER — Other Ambulatory Visit: Payer: Medicaid Other

## 2020-06-20 ENCOUNTER — Other Ambulatory Visit (HOSPITAL_COMMUNITY): Payer: Medicaid Other

## 2020-07-08 ENCOUNTER — Other Ambulatory Visit: Payer: Self-pay

## 2020-07-08 ENCOUNTER — Ambulatory Visit (HOSPITAL_COMMUNITY): Payer: Medicare Other | Attending: Internal Medicine

## 2020-07-08 DIAGNOSIS — I2510 Atherosclerotic heart disease of native coronary artery without angina pectoris: Secondary | ICD-10-CM | POA: Diagnosis present

## 2020-07-08 LAB — ECHOCARDIOGRAM COMPLETE: Area-P 1/2: 5.54 cm2

## 2020-07-08 MED ORDER — PERFLUTREN LIPID MICROSPHERE
1.0000 mL | INTRAVENOUS | Status: AC | PRN
  Administered 2020-07-08: 2 mL via INTRAVENOUS

## 2020-07-11 NOTE — Progress Notes (Addendum)
[facility] Note  07/12/2020     CHIEF COMPLAINT Patient presents for Retina Follow Up   HISTORY OF PRESENT ILLNESS: Anne [name] is a 39 y.o. female who presents to the clinic today for:   HPI    Retina Follow Up    Patient presents with  Diabetic Retinopathy.  In both eyes.  Severity is moderate.  Duration of 5 months.  Since onset it is gradually improving.  I, the attending physician,  performed the HPI with the patient and updated documentation appropriately.          Comments    Pt here for PDR OU f/u, pts last exam was March 19, pt feels like vision has been slowly improving since then, she is seeing floaters in her left eye only, she was using brim and Cosopt, but just ran out of her last refill, she denies eye pain, her last A1c was checked in May and was 9.0, her blood sugar was 147 this morning, pt states she was hospitalized in April for gastro problems, she states she has had all her medication put into a blister pack so she does not have to dose them out herself, she states she is feeling much better physically since doing so       Last edited by Bernarda Caffey, MD on 07/12/2020  1:43 PM. (History)    pt states she was hospitalized in April for gastroparesis, she states she lost a bunch of weight at that time, she states she has had all her medications put into blister packs so that she makes sure she takes all of them, she says she was inadvertently missing some doses bc of her eye sight, she states she is feeling much better physically and has gained about 15lbs  Referring physician: Burt Ek, [place],  [place] [address]  HISTORICAL INFORMATION:   Selected notes from the MEDICAL RECORD NUMBER Referred by Dr. Stevenson Clinch for concern of VH / TRD OS   CURRENT MEDICATIONS: Current Outpatient Medications (Ophthalmic Drugs)  Medication Sig  . brimonidine (ALPHAGAN) 0.2 % ophthalmic solution Place 1 drop into  the right eye 2 (two) times daily.  . dorzolamide-timolol (COSOPT) 22.3-6.8 MG/ML ophthalmic solution Place 1 drop into the right eye 2 (two) times daily.   No current facility-administered medications for this visit. (Ophthalmic Drugs)   Current Outpatient Medications (Other)  Medication Sig  . albuterol (VENTOLIN HFA) 108 (90 Base) MCG/ACT inhaler Inhale 2 puffs into the lungs every 6 (six) hours as needed for wheezing or shortness of breath.  Marland Kitchen amoxicillin (AMOXIL) 500 MG capsule Take 500 mg by mouth 3 (three) times daily.  Marland Kitchen aspirin (GNP ASPIRIN LOW DOSE) 81 MG EC tablet Take 1 tablet (81 mg total) by mouth daily with breakfast. Swallow whole.  Marland Kitchen atorvastatin (LIPITOR) 80 MG tablet Take 1 tablet (80 mg total) by mouth every evening.  Marland Kitchen BAQSIMI TWO PACK 3 MG/DOSE POWD Place 3 mg into the nose once as needed (for emergency low blood sugar levels).   Marland Kitchen BYDUREON BCISE 2 MG/0.85ML AUIJ Inject 2 mg into the skin every Thursday.  . carvedilol (COREG) 6.25 MG tablet Take 1.5 tablets (9.375 mg total) by mouth 2 (two) times daily.  Marland Kitchen ezetimibe (ZETIA) 10 MG tablet Take 1 tablet (10 mg total) by mouth daily.  . fenofibrate (TRICOR) 145 MG tablet Take 1 tablet (145 mg total) by mouth daily.  . furosemide (LASIX) 40  MG tablet Take 1 tablet (40 mg total) by mouth 2 (two) times daily.  . isosorbide mononitrate (IMDUR) 30 MG 24 hr tablet TAKE 1 TABLET BY MOUTH ONCE DAILY.  Marland Kitchen LANTUS SOLOSTAR 100 UNIT/ML Solostar Pen INNJECT 50 UNITS S.Q. ONCE DAILY AT 10 P.M. (Patient taking differently: Inject 50 Units into the skin at bedtime. )  . losartan (COZAAR) 25 MG tablet Take 1 tablet (25 mg total) by mouth every evening.  . metFORMIN (GLUCOPHAGE) 1000 MG tablet Take 1,000 mg by mouth 2 (two) times daily with a meal.  . metoCLOPramide (REGLAN) 5 MG tablet Take 1 tablet (5 mg total) by mouth 3 (three) times daily before meals.  . nitroGLYCERIN (NITROSTAT) 0.4 MG SL tablet Place 1 tablet (0.4 mg total) under the  tongue every 5 (five) minutes as needed.  Marland Kitchen NOVOLOG FLEXPEN 100 UNIT/ML FlexPen INJECT 12-18 UNITS S.Q. THREE TIMES DAILY WITH MEALS. (Patient taking differently: Inject 12-18 Units into the skin 3 (three) times daily with meals. )  . ondansetron (ZOFRAN ODT) 8 MG disintegrating tablet Take 0.5 tablets (4 mg total) by mouth in the morning, at noon, and at bedtime.  . pantoprazole (PROTONIX) 40 MG tablet Take 1 tablet (40 mg total) by mouth daily.  . potassium chloride (K-DUR) 10 MEQ tablet Take 1 tablet (10 mEq total) by mouth 2 (two) times daily.  . pregabalin (LYRICA) 75 MG capsule   . promethazine (PHENERGAN) 12.5 MG suppository Place 1 suppository (12.5 mg total) rectally as needed for refractory nausea / vomiting.  . sertraline (ZOLOFT) 100 MG tablet Take 200 mg by mouth at bedtime.   Marland Kitchen spironolactone (ALDACTONE) 25 MG tablet Take 0.5 tablets (12.5 mg total) by mouth daily.  Marland Kitchen sulfamethoxazole-trimethoprim (BACTRIM DS) 800-160 MG tablet Take 1 tablet by mouth 2 (two) times daily.  . ticagrelor ([name]) 90 MG TABS tablet Take 1 tablet (90 mg total) by mouth 2 (two) times daily.  . vitamin B-12 (CYANOCOBALAMIN) 1000 MCG tablet Take 1,000 mcg by mouth daily.  . Vitamin D, Ergocalciferol, (DRISDOL) 1.25 MG (50000 UNIT) CAPS capsule Take by mouth.   No current facility-administered medications for this visit. (Other)      REVIEW OF SYSTEMS: ROS    Positive for: Endocrine, Eyes, Psychiatric   Negative for: Constitutional, Gastrointestinal, Neurological, Skin, Genitourinary, Musculoskeletal, HENT, Cardiovascular, Respiratory, Allergic/Imm, Heme/Lymph   Last edited by Debbrah Alar, COT on 07/12/2020  1:10 PM. (History)       ALLERGIES Allergies  Allergen Reactions  . Canagliflozin Other (See Comments)    Vaginal yeast infections  . Nsaids Other (See Comments)    Yeast infection     PAST MEDICAL HISTORY Past Medical History:  Diagnosis Date  . Acid reflux   . Anxiety   .  Arthritis   . 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 CAD (coronary artery disease) 11/13/2018   Late presentation anterior MI 12/19 >> LHC - dLM 25, mLAD 99, oOM2 100 (R-L collats), irreg RCA, EF 25-35 >> PCI: POBA to mLAD  . Chronic systolic CHF (congestive heart failure) ([place]) 11/28/2018   Ischemic CM // late presentation ant MI 10/2018 tx with POBA to LAD (residual CAD with CTO of the OM2) // Echo 12/19:  No mural apical thrombus, septal, apical mid ant and inf HK; mild LVH, EF 30-35, mild MR // Echo 01/2019: EF 30-35, Gr 1 DD, diff HK, apical AK, mild MR   . CKD (chronic  kidney disease), stage I   . Diabetes mellitus type 1 ([place])   . Dyspnea   . Former tobacco use   . Gastroparesis   . Hypercholesteremia   . Hypertension   . Myocardial infarction ([place]) 2019  . Osteomyelitis ([place])    a. s/p R forefoot amputation.  . Renal failure   . Sciatica    Past Surgical History:  Procedure Laterality Date  . AMPUTATION Right 11/03/2011   Procedure: AMPUTATION RAY;  Surgeon: Jamesetta So;  Location: [facility];  Service: General;  Laterality: Right;  Right fourth and fifth metatarsal   . APPENDECTOMY    . CARDIAC CATHETERIZATION  10/2018  . CESAREAN SECTION  2004 and 2007   x2  . CORONARY/GRAFT ACUTE MI REVASCULARIZATION N/A 11/11/2018   Procedure: CORONARY/GRAFT ACUTE MI REVASCULARIZATION;  Surgeon: Jettie Booze, MD;  Location: [facility];  Service: Cardiovascular;  Laterality: N/A;  . FRACTURE SURGERY  2000  . INJECTION OF SILICONE OIL Right 62/22/9798   Procedure: Injection Of Silicone Oil;  Surgeon: Bernarda Caffey, MD;  Location: [facility];  Service: Ophthalmology;  Laterality: Right;  . LEFT HEART CATH AND CORONARY ANGIOGRAPHY N/A 11/11/2018   Procedure: LEFT HEART CATH AND CORONARY ANGIOGRAPHY;  Surgeon: Jettie Booze, MD;  Location: [facility];  Service: Cardiovascular;  Laterality: N/A;  . MEMBRANE PEEL Right 09/14/2019    Procedure: Antoine Primas;  Surgeon: Bernarda Caffey, MD;  Location: [facility];  Service: Ophthalmology;  Laterality: Right;  . PARS PLANA VITRECTOMY Right 09/14/2019   Procedure: [place] Vitrectomy With 25 Gauge;  Surgeon: Bernarda Caffey, MD;  Location: [facility];  Service: Ophthalmology;  Laterality: Right;  . PHOTOCOAGULATION WITH LASER Right 09/14/2019   Procedure: Photocoagulation With Laser;  Surgeon: Bernarda Caffey, MD;  Location: [facility];  Service: Ophthalmology;  Laterality: Right;  . 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   Procedure: REPAIR OF COMPLEX TRACTION RETINAL DETACHMENT;  Surgeon: Bernarda Caffey, MD;  Location: [facility];  Service: Ophthalmology;  Laterality: Right;  . TUBAL LIGATION      FAMILY HISTORY Family History  Problem Relation Age of Onset  . Diabetes Father   . Lung cancer Father   . Alcoholism Father   . Asthma Mother   . Kidney disease Mother   . Anemia Mother        hemolytic  . Hypertension Mother   . Heart attack Paternal Grandmother   . Diabetes Paternal Grandmother   . Diabetes Paternal Grandfather   . Anesthesia problems Neg Hx   . Hypotension Neg Hx   . Malignant hyperthermia Neg Hx   . Pseudochol deficiency Neg Hx     SOCIAL HISTORY Social History   Tobacco Use  . Smoking status: Former Smoker    Packs/day: 0.25    Years: 20.00    Pack years: 5.00    Types: Cigarettes    Quit date: 12/29/2013    Years since quitting: 6.5  . Smokeless tobacco: Never Used  Vaping Use  . Vaping Use: Never used  Substance Use Topics  . Alcohol use: No  . Drug use: No         OPHTHALMIC EXAM:  Base Eye Exam    Visual Acuity (Snellen - Linear)      Right Left   Dist [place] 20/150 -1 CF at face   Dist ph [place] 20/80 NI       Tonometry (Tonopen, 1:17 PM)  Right Left   Pressure 20 14       Pupils      Dark Light Shape React APD   Right 4 3 Round Brisk None   Left 4 3 Round Brisk None       Visual Fields      Left Right     Full    Restrictions Total superior temporal, inferior temporal, superior nasal, inferior nasal deficiencies        Extraocular Movement      Right Left    Full, Ortho Full, Ortho       Neuro/Psych    Oriented x3: Yes   Mood/Affect: Normal       Dilation    Both eyes: 1.0% Mydriacyl, 2.5% Phenylephrine @ 1:17 PM        Slit Lamp and Fundus Exam    Slit Lamp Exam      Right Left   Lids/Lashes Mild Meibomian gland dysfunction Dermatochalasis - upper lid, Meibomian gland dysfunction   Conjunctiva/Sclera White and quiet White and quiet   Cornea Trace Punctate epithelial erosions 2-3+ Punctate epithelial erosions   Anterior Chamber Deep and quiet Deep and quiet   Iris Round and dilated, No NVI Round and dilated, No NVI   Lens 1-2+ Nuclear sclerosis, 1-2+ Cortical cataract, 2-3+PSC 1-2+ Nuclear sclerosis, 1-2+ Cortical cataract   Vitreous post vitrectomy; good silicon oil fill (approx 90-95%) Vitreous syneresis       Fundus Exam      Right Left   Disc Pink and Sharp, mild tilt obscured by fibrosis   C/D Ratio 0.5 no view   Macula Flat under oil, fibrosis improved, shallow residual SRF inferiorly -- improving, pigment clumping temporal macula Closed wolf-jaw TRD with macular hole   Vessels Vascular attenuation, Tortuous, focal fibrosis superior to disc 4+ fibrotic NVD w/ TRD   Periphery Improvement in tractional fibrosis at 0100 above disc and along arcades, shallow SRF improving; scattered IRH and exudate, 360 PRP with good posterior fill in scattered PRP, almost total detachment -- extension of central/posterior TRD          IMAGING AND PROCEDURES  Imaging and Procedures for @TODAY @  OCT, Retina - OU - Both Eyes       Right Eye Quality was good. Central Foveal Thickness: 233. Progression has improved. Findings include abnormal foveal contour, subretinal fluid, epiretinal membrane, intraretinal hyper-reflective material, no IRF (stable improvement in TRD -- traction removed --  interval improvement in SRF inferior periphery).   Left Eye Quality was good. Progression has been stable. Findings include abnormal foveal contour, subretinal fluid, preretinal fibrosis, epiretinal membrane, vitreous traction.   Notes *Images captured and stored on drive  Diagnosis / Impression:  OD: stable improvement in TRD -- traction removed -- interval improvement in SRF inferior periphery OS: total macula-involving TRD  Clinical management:  See below  Abbreviations: NFP - Normal foveal profile. CME - cystoid macular edema. PED - pigment epithelial detachment. IRF - intraretinal fluid. SRF - subretinal fluid. EZ - ellipsoid zone. ERM - epiretinal membrane. ORA - outer retinal atrophy. ORT - outer retinal tubulation. SRHM - subretinal hyper-reflective material                 ASSESSMENT/PLAN:    ICD-10-CM   1. 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 Retinal edema  H35.81 OCT, Retina - OU - Both Eyes  3. Essential hypertension  I10   4. Hypertensive retinopathy of both eyes  H35.033   5. Combined forms of age-related cataract of both eyes  H25.813     1,2. 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)  - lost to f/u from 3.19.21 to 8.20.21 (5 mos) -- was been hospitalized due to gastroparesis  - came back due to running out of drops  - delayed follow up from 4 weeks to 11 weeks (from 12.11.20 to 3.2.21)  - formerly managed at [facility] -- s/p PRP OU -- last visit in 2017  - extensive history of severe disease and medical noncompliance  - exam showed and OCT confirmed TRD OU -- OD with inf macula and foveal involvement; OS with closed wolf-jaw total TRD with macular hole  - discussed findings and very poor prognosis given chronicity of problems  - S/P IVA #1 OD (10.16.20)  - s/p PPV/MP/EL/FAX/1000cs SO OD,  10.22.20  - BCVA 20/80 OD -- improved from 20/150             - fibrosis improved and retina flattening under oil  - OCT shows interval improvement in shallow SRF inferiorly -- improving slowly             - IOP okay today at 20, was elevated at 56 on 03.02.21 -- ran out of drops about 1 wk ago   - restart Cosopt BID OD -- refills sent  - restart Brimonidine BID OD -- refills sent  - f/u 3-4 months, sooner prn -- DFE, OCT   3,4. Hypertensive retinopathy OU  - discussed importance of tight BP control  - monitor  5. 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  - discussed diagnosis and progression  - OD w/ progressive PSC -- likely limiting vision  - will refer to [facility] for cat eval   Ophthalmic Meds Ordered this visit:  Meds ordered this encounter  Medications  . brimonidine (ALPHAGAN) 0.2 % ophthalmic solution    Sig: Place 1 drop into the right eye 2 (two) times daily.    Dispense:  15 mL    Refill:  4  . dorzolamide-timolol (COSOPT) 22.3-6.8 MG/ML ophthalmic solution    Sig: Place 1 drop into the right eye 2 (two) times daily.    Dispense:  10 mL    Refill:  4       Return for f/u 3-4 months, PDR OU, DFE, OCT.  There are no Patient Instructions on file for this visit.   Explained the diagnoses, plan, and follow up with the patient and they expressed understanding.  Patient expressed understanding of the importance of proper follow up care.   This document serves as a record of services personally performed by Gardiner Sleeper, MD, PhD. It was created on their behalf by San Jetty. Owens Shark, OA an ophthalmic technician. The creation of this record is the provider's dictation and/or activities during the visit.    Electronically signed by: San Jetty. Owens Shark, [place] 08.19.2021 10:05 PM  Gardiner Sleeper, M.D., Ph.D. Diseases & Surgery of the Retina and Vitreous [facility]  I have reviewed the above documentation for accuracy and completeness, and I agree with the above. Gardiner Sleeper, M.D., Ph.D. 07/14/20 10:05 PM  Abbreviations: M myopia (nearsighted); A astigmatism; H hyperopia (farsighted); P presbyopia; Mrx spectacle prescription;  CTL contact  lenses; OD right eye; OS left eye; OU both eyes  XT exotropia; ET esotropia; PEK punctate epithelial keratitis; PEE punctate epithelial erosions; DES dry eye syndrome; MGD meibomian gland dysfunction; ATs artificial tears; PFAT's preservative free artificial tears; [place] nuclear sclerotic cataract; PSC posterior subcapsular cataract; ERM epi-retinal membrane; PVD posterior vitreous detachment; RD retinal detachment; DM diabetes mellitus; DR diabetic retinopathy; NPDR non-proliferative diabetic retinopathy; PDR proliferative diabetic retinopathy; CSME clinically significant macular edema; DME diabetic macular edema; dbh dot blot hemorrhages; CWS cotton wool spot; POAG primary open angle glaucoma; C/D cup-to-disc ratio; HVF humphrey visual field; GVF goldmann visual field; OCT optical coherence tomography; IOP intraocular pressure; BRVO Branch retinal vein occlusion; CRVO central retinal vein occlusion; CRAO central retinal artery occlusion; BRAO branch retinal artery occlusion; RT retinal tear; SB scleral buckle; PPV pars plana vitrectomy; VH Vitreous hemorrhage; PRP panretinal laser photocoagulation; IVK intravitreal kenalog; VMT vitreomacular traction; MH Macular hole;  NVD neovascularization of the disc; NVE neovascularization elsewhere; AREDS age related eye disease study; ARMD age related macular degeneration; POAG primary open angle glaucoma; EBMD epithelial/anterior basement membrane dystrophy; ACIOL anterior chamber intraocular lens; IOL intraocular lens; PCIOL posterior chamber intraocular lens; Phaco/IOL phacoemulsification with intraocular lens placement; [place] photorefractive keratectomy; LASIK laser  assisted in situ keratomileusis; HTN hypertension; DM diabetes mellitus; COPD chronic obstructive pulmonary disease

## 2020-07-12 ENCOUNTER — Ambulatory Visit (INDEPENDENT_AMBULATORY_CARE_PROVIDER_SITE_OTHER): Payer: Medicare Other | Admitting: Ophthalmology

## 2020-07-12 ENCOUNTER — Other Ambulatory Visit: Payer: Self-pay

## 2020-07-12 ENCOUNTER — Encounter (INDEPENDENT_AMBULATORY_CARE_PROVIDER_SITE_OTHER): Payer: Self-pay | Admitting: Ophthalmology

## 2020-07-12 DIAGNOSIS — I10 Essential (primary) hypertension: Secondary | ICD-10-CM

## 2020-07-12 DIAGNOSIS — E113523 Type 2 diabetes mellitus with proliferative diabetic retinopathy with traction retinal detachment involving the macula, bilateral: Secondary | ICD-10-CM

## 2020-07-12 DIAGNOSIS — H3581 Retinal edema: Secondary | ICD-10-CM

## 2020-07-12 DIAGNOSIS — H35033 Hypertensive retinopathy, bilateral: Secondary | ICD-10-CM

## 2020-07-12 DIAGNOSIS — H25813 Combined forms of age-related cataract, bilateral: Secondary | ICD-10-CM

## 2020-07-12 MED ORDER — DORZOLAMIDE HCL-TIMOLOL MAL 2-0.5 % OP SOLN: 1.0000 [drp] | Freq: Two times a day (BID) | OPHTHALMIC | 4 refills | Status: DC

## 2020-07-12 MED ORDER — BRIMONIDINE TARTRATE 0.2 % OP SOLN: 1.0000 [drp] | Freq: Two times a day (BID) | OPHTHALMIC | 4 refills | Status: DC

## 2020-07-14 ENCOUNTER — Encounter (INDEPENDENT_AMBULATORY_CARE_PROVIDER_SITE_OTHER): Payer: Self-pay | Admitting: Ophthalmology

## 2020-08-30 ENCOUNTER — Other Ambulatory Visit: Payer: Self-pay | Admitting: *Deleted

## 2020-08-30 DIAGNOSIS — I5042 Chronic combined systolic (congestive) and diastolic (congestive) heart failure: Secondary | ICD-10-CM

## 2020-09-03 ENCOUNTER — Other Ambulatory Visit: Payer: Self-pay

## 2020-09-03 ENCOUNTER — Other Ambulatory Visit: Payer: Medicare Other | Admitting: *Deleted

## 2020-09-03 DIAGNOSIS — I5042 Chronic combined systolic (congestive) and diastolic (congestive) heart failure: Secondary | ICD-10-CM

## 2020-09-04 LAB — BASIC METABOLIC PANEL
BUN/Creatinine Ratio: 15 (ref 9–23)
BUN: 23 mg/dL (ref 6–24)
CO2: 23 mmol/L (ref 20–29)
Calcium: 9 mg/dL (ref 8.7–10.2)
Chloride: 97 mmol/L (ref 96–106)
Creatinine, Ser: 1.54 mg/dL — ABNORMAL HIGH (ref 0.57–1.00)
GFR calc Af Amer: 48 mL/min/{1.73_m2} — ABNORMAL LOW (ref >59)
GFR calc non Af Amer: 42 mL/min/{1.73_m2} — ABNORMAL LOW (ref >59)
Glucose: 361 mg/dL — ABNORMAL HIGH (ref 65–99)
Potassium: 4 mmol/L (ref 3.5–5.2)
Sodium: 137 mmol/L (ref 134–144)

## 2020-09-04 LAB — PRO B NATRIURETIC PEPTIDE: NT-Pro BNP: 293 pg/mL — ABNORMAL HIGH (ref 0–130)

## 2020-09-06 ENCOUNTER — Telehealth: Payer: Self-pay

## 2020-09-06 NOTE — Telephone Encounter (Signed)
-----   Message from Fay Records, MD sent at 09/04/2020  4:36 PM EDT ----- Glucose is very high 361   Kidney function is stable from May   Patient should have clnic appt in cardiology   Needs to reassess volume status

## 2020-09-06 NOTE — Telephone Encounter (Signed)
Attempted to call the pt re: her lab results but she is having eye surgery today.. I advised the family that we will call back early next week to speak with her about her labs and make her an appointment.

## 2020-09-23 ENCOUNTER — Ambulatory Visit: Payer: Medicaid Other | Admitting: Internal Medicine

## 2020-09-23 NOTE — Progress Notes (Deleted)
Cardiology Office Note   Date:  09/23/2020   ID:  Anne [name], DOB January 19, 1981, MRN [id number]  PCP:  Alfonse Flavors, MD  Cardiologist:   Dorris Carnes, MD    Pt presetns for f/u of CAD and CHF     History of Present Illness: Anne [name] is a 39 y.o. 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 She was admitted in 10/2018 with a late presentation anterior MI c/b post MI pericarditis. Cardiac Cath demonstrated diffuse dz in the LAD and LCx.  She underwent POBA to the LAD b/c it was not large enough for stenting or CABG. EF was 30-35. She was last seen in 11/2018 for follow up.  I changed her Entresto back to Losartan due to low BP.    I saw the pt in May 2021   No outpatient medications have been marked as taking for the 09/23/20 encounter (Appointment) with Fay Records, MD.     Allergies:   Canagliflozin and Nsaids   Past Medical History:  Diagnosis Date  . Acid reflux   . Anxiety   . Arthritis   . 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 CAD (coronary artery disease) 11/13/2018   Late presentation anterior MI 12/19 >> LHC - dLM 25, mLAD 99, oOM2 100 (R-L collats), irreg RCA, EF 25-35 >> PCI: POBA to mLAD  . Chronic systolic CHF (congestive heart failure) ([place]) 11/28/2018   Ischemic CM // late presentation ant MI 10/2018 tx with POBA to LAD (residual CAD with CTO of the OM2) // Echo 12/19:  No mural apical thrombus, septal, apical mid ant and inf HK; mild LVH, EF 30-35, mild MR // Echo 01/2019: EF 30-35, Gr 1 DD, diff HK, apical AK, mild MR   . CKD (chronic kidney disease), stage I   . Diabetes mellitus type 1 ([place])   . Dyspnea   . Former tobacco use   . Gastroparesis   . Hypercholesteremia   . Hypertension   . Myocardial infarction ([place]) 2019  . Osteomyelitis ([place])    a.  s/p R forefoot amputation.  . Renal failure   . Sciatica     Past Surgical History:  Procedure Laterality Date  . AMPUTATION Right 11/03/2011   Procedure: AMPUTATION RAY;  Surgeon: Jamesetta So;  [facility]: [facility];  Service: General;  Laterality: Right;  Right fourth and fifth metatarsal   . APPENDECTOMY    . CARDIAC CATHETERIZATION  10/2018  . CESAREAN SECTION  2004 and 2007   x2  . CORONARY/GRAFT ACUTE MI REVASCULARIZATION N/A 11/11/2018   Procedure: CORONARY/GRAFT ACUTE MI REVASCULARIZATION;  Surgeon: Jettie Booze, MD;  Location: [facility];  Service: Cardiovascular;  Laterality: N/A;  . FRACTURE SURGERY  2000  . INJECTION OF SILICONE OIL Right 85/27/7824   Procedure: Injection Of Silicone Oil;  Surgeon: Bernarda Caffey, MD;  Location: [facility];  Service: Ophthalmology;  Laterality: Right;  . LEFT HEART CATH AND CORONARY ANGIOGRAPHY N/A 11/11/2018   Procedure: LEFT HEART CATH AND CORONARY ANGIOGRAPHY;  Surgeon: Jettie Booze, MD;  Location: [facility];  Service: Cardiovascular;  Laterality: N/A;  . MEMBRANE PEEL Right 09/14/2019   Procedure: Antoine Primas;  Surgeon: Bernarda Caffey, MD;  Location: [facility];  Service: Ophthalmology;  Laterality: Right;  . PARS PLANA VITRECTOMY Right 09/14/2019  Procedure: Pars Plana Vitrectomy With 25 Gauge;  Surgeon: Bernarda Caffey, MD;  Location: [facility];  Service: Ophthalmology;  Laterality: Right;  . PHOTOCOAGULATION WITH LASER Right 09/14/2019   Procedure: Photocoagulation With Laser;  Surgeon: Bernarda Caffey, MD;  Location: [facility];  Service: Ophthalmology;  Laterality: Right;  . 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   Procedure: REPAIR OF COMPLEX TRACTION RETINAL DETACHMENT;  Surgeon: Bernarda Caffey, MD;  Location: [facility];  Service: Ophthalmology;  Laterality: Right;  . TUBAL LIGATION       Social History:  The patient  reports that she quit smoking about 6 years ago. Her smoking use included  cigarettes. She has a 5.00 pack-year smoking history. She has never used smokeless tobacco. She reports that she does not drink alcohol and does not use drugs.   Family History:  The patient's family history includes Alcoholism in her father; Anemia in her mother; Asthma in her mother; Diabetes in her father, paternal grandfather, and paternal grandmother; Heart attack in her paternal grandmother; Hypertension in her mother; Kidney disease in her mother; Lung cancer in her father.    ROS:  Please see the history of present illness. All other systems are reviewed and  Negative to the above problem except as noted.    PHYSICAL EXAM: VS:  There were no vitals taken for this visit.  GEN: Overweight 39 yo in no acute distress  HEENT: normal  Neck: no JVD, carotid bruits, or masses Cardiac: RRR; no murmurs, rubs, or gallops,no edema  Respiratory:  clear to auscultation bilaterally, normal work of breathing GI: soft, nontender, nondistended, + BS  No hepatomegaly  MS: no deformity Moving all extremities   Skin: warm and dry, no rash Neuro:  Strength and sensation are intact Psych: euthymic mood, full affect   EKG:  EKG is ordered today.  Cardiac studies  Echo 11/13/18 Study Conclusions  - Left ventricle: No mural apical thrombus with definity. septal apical mid anterior and inferior hypokinesis preserved basal function The cavity size was moderately dilated. Wall thickness was increased in a pattern of mild LVH. Systolic function was moderately to severely reduced. The estimated ejection fraction was in the range of 30% to 35%. - Mitral valve: There was mild regurgitation. - Atrial septum: No defect or patent foramen ovale was identified.  LEFT HEART CATH AND CORONARY ANGIOGRAPHY  CORONARY/GRAFT ACUTE MI REVASCULARIZATION12/20/19   Conclusion   Ost 2nd Mrg to 2nd Mrg lesion is 100% stenosed. Likely chronic occlusion with right to left collaterals.  Mid LAD lesion  is 99% stenosed. PTCA with 2.0 mm balloon performed.  Post intervention, there is a 25% residual stenosis.  Dist LM lesion is 25% stenosed.  There is severe left ventricular systolic dysfunction.  The left ventricular ejection fraction is 25-35% by visual estimate.  LV end diastolic pressure is mildly elevated.  There is no aortic valve stenosis.      Lipid Panel    Component Value Date/Time   CHOL 93 (L) 06/04/2020 0923   TRIG 277 (H) 06/04/2020 0923   HDL 21 (L) 06/04/2020 0923   CHOLHDL 4.4 06/04/2020 0923   CHOLHDL 6.8 11/11/2018 1304   VLDL 33 11/11/2018 1304   LDLCALC 30 06/04/2020 0923   LDLDIRECT 98 02/10/2012 1230      Wt Readings from Last 3 Encounters:  04/01/20 196 lb 12.8 oz (89.3 kg)  03/08/20 198 lb (89.8 kg)  12/26/19 190 lb (86.2 kg)      ASSESSMENT AND PLAN:  1  CAD   No symptoms of angina      2  CHF  Echo LVEF 30 to 35%   VOlume status is OK   Keep on current regimen  Check labs today   3  HL   WIll check lipids    4  HTN   BP is OK on current regimen    Current medicines are reviewed at length with the patient today.  The patient does not have concerns regarding medicines.  Signed, Dorris Carnes, MD  09/23/2020 7:01 AM    [address], [place], [place]  [address] Phone: [phone]; Fax: [phone]

## 2020-10-14 ENCOUNTER — Encounter (INDEPENDENT_AMBULATORY_CARE_PROVIDER_SITE_OTHER): Payer: Self-pay

## 2020-10-14 ENCOUNTER — Encounter (INDEPENDENT_AMBULATORY_CARE_PROVIDER_SITE_OTHER): Payer: Medicaid Other | Admitting: Ophthalmology

## 2020-10-14 DIAGNOSIS — H35033 Hypertensive retinopathy, bilateral: Secondary | ICD-10-CM

## 2020-10-14 DIAGNOSIS — I10 Essential (primary) hypertension: Secondary | ICD-10-CM

## 2020-10-14 DIAGNOSIS — H25813 Combined forms of age-related cataract, bilateral: Secondary | ICD-10-CM

## 2020-10-14 DIAGNOSIS — E113523 Type 2 diabetes mellitus with proliferative diabetic retinopathy with traction retinal detachment involving the macula, bilateral: Secondary | ICD-10-CM

## 2020-10-14 DIAGNOSIS — H3581 Retinal edema: Secondary | ICD-10-CM

## 2021-01-22 ENCOUNTER — Other Ambulatory Visit: Payer: Self-pay | Admitting: Nephrology

## 2021-01-22 DIAGNOSIS — N1832 Chronic kidney disease, stage 3b: Secondary | ICD-10-CM

## 2021-01-29 ENCOUNTER — Ambulatory Visit
Admission: RE | Admit: 2021-01-29 | Discharge: 2021-01-29 | Disposition: A | Discharge status: Home / Self Care | Payer: Medicare Other | Source: Ambulatory Visit | Attending: Nephrology | Admitting: Nephrology

## 2021-01-29 DIAGNOSIS — N1832 Chronic kidney disease, stage 3b: Secondary | ICD-10-CM

## 2021-01-30 ENCOUNTER — Telehealth: Payer: Self-pay | Admitting: Internal Medicine

## 2021-01-30 NOTE — Telephone Encounter (Signed)
Pt c/o medication issue:  1. Name of Medication:   spironolactone (ALDACTONE) 25 MG tablet potassium chloride (K-DUR) 10 MEQ tablet  2. How are you currently taking this medication (dosage and times per day)? Patient is not currently taking these medications   3. Are you having a reaction (difficulty breathing--STAT)? No   4. What is your medication issue?   Patient states her nephrologist with [facility] advised that she stop taking these medications. She states she has not been taking them and she would like to make Dr. Harrington Challenger aware of this. She requested a call back to confirm that this change is alright.

## 2021-01-30 NOTE — Telephone Encounter (Signed)
Patient reports she had renal ultrasound yesterday and she was told to stop potassium and spironolactone.  There were no new labs drawn.  Pt thinks it was based on the lab work last drawn in Oct at our office.  She wanted to make sure ok w Dr. Harrington Challenger. I have removed the potassium and spironolactone 12.5 mg from her med list.  She reports BPs on low side much of the time.  Often systolic does not go above 110 and often 90 or less.  She feels weak and fatigued when it is low.  Last night 80/.  She drank fluids and elevated her legs.   This happens mostly in the evening time.  She is aware I will ask Dr. Harrington Challenger to review and if there are other medicine changes we will call her back.  Scheduled her in office in May.

## 2021-01-30 NOTE — Telephone Encounter (Signed)
Note of BP being low    Would cut back on carvedilol dose   She is on 1.5 tabs 2x per day   Would cut back to 0.5 tabs bid

## 2021-01-31 MED ORDER — CARVEDILOL 3.125 MG PO TABS: 3.1250 mg | ORAL_TABLET | Freq: Two times a day (BID) | ORAL | 3 refills | Status: DC

## 2021-01-31 NOTE — Telephone Encounter (Signed)
Spoke with patient and provided med change recommendation to decrease carvedilol to 3.125 mg twice daily.  She will monitor her HR as it runs usually 80s-90s right now.  Will monitor BP.

## 2021-02-12 ENCOUNTER — Emergency Department (HOSPITAL_COMMUNITY): Payer: Medicare Other

## 2021-02-12 ENCOUNTER — Emergency Department (HOSPITAL_COMMUNITY)
Admission: EM | Admit: 2021-02-12 | Discharge: 2021-02-12 | Disposition: A | Discharge status: Home / Self Care | Payer: Medicare Other | Attending: Emergency Medicine | Admitting: Emergency Medicine

## 2021-02-12 ENCOUNTER — Other Ambulatory Visit: Payer: Self-pay

## 2021-02-12 ENCOUNTER — Encounter (HOSPITAL_COMMUNITY): Payer: Self-pay

## 2021-02-12 DIAGNOSIS — K047 Periapical abscess without sinus: Secondary | ICD-10-CM | POA: Diagnosis not present

## 2021-02-12 DIAGNOSIS — E1165 Type 2 diabetes mellitus with hyperglycemia: Secondary | ICD-10-CM | POA: Diagnosis not present

## 2021-02-12 DIAGNOSIS — I2510 Atherosclerotic heart disease of native coronary artery without angina pectoris: Secondary | ICD-10-CM | POA: Diagnosis not present

## 2021-02-12 DIAGNOSIS — I130 Hypertensive heart and chronic kidney disease with heart failure and stage 1 through stage 4 chronic kidney disease, or unspecified chronic kidney disease: Secondary | ICD-10-CM | POA: Insufficient documentation

## 2021-02-12 DIAGNOSIS — Z79899 Other long term (current) drug therapy: Secondary | ICD-10-CM | POA: Insufficient documentation

## 2021-02-12 DIAGNOSIS — Z20822 Contact with and (suspected) exposure to covid-19: Secondary | ICD-10-CM | POA: Insufficient documentation

## 2021-02-12 DIAGNOSIS — Z89431 Acquired absence of right foot: Secondary | ICD-10-CM | POA: Insufficient documentation

## 2021-02-12 DIAGNOSIS — E1122 Type 2 diabetes mellitus with diabetic chronic kidney disease: Secondary | ICD-10-CM | POA: Insufficient documentation

## 2021-02-12 DIAGNOSIS — I5022 Chronic systolic (congestive) heart failure: Secondary | ICD-10-CM | POA: Diagnosis not present

## 2021-02-12 DIAGNOSIS — Z9861 Coronary angioplasty status: Secondary | ICD-10-CM | POA: Diagnosis not present

## 2021-02-12 DIAGNOSIS — D72829 Elevated white blood cell count, unspecified: Secondary | ICD-10-CM | POA: Diagnosis not present

## 2021-02-12 DIAGNOSIS — D649 Anemia, unspecified: Secondary | ICD-10-CM | POA: Insufficient documentation

## 2021-02-12 DIAGNOSIS — Z7982 Long term (current) use of aspirin: Secondary | ICD-10-CM | POA: Diagnosis not present

## 2021-02-12 DIAGNOSIS — Z87891 Personal history of nicotine dependence: Secondary | ICD-10-CM | POA: Diagnosis not present

## 2021-02-12 DIAGNOSIS — Z794 Long term (current) use of insulin: Secondary | ICD-10-CM | POA: Diagnosis not present

## 2021-02-12 DIAGNOSIS — E1140 Type 2 diabetes mellitus with diabetic neuropathy, unspecified: Secondary | ICD-10-CM | POA: Insufficient documentation

## 2021-02-12 DIAGNOSIS — Z7984 Long term (current) use of oral hypoglycemic drugs: Secondary | ICD-10-CM | POA: Insufficient documentation

## 2021-02-12 DIAGNOSIS — R220 Localized swelling, mass and lump, head: Secondary | ICD-10-CM | POA: Diagnosis present

## 2021-02-12 DIAGNOSIS — N181 Chronic kidney disease, stage 1: Secondary | ICD-10-CM | POA: Diagnosis not present

## 2021-02-12 LAB — COMPREHENSIVE METABOLIC PANEL
ALT: 17 U/L (ref 0–44)
AST: 14 U/L — ABNORMAL LOW (ref 15–41)
Albumin: 3.9 g/dL (ref 3.5–5.0)
Alkaline Phosphatase: 38 U/L (ref 38–126)
Anion gap: 12 (ref 5–15)
BUN: 19 mg/dL (ref 6–20)
CO2: 24 mmol/L (ref 22–32)
Calcium: 8.8 mg/dL — ABNORMAL LOW (ref 8.9–10.3)
Chloride: 99 mmol/L (ref 98–111)
Creatinine, Ser: 1.4 mg/dL — ABNORMAL HIGH (ref 0.44–1.00)
GFR, Estimated: 49 mL/min — ABNORMAL LOW (ref >60)
Glucose, Bld: 334 mg/dL — ABNORMAL HIGH (ref 70–99)
Potassium: 3.9 mmol/L (ref 3.5–5.1)
Sodium: 135 mmol/L (ref 135–145)
Total Bilirubin: 0.5 mg/dL (ref 0.3–1.2)
Total Protein: 7.5 g/dL (ref 6.5–8.1)

## 2021-02-12 LAB — CBC WITH DIFFERENTIAL/PLATELET
Abs Immature Granulocytes: 0.04 10*3/uL (ref 0.00–0.07)
Basophils Absolute: 0.1 10*3/uL (ref 0.0–0.1)
Basophils Relative: 0 %
Eosinophils Absolute: 0.2 10*3/uL (ref 0.0–0.5)
Eosinophils Relative: 1 %
HCT: 35.2 % — ABNORMAL LOW (ref 36.0–46.0)
Hemoglobin: 11.3 g/dL — ABNORMAL LOW (ref 12.0–15.0)
Immature Granulocytes: 0 %
Lymphocytes Relative: 9 %
Lymphs Abs: 1 10*3/uL (ref 0.7–4.0)
MCH: 27.9 pg (ref 26.0–34.0)
MCHC: 32.1 g/dL (ref 30.0–36.0)
MCV: 86.9 fL (ref 80.0–100.0)
Monocytes Absolute: 0.6 10*3/uL (ref 0.1–1.0)
Monocytes Relative: 5 %
Neutro Abs: 9.5 10*3/uL — ABNORMAL HIGH (ref 1.7–7.7)
Neutrophils Relative %: 85 %
Platelets: 264 10*3/uL (ref 150–400)
RBC: 4.05 MIL/uL (ref 3.87–5.11)
RDW: 13.2 % (ref 11.5–15.5)
WBC: 11.4 10*3/uL — ABNORMAL HIGH (ref 4.0–10.5)
nRBC: 0 % (ref 0.0–0.2)

## 2021-02-12 LAB — URINALYSIS, ROUTINE W REFLEX MICROSCOPIC
Bilirubin Urine: NEGATIVE
Glucose, UA: >=500 mg/dL — AB
Hgb urine dipstick: NEGATIVE
Ketones, ur: NEGATIVE mg/dL
Leukocytes,Ua: NEGATIVE
Nitrite: NEGATIVE
Protein, ur: NEGATIVE mg/dL
Specific Gravity, Urine: 1.013 (ref 1.005–1.030)
pH: 7 (ref 5.0–8.0)

## 2021-02-12 LAB — SARS CORONAVIRUS 2 (TAT 6-24 HRS): SARS Coronavirus 2: NEGATIVE

## 2021-02-12 LAB — LIPASE, BLOOD: Lipase: 32 U/L (ref 11–51)

## 2021-02-12 MED ORDER — IOHEXOL 300 MG/ML  SOLN
75.0000 mL | Freq: Once | INTRAMUSCULAR | Status: AC | PRN
  Administered 2021-02-12: 75 mL via INTRAVENOUS

## 2021-02-12 MED ORDER — CLINDAMYCIN PHOSPHATE 600 MG/50ML IV SOLN
600.0000 mg | Freq: Once | INTRAVENOUS | Status: AC
  Administered 2021-02-12: 600 mg via INTRAVENOUS
  Filled 2021-02-12: qty 50

## 2021-02-12 MED ORDER — CLINDAMYCIN HCL 150 MG PO CAPS: 300.0000 mg | ORAL_CAPSULE | Freq: Three times a day (TID) | ORAL | 0 refills | Status: DC

## 2021-02-12 MED ORDER — OXYCODONE HCL 5 MG PO TABS: 2.5000 mg | ORAL_TABLET | Freq: Four times a day (QID) | ORAL | 0 refills | Status: DC | PRN

## 2021-02-12 MED ORDER — SODIUM CHLORIDE 0.9 % IV BOLUS
1000.0000 mL | Freq: Once | INTRAVENOUS | Status: AC
  Administered 2021-02-12: 1000 mL via INTRAVENOUS

## 2021-02-12 MED ORDER — ONDANSETRON HCL 4 MG/2ML IJ SOLN
4.0000 mg | Freq: Once | INTRAMUSCULAR | Status: AC
  Administered 2021-02-12: 4 mg via INTRAVENOUS
  Filled 2021-02-12: qty 2

## 2021-02-12 MED ORDER — ONDANSETRON HCL 4 MG PO TABS: 4.0000 mg | ORAL_TABLET | Freq: Three times a day (TID) | ORAL | 0 refills | Status: DC | PRN

## 2021-02-12 MED ORDER — MORPHINE SULFATE (PF) 4 MG/ML IV SOLN
4.0000 mg | Freq: Once | INTRAVENOUS | Status: AC
  Administered 2021-02-12: 4 mg via INTRAVENOUS
  Filled 2021-02-12: qty 1

## 2021-02-12 MED ORDER — FENTANYL CITRATE (PF) 100 MCG/2ML IJ SOLN
50.0000 ug | Freq: Once | INTRAMUSCULAR | Status: AC
  Administered 2021-02-12: 50 ug via INTRAVENOUS
  Filled 2021-02-12: qty 2

## 2021-02-12 NOTE — Discharge Instructions (Signed)
Your CT showed a dental abscess. Please take your medications as prescribed.   SEEK MEDICAL ATTENTION IF: The exam and treatment you received today has been provided on an emergency basis only. This is not a substitute for complete medical or dental care. If your problem worsens or new symptoms (problems) appear, and you are unable to arrange prompt follow-up care with your dentist, call or return to this location. CALL YOUR DENTIST OR RETURN IMMEDIATELY IF you develop a fever, rash, difficulty breathing or swallowing, neck or facial swelling, or other potentially serious concerns.

## 2021-02-12 NOTE — ED Provider Notes (Signed)
[facility] Provider Note   CSN: [id number] Arrival date & time: 02/12/21  1346     History Chief Complaint  Patient presents with  . Fever    nausea    Anne [name] is a 40 y.o. 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 She is s/p forefoot amputation who presents with L facial swelling, fever and nausea. She has poor dentition and has had multiple extractions.  She has been unable to follow-up with her dentist after extraction secondary to the pandemic.  She has has severe dental pain on the left side associated with the facial swelling.  Patient has been taking extra strength Tylenol to reduce fever and symptoms.  Pain is 10 out of 10.  Pain does not radiate and is localized to the area of complaint.   HPI     Past Medical History:  Diagnosis Date  . Acid reflux   . Anxiety   . Arthritis   . 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 CAD (coronary artery disease) 11/13/2018   Late presentation anterior MI 12/19 >> LHC - dLM 25, mLAD 99, oOM2 100 (R-L collats), irreg RCA, EF 25-35 >> PCI: POBA to mLAD  . Chronic systolic CHF (congestive heart failure) ([place]) 11/28/2018   Ischemic CM // late presentation ant MI 10/2018 tx with POBA to LAD (residual CAD with CTO of the OM2) // Echo 12/19:  No mural apical thrombus, septal, apical mid ant and inf HK; mild LVH, EF 30-35, mild MR // Echo 01/2019: EF 30-35, Gr 1 DD, diff HK, apical AK, mild MR   . CKD (chronic kidney disease), stage I   . Diabetes  mellitus type 1 ([place])   . Dyspnea   . Former tobacco use   . Gastroparesis   . Hypercholesteremia   . Hypertension   . Myocardial infarction ([place]) 2019  . Osteomyelitis ([place])    a. s/p R forefoot amputation.  . Renal failure   . Sciatica     Patient Active Problem List   Diagnosis Date Noted  . Intractable nausea and vomiting 03/09/2020  . ACS (acute coronary syndrome) ([place]) 03/08/2020  . Hypercholesteremia   . Chest pain 05/03/2019  . Chronic systolic CHF (congestive heart failure) ([place]) 11/28/2018  . S/P PTCA (percutaneous transluminal coronary angioplasty)   . CAD (coronary artery disease) 11/13/2018  . 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[place])  11/13/2018  . 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 11/11/2018  . Osteomyelitis ([place]) 07/01/2018  . Personal history of noncompliance with medical treatment, presenting hazards to health 10/28/2015  . Multiple nevi 10/07/2012  . Toe ulcer ([place]) 03/11/2012  . Essential hypertension 03/11/2012  . Type 2 diabetes mellitus with vascular disease ([place]) 02/11/2012  . Diabetic neuropathy ([place]) 02/11/2012  . Back pain 02/11/2012  . Muscle spasm 02/11/2012  . Hyperlipidemia associated with type 2 diabetes mellitus ([place]) 02/11/2012  . Obesity 02/11/2012  . Cellulitis in diabetic foot ([place]) 10/31/2011    Past Surgical History:  Procedure Laterality Date  . AMPUTATION  Right 11/03/2011   Procedure: AMPUTATION RAY;  Surgeon: Jamesetta So;  Location: [facility];  Service: General;  Laterality: Right;  Right fourth and fifth metatarsal   . APPENDECTOMY    . CARDIAC CATHETERIZATION  10/2018  . CESAREAN SECTION  2004 and 2007   x2  . CORONARY/GRAFT ACUTE MI REVASCULARIZATION N/A 11/11/2018   Procedure: CORONARY/GRAFT ACUTE MI REVASCULARIZATION;  Surgeon: Jettie Booze, MD;  Location: [facility];  Service: Cardiovascular;  Laterality: N/A;  . FRACTURE SURGERY  2000  .  INJECTION OF SILICONE OIL Right 56/38/9373   Procedure: Injection Of Silicone Oil;  Surgeon: Bernarda Caffey, MD;  Location: [facility];  Service: Ophthalmology;  Laterality: Right;  . LEFT HEART CATH AND CORONARY ANGIOGRAPHY N/A 11/11/2018   Procedure: LEFT HEART CATH AND CORONARY ANGIOGRAPHY;  Surgeon: Jettie Booze, MD;  Location: [facility];  Service: Cardiovascular;  Laterality: N/A;  . MEMBRANE PEEL Right 09/14/2019   Procedure: Antoine Primas;  Surgeon: Bernarda Caffey, MD;  Location: [facility];  Service: Ophthalmology;  Laterality: Right;  . PARS PLANA VITRECTOMY Right 09/14/2019   Procedure: Pars Plana Vitrectomy With 25 Gauge;  Surgeon: Bernarda Caffey, MD;  Location: [facility];  Service: Ophthalmology;  Laterality: Right;  . PHOTOCOAGULATION WITH LASER Right 09/14/2019   Procedure: Photocoagulation With Laser;  Surgeon: Bernarda Caffey, MD;  Location: [facility];  Service: Ophthalmology;  Laterality: Right;  . 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   Procedure: REPAIR OF COMPLEX TRACTION RETINAL DETACHMENT;  Surgeon: Bernarda Caffey, MD;  Location: [facility];  Service: Ophthalmology;  Laterality: Right;  . TUBAL LIGATION       OB History   No obstetric history on file.     Family History  Problem Relation Age of Onset  . Diabetes Father   . Lung cancer Father   . Alcoholism Father   . Asthma Mother   . Kidney disease Mother   . Anemia Mother        hemolytic  . Hypertension Mother   . Heart attack Paternal Grandmother   . Diabetes Paternal Grandmother   . Diabetes Paternal Grandfather   . Anesthesia problems Neg Hx   . Hypotension Neg Hx   . Malignant hyperthermia Neg Hx   . Pseudochol deficiency Neg Hx     Social History   Tobacco Use  . Smoking status: Former Smoker    Packs/day: 0.25    Years: 20.00    Pack years: 5.00    Types: Cigarettes    Quit date: 12/29/2013    Years since quitting: 7.1  . Smokeless tobacco: Never Used  Vaping Use  . Vaping  Use: Never used  Substance Use Topics  . Alcohol use: No  . Drug use: No    Home Medications Prior to Admission medications   Medication Sig Start Date End Date Taking? Authorizing Provider  albuterol (VENTOLIN HFA) 108 (90 Base) MCG/ACT inhaler Inhale 2 puffs into the lungs every 6 (six) hours as needed for wheezing or shortness of breath.    [provider]  amoxicillin (AMOXIL) 500 MG capsule Take 500 mg by mouth 3 (three) times daily. 07/10/20   [provider]  aspirin (GNP ASPIRIN LOW DOSE) 81 MG EC tablet Take 1 tablet (81 mg total) by mouth daily with breakfast. Swallow whole. 05/04/19   Roxan Hockey, MD  atorvastatin (LIPITOR) 80 MG tablet Take 1 tablet (80 mg total) by mouth every evening. 05/04/19   Emokpae,  Courage, MD  BAQSIMI TWO PACK 3 MG/DOSE POWD Place 3 mg into the nose once as needed (for emergency low blood sugar levels).  01/24/20   [provider]  brimonidine (ALPHAGAN) 0.2 % ophthalmic solution Place 1 drop into the right eye 2 (two) times daily. 07/12/20   Bernarda Caffey, MD  BYDUREON BCISE 2 MG/0.85ML AUIJ Inject 2 mg into the skin every Thursday. 12/26/19   [provider]  carvedilol (COREG) 3.125 MG tablet Take 1 tablet (3.125 mg total) by mouth 2 (two) times daily. 01/31/21 05/01/21  Fay Records, MD  dorzolamide-timolol (COSOPT) 22.3-6.8 MG/ML ophthalmic solution Place 1 drop into the right eye 2 (two) times daily. 07/12/20   Bernarda Caffey, MD  ezetimibe (ZETIA) 10 MG tablet Take 1 tablet (10 mg total) by mouth daily. 04/05/20   Fay Records, MD  fenofibrate (TRICOR) 145 MG tablet Take 1 tablet (145 mg total) by mouth daily. 12/26/19   Daune Perch, NP  furosemide (LASIX) 40 MG tablet Take 1 tablet (40 mg total) by mouth 2 (two) times daily. 05/04/19 03/08/20  Roxan Hockey, MD  isosorbide mononitrate (IMDUR) 30 MG 24 hr tablet TAKE 1 TABLET BY MOUTH ONCE DAILY. 05/02/20   Fay Records, MD  LANTUS SOLOSTAR 100 UNIT/ML Solostar Pen  INNJECT 50 UNITS S.Q. ONCE DAILY AT 10 P.M. Patient taking differently: Inject 50 Units into the skin at bedtime.  02/03/16   Cassandria Anger, MD  losartan (COZAAR) 25 MG tablet Take 1 tablet (25 mg total) by mouth every evening. 05/04/19   Denton Brick, Courage, MD  metFORMIN (GLUCOPHAGE) 1000 MG tablet Take 1,000 mg by mouth 2 (two) times daily with a meal.    [provider]  metoCLOPramide (REGLAN) 5 MG tablet Take 1 tablet (5 mg total) by mouth 3 (three) times daily before meals. 03/12/20   Barton Dubois, MD  nitroGLYCERIN (NITROSTAT) 0.4 MG SL tablet Place 1 tablet (0.4 mg total) under the tongue every 5 (five) minutes as needed. 05/04/19   Emokpae, Courage, MD  NOVOLOG FLEXPEN 100 UNIT/ML FlexPen INJECT 12-18 UNITS S.Q. THREE TIMES DAILY WITH MEALS. Patient taking differently: Inject 12-18 Units into the skin 3 (three) times daily with meals.  04/29/16   Cassandria Anger, MD  ondansetron (ZOFRAN ODT) 8 MG disintegrating tablet Take 0.5 tablets (4 mg total) by mouth in the morning, at noon, and at bedtime. 03/12/20   Barton Dubois, MD  pantoprazole (PROTONIX) 40 MG tablet Take 1 tablet (40 mg total) by mouth daily. 03/12/20 03/12/21  Barton Dubois, MD  pregabalin (LYRICA) 75 MG capsule  06/27/20   [provider]  promethazine (PHENERGAN) 12.5 MG suppository Place 1 suppository (12.5 mg total) rectally as needed for refractory nausea / vomiting. 03/12/20   Barton Dubois, MD  sertraline (ZOLOFT) 100 MG tablet Take 200 mg by mouth at bedtime.     [provider]  sulfamethoxazole-trimethoprim (BACTRIM DS) 800-160 MG tablet Take 1 tablet by mouth 2 (two) times daily. 03/22/20   [provider]  ticagrelor ([name]) 90 MG TABS tablet Take 1 tablet (90 mg total) by mouth 2 (two) times daily. 05/04/19   Roxan Hockey, MD  vitamin B-12 (CYANOCOBALAMIN) 1000 MCG tablet Take 1,000 mcg by mouth daily. 06/27/20   [provider]  Vitamin D, Ergocalciferol,  (DRISDOL) 1.25 MG (50000 UNIT) CAPS capsule Take by mouth. 06/27/20   [provider]    Allergies    Canagliflozin and Nsaids  Review of Systems  Review of Systems Ten systems reviewed and are negative for acute change, except as noted in the HPI.   Physical Exam Updated Vital Signs BP (!) 136/98 (BP Location: Right Arm)   Pulse (!) 104   Temp 97.7 F (36.5 C) (Oral)   Resp 18   Ht 5\' 10"  (1.778 m)   Wt 93 kg   SpO2 100%   BMI 29.41 kg/m   Physical Exam Vitals and nursing note reviewed.  Constitutional:      General: She is not in acute distress.    Appearance: She is well-developed. She is obese. She is ill-appearing. She is not diaphoretic.     Comments: Pale   HENT:     Head: Normocephalic and atraumatic.      Mouth/Throat:     Dentition: No dental abscesses.     Comments: Very poor dentition.  Teeth are decayed to the gumline. No obvious gingival abscess or erythema.  Eyes:     General: No scleral icterus.    Conjunctiva/sclera: Conjunctivae normal.  Cardiovascular:     Rate and Rhythm: Normal rate and regular rhythm.     Heart sounds: Normal heart sounds. No murmur heard. No friction rub. No gallop.   Pulmonary:     Effort: Pulmonary effort is normal. No respiratory distress.     Breath sounds: Normal breath sounds.  Abdominal:     General: Bowel sounds are normal. There is no distension.     Palpations: Abdomen is soft. There is no mass.     Tenderness: There is no abdominal tenderness. There is no guarding.  Musculoskeletal:     Cervical back: Normal range of motion.  Skin:    General: Skin is warm and dry.  Neurological:     Mental Status: She is alert and oriented to person, place, and time.  Psychiatric:        Behavior: Behavior normal.      ED Results / Procedures / Treatments   Labs (all labs ordered are listed, but only abnormal results are displayed) Labs Reviewed  SARS CORONAVIRUS 2 (TAT 6-24 HRS)  CBC WITH  DIFFERENTIAL/PLATELET  COMPREHENSIVE METABOLIC PANEL  LIPASE, BLOOD  URINALYSIS, ROUTINE W REFLEX MICROSCOPIC  I-STAT BETA HCG BLOOD, ED ([facility], [facility], [facility] ONLY)    EKG None  Radiology No results found.  Procedures Procedures   Medications Ordered in ED Medications - No data to display  ED Course  I have reviewed the triage vital signs and the nursing notes.  Pertinent labs & imaging results that were available during my care of the patient were reviewed by me and considered in my medical decision making (see chart for details).    MDM Rules/Calculators/A&P                         Patient here with hx  Of DM and cardiac dz.  I ordered and reviewed labs which shows mildly elevated white blood cell count of 11.4, mild normocytic anemia  Urine without evidence of infection.  CMP with elevated blood glucose of 334.  Creatinine stable with renal insufficiency.  Lipase within normal limits.  Covid test is pending.  CT maxillofacial shows evidence of periapical abscess.  Although interpretation of the CT scan suggest facial cellulitis the patient only has mild swelling, no erythema or tenderness of the skin.  Is all seem to be localized to the patient's dental infection.  Patient given IV clindamycin and will discharge on oral  clindamycin, pain medication.  PDMP reviewed during this encounter. Patient appears otherwise appropriate for discharge at this time.  Close follow-up with dentist and return precautions discussed                    final Clinical Impression(s) / ED Diagnoses Final diagnoses:  None    Rx / DC Orders ED Discharge Orders    None       Margarita Mail, PA-C 02/12/21 1746    Fredia Sorrow, MD 02/15/21 [phone]

## 2021-02-12 NOTE — ED Triage Notes (Signed)
Pt reports fever, facial pain, congestion and swelling, and nausea that started yesterday.

## 2021-02-12 NOTE — ED Notes (Signed)
Pt notified again of need for urine sample. Pt reports she still is unable to give a sample at this time.

## 2021-03-28 ENCOUNTER — Other Ambulatory Visit: Payer: Self-pay | Admitting: Internal Medicine

## 2021-03-30 NOTE — Progress Notes (Deleted)
Cardiology Office Note   Date:  03/30/2021   ID:  Anne [name], DOB 1981/06/19, MRN [id number]  PCP:  Alfonse Flavors, MD  Cardiologist:   Dorris Carnes, MD   Pt presents for F/U of CAD      History of Present Illness: Anne [name] is a 40 y.o. female with a history of GERD, anxiety, OA, CKD, Type II DM, gastroparesis, HL, HTN, CAD 9latpresentation anterior MI in 201912/19 >> LHC - dLM 25, mLAD 99, oOM2 100 (R-L collats), irreg RCA, EF 25-35 >> PCI: POBA to mLAD; s/p revasculariazation with POBA to LAD  ), ischemic cardiovmyopathy   Dressler syndrome.   She was recently admitted to [facility] on 03/08/20 with CP   Woke up with nausea  Took Zofran   Later had CP with palpitaitons, diaphoresis.  Went to ED Admitted to [facility]    Trop neg x 2.   Plan for medical Rx    She has been seen by Beckie Salts in past for possible ICD   No plans made   Pt complains of chronic nausea and coughing     I saw the pt in Spring 2021  No outpatient medications have been marked as taking for the 04/01/21 encounter (Appointment) with Fay Records, MD.     Allergies:   Canagliflozin and Nsaids   Past Medical History:  Diagnosis Date  . Acid reflux   . Anxiety   . Arthritis   . 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 CAD (coronary artery disease) 11/13/2018   Late presentation anterior MI 12/19 >> LHC - dLM 25, mLAD 99, oOM2 100 (R-L collats), irreg RCA, EF 25-35 >> PCI: POBA to mLAD  . Chronic systolic CHF (congestive heart failure) ([place]) 11/28/2018   Ischemic CM // late presentation ant MI 10/2018 tx with POBA to LAD (residual CAD with CTO of the OM2) // Echo 12/19:  No mural apical thrombus, septal, apical mid ant and inf HK; mild LVH, EF 30-35, mild MR // Echo 01/2019: EF 30-35, Gr 1 DD, diff HK, apical AK, mild MR   . CKD (chronic kidney disease), stage I   . Diabetes mellitus type 1 ([place])   . Dyspnea   . Former tobacco use   . Gastroparesis    . Hypercholesteremia   . Hypertension   . Myocardial infarction ([place]) 2019  . Osteomyelitis ([place])    a. s/p R forefoot amputation.  . Renal failure   . Sciatica     Past Surgical History:  Procedure Laterality Date  . AMPUTATION Right 11/03/2011   Procedure: AMPUTATION RAY;  Surgeon: Jamesetta So;  Location: [facility];  Service: General;  Laterality: Right;  Right fourth and fifth metatarsal   . APPENDECTOMY    . CARDIAC CATHETERIZATION  10/2018  . CESAREAN SECTION  2004 and 2007   x2  . CORONARY/GRAFT ACUTE MI REVASCULARIZATION N/A 11/11/2018   Procedure: CORONARY/GRAFT ACUTE MI REVASCULARIZATION;  Surgeon: Jettie Booze, MD;  Location: [place] [facility];  Service: Cardiovascular;  Laterality: N/A;  . FRACTURE SURGERY  2000  . INJECTION OF SILICONE OIL Right 73/41/9379   Procedure: Injection Of Silicone Oil;  Surgeon: Bernarda Caffey, MD;  Location: [place];  Service: Ophthalmology;  Laterality: Right;  . LEFT HEART CATH AND CORONARY ANGIOGRAPHY N/A 11/11/2018   Procedure: LEFT HEART CATH AND CORONARY ANGIOGRAPHY;  Surgeon: Jettie Booze, MD;  Location:  [place] INVASIVE CV LAB;  Service: Cardiovascular;  Laterality: N/A;  . MEMBRANE PEEL Right 09/14/2019   Procedure: Antoine Primas;  Surgeon: Bernarda Caffey, MD;  Location: [place];  Service: Ophthalmology;  Laterality: Right;  . PARS PLANA VITRECTOMY Right 09/14/2019   Procedure: Pars Plana Vitrectomy With 25 Gauge;  Surgeon: Bernarda Caffey, MD;  Location: [place];  Service: Ophthalmology;  Laterality: Right;  . PHOTOCOAGULATION WITH LASER Right 09/14/2019   Procedure: Photocoagulation With Laser;  Surgeon: Bernarda Caffey, MD;  Location: [place];  Service: Ophthalmology;  Laterality: Right;  . 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   Procedure: REPAIR OF COMPLEX TRACTION RETINAL DETACHMENT;  Surgeon: Bernarda Caffey, MD;  Location: [place];  Service: Ophthalmology;  Laterality: Right;  . TUBAL LIGATION        Social History:  The patient  reports that she quit smoking about 7 years ago. Her smoking use included cigarettes. She has a 5.00 pack-year smoking history. She has never used smokeless tobacco. She reports that she does not drink alcohol and does not use drugs.   Family History:  The patient's family history includes Alcoholism in her father; Anemia in her mother; Asthma in her mother; Diabetes in her father, paternal grandfather, and paternal grandmother; Heart attack in her paternal grandmother; Hypertension in her mother; Kidney disease in her mother; Lung cancer in her father.    ROS:  Please see the history of present illness. All other systems are reviewed and  Negative to the above problem except as noted.    PHYSICAL EXAM: VS:  There were no vitals taken for this visit.  GEN: Well nourished, well developed, in no acute distress  HEENT: normal  Neck: no JVD, carotid bruits Cardiac: RRR; no murmurs ,no LE edema  Respiratory:  clear to auscultation bilaterally GI: soft, nontender, nondistended, + BS  No hepatomegaly  MS: no deformity Moving all extremities   Skin: warm and dry, no rash Neuro:  Strength and sensation are intact Psych: euthymic mood, full affect   EKG:  EKG is not ordered today.   Lipid Panel    Component Value Date/Time   CHOL 93 (L) 06/04/2020 0923   TRIG 277 (H) 06/04/2020 0923   HDL 21 (L) 06/04/2020 0923   CHOLHDL 4.4 06/04/2020 0923   CHOLHDL 6.8 11/11/2018 1304   VLDL 33 11/11/2018 1304   LDLCALC 30 06/04/2020 0923   LDLDIRECT 98 02/10/2012 1230    Prior cardiac studies  The following studies were reviewed today:  Echo 11/13/18 Study Conclusions  - Left ventricle: No mural apical thrombus with definity. septal apical mid anterior and inferior hypokinesis preserved basal function The cavity size was moderately dilated. Wall thickness was increased in a pattern of mild LVH. Systolic function was moderately to severely reduced.  The estimated ejection fraction was in the range of 30% to 35%. - Mitral valve: There was mild regurgitation. - Atrial septum: No defect or patent foramen ovale was identified.  LEFT HEART CATH AND CORONARY ANGIOGRAPHY  CORONARY/GRAFT ACUTE MI REVASCULARIZATION12/20/19   Conclusion   Ost 2nd Mrg to 2nd Mrg lesion is 100% stenosed. Likely chronic occlusion with right to left collaterals.  Mid LAD lesion is 99% stenosed. PTCA with 2.0 mm balloon performed.  Post intervention, there is a 25% residual stenosis.  Dist LM lesion is 25% stenosed.  There is severe left ventricular systolic dysfunction.  The left ventricular ejection fraction is 25-35% by visual estimate.  LV end diastolic pressure is mildly  elevated.  There is no aortic valve stenosis.       Wt Readings from Last 3 Encounters:  02/12/21 205 lb (93 kg)  04/01/20 196 lb 12.8 oz (89.3 kg)  03/08/20 198 lb (89.8 kg)      ASSESSMENT AND PLAN:  1  CAD   Recent eval in hosp     I am not convinced of active angina  APpears more GI    Follow for now     2  Chronic systolic CHF   Pt's volume status is OK   She did not tolerate entresto in the past   I would keep on same meds   Get echo fo confirm LVEF    May set up to see Beckie Salts (had been seen in pst for possible ICD   Had foot infection at time   This ahs healed. Check BMET   3  HL   Need to check lipids today  4  HTN  BP is OK Keep on current regimen   Check BMET  5  DM   Follows with M Altheimer   Says she needs to make an appt  6  GI   Pt with gastroparesis  Significant   Keep on Reglan     Current medicines are reviewed at length with the patient today.  The patient does not have concerns regarding medicines.  Signed, Dorris Carnes, MD  03/30/2021 8:50 PM    [facility] [address], [place], [place]  [address] Phone: [phone]; Fax: [phone]

## 2021-04-01 ENCOUNTER — Ambulatory Visit: Payer: Medicare Other | Admitting: Internal Medicine

## 2021-04-25 ENCOUNTER — Other Ambulatory Visit: Payer: Self-pay | Admitting: Internal Medicine

## 2021-06-20 ENCOUNTER — Other Ambulatory Visit: Payer: Self-pay | Admitting: Internal Medicine

## 2021-07-09 ENCOUNTER — Other Ambulatory Visit: Payer: Self-pay

## 2021-07-09 DIAGNOSIS — E1122 Type 2 diabetes mellitus with diabetic chronic kidney disease: Secondary | ICD-10-CM | POA: Insufficient documentation

## 2021-07-09 DIAGNOSIS — E1140 Type 2 diabetes mellitus with diabetic neuropathy, unspecified: Secondary | ICD-10-CM | POA: Diagnosis not present

## 2021-07-09 DIAGNOSIS — Z89421 Acquired absence of other right toe(s): Secondary | ICD-10-CM | POA: Diagnosis not present

## 2021-07-09 DIAGNOSIS — Z7984 Long term (current) use of oral hypoglycemic drugs: Secondary | ICD-10-CM | POA: Insufficient documentation

## 2021-07-09 DIAGNOSIS — R072 Precordial pain: Secondary | ICD-10-CM | POA: Diagnosis not present

## 2021-07-09 DIAGNOSIS — Z79899 Other long term (current) drug therapy: Secondary | ICD-10-CM | POA: Diagnosis not present

## 2021-07-09 DIAGNOSIS — M25512 Pain in left shoulder: Secondary | ICD-10-CM | POA: Diagnosis not present

## 2021-07-09 DIAGNOSIS — L97509 Non-pressure chronic ulcer of other part of unspecified foot with unspecified severity: Secondary | ICD-10-CM | POA: Diagnosis not present

## 2021-07-09 DIAGNOSIS — I2510 Atherosclerotic heart disease of native coronary artery without angina pectoris: Secondary | ICD-10-CM | POA: Insufficient documentation

## 2021-07-09 DIAGNOSIS — E1169 Type 2 diabetes mellitus with other specified complication: Secondary | ICD-10-CM | POA: Insufficient documentation

## 2021-07-09 DIAGNOSIS — I130 Hypertensive heart and chronic kidney disease with heart failure and stage 1 through stage 4 chronic kidney disease, or unspecified chronic kidney disease: Secondary | ICD-10-CM | POA: Diagnosis not present

## 2021-07-09 DIAGNOSIS — Z794 Long term (current) use of insulin: Secondary | ICD-10-CM | POA: Diagnosis not present

## 2021-07-09 DIAGNOSIS — I5022 Chronic systolic (congestive) heart failure: Secondary | ICD-10-CM | POA: Insufficient documentation

## 2021-07-09 DIAGNOSIS — N181 Chronic kidney disease, stage 1: Secondary | ICD-10-CM | POA: Insufficient documentation

## 2021-07-09 DIAGNOSIS — E785 Hyperlipidemia, unspecified: Secondary | ICD-10-CM | POA: Insufficient documentation

## 2021-07-09 DIAGNOSIS — Z87891 Personal history of nicotine dependence: Secondary | ICD-10-CM | POA: Insufficient documentation

## 2021-07-09 DIAGNOSIS — Z7982 Long term (current) use of aspirin: Secondary | ICD-10-CM | POA: Insufficient documentation

## 2021-07-09 DIAGNOSIS — E11621 Type 2 diabetes mellitus with foot ulcer: Secondary | ICD-10-CM | POA: Diagnosis not present

## 2021-07-09 NOTE — ED Triage Notes (Signed)
Left chest pain and left arm pain . Shoulder pain has been going on all day. Then it got worse. Took 2 ntg pta. Then 1 in the lobby.

## 2021-07-10 ENCOUNTER — Encounter (HOSPITAL_COMMUNITY): Payer: Self-pay

## 2021-07-10 ENCOUNTER — Emergency Department (HOSPITAL_COMMUNITY): Payer: Medicare Other

## 2021-07-10 ENCOUNTER — Emergency Department (HOSPITAL_COMMUNITY)
Admission: EM | Admit: 2021-07-10 | Discharge: 2021-07-10 | Disposition: A | Discharge status: Home / Self Care | Payer: Medicare Other | Attending: Emergency Medicine | Admitting: Emergency Medicine

## 2021-07-10 DIAGNOSIS — R072 Precordial pain: Secondary | ICD-10-CM | POA: Diagnosis not present

## 2021-07-10 DIAGNOSIS — R079 Chest pain, unspecified: Secondary | ICD-10-CM

## 2021-07-10 LAB — BASIC METABOLIC PANEL
Anion gap: 9 (ref 5–15)
BUN: 20 mg/dL (ref 6–20)
CO2: 21 mmol/L — ABNORMAL LOW (ref 22–32)
Calcium: 8.3 mg/dL — ABNORMAL LOW (ref 8.9–10.3)
Chloride: 102 mmol/L (ref 98–111)
Creatinine, Ser: 1.43 mg/dL — ABNORMAL HIGH (ref 0.44–1.00)
GFR, Estimated: 48 mL/min — ABNORMAL LOW (ref >60)
Glucose, Bld: 345 mg/dL — ABNORMAL HIGH (ref 70–99)
Potassium: 4 mmol/L (ref 3.5–5.1)
Sodium: 132 mmol/L — ABNORMAL LOW (ref 135–145)

## 2021-07-10 LAB — CBC
HCT: 34.3 % — ABNORMAL LOW (ref 36.0–46.0)
Hemoglobin: 10.9 g/dL — ABNORMAL LOW (ref 12.0–15.0)
MCH: 26.2 pg (ref 26.0–34.0)
MCHC: 31.8 g/dL (ref 30.0–36.0)
MCV: 82.5 fL (ref 80.0–100.0)
Platelets: 252 10*3/uL (ref 150–400)
RBC: 4.16 MIL/uL (ref 3.87–5.11)
RDW: 14.4 % (ref 11.5–15.5)
WBC: 5.7 10*3/uL (ref 4.0–10.5)
nRBC: 0 % (ref 0.0–0.2)

## 2021-07-10 LAB — TROPONIN I (HIGH SENSITIVITY)
Troponin I (High Sensitivity): 6 ng/L (ref <18)
Troponin I (High Sensitivity): 5 ng/L (ref <18)

## 2021-07-10 MED ORDER — ONDANSETRON HCL 4 MG PO TABS: 4.0000 mg | ORAL_TABLET | Freq: Three times a day (TID) | ORAL | 0 refills | Status: AC | PRN

## 2021-07-10 MED ORDER — METHOCARBAMOL 500 MG PO TABS
500.0000 mg | ORAL_TABLET | Freq: Once | ORAL | Status: AC
  Administered 2021-07-10: 500 mg via ORAL
  Filled 2021-07-10: qty 1

## 2021-07-10 MED ORDER — ONDANSETRON HCL 4 MG/2ML IJ SOLN
4.0000 mg | Freq: Once | INTRAMUSCULAR | Status: AC
  Administered 2021-07-10: 4 mg via INTRAVENOUS
  Filled 2021-07-10: qty 2

## 2021-07-10 MED ORDER — ONDANSETRON 4 MG PO TBDP: 4.0000 mg | ORAL_TABLET | Freq: Three times a day (TID) | ORAL | 0 refills | Status: DC | PRN

## 2021-07-10 MED ORDER — OXYCODONE-ACETAMINOPHEN 5-325 MG PO TABS
2.0000 | ORAL_TABLET | Freq: Once | ORAL | Status: AC
  Administered 2021-07-10: 2 via ORAL
  Filled 2021-07-10: qty 2

## 2021-07-10 MED ORDER — FENTANYL CITRATE PF 50 MCG/ML IJ SOSY
100.0000 ug | PREFILLED_SYRINGE | Freq: Once | INTRAMUSCULAR | Status: AC
  Administered 2021-07-10: 100 ug via INTRAVENOUS
  Filled 2021-07-10: qty 2

## 2021-07-10 NOTE — ED Provider Notes (Signed)
[facility] EMERGENCY DEPARTMENT Provider Note   CSN: [id number] Arrival date & time: 07/09/21  2330     History Chief Complaint  Patient presents with   Chest Pain    Anne [name] is a 40 y.o. female.   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 [facility])    CAD (coronary artery disease) 11/13/2018   Late presentation anterior MI 12/19 >> LHC - dLM 25, mLAD 99, oOM2 100 (R-L collats), irreg RCA, EF 25-35 >> PCI: POBA to mLAD   Chronic systolic CHF (congestive heart failure) ([place]) 11/28/2018   Ischemic CM // late presentation ant MI 10/2018 tx with POBA to LAD (residual CAD with CTO of the OM2) // Echo 12/19:  No mural apical thrombus, septal, apical mid ant and inf HK; mild LVH, EF 30-35, mild MR // Echo 01/2019: EF 30-35, Gr 1 DD, diff HK, apical AK, mild MR    CKD (chronic kidney disease), stage I    Diabetes mellitus type 1 ([place])    Dyspnea    Former tobacco use    Gastroparesis    Hypercholesteremia    Hypertension    Myocardial infarction ([place]) 2019   Osteomyelitis ([place])    a. s/p R forefoot amputation.   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[place])  11/13/2018   Dressler's syndrome ([place]) 11/13/2018   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 11/11/2018   Osteomyelitis ([place]) 07/01/2018   Personal history of noncompliance with medical treatment, presenting hazards to health 10/28/2015   Multiple nevi 10/07/2012   Toe ulcer ([place]) 03/11/2012   Essential hypertension 03/11/2012   Type 2 diabetes mellitus with vascular disease ([place]) 02/11/2012   Diabetic neuropathy ([place]) 02/11/2012   Back pain 02/11/2012   Muscle spasm 02/11/2012   Hyperlipidemia associated with type 2 diabetes mellitus ([place]) 02/11/2012   Obesity 02/11/2012   Cellulitis in diabetic foot ([place]) 10/31/2011    Past Surgical History:  Procedure Laterality Date   AMPUTATION Right 11/03/2011   Procedure: AMPUTATION RAY;  Surgeon: Jamesetta So;  Location: [facility];  Service: General;  Laterality: Right;  Right fourth and fifth metatarsal    APPENDECTOMY     CARDIAC CATHETERIZATION  10/2018   CESAREAN SECTION  2004 and 2007   x2   CORONARY/GRAFT ACUTE MI REVASCULARIZATION N/A 11/11/2018   Procedure: CORONARY/GRAFT ACUTE MI REVASCULARIZATION;  Surgeon: Jettie Booze, MD;  Location: [place] [facility];  Service: Cardiovascular;  Laterality: N/A;   FRACTURE SURGERY  2000  INJECTION OF SILICONE OIL Right 123XX123   Procedure: Injection Of Silicone Oil;  Surgeon: Bernarda Caffey, MD;  Location: [place];  Service: Ophthalmology;  Laterality: Right;   LEFT HEART CATH AND CORONARY ANGIOGRAPHY N/A 11/11/2018   Procedure: LEFT HEART CATH AND CORONARY ANGIOGRAPHY;  Surgeon: Jettie Booze, MD;  Location: [place] [facility];  Service: Cardiovascular;  Laterality: N/A;   MEMBRANE PEEL Right 09/14/2019   Procedure: Antoine Primas;  Surgeon: Bernarda Caffey, MD;  Location: [place];  Service: Ophthalmology;  Laterality: Right;   PARS PLANA VITRECTOMY Right 09/14/2019   Procedure: Pars Plana Vitrectomy With 25 Gauge;  Surgeon:  Bernarda Caffey, MD;  Location: [place];  Service: Ophthalmology;  Laterality: Right;   PHOTOCOAGULATION WITH LASER Right 09/14/2019   Procedure: Photocoagulation With Laser;  Surgeon: Bernarda Caffey, MD;  Location: [place];  Service: Ophthalmology;  Laterality: Right;   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     Family History  Problem Relation Age of Onset   Diabetes Father    Lung cancer Father    Alcoholism Father    Asthma Mother    Kidney disease Mother    Anemia Mother        hemolytic   Hypertension Mother    Heart attack Paternal Grandmother    Diabetes Paternal Grandmother    Diabetes Paternal Grandfather    Anesthesia problems Neg Hx    Hypotension Neg Hx    Malignant hyperthermia Neg Hx    Pseudochol deficiency Neg Hx     Social History   Tobacco Use   Smoking status: Former    Packs/day: 0.25    Years: 20.00    Pack years: 5.00    Types: Cigarettes    Quit date: 12/29/2013    Years since quitting: 7.5   Smokeless tobacco: Never  Vaping Use   Vaping Use: Never used  Substance Use Topics   Alcohol use: No   Drug use: No    Home Medications Prior to Admission medications   Medication Sig Start Date End Date Taking? Authorizing Provider  ondansetron (ZOFRAN ODT) 4 MG disintegrating tablet Take 1 tablet (4 mg total) by mouth every 8 (eight) hours as needed. '4mg'$  ODT q4 hours prn nausea/vomit 07/10/21  Yes Zsofia Prout, Corene Cornea, MD  ondansetron (ZOFRAN) 4 MG tablet Take 1 tablet (4 mg total) by mouth every 8 (eight) hours as needed for nausea or vomiting. 07/10/21  Yes Rocquel Askren, Corene Cornea, MD  acetaminophen (TYLENOL) 500 MG tablet Take 500 mg by mouth every 4 (four) hours as needed.    [provider]  albuterol (VENTOLIN HFA) 108  (90 Base) MCG/ACT inhaler Inhale 2 puffs into the lungs every 6 (six) hours as needed for wheezing or shortness of breath.    [provider]  aspirin (GNP ASPIRIN LOW DOSE) 81 MG EC tablet Take 1 tablet (81 mg total) by mouth daily with breakfast. Swallow whole. 05/04/19   Roxan Hockey, MD  atorvastatin (LIPITOR) 80 MG tablet Take 1 tablet (80 mg total) by mouth every evening. 05/04/19   Emokpae, Courage, MD  BAQSIMI TWO PACK 3 MG/DOSE POWD Place 3 mg into the nose once as needed (for emergency low blood sugar levels).  01/24/20  [provider]  brimonidine (ALPHAGAN) 0.2 % ophthalmic solution Place 1 drop into the right eye 2 (two) times daily. 07/12/20   Bernarda Caffey, MD  BYDUREON BCISE 2 MG/0.85ML AUIJ Inject 2 mg into the skin every Thursday. 12/26/19   [provider]  carvedilol (COREG) 3.125 MG tablet Take 1 tablet (3.125 mg total) by mouth 2 (two) times daily. 01/31/21 05/01/21  Fay Records, MD  clindamycin (CLEOCIN) 150 MG capsule Take 2 capsules (300 mg total) by mouth 3 (three) times daily. May dispense as '150mg'$  capsules 02/12/21   Harris, Abigail, PA-C  dorzolamide-timolol (COSOPT) 22.3-6.8 MG/ML ophthalmic solution Place 1 drop into the right eye 2 (two) times daily. 07/12/20   Bernarda Caffey, MD  ezetimibe (ZETIA) 10 MG tablet TAKE 1 TABLET BY MOUTH ONCE DAILY. 03/28/21   Fay Records, MD  fenofibrate (TRICOR) 145 MG tablet Take 1 tablet (145 mg total) by mouth daily. 12/26/19   Daune Perch, NP  furosemide (LASIX) 40 MG tablet Take 1 tablet (40 mg total) by mouth 2 (two) times daily. 05/04/19 03/08/20  Roxan Hockey, MD  isosorbide mononitrate (IMDUR) 30 MG 24 hr tablet TAKE 1 TABLET BY MOUTH ONCE DAILY. 06/20/21   Fay Records, MD  LANTUS SOLOSTAR 100 UNIT/ML Solostar Pen INNJECT 50 UNITS S.Q. ONCE DAILY AT 10 P.M. Patient taking differently: Inject 50 Units into the skin at bedtime. 02/03/16   Cassandria Anger, MD  losartan (COZAAR) 25 MG tablet Take 1  tablet (25 mg total) by mouth every evening. 05/04/19   Denton Brick, Courage, MD  metFORMIN (GLUCOPHAGE) 1000 MG tablet Take 1,000 mg by mouth 2 (two) times daily with a meal.    [provider]  metoCLOPramide (REGLAN) 5 MG tablet Take 1 tablet (5 mg total) by mouth 3 (three) times daily before meals. 03/12/20   Barton Dubois, MD  nitroGLYCERIN (NITROSTAT) 0.4 MG SL tablet Place 1 tablet (0.4 mg total) under the tongue every 5 (five) minutes as needed. 05/04/19   Emokpae, Courage, MD  NOVOLOG FLEXPEN 100 UNIT/ML FlexPen INJECT 12-18 UNITS S.Q. THREE TIMES DAILY WITH MEALS. Patient taking differently: Inject 12-18 Units into the skin 3 (three) times daily with meals. 04/29/16   Cassandria Anger, MD  oxyCODONE (ROXICODONE) 5 MG immediate release tablet Take 0.5-1 tablets (2.5-5 mg total) by mouth every 6 (six) hours as needed for severe pain. 02/12/21   Margarita Mail, PA-C  pantoprazole (PROTONIX) 40 MG tablet Take 1 tablet (40 mg total) by mouth daily. 03/12/20 03/12/21  Barton Dubois, MD  pregabalin (LYRICA) 75 MG capsule Take 75 mg by mouth daily. 06/27/20   [provider]  promethazine (PHENERGAN) 12.5 MG suppository Place 1 suppository (12.5 mg total) rectally as needed for refractory nausea / vomiting. 03/12/20   Barton Dubois, MD  sertraline (ZOLOFT) 100 MG tablet Take 200 mg by mouth at bedtime.     [provider]  ticagrelor ([name]) 90 MG TABS tablet Take 1 tablet (90 mg total) by mouth 2 (two) times daily. 05/04/19   Roxan Hockey, MD  Vitamin D, Ergocalciferol, (DRISDOL) 1.25 MG (50000 UNIT) CAPS capsule Take 50,000 Units by mouth every 7 (seven) days. 06/27/20   [provider]    Allergies    Canagliflozin and Nsaids  Review of Systems   Review of Systems  Constitutional:  Negative for fatigue and fever.  Cardiovascular:  Positive for chest pain.  Gastrointestinal:  Negative for abdominal pain.  All other systems reviewed and are  negative.  Physical Exam Updated Vital Signs BP 130/89   Pulse 85   Temp 97.8 F (36.6 C) (Oral)   Resp 15   Ht '5\' 10"'$  (1.778 m)   Wt 93 kg   LMP 07/03/2021   SpO2 100%   BMI 29.42 kg/m   Physical Exam Vitals and nursing note reviewed.  Constitutional:      Appearance: She is well-developed.  HENT:     Head: Normocephalic and atraumatic.  Cardiovascular:     Rate and Rhythm: Normal rate and regular rhythm.  Pulmonary:     Effort: No respiratory distress.     Breath sounds: No stridor. No decreased breath sounds.  Chest:     Chest wall: No mass or tenderness.  Abdominal:     General: There is no distension or abdominal bruit.     Palpations: Abdomen is soft. There is no fluid wave.  Musculoskeletal:     Cervical back: Normal range of motion.     Right lower leg: Tenderness (left trapezius, shoulder, upper chest) present.  Skin:    General: Skin is warm and dry.  Neurological:     Mental Status: She is alert.    ED Results / Procedures / Treatments   Labs (all labs ordered are listed, but only abnormal results are displayed) Labs Reviewed  BASIC METABOLIC PANEL - Abnormal; Notable for the following components:      Result Value   Sodium 132 (*)    CO2 21 (*)    Glucose, Bld 345 (*)    Creatinine, Ser 1.43 (*)    Calcium 8.3 (*)    GFR, Estimated 48 (*)    All other components within normal limits  CBC - Abnormal; Notable for the following components:   Hemoglobin 10.9 (*)    HCT 34.3 (*)    All other components within normal limits  TROPONIN I (HIGH SENSITIVITY)  TROPONIN I (HIGH SENSITIVITY)    EKG EKG Interpretation  Date/Time:  Thursday July 10 2021 00:01:42 EDT Ventricular Rate:  104 PR Interval:  136 QRS Duration: 80 QT Interval:  352 QTC Calculation: 462 R Axis:   199 Text Interpretation: Sinus tachycardia Right superior axis deviation Pulmonary disease pattern Septal infarct , age undetermined Abnormal ECG Confirmed by Merrily Pew  [phone]) on 07/10/2021 12:05:28 AM  Radiology DG Chest 2 View  Result Date: 07/10/2021 CLINICAL DATA:  Left-sided chest pain EXAM: CHEST - 2 VIEW COMPARISON:  03/08/2020 FINDINGS: The heart size and mediastinal contours are within normal limits. Both lungs are clear. The visualized skeletal structures are unremarkable. IMPRESSION: No active cardiopulmonary disease. Electronically Signed   By: Inez Catalina M.D.   On: 07/10/2021 00:48    Procedures Procedures   Medications Ordered in ED Medications  fentaNYL (SUBLIMAZE) injection 100 mcg (100 mcg Intravenous Given 07/10/21 0124)  methocarbamol (ROBAXIN) tablet 500 mg (500 mg Oral Given 07/10/21 0124)  ondansetron (ZOFRAN) injection 4 mg (4 mg Intravenous Given 07/10/21 0354)  oxyCODONE-acetaminophen (PERCOCET/ROXICET) 5-325 MG per tablet 2 tablet (2 tablets Oral Given 07/10/21 0440)    ED Course  I have reviewed the triage vital signs and the nursing notes.  Pertinent labs & imaging results that were available during my care of the patient were reviewed by me and considered in my medical decision making (see chart for details).    MDM Rules/Calculators/A&P  Suspect MSK causes. Ruled out ACS with labs/ecg.   Final Clinical Impression(s) / ED Diagnoses Final diagnoses:  Nonspecific chest pain    Rx / DC Orders ED Discharge Orders          Ordered    ondansetron (ZOFRAN) 4 MG tablet  Every 8 hours PRN        07/10/21 0440    ondansetron (ZOFRAN ODT) 4 MG disintegrating tablet  Every 8 hours PRN        07/10/21 0440             Gerald Honea, Corene Cornea, MD 07/10/21 [id number]

## 2021-07-10 NOTE — ED Notes (Signed)
Patient transported to X-ray 

## 2021-07-10 NOTE — ED Notes (Signed)
Discharge instructions discussed with pt. Pt verbalize understanding with no questions at this time. Pt to go home with family at bedside.

## 2021-08-05 ENCOUNTER — Other Ambulatory Visit (HOSPITAL_COMMUNITY): Payer: Self-pay | Admitting: Family Medicine

## 2021-08-05 DIAGNOSIS — R1110 Vomiting, unspecified: Secondary | ICD-10-CM

## 2021-08-12 ENCOUNTER — Encounter (HOSPITAL_COMMUNITY): Payer: Self-pay

## 2021-08-12 ENCOUNTER — Ambulatory Visit (HOSPITAL_COMMUNITY): Payer: Medicare Other

## 2021-10-17 ENCOUNTER — Emergency Department (HOSPITAL_COMMUNITY): Payer: Medicare Other

## 2021-10-17 ENCOUNTER — Encounter (HOSPITAL_COMMUNITY): Payer: Self-pay

## 2021-10-17 ENCOUNTER — Inpatient Hospital Stay (HOSPITAL_COMMUNITY)
Admission: EM | Admit: 2021-10-17 | Discharge: 2021-10-28 | DRG: 240 | Disposition: A | Discharge status: Home Health | Payer: Medicare Other | Attending: Internal Medicine | Admitting: Internal Medicine

## 2021-10-17 ENCOUNTER — Other Ambulatory Visit: Payer: Self-pay

## 2021-10-17 DIAGNOSIS — E1152 Type 2 diabetes mellitus with diabetic peripheral angiopathy with gangrene: Principal | ICD-10-CM | POA: Diagnosis present

## 2021-10-17 DIAGNOSIS — D509 Iron deficiency anemia, unspecified: Secondary | ICD-10-CM | POA: Diagnosis present

## 2021-10-17 DIAGNOSIS — Z20822 Contact with and (suspected) exposure to covid-19: Secondary | ICD-10-CM | POA: Diagnosis present

## 2021-10-17 DIAGNOSIS — Z01810 Encounter for preprocedural cardiovascular examination: Secondary | ICD-10-CM

## 2021-10-17 DIAGNOSIS — M869 Osteomyelitis, unspecified: Secondary | ICD-10-CM | POA: Diagnosis present

## 2021-10-17 DIAGNOSIS — E876 Hypokalemia: Secondary | ICD-10-CM | POA: Diagnosis not present

## 2021-10-17 DIAGNOSIS — I2510 Atherosclerotic heart disease of native coronary artery without angina pectoris: Secondary | ICD-10-CM | POA: Diagnosis present

## 2021-10-17 DIAGNOSIS — E1143 Type 2 diabetes mellitus with diabetic autonomic (poly)neuropathy: Secondary | ICD-10-CM | POA: Diagnosis present

## 2021-10-17 DIAGNOSIS — Z7982 Long term (current) use of aspirin: Secondary | ICD-10-CM

## 2021-10-17 DIAGNOSIS — I255 Ischemic cardiomyopathy: Secondary | ICD-10-CM | POA: Diagnosis present

## 2021-10-17 DIAGNOSIS — M62838 Other muscle spasm: Secondary | ICD-10-CM | POA: Diagnosis not present

## 2021-10-17 DIAGNOSIS — Z811 Family history of alcohol abuse and dependence: Secondary | ICD-10-CM

## 2021-10-17 DIAGNOSIS — Z7902 Long term (current) use of antithrombotics/antiplatelets: Secondary | ICD-10-CM

## 2021-10-17 DIAGNOSIS — Z87891 Personal history of nicotine dependence: Secondary | ICD-10-CM

## 2021-10-17 DIAGNOSIS — L03116 Cellulitis of left lower limb: Secondary | ICD-10-CM | POA: Diagnosis present

## 2021-10-17 DIAGNOSIS — Z7984 Long term (current) use of oral hypoglycemic drugs: Secondary | ICD-10-CM

## 2021-10-17 DIAGNOSIS — I130 Hypertensive heart and chronic kidney disease with heart failure and stage 1 through stage 4 chronic kidney disease, or unspecified chronic kidney disease: Secondary | ICD-10-CM | POA: Diagnosis present

## 2021-10-17 DIAGNOSIS — Z89431 Acquired absence of right foot: Secondary | ICD-10-CM

## 2021-10-17 DIAGNOSIS — E785 Hyperlipidemia, unspecified: Secondary | ICD-10-CM | POA: Diagnosis present

## 2021-10-17 DIAGNOSIS — Z841 Family history of disorders of kidney and ureter: Secondary | ICD-10-CM

## 2021-10-17 DIAGNOSIS — Z79899 Other long term (current) drug therapy: Secondary | ICD-10-CM

## 2021-10-17 DIAGNOSIS — M86172 Other acute osteomyelitis, left ankle and foot: Secondary | ICD-10-CM | POA: Diagnosis present

## 2021-10-17 DIAGNOSIS — I5022 Chronic systolic (congestive) heart failure: Secondary | ICD-10-CM | POA: Diagnosis present

## 2021-10-17 DIAGNOSIS — K3184 Gastroparesis: Secondary | ICD-10-CM | POA: Diagnosis present

## 2021-10-17 DIAGNOSIS — L97529 Non-pressure chronic ulcer of other part of left foot with unspecified severity: Secondary | ICD-10-CM | POA: Diagnosis present

## 2021-10-17 DIAGNOSIS — E1169 Type 2 diabetes mellitus with other specified complication: Secondary | ICD-10-CM | POA: Diagnosis present

## 2021-10-17 DIAGNOSIS — W19XXXA Unspecified fall, initial encounter: Secondary | ICD-10-CM | POA: Diagnosis not present

## 2021-10-17 DIAGNOSIS — R112 Nausea with vomiting, unspecified: Secondary | ICD-10-CM | POA: Diagnosis present

## 2021-10-17 DIAGNOSIS — Z794 Long term (current) use of insulin: Secondary | ICD-10-CM

## 2021-10-17 DIAGNOSIS — E1122 Type 2 diabetes mellitus with diabetic chronic kidney disease: Secondary | ICD-10-CM | POA: Diagnosis present

## 2021-10-17 DIAGNOSIS — E11621 Type 2 diabetes mellitus with foot ulcer: Secondary | ICD-10-CM | POA: Diagnosis present

## 2021-10-17 DIAGNOSIS — E11628 Type 2 diabetes mellitus with other skin complications: Secondary | ICD-10-CM | POA: Diagnosis present

## 2021-10-17 DIAGNOSIS — N181 Chronic kidney disease, stage 1: Secondary | ICD-10-CM | POA: Diagnosis present

## 2021-10-17 DIAGNOSIS — E1159 Type 2 diabetes mellitus with other circulatory complications: Secondary | ICD-10-CM | POA: Diagnosis present

## 2021-10-17 DIAGNOSIS — Z833 Family history of diabetes mellitus: Secondary | ICD-10-CM

## 2021-10-17 DIAGNOSIS — I10 Essential (primary) hypertension: Secondary | ICD-10-CM | POA: Diagnosis present

## 2021-10-17 DIAGNOSIS — D631 Anemia in chronic kidney disease: Secondary | ICD-10-CM | POA: Diagnosis present

## 2021-10-17 DIAGNOSIS — Z825 Family history of asthma and other chronic lower respiratory diseases: Secondary | ICD-10-CM

## 2021-10-17 DIAGNOSIS — Z801 Family history of malignant neoplasm of trachea, bronchus and lung: Secondary | ICD-10-CM

## 2021-10-17 DIAGNOSIS — Z28310 Unvaccinated for covid-19: Secondary | ICD-10-CM

## 2021-10-17 DIAGNOSIS — I252 Old myocardial infarction: Secondary | ICD-10-CM

## 2021-10-17 DIAGNOSIS — K219 Gastro-esophageal reflux disease without esophagitis: Secondary | ICD-10-CM | POA: Diagnosis present

## 2021-10-17 DIAGNOSIS — Z9861 Coronary angioplasty status: Secondary | ICD-10-CM

## 2021-10-17 DIAGNOSIS — Z888 Allergy status to other drugs, medicaments and biological substances status: Secondary | ICD-10-CM

## 2021-10-17 DIAGNOSIS — E1142 Type 2 diabetes mellitus with diabetic polyneuropathy: Secondary | ICD-10-CM | POA: Diagnosis present

## 2021-10-17 DIAGNOSIS — Z8249 Family history of ischemic heart disease and other diseases of the circulatory system: Secondary | ICD-10-CM

## 2021-10-17 LAB — CBC
HCT: 30.8 % — ABNORMAL LOW (ref 36.0–46.0)
Hemoglobin: 10 g/dL — ABNORMAL LOW (ref 12.0–15.0)
MCH: 26.1 pg (ref 26.0–34.0)
MCHC: 32.5 g/dL (ref 30.0–36.0)
MCV: 80.4 fL (ref 80.0–100.0)
Platelets: 446 10*3/uL — ABNORMAL HIGH (ref 150–400)
RBC: 3.83 MIL/uL — ABNORMAL LOW (ref 3.87–5.11)
RDW: 13.7 % (ref 11.5–15.5)
WBC: 17.6 10*3/uL — ABNORMAL HIGH (ref 4.0–10.5)
nRBC: 0 % (ref 0.0–0.2)

## 2021-10-17 LAB — COMPREHENSIVE METABOLIC PANEL
ALT: 12 U/L (ref 0–44)
AST: 13 U/L — ABNORMAL LOW (ref 15–41)
Albumin: 3.3 g/dL — ABNORMAL LOW (ref 3.5–5.0)
Alkaline Phosphatase: 77 U/L (ref 38–126)
Anion gap: 17 — ABNORMAL HIGH (ref 5–15)
BUN: 17 mg/dL (ref 6–20)
CO2: 25 mmol/L (ref 22–32)
Calcium: 8.9 mg/dL (ref 8.9–10.3)
Chloride: 86 mmol/L — ABNORMAL LOW (ref 98–111)
Creatinine, Ser: 1.25 mg/dL — ABNORMAL HIGH (ref 0.44–1.00)
GFR, Estimated: 56 mL/min — ABNORMAL LOW (ref >60)
Glucose, Bld: 380 mg/dL — ABNORMAL HIGH (ref 70–99)
Potassium: 2.8 mmol/L — ABNORMAL LOW (ref 3.5–5.1)
Sodium: 128 mmol/L — ABNORMAL LOW (ref 135–145)
Total Bilirubin: 1 mg/dL (ref 0.3–1.2)
Total Protein: 9 g/dL — ABNORMAL HIGH (ref 6.5–8.1)

## 2021-10-17 LAB — LACTIC ACID, PLASMA: Lactic Acid, Venous: 1 mmol/L (ref 0.5–1.9)

## 2021-10-17 LAB — LIPASE, BLOOD: Lipase: 19 U/L (ref 11–51)

## 2021-10-17 LAB — HCG, QUANTITATIVE, PREGNANCY: hCG, Beta Chain, Quant, S: <1 m[IU]/mL (ref <5)

## 2021-10-17 MED ORDER — POTASSIUM CHLORIDE 10 MEQ/100ML IV SOLN
10.0000 meq | INTRAVENOUS | Status: AC
  Administered 2021-10-17 – 2021-10-18 (×2): 10 meq via INTRAVENOUS
  Filled 2021-10-17 (×2): qty 100

## 2021-10-17 MED ORDER — HYDROMORPHONE HCL 1 MG/ML IJ SOLN
1.0000 mg | Freq: Once | INTRAMUSCULAR | Status: AC
  Administered 2021-10-17: 1 mg via INTRAVENOUS
  Filled 2021-10-17: qty 1

## 2021-10-17 MED ORDER — SODIUM CHLORIDE 0.9 % IV BOLUS
1000.0000 mL | Freq: Once | INTRAVENOUS | Status: AC
  Administered 2021-10-17: 1000 mL via INTRAVENOUS

## 2021-10-17 MED ORDER — POTASSIUM CHLORIDE CRYS ER 20 MEQ PO TBCR
40.0000 meq | EXTENDED_RELEASE_TABLET | Freq: Once | ORAL | Status: AC
  Administered 2021-10-17: 40 meq via ORAL
  Filled 2021-10-17: qty 2

## 2021-10-17 MED ORDER — ONDANSETRON HCL 4 MG/2ML IJ SOLN
4.0000 mg | Freq: Once | INTRAMUSCULAR | Status: AC
  Administered 2021-10-17: 4 mg via INTRAVENOUS
  Filled 2021-10-17: qty 2

## 2021-10-17 NOTE — ED Provider Notes (Signed)
[facility] EMERGENCY DEPARTMENT Provider Note   CSN: [id number] Arrival date & time: 10/17/21  1824     History Chief Complaint  Patient presents with   Nausea    Anne [name] is a 40 y.o. female.  HPI     Anne [name] is a 40 y.o. female with past medical history significant for coronary artery disease, CHF, CKD stage I type 1 diabetes, and hypertension who presents to the Emergency Department requesting evaluation for nausea, vomiting, fever chills with increasing pain redness and discoloration of her left foot and toes.  States pain of her left foot radiates to the level of her knee.  No red streaking of her leg.  Redness of her foot extends to the level of her ankle.  States that [facility] foot for 1 month.  She has been taking unknown antibiotic x2 weeks.  3 to 4 days ago, she developed nausea vomiting fever and chills.  States she is unable to keep down any liquid or solid foods.  Endorses generalized weakness.  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  She denies chest pain, shortness of breath and abdominal pain.   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 [facility])    CAD (coronary artery disease) 11/13/2018   Late presentation anterior MI 12/19 >> LHC - dLM 25, mLAD 99, oOM2 100 (R-L collats), irreg RCA, EF 25-35 >> PCI: POBA to mLAD   Chronic systolic CHF (congestive heart failure) ([place]) 11/28/2018   Ischemic CM // late presentation ant MI 10/2018 tx with POBA to LAD (residual CAD with CTO of the OM2) // Echo 12/19:  No mural apical thrombus, septal, apical mid ant and inf HK; mild LVH, EF 30-35, mild MR // Echo 01/2019: EF 30-35, Gr 1 DD, diff HK, apical AK, mild MR    CKD (chronic kidney disease), stage I    Diabetes mellitus type 1 ([place])    Dyspnea    Former tobacco use     Gastroparesis    Hypercholesteremia    Hypertension    Myocardial infarction ([place]) 2019   Osteomyelitis ([place])    a. s/p R forefoot amputation.   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 11/11/2018   Osteomyelitis ([place]) 07/01/2018   Personal history of noncompliance with medical treatment, presenting hazards to health 10/28/2015   Multiple nevi 10/07/2012   Toe ulcer ([place]) 03/11/2012   Essential hypertension 03/11/2012   Type 2 diabetes mellitus with vascular disease ([place]) 02/11/2012   Diabetic neuropathy ([place]) 02/11/2012   Back pain 02/11/2012   Muscle spasm 02/11/2012   Hyperlipidemia associated with type 2 diabetes mellitus ([place]) 02/11/2012   Obesity 02/11/2012   Cellulitis in diabetic foot ([place]) 10/31/2011  Past Surgical History:  Procedure Laterality Date   AMPUTATION Right 11/03/2011   Procedure: AMPUTATION RAY;  Surgeon: Jamesetta So;  Location: [facility];  Service: General;  Laterality: Right;  Right fourth and fifth metatarsal    APPENDECTOMY     CARDIAC CATHETERIZATION  10/2018   CESAREAN SECTION  2004 and 2007   x2   CORONARY/GRAFT ACUTE MI REVASCULARIZATION N/A 11/11/2018   Procedure: CORONARY/GRAFT ACUTE MI REVASCULARIZATION;  Surgeon: Jettie Booze, MD;  Location: [place] [facility];  Service: Cardiovascular;  Laterality: N/A;   FRACTURE SURGERY  2000   INJECTION OF SILICONE OIL Right 89/38/1017   Procedure: Injection Of Silicone Oil;  Surgeon: Bernarda Caffey, MD;  Location: [place];  Service: Ophthalmology;  Laterality: Right;   LEFT HEART CATH AND CORONARY ANGIOGRAPHY N/A 11/11/2018   Procedure: LEFT HEART CATH AND CORONARY ANGIOGRAPHY;  Surgeon: Jettie Booze, MD;  Location: [place] [facility];  Service: Cardiovascular;  Laterality: N/A;   MEMBRANE PEEL Right 09/14/2019   Procedure: Antoine Primas;  Surgeon: Bernarda Caffey, MD;  Location: [place];  Service: Ophthalmology;  Laterality: Right;   PARS PLANA VITRECTOMY Right 09/14/2019   Procedure: Pars Plana Vitrectomy With 25 Gauge;  Surgeon: Bernarda Caffey, MD;  Location: [place];  Service: Ophthalmology;  Laterality: Right;   PHOTOCOAGULATION WITH LASER Right 09/14/2019   Procedure: Photocoagulation With Laser;  Surgeon: Bernarda Caffey, MD;  Location: [place];  Service: Ophthalmology;  Laterality: Right;   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     Family History  Problem Relation Age of Onset   Diabetes Father    Lung cancer Father    Alcoholism Father    Asthma Mother    Kidney disease Mother    Anemia Mother        hemolytic   Hypertension Mother    Heart attack Paternal Grandmother    Diabetes Paternal Grandmother    Diabetes Paternal Grandfather    Anesthesia problems Neg Hx    Hypotension Neg Hx    Malignant hyperthermia Neg Hx    Pseudochol deficiency Neg Hx     Social History   Tobacco Use   Smoking status: Former    Packs/day: 0.25    Years: 20.00    Pack years: 5.00    Types: Cigarettes    Quit date: 12/29/2013    Years since quitting: 7.8   Smokeless tobacco: Never  Vaping Use   Vaping Use: Never used  Substance Use Topics   Alcohol use: No   Drug use: No    Home Medications Prior to Admission medications    Medication Sig Start Date End Date Taking? Authorizing Provider  acetaminophen (TYLENOL) 500 MG tablet Take 500 mg by mouth every 4 (four) hours as needed.    [provider]  albuterol (VENTOLIN HFA) 108 (90 Base) MCG/ACT inhaler Inhale 2 puffs into the lungs every 6 (six) hours as needed for wheezing or shortness of breath.    [provider]  aspirin (GNP ASPIRIN LOW DOSE) 81 MG EC tablet Take 1 tablet (81 mg total) by mouth daily with breakfast. Swallow whole. 05/04/19   Roxan Hockey, MD  atorvastatin (LIPITOR) 80 MG tablet Take 1 tablet (80  mg total) by mouth every evening. 05/04/19   Emokpae, Courage, MD  BAQSIMI TWO PACK 3 MG/DOSE POWD Place 3 mg into the nose once as needed (for emergency low blood sugar levels).  01/24/20   [provider]  brimonidine (ALPHAGAN) 0.2 % ophthalmic solution Place 1 drop into the right eye 2 (two) times daily. 07/12/20   Bernarda Caffey, MD  BYDUREON BCISE 2 MG/0.85ML AUIJ Inject 2 mg into the skin every Thursday. 12/26/19   [provider]  carvedilol (COREG) 3.125 MG tablet Take 1 tablet (3.125 mg total) by mouth 2 (two) times daily. 01/31/21 05/01/21  Fay Records, MD  clindamycin (CLEOCIN) 150 MG capsule Take 2 capsules (300 mg total) by mouth 3 (three) times daily. May dispense as 150mg  capsules 02/12/21   Harris, Abigail, PA-C  dorzolamide-timolol (COSOPT) 22.3-6.8 MG/ML ophthalmic solution Place 1 drop into the right eye 2 (two) times daily. 07/12/20   Bernarda Caffey, MD  ezetimibe (ZETIA) 10 MG tablet TAKE 1 TABLET BY MOUTH ONCE DAILY. 03/28/21   Fay Records, MD  fenofibrate (TRICOR) 145 MG tablet Take 1 tablet (145 mg total) by mouth daily. 12/26/19   Daune Perch, NP  furosemide (LASIX) 40 MG tablet Take 1 tablet (40 mg total) by mouth 2 (two) times daily. 05/04/19 03/08/20  Roxan Hockey, MD  isosorbide mononitrate (IMDUR) 30 MG 24 hr tablet TAKE 1 TABLET BY MOUTH ONCE DAILY. 06/20/21   Fay Records, MD  LANTUS SOLOSTAR  100 UNIT/ML Solostar Pen INNJECT 50 UNITS S.Q. ONCE DAILY AT 10 P.M. Patient taking differently: Inject 50 Units into the skin at bedtime. 02/03/16   Cassandria Anger, MD  losartan (COZAAR) 25 MG tablet Take 1 tablet (25 mg total) by mouth every evening. 05/04/19   [place] Brick, Courage, MD  metFORMIN (GLUCOPHAGE) 1000 MG tablet Take 1,000 mg by mouth 2 (two) times daily with a meal.    [provider]  metoCLOPramide (REGLAN) 5 MG tablet Take 1 tablet (5 mg total) by mouth 3 (three) times daily before meals. 03/12/20   [place] Dubois, MD  nitroGLYCERIN (NITROSTAT) 0.4 MG SL tablet Place 1 tablet (0.4 mg total) under the tongue every 5 (five) minutes as needed. 05/04/19   Emokpae, Courage, MD  NOVOLOG FLEXPEN 100 UNIT/ML FlexPen INJECT 12-18 UNITS S.Q. THREE TIMES DAILY WITH MEALS. Patient taking differently: Inject 12-18 Units into the skin 3 (three) times daily with meals. 04/29/16   Cassandria Anger, MD  ondansetron (ZOFRAN ODT) 4 MG disintegrating tablet Take 1 tablet (4 mg total) by mouth every 8 (eight) hours as needed. 4mg  ODT q4 hours prn nausea/vomit 07/10/21   Mesner, Corene Cornea, MD  ondansetron (ZOFRAN) 4 MG tablet Take 1 tablet (4 mg total) by mouth every 8 (eight) hours as needed for nausea or vomiting. 07/10/21   Mesner, Corene Cornea, MD  oxyCODONE (ROXICODONE) 5 MG immediate release tablet Take 0.5-1 tablets (2.5-5 mg total) by mouth every 6 (six) hours as needed for severe pain. 02/12/21   Margarita Mail, PA-C  pantoprazole (PROTONIX) 40 MG tablet Take 1 tablet (40 mg total) by mouth daily. 03/12/20 03/12/21  [place] Dubois, MD  pregabalin (LYRICA) 75 MG capsule Take 75 mg by mouth daily. 06/27/20   [provider]  promethazine (PHENERGAN) 12.5 MG suppository Place 1 suppository (12.5 mg total) rectally as needed for refractory nausea / vomiting. 03/12/20   [place] Dubois, MD  sertraline (ZOLOFT) 100 MG tablet Take 200 mg by mouth at bedtime.  [provider]  ticagrelor  ([name]) 90 MG TABS tablet Take 1 tablet (90 mg total) by mouth 2 (two) times daily. 05/04/19   Roxan Hockey, MD  Vitamin D, Ergocalciferol, (DRISDOL) 1.25 MG (50000 UNIT) CAPS capsule Take 50,000 Units by mouth every 7 (seven) days. 06/27/20   [provider]    Allergies    Canagliflozin and Nsaids  Review of Systems   Review of Systems  Constitutional:  Positive for appetite change, chills and fever. Negative for fatigue.  Respiratory:  Negative for cough and shortness of breath.   Cardiovascular:  Negative for chest pain.  Gastrointestinal:  Positive for nausea and vomiting. Negative for abdominal pain and diarrhea.  Genitourinary:  Negative for dysuria.  Musculoskeletal:  Positive for arthralgias (Left foot and ankle pain). Negative for back pain, myalgias, neck pain and neck stiffness.  Skin:  Positive for color change (Redness, swelling of left foot, open wound of left second toe) and wound. Negative for rash.  Neurological:  Negative for dizziness, syncope, weakness, numbness and headaches.  Hematological:  Does not bruise/bleed easily.  Psychiatric/Behavioral:  Negative for confusion.   All other systems reviewed and are negative.  Physical Exam Updated Vital Signs BP 133/83   Pulse 99   Temp 98.7 F (37.1 C) (Oral)   Resp 17   Ht 5\' 9"  (1.753 m)   Wt 76.2 kg   SpO2 96%   BMI 24.81 kg/m   Physical Exam Vitals and nursing note reviewed.  Constitutional:      Appearance: She is not toxic-appearing.  Cardiovascular:     Rate and Rhythm: Normal rate and regular rhythm.     Pulses: Normal pulses.  Pulmonary:     Effort: Pulmonary effort is normal.     Breath sounds: Normal breath sounds.  Abdominal:     General: There is no distension.     Palpations: Abdomen is soft.     Tenderness: There is no abdominal tenderness.  Musculoskeletal:        General: Swelling and tenderness present.     Comments: Diffuse pain of the left foot and toes.  Tenderness of  the left ankle as well.  Open wound to the dorsal surface of the second toe with necrotic tissue at the distal tip of the toe.   erythema of the distal foot and ankle  Left lower leg soft and nontender.  No lymphangitis.  Toes are warm.  See attached photos.  Skin:    General: Skin is warm.     Findings: Erythema present.  Neurological:     General: No focal deficit present.     Mental Status: She is alert.     Sensory: No sensory deficit.     Motor: No weakness.       Patient gave verbal consent for images to be stored in medical record   ED Results / Procedures / Treatments   Labs (all labs ordered are listed, but only abnormal results are displayed) Labs Reviewed  COMPREHENSIVE METABOLIC PANEL - Abnormal; Notable for the following components:      Result Value   Sodium 128 (*)    Potassium 2.8 (*)    Chloride 86 (*)    Glucose, Bld 380 (*)    Creatinine, Ser 1.25 (*)    Total Protein 9.0 (*)    Albumin 3.3 (*)    AST 13 (*)    GFR, Estimated 56 (*)    Anion gap 17 (*)  All other components within normal limits  CBC - Abnormal; Notable for the following components:   WBC 17.6 (*)    RBC 3.83 (*)    Hemoglobin 10.0 (*)    HCT 30.8 (*)    Platelets 446 (*)    All other components within normal limits  RESP PANEL BY RT-PCR (FLU A&B, COVID) ARPGX2  LIPASE, BLOOD  HCG, QUANTITATIVE, PREGNANCY  URINALYSIS, ROUTINE W REFLEX MICROSCOPIC  LACTIC ACID, PLASMA  LACTIC ACID, PLASMA    EKG None  Radiology DG Ankle Complete Left  Result Date: 10/17/2021 CLINICAL DATA:  Initial evaluation for acute redness, pain. History of osteomyelitis. EXAM: LEFT ANKLE COMPLETE - 3+ VIEW COMPARISON:  None. FINDINGS: No acute fracture or dislocation. Ankle mortise approximated. No erosive changes to suggest acute osteomyelitis about the ankle. No visible soft tissue abnormality. Few scattered vascular calcifications noted. Age-indeterminate erosive changes about the fifth metatarsal  head, partially visualized, and better evaluated on concomitant radiograph of foot. IMPRESSION: 1. No acute osseous abnormality about the left ankle. No radiographic evidence for acute osteomyelitis. 2. Age-indeterminate erosive changes about the fifth metatarsal head, better evaluated on concomitant radiograph of the foot. Electronically Signed   By: Jeannine Boga M.D.   On: 10/17/2021 23:50   DG Foot Complete Left  Result Date: 10/17/2021 CLINICAL DATA:  Initial evaluation for redness, swelling. History of osteomyelitis. EXAM: LEFT FOOT - COMPLETE 3+ VIEW COMPARISON:  Prior radiograph from 07/01/2018. FINDINGS: There has been progressive osseous erosion about the fifth metatarsal head since previous exam. Progressive erosion and/or postsurgical changes seen about the left fifth proximal, middle, and distal phalanges as well. Finding of uncertain acuity, and could reflect progressive but chronic changes of osteomyelitis. There is additional osseous erosion centered about the left second PIP joint, concerning for osteomyelitis. Erosive changes with lucency extends to involve the adjacent second middle and proximal phalanges. Overlying soft tissue swelling. No visible soft tissue emphysema. Possible superimposed oblique fracture extending through the base of the left second proximal phalanx with intra-articular extension noted. No other acute osseous abnormality about the foot. 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 Small posterior calcaneal enthesophyte. No other soft tissue abnormality. Scattered vascular calcifications noted. IMPRESSION: 1. Osseous erosive changes centered about the left second PIP joint, concerning for acute osteomyelitis. 2. Possible superimposed oblique fracture extending through the base of the left second proximal phalanx with intra-articular extension. 3. Progressive osseous erosion about the left fifth metatarsal head as well as the fifth proximal, middle, and  distal phalanges. Finding is of uncertain acuity, and could reflect progressive but chronic changes of osteomyelitis. Electronically Signed   By: Jeannine Boga M.D.   On: 10/17/2021 23:48    Procedures Procedures   Medications Ordered in ED Medications  ondansetron [facility]) injection 4 mg (4 mg Intravenous Given 10/17/21 2136)  sodium chloride 0.9 % bolus 1,000 mL (0 mLs Intravenous Stopped 10/17/21 2244)  HYDROmorphone (DILAUDID) injection 1 mg (1 mg Intravenous Given 10/17/21 2301)    ED Course  I have reviewed the triage vital signs and the nursing notes.  Pertinent labs & imaging results that were available during my care of the patient were reviewed by me and considered in my medical decision making (see chart for details).    MDM Rules/Calculators/A&P                           Patient here with reported nausea vomiting, fever chills, redness and increasing  pain of the left foot and toes.  Necrosis of the distal second toe.  Symptoms have been progressing for several days.  She has been treated with antibiotics x2 weeks for osteomyelitis, [facility]  On exam, patient nontoxic-appearing.  Obvious infection of left foot.  No lymphangitis.   No active vomiting in the department.  Given IV fluids and antiemetic prior to my evaluation.  Will obtain labs, x-ray of the foot and ankle.   She will likely need hospital admission and surgical consultation.  Labs interpreted by me, hypokalemia with potassium of 2.8, blood sugar 380.  Creatinine 1.25 improved from baseline, leukocytosis of 17,000.  Lactic acid unremarkable.  IV and p.o. potassium ordered.  X-rays of the left foot show acute osteomyelitis of the second toe.  X-ray of the ankle without acute findings.  Discussed findings with [facility] hospitalist, Dr. Clearence Ped who agrees to admit.    Final Clinical Impression(s) / ED Diagnoses Final diagnoses:  Osteomyelitis of second toe of left foot  ([place])  Hypokalemia    Rx / DC Orders ED Discharge Orders     None        Kem Parkinson, PA-C 10/18/21 0018    Fredia Sorrow, MD 10/23/21 0002

## 2021-10-17 NOTE — ED Triage Notes (Addendum)
Patient reports N/V for the past four days. States that she has recently started ABT for wound infection to bilateral feet and has been unable to keep down and wounds are not improving and feel warm with redness extending to leg. No BM recently. Denies abdominal pain. Patient states that she is getting dizzy when standing.

## 2021-10-18 ENCOUNTER — Inpatient Hospital Stay (HOSPITAL_COMMUNITY): Payer: Medicare Other

## 2021-10-18 ENCOUNTER — Encounter (HOSPITAL_COMMUNITY): Payer: Self-pay | Admitting: Family Medicine

## 2021-10-18 ENCOUNTER — Other Ambulatory Visit (HOSPITAL_COMMUNITY): Payer: Self-pay | Admitting: *Deleted

## 2021-10-18 DIAGNOSIS — I130 Hypertensive heart and chronic kidney disease with heart failure and stage 1 through stage 4 chronic kidney disease, or unspecified chronic kidney disease: Secondary | ICD-10-CM | POA: Diagnosis present

## 2021-10-18 DIAGNOSIS — Z801 Family history of malignant neoplasm of trachea, bronchus and lung: Secondary | ICD-10-CM | POA: Diagnosis not present

## 2021-10-18 DIAGNOSIS — L97529 Non-pressure chronic ulcer of other part of left foot with unspecified severity: Secondary | ICD-10-CM | POA: Diagnosis present

## 2021-10-18 DIAGNOSIS — E1143 Type 2 diabetes mellitus with diabetic autonomic (poly)neuropathy: Secondary | ICD-10-CM | POA: Diagnosis present

## 2021-10-18 DIAGNOSIS — Z833 Family history of diabetes mellitus: Secondary | ICD-10-CM | POA: Diagnosis not present

## 2021-10-18 DIAGNOSIS — E1122 Type 2 diabetes mellitus with diabetic chronic kidney disease: Secondary | ICD-10-CM | POA: Diagnosis present

## 2021-10-18 DIAGNOSIS — R112 Nausea with vomiting, unspecified: Secondary | ICD-10-CM | POA: Diagnosis not present

## 2021-10-18 DIAGNOSIS — I5022 Chronic systolic (congestive) heart failure: Secondary | ICD-10-CM

## 2021-10-18 DIAGNOSIS — K3184 Gastroparesis: Secondary | ICD-10-CM | POA: Diagnosis present

## 2021-10-18 DIAGNOSIS — E1169 Type 2 diabetes mellitus with other specified complication: Secondary | ICD-10-CM | POA: Diagnosis present

## 2021-10-18 DIAGNOSIS — E11621 Type 2 diabetes mellitus with foot ulcer: Secondary | ICD-10-CM | POA: Diagnosis present

## 2021-10-18 DIAGNOSIS — E785 Hyperlipidemia, unspecified: Secondary | ICD-10-CM

## 2021-10-18 DIAGNOSIS — Z01810 Encounter for preprocedural cardiovascular examination: Secondary | ICD-10-CM | POA: Diagnosis not present

## 2021-10-18 DIAGNOSIS — E1142 Type 2 diabetes mellitus with diabetic polyneuropathy: Secondary | ICD-10-CM | POA: Diagnosis present

## 2021-10-18 DIAGNOSIS — W19XXXA Unspecified fall, initial encounter: Secondary | ICD-10-CM | POA: Diagnosis not present

## 2021-10-18 DIAGNOSIS — Z888 Allergy status to other drugs, medicaments and biological substances status: Secondary | ICD-10-CM | POA: Diagnosis not present

## 2021-10-18 DIAGNOSIS — I10 Essential (primary) hypertension: Secondary | ICD-10-CM | POA: Diagnosis not present

## 2021-10-18 DIAGNOSIS — I252 Old myocardial infarction: Secondary | ICD-10-CM | POA: Diagnosis not present

## 2021-10-18 DIAGNOSIS — E1152 Type 2 diabetes mellitus with diabetic peripheral angiopathy with gangrene: Secondary | ICD-10-CM | POA: Diagnosis present

## 2021-10-18 DIAGNOSIS — Z20822 Contact with and (suspected) exposure to covid-19: Secondary | ICD-10-CM | POA: Diagnosis present

## 2021-10-18 DIAGNOSIS — E876 Hypokalemia: Secondary | ICD-10-CM | POA: Diagnosis present

## 2021-10-18 DIAGNOSIS — D631 Anemia in chronic kidney disease: Secondary | ICD-10-CM | POA: Diagnosis present

## 2021-10-18 DIAGNOSIS — M86172 Other acute osteomyelitis, left ankle and foot: Secondary | ICD-10-CM | POA: Diagnosis present

## 2021-10-18 DIAGNOSIS — E11628 Type 2 diabetes mellitus with other skin complications: Secondary | ICD-10-CM | POA: Diagnosis present

## 2021-10-18 DIAGNOSIS — I2510 Atherosclerotic heart disease of native coronary artery without angina pectoris: Secondary | ICD-10-CM | POA: Diagnosis present

## 2021-10-18 DIAGNOSIS — N181 Chronic kidney disease, stage 1: Secondary | ICD-10-CM | POA: Diagnosis present

## 2021-10-18 DIAGNOSIS — M869 Osteomyelitis, unspecified: Secondary | ICD-10-CM

## 2021-10-18 DIAGNOSIS — D509 Iron deficiency anemia, unspecified: Secondary | ICD-10-CM | POA: Diagnosis present

## 2021-10-18 DIAGNOSIS — I5020 Unspecified systolic (congestive) heart failure: Secondary | ICD-10-CM | POA: Diagnosis not present

## 2021-10-18 DIAGNOSIS — L03116 Cellulitis of left lower limb: Secondary | ICD-10-CM | POA: Diagnosis present

## 2021-10-18 DIAGNOSIS — E1159 Type 2 diabetes mellitus with other circulatory complications: Secondary | ICD-10-CM

## 2021-10-18 LAB — URINALYSIS, ROUTINE W REFLEX MICROSCOPIC
Bilirubin Urine: NEGATIVE
Glucose, UA: >=500 mg/dL — AB
Ketones, ur: NEGATIVE mg/dL
Leukocytes,Ua: NEGATIVE
Nitrite: NEGATIVE
Protein, ur: 100 mg/dL — AB
Specific Gravity, Urine: 1.025 (ref 1.005–1.030)
pH: 5.5 (ref 5.0–8.0)

## 2021-10-18 LAB — CBC WITH DIFFERENTIAL/PLATELET
Abs Immature Granulocytes: 0.04 10*3/uL (ref 0.00–0.07)
Basophils Absolute: 0 10*3/uL (ref 0.0–0.1)
Basophils Relative: 0 %
Eosinophils Absolute: 0 10*3/uL (ref 0.0–0.5)
Eosinophils Relative: 0 %
HCT: 24.2 % — ABNORMAL LOW (ref 36.0–46.0)
Hemoglobin: 7.9 g/dL — ABNORMAL LOW (ref 12.0–15.0)
Immature Granulocytes: 0 %
Lymphocytes Relative: 8 %
Lymphs Abs: 1 10*3/uL (ref 0.7–4.0)
MCH: 26.1 pg (ref 26.0–34.0)
MCHC: 32.6 g/dL (ref 30.0–36.0)
MCV: 79.9 fL — ABNORMAL LOW (ref 80.0–100.0)
Monocytes Absolute: 0.8 10*3/uL (ref 0.1–1.0)
Monocytes Relative: 6 %
Neutro Abs: 11.6 10*3/uL — ABNORMAL HIGH (ref 1.7–7.7)
Neutrophils Relative %: 86 %
Platelets: 362 10*3/uL (ref 150–400)
RBC: 3.03 MIL/uL — ABNORMAL LOW (ref 3.87–5.11)
RDW: 13.6 % (ref 11.5–15.5)
WBC: 13.5 10*3/uL — ABNORMAL HIGH (ref 4.0–10.5)
nRBC: 0 % (ref 0.0–0.2)

## 2021-10-18 LAB — COMPREHENSIVE METABOLIC PANEL
ALT: 11 U/L (ref 0–44)
AST: 13 U/L — ABNORMAL LOW (ref 15–41)
Albumin: 2.7 g/dL — ABNORMAL LOW (ref 3.5–5.0)
Alkaline Phosphatase: 65 U/L (ref 38–126)
Anion gap: 12 (ref 5–15)
BUN: 14 mg/dL (ref 6–20)
CO2: 25 mmol/L (ref 22–32)
Calcium: 7.9 mg/dL — ABNORMAL LOW (ref 8.9–10.3)
Chloride: 92 mmol/L — ABNORMAL LOW (ref 98–111)
Creatinine, Ser: 1.09 mg/dL — ABNORMAL HIGH (ref 0.44–1.00)
GFR, Estimated: >60 mL/min (ref >60)
Glucose, Bld: 410 mg/dL — ABNORMAL HIGH (ref 70–99)
Potassium: 3.1 mmol/L — ABNORMAL LOW (ref 3.5–5.1)
Sodium: 129 mmol/L — ABNORMAL LOW (ref 135–145)
Total Bilirubin: 0.5 mg/dL (ref 0.3–1.2)
Total Protein: 7.1 g/dL (ref 6.5–8.1)

## 2021-10-18 LAB — GLUCOSE, CAPILLARY
Glucose-Capillary: 330 mg/dL — ABNORMAL HIGH (ref 70–99)
Glucose-Capillary: 225 mg/dL — ABNORMAL HIGH (ref 70–99)
Glucose-Capillary: 264 mg/dL — ABNORMAL HIGH (ref 70–99)
Glucose-Capillary: 226 mg/dL — ABNORMAL HIGH (ref 70–99)
Glucose-Capillary: 141 mg/dL — ABNORMAL HIGH (ref 70–99)

## 2021-10-18 LAB — C-REACTIVE PROTEIN: CRP: 23.1 mg/dL — ABNORMAL HIGH (ref <1.0)

## 2021-10-18 LAB — LACTIC ACID, PLASMA: Lactic Acid, Venous: 1.3 mmol/L (ref 0.5–1.9)

## 2021-10-18 LAB — URINALYSIS, MICROSCOPIC (REFLEX)

## 2021-10-18 LAB — SEDIMENTATION RATE: Sed Rate: >140 mm/hr — ABNORMAL HIGH (ref 0–22)

## 2021-10-18 LAB — ECHOCARDIOGRAM COMPLETE
Area-P 1/2: 4.06 cm2
Height: 69 in
S' Lateral: 3.1 cm
Weight: 2680.79 oz

## 2021-10-18 LAB — RESP PANEL BY RT-PCR (FLU A&B, COVID) ARPGX2
Influenza A by PCR: NEGATIVE
Influenza B by PCR: NEGATIVE
SARS Coronavirus 2 by RT PCR: NEGATIVE

## 2021-10-18 LAB — MAGNESIUM: Magnesium: 1.4 mg/dL — ABNORMAL LOW (ref 1.7–2.4)

## 2021-10-18 LAB — HIV ANTIBODY (ROUTINE TESTING W REFLEX): HIV Screen 4th Generation wRfx: NONREACTIVE

## 2021-10-18 MED ORDER — MAGNESIUM SULFATE 2 GM/50ML IV SOLN
2.0000 g | Freq: Once | INTRAVENOUS | Status: AC
  Administered 2021-10-18: 2 g via INTRAVENOUS

## 2021-10-18 MED ORDER — SODIUM CHLORIDE 0.9 % IV SOLN
2.0000 g | Freq: Three times a day (TID) | INTRAVENOUS | Status: AC
  Administered 2021-10-18 – 2021-10-24 (×20): 2 g via INTRAVENOUS
  Filled 2021-10-18 (×21): qty 2

## 2021-10-18 MED ORDER — FENOFIBRATE 54 MG PO TABS
54.0000 mg | ORAL_TABLET | Freq: Every day | ORAL | Status: DC
  Administered 2021-10-18 – 2021-10-28 (×11): 54 mg via ORAL
  Filled 2021-10-18 (×12): qty 1

## 2021-10-18 MED ORDER — INSULIN ASPART 100 UNIT/ML IJ SOLN
15.0000 [IU] | Freq: Three times a day (TID) | INTRAMUSCULAR | Status: DC
  Administered 2021-10-18 – 2021-10-25 (×4): 15 [IU] via SUBCUTANEOUS

## 2021-10-18 MED ORDER — INSULIN ASPART 100 UNIT/ML IJ SOLN
0.0000 [IU] | Freq: Three times a day (TID) | INTRAMUSCULAR | Status: DC
  Administered 2021-10-18: 8 [IU] via SUBCUTANEOUS
  Administered 2021-10-18 – 2021-10-19 (×3): 5 [IU] via SUBCUTANEOUS
  Administered 2021-10-19 – 2021-10-20 (×2): 3 [IU] via SUBCUTANEOUS
  Administered 2021-10-20: 19:00:00 8 [IU] via SUBCUTANEOUS
  Administered 2021-10-21: 2 [IU] via SUBCUTANEOUS
  Administered 2021-10-21 (×2): 3 [IU] via SUBCUTANEOUS
  Administered 2021-10-22: 2 [IU] via SUBCUTANEOUS
  Administered 2021-10-22 – 2021-10-23 (×3): 3 [IU] via SUBCUTANEOUS
  Administered 2021-10-24: 2 [IU] via SUBCUTANEOUS

## 2021-10-18 MED ORDER — OXYCODONE HCL 5 MG PO TABS
5.0000 mg | ORAL_TABLET | ORAL | Status: DC | PRN
  Administered 2021-10-18 – 2021-10-27 (×25): 5 mg via ORAL
  Filled 2021-10-18 (×25): qty 1

## 2021-10-18 MED ORDER — LOSARTAN POTASSIUM 50 MG PO TABS
25.0000 mg | ORAL_TABLET | Freq: Every evening | ORAL | Status: DC
  Administered 2021-10-18 – 2021-10-27 (×10): 25 mg via ORAL
  Filled 2021-10-18 (×10): qty 1

## 2021-10-18 MED ORDER — INSULIN ASPART 100 UNIT/ML IJ SOLN
0.0000 [IU] | Freq: Every day | INTRAMUSCULAR | Status: DC
  Administered 2021-10-18: 4 [IU] via SUBCUTANEOUS
  Administered 2021-10-23: 2 [IU] via SUBCUTANEOUS
  Administered 2021-10-24: 5 [IU] via SUBCUTANEOUS

## 2021-10-18 MED ORDER — CARVEDILOL 3.125 MG PO TABS
3.1250 mg | ORAL_TABLET | Freq: Two times a day (BID) | ORAL | Status: DC
  Administered 2021-10-18 – 2021-10-28 (×22): 3.125 mg via ORAL
  Filled 2021-10-18 (×22): qty 1

## 2021-10-18 MED ORDER — SERTRALINE HCL 50 MG PO TABS
200.0000 mg | ORAL_TABLET | Freq: Every day | ORAL | Status: DC
  Administered 2021-10-18 – 2021-10-27 (×11): 200 mg via ORAL
  Filled 2021-10-18 (×12): qty 4

## 2021-10-18 MED ORDER — ACETAMINOPHEN 325 MG PO TABS
650.0000 mg | ORAL_TABLET | Freq: Four times a day (QID) | ORAL | Status: DC | PRN
  Administered 2021-10-25 (×2): 650 mg via ORAL
  Filled 2021-10-18 (×2): qty 2

## 2021-10-18 MED ORDER — ACETAMINOPHEN 650 MG RE SUPP: 650.0000 mg | Freq: Four times a day (QID) | RECTAL | Status: DC | PRN

## 2021-10-18 MED ORDER — PREGABALIN 75 MG PO CAPS
75.0000 mg | ORAL_CAPSULE | Freq: Every day | ORAL | Status: DC
  Administered 2021-10-18 – 2021-10-28 (×11): 75 mg via ORAL
  Filled 2021-10-18 (×11): qty 1

## 2021-10-18 MED ORDER — ATORVASTATIN CALCIUM 40 MG PO TABS
80.0000 mg | ORAL_TABLET | Freq: Every evening | ORAL | Status: DC
  Administered 2021-10-18 – 2021-10-27 (×10): 80 mg via ORAL
  Filled 2021-10-18 (×10): qty 2

## 2021-10-18 MED ORDER — TICAGRELOR 90 MG PO TABS: 90.0000 mg | ORAL_TABLET | Freq: Two times a day (BID) | ORAL | Status: DC

## 2021-10-18 MED ORDER — MORPHINE SULFATE (PF) 2 MG/ML IV SOLN
2.0000 mg | INTRAVENOUS | Status: DC | PRN
  Administered 2021-10-18 – 2021-10-22 (×5): 2 mg via INTRAVENOUS
  Filled 2021-10-18 (×5): qty 1

## 2021-10-18 MED ORDER — VANCOMYCIN HCL 1500 MG/300ML IV SOLN
1500.0000 mg | Freq: Every day | INTRAVENOUS | Status: DC
  Administered 2021-10-18 – 2021-10-19 (×3): 1500 mg via INTRAVENOUS
  Filled 2021-10-18 (×3): qty 300

## 2021-10-18 MED ORDER — HEPARIN SODIUM (PORCINE) 5000 UNIT/ML IJ SOLN
5000.0000 [IU] | Freq: Three times a day (TID) | INTRAMUSCULAR | Status: DC
  Administered 2021-10-18 – 2021-10-28 (×28): 5000 [IU] via SUBCUTANEOUS
  Filled 2021-10-18 (×27): qty 1

## 2021-10-18 MED ORDER — METOCLOPRAMIDE HCL 5 MG/ML IJ SOLN
10.0000 mg | Freq: Three times a day (TID) | INTRAMUSCULAR | Status: DC | PRN
  Administered 2021-10-18: 10 mg via INTRAVENOUS
  Filled 2021-10-18 (×2): qty 2

## 2021-10-18 MED ORDER — POTASSIUM CHLORIDE CRYS ER 20 MEQ PO TBCR
40.0000 meq | EXTENDED_RELEASE_TABLET | Freq: Two times a day (BID) | ORAL | Status: AC
  Administered 2021-10-18 – 2021-10-19 (×4): 40 meq via ORAL
  Filled 2021-10-18 (×3): qty 2
  Filled 2021-10-18: qty 4

## 2021-10-18 MED ORDER — INSULIN DETEMIR 100 UNIT/ML ~~LOC~~ SOLN
40.0000 [IU] | Freq: Every day | SUBCUTANEOUS | Status: DC
  Administered 2021-10-18 – 2021-10-25 (×8): 40 [IU] via SUBCUTANEOUS
  Filled 2021-10-18 (×9): qty 0.4

## 2021-10-18 MED ORDER — INSULIN DETEMIR 100 UNIT/ML ~~LOC~~ SOLN
30.0000 [IU] | Freq: Every day | SUBCUTANEOUS | Status: DC
  Filled 2021-10-18 (×3): qty 0.3

## 2021-10-18 MED ORDER — ALBUTEROL SULFATE HFA 108 (90 BASE) MCG/ACT IN AERS: 2.0000 | INHALATION_SPRAY | Freq: Four times a day (QID) | RESPIRATORY_TRACT | Status: DC | PRN

## 2021-10-18 MED ORDER — SODIUM CHLORIDE 0.9 % IV SOLN
12.5000 mg | Freq: Four times a day (QID) | INTRAVENOUS | Status: DC | PRN
  Administered 2021-10-18 – 2021-10-25 (×11): 12.5 mg via INTRAVENOUS
  Filled 2021-10-18 (×8): qty 0.5

## 2021-10-18 MED ORDER — ALBUTEROL SULFATE (2.5 MG/3ML) 0.083% IN NEBU: 2.5000 mg | INHALATION_SOLUTION | Freq: Four times a day (QID) | RESPIRATORY_TRACT | Status: DC | PRN

## 2021-10-18 MED ORDER — ASPIRIN EC 81 MG PO TBEC
81.0000 mg | DELAYED_RELEASE_TABLET | Freq: Every day | ORAL | Status: DC
  Administered 2021-10-18 – 2021-10-28 (×11): 81 mg via ORAL
  Filled 2021-10-18 (×11): qty 1

## 2021-10-18 MED ORDER — METRONIDAZOLE 500 MG PO TABS
500.0000 mg | ORAL_TABLET | Freq: Two times a day (BID) | ORAL | Status: AC
  Administered 2021-10-18 – 2021-10-23 (×14): 500 mg via ORAL
  Filled 2021-10-18 (×13): qty 1

## 2021-10-18 MED ORDER — EZETIMIBE 10 MG PO TABS
10.0000 mg | ORAL_TABLET | Freq: Every day | ORAL | Status: DC
  Administered 2021-10-18 – 2021-10-28 (×11): 10 mg via ORAL
  Filled 2021-10-18 (×11): qty 1

## 2021-10-18 MED ORDER — ISOSORBIDE MONONITRATE ER 60 MG PO TB24
30.0000 mg | ORAL_TABLET | Freq: Every day | ORAL | Status: DC
  Administered 2021-10-18 – 2021-10-28 (×12): 30 mg via ORAL
  Filled 2021-10-18 (×11): qty 1

## 2021-10-18 NOTE — Progress Notes (Signed)
Patient seen and examined.  Examined with general surgeon at the bedside.  Presented with grossly infected and ischemic left foot diabetic abscess and osteomyelitis.  Cellulitis extending up to the ankles.  Anticipating below-knee amputation after vascular studies and stabilization and perioperative blood glucose stabilization.  Plan: Continue broad-spectrum antibiotics with cefepime and vancomycin and Flagyl. ABI today, surgery following. Adequate pain medications and nausea medications. Uptitrate insulin doses, increase dose of long-acting insulin and add prandial insulin to try to achieve good blood sugar control.  Last known A1c 11. She does have history of coronary artery disease, no recent coronary stents but on aspirin and [name].  Last dose of [name] 11/25. Discontinue [name], continue aspirin. Will ask for cardiology consultation on 11/28 before proceeding for surgery.  Will order 2D echocardiogram today, last ejection fraction was 35%.  Total time spent: 30 minutes.  No charge visit.

## 2021-10-18 NOTE — Progress Notes (Signed)
*  PRELIMINARY RESULTS* Echocardiogram 2D Echocardiogram has been performed.  Samuel Germany 10/18/2021, 3:46 PM

## 2021-10-18 NOTE — Consult Note (Signed)
Reason for Consult: Cellulitis/osteomyelitis of left foot Referring Physician: Dr. Briant Cedar is an 40 y.o. female.  HPI: Patient is a 40 year old white female with multiple medical problems including peripheral vascular disease, status post right transmetatarsal amputation in 2018, chronic kidney disease, congestive heart failure, diabetes mellitus who presented to the hospital with a worsening left foot infection.  She had been seen by her podiatrist recently and stated she had some tissue removed by him in the office.  She had been on an antibiotic for several weeks.  Over the past 3 to 4 days, her foot worsened and she presented to the emergency room.  She does have diabetic neuropathy.  Her blood sugar was noted to be over 400.  She has been on [name] for her congestive heart failure.  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 [facility])    CAD (coronary artery disease) 11/13/2018   Late presentation anterior MI 12/19 >> LHC - dLM 25, mLAD 99, oOM2 100 (R-L collats), irreg RCA, EF 25-35 >> PCI: POBA to mLAD   Chronic systolic CHF (congestive heart failure) ([place]) 11/28/2018   Ischemic CM // late presentation ant MI 10/2018 tx with POBA to LAD (residual CAD with CTO of the OM2) // Echo 12/19:  No mural apical thrombus, septal, apical mid ant and inf HK; mild LVH, EF 30-35, mild MR // Echo 01/2019: EF 30-35, Gr 1 DD, diff HK, apical AK, mild MR    CKD (chronic kidney disease), stage I    Diabetes mellitus type 1 ([place])    Dyspnea    Former tobacco use    Gastroparesis    Hypercholesteremia    Hypertension    Myocardial infarction ([place]) 2019   Osteomyelitis ([place])    a. s/p R forefoot amputation.   Renal failure    Sciatica     Past Surgical History:  Procedure Laterality Date   AMPUTATION Right 11/03/2011   Procedure: AMPUTATION RAY;  Surgeon: Jamesetta So;  Location: [facility];   Service: General;  Laterality: Right;  Right fourth and fifth metatarsal    APPENDECTOMY     CARDIAC CATHETERIZATION  10/2018   CESAREAN SECTION  2004 and 2007   x2   CORONARY/GRAFT ACUTE MI REVASCULARIZATION N/A 11/11/2018   Procedure: CORONARY/GRAFT ACUTE MI REVASCULARIZATION;  Surgeon: Jettie Booze, MD;  Location: [place] [facility];  Service: Cardiovascular;  Laterality: N/A;   FRACTURE SURGERY  2000   INJECTION OF SILICONE OIL Right 25/42/7062   Procedure: Injection Of Silicone Oil;  Surgeon: Bernarda Caffey, MD;  Location: [place];  Service: Ophthalmology;  Laterality: Right;   LEFT HEART CATH AND CORONARY ANGIOGRAPHY N/A 11/11/2018   Procedure: LEFT HEART CATH AND CORONARY ANGIOGRAPHY;  Surgeon: Jettie Booze, MD;  Location: [place] [facility];  Service: Cardiovascular;  Laterality: N/A;   MEMBRANE PEEL Right 09/14/2019   Procedure: Antoine Primas;  Surgeon: Bernarda Caffey, MD;  Location: [place];  Service: Ophthalmology;  Laterality: Right;   PARS PLANA VITRECTOMY Right 09/14/2019   Procedure: Pars Plana Vitrectomy With 25 Gauge;  Surgeon: Bernarda Caffey, MD;  Location: [place];  Service: Ophthalmology;  Laterality: Right;   PHOTOCOAGULATION WITH LASER Right 09/14/2019   Procedure: Photocoagulation With Laser;  Surgeon: Bernarda Caffey, MD;  Location: [place];  Service: Ophthalmology;  Laterality: Right;  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   Procedure: REPAIR OF COMPLEX TRACTION RETINAL DETACHMENT;  Surgeon: Bernarda Caffey, MD;  Location: [place];  Service: Ophthalmology;  Laterality: Right;   TUBAL LIGATION      Family History  Problem Relation Age of Onset   Diabetes Father    Lung cancer Father    Alcoholism Father    Asthma Mother    Kidney disease Mother    Anemia Mother        hemolytic   Hypertension Mother    Heart attack Paternal Grandmother    Diabetes Paternal Grandmother    Diabetes Paternal Grandfather    Anesthesia problems Neg Hx     Hypotension Neg Hx    Malignant hyperthermia Neg Hx    Pseudochol deficiency Neg Hx     Social History:  reports that she quit smoking about 7 years ago. Her smoking use included cigarettes. She has a 5.00 pack-year smoking history. She has never used smokeless tobacco. She reports that she does not drink alcohol and does not use drugs.  Allergies:  Allergies  Allergen Reactions   Canagliflozin Other (See Comments)    Vaginal yeast infections   Nsaids Other (See Comments)    Yeast infection     Medications: I have reviewed the patient's current medications. Prior to Admission:  Medications Prior to Admission  Medication Sig Dispense Refill Last Dose   acetaminophen (TYLENOL) 500 MG tablet Take 500 mg by mouth every 4 (four) hours as needed.      albuterol (VENTOLIN HFA) 108 (90 Base) MCG/ACT inhaler Inhale 2 puffs into the lungs every 6 (six) hours as needed for wheezing or shortness of breath.      aspirin (GNP ASPIRIN LOW DOSE) 81 MG EC tablet Take 1 tablet (81 mg total) by mouth daily with breakfast. Swallow whole. 90 tablet 3    atorvastatin (LIPITOR) 80 MG tablet Take 1 tablet (80 mg total) by mouth every evening. 30 tablet 6    BAQSIMI TWO PACK 3 MG/DOSE POWD Place 3 mg into the nose once as needed (for emergency low blood sugar levels).       brimonidine (ALPHAGAN) 0.2 % ophthalmic solution Place 1 drop into the right eye 2 (two) times daily. 15 mL 4    BYDUREON BCISE 2 MG/0.85ML AUIJ Inject 2 mg into the skin every Thursday.      carvedilol (COREG) 3.125 MG tablet Take 1 tablet (3.125 mg total) by mouth 2 (two) times daily. 180 tablet 3    clindamycin (CLEOCIN) 150 MG capsule Take 2 capsules (300 mg total) by mouth 3 (three) times daily. May dispense as 150mg  capsules 60 capsule 0    dorzolamide-timolol (COSOPT) 22.3-6.8 MG/ML ophthalmic solution Place 1 drop into the right eye 2 (two) times daily. 10 mL 4    ezetimibe (ZETIA) 10 MG tablet TAKE 1 TABLET BY MOUTH ONCE DAILY.  30 tablet 11    fenofibrate (TRICOR) 145 MG tablet Take 1 tablet (145 mg total) by mouth daily. 90 tablet 3    furosemide (LASIX) 40 MG tablet Take 1 tablet (40 mg total) by mouth 2 (two) times daily. 180 tablet 1    isosorbide mononitrate (IMDUR) 30 MG 24 hr tablet TAKE 1 TABLET BY MOUTH ONCE DAILY. 28 tablet 11    LANTUS SOLOSTAR 100 UNIT/ML Solostar Pen INNJECT 50 UNITS S.Q. ONCE DAILY AT 10 P.M. (Patient taking differently: Inject 50 Units into the skin at bedtime.) 15 mL 2  losartan (COZAAR) 25 MG tablet Take 1 tablet (25 mg total) by mouth every evening. 30 tablet 5    metFORMIN (GLUCOPHAGE) 1000 MG tablet Take 1,000 mg by mouth 2 (two) times daily with a meal.      metoCLOPramide (REGLAN) 5 MG tablet Take 1 tablet (5 mg total) by mouth 3 (three) times daily before meals. 90 tablet 1    nitroGLYCERIN (NITROSTAT) 0.4 MG SL tablet Place 1 tablet (0.4 mg total) under the tongue every 5 (five) minutes as needed. 25 tablet 12    NOVOLOG FLEXPEN 100 UNIT/ML FlexPen INJECT 12-18 UNITS S.Q. THREE TIMES DAILY WITH MEALS. (Patient taking differently: Inject 12-18 Units into the skin 3 (three) times daily with meals.) 15 mL 3    ondansetron (ZOFRAN ODT) 4 MG disintegrating tablet Take 1 tablet (4 mg total) by mouth every 8 (eight) hours as needed. 4mg  ODT q4 hours prn nausea/vomit 30 tablet 0    ondansetron (ZOFRAN) 4 MG tablet Take 1 tablet (4 mg total) by mouth every 8 (eight) hours as needed for nausea or vomiting. 4 tablet 0    oxyCODONE (ROXICODONE) 5 MG immediate release tablet Take 0.5-1 tablets (2.5-5 mg total) by mouth every 6 (six) hours as needed for severe pain. 6 tablet 0    pantoprazole (PROTONIX) 40 MG tablet Take 1 tablet (40 mg total) by mouth daily. 30 tablet 1    pregabalin (LYRICA) 75 MG capsule Take 75 mg by mouth daily.      promethazine (PHENERGAN) 12.5 MG suppository Place 1 suppository (12.5 mg total) rectally as needed for refractory nausea / vomiting. 20 each 0    sertraline  (ZOLOFT) 100 MG tablet Take 200 mg by mouth at bedtime.       ticagrelor ([name]) 90 MG TABS tablet Take 1 tablet (90 mg total) by mouth 2 (two) times daily. 60 tablet 11    Vitamin D, Ergocalciferol, (DRISDOL) 1.25 MG (50000 UNIT) CAPS capsule Take 50,000 Units by mouth every 7 (seven) days.       Results for orders placed or performed during the hospital encounter of 10/17/21 (from the past 48 hour(s))  Lipase, blood     Status: None   Collection Time: 10/17/21  6:48 PM  Result Value Ref Range   Lipase 19 11 - 51 U/L    Comment: Performed at [facility], [address], [place], [place] [address]  Comprehensive metabolic panel     Status: Abnormal   Collection Time: 10/17/21  6:48 PM  Result Value Ref Range   Sodium 128 (L) 135 - 145 mmol/L   Potassium 2.8 (L) 3.5 - 5.1 mmol/L   Chloride 86 (L) 98 - 111 mmol/L   CO2 25 22 - 32 mmol/L   Glucose, Bld 380 (H) 70 - 99 mg/dL    Comment: Glucose reference range applies only to samples taken after fasting for at least 8 hours.   BUN 17 6 - 20 mg/dL   Creatinine, Ser 1.25 (H) 0.44 - 1.00 mg/dL   Calcium 8.9 8.9 - 10.3 mg/dL   Total Protein 9.0 (H) 6.5 - 8.1 g/dL   Albumin 3.3 (L) 3.5 - 5.0 g/dL   AST 13 (L) 15 - 41 U/L   ALT 12 0 - 44 U/L   Alkaline Phosphatase 77 38 - 126 U/L   Total Bilirubin 1.0 0.3 - 1.2 mg/dL   GFR, Estimated 56 (L) >60 mL/min    Comment: (NOTE) Calculated using the CKD-EPI Creatinine Equation (2021)  Anion gap 17 (H) 5 - 15    Comment: Performed at [facility], [address], [place], [place] [address]  CBC     Status: Abnormal   Collection Time: 10/17/21  6:48 PM  Result Value Ref Range   WBC 17.6 (H) 4.0 - 10.5 K/uL   RBC 3.83 (L) 3.87 - 5.11 MIL/uL   Hemoglobin 10.0 (L) 12.0 - 15.0 g/dL   HCT 30.8 (L) 36.0 - 46.0 %   MCV 80.4 80.0 - 100.0 fL   MCH 26.1 26.0 - 34.0 pg   MCHC 32.5 30.0 - 36.0 g/dL   RDW 13.7 11.5 - 15.5 %   Platelets 446 (H) 150 - 400 K/uL   nRBC 0.0 0.0 - 0.2 %    Comment:  Performed at [facility], [address], [place], [place] [address]  hCG, quantitative, pregnancy     Status: None   Collection Time: 10/17/21  6:48 PM  Result Value Ref Range   hCG, Beta Chain, Quant, S <1 <5 mIU/mL    Comment:          GEST. AGE      CONC.  (mIU/mL)   <=1 WEEK        5 - 50     2 WEEKS       50 - 500     3 WEEKS       100 - 10,000     4 WEEKS     1,000 - 30,000     5 WEEKS     3,500 - 115,000   6-8 WEEKS     12,000 - 270,000    12 WEEKS     15,000 - 220,000        FEMALE AND NON-PREGNANT FEMALE:     LESS THAN 5 mIU/mL Performed at [facility], [address] [place], [place] [address]   Sedimentation rate     Status: Abnormal   Collection Time: 10/17/21  6:48 PM  Result Value Ref Range   Sed Rate >140 (H) 0 - 22 mm/hr    Comment: Performed at [facility], [address] [place], [place] [address]  Lactic acid, plasma     Status: None   Collection Time: 10/17/21 10:59 PM  Result Value Ref Range   Lactic Acid, Venous 1.0 0.5 - 1.9 mmol/L    Comment: Performed at [facility], [address], [place], [place] [address]  Resp Panel by RT-PCR (Flu A&B, Covid) Nasopharyngeal Swab     Status: None   Collection Time: 10/17/21 11:10 PM   Specimen: Nasopharyngeal Swab; Nasopharyngeal(NP) swabs in vial transport medium  Result Value Ref Range   SARS Coronavirus 2 by RT PCR NEGATIVE NEGATIVE    Comment: (NOTE) SARS-CoV-2 target nucleic acids are NOT DETECTED.  The SARS-CoV-2 RNA is generally detectable in upper respiratory specimens during the acute phase of infection. The lowest concentration of SARS-CoV-2 viral copies this assay can detect is 138 copies/mL. A negative result does not preclude SARS-Cov-2 infection and should not be used as the sole basis for treatment or other patient management decisions. A negative result may occur with  improper specimen collection/handling, submission of specimen other than nasopharyngeal swab, presence of viral mutation(s)  within the areas targeted by this assay, and inadequate number of viral copies(<138 copies/mL). A negative result must be combined with clinical observations, patient history, and epidemiological information. The expected result is Negative.  Fact Sheet for Patients:  EntrepreneurPulse.com.au  Fact Sheet for  Healthcare Providers:  IncredibleEmployment.be  This test is no t yet approved or cleared by the [place] and  has been authorized for detection and/or diagnosis of SARS-CoV-2 by FDA under an Emergency Use Authorization (EUA). This EUA will remain  in effect (meaning this test can be used) for the duration of the COVID-19 declaration under Section 564(b)(1) of the Act, 21 U.S.C.section 360bbb-3(b)(1), unless the authorization is terminated  or revoked sooner.       Influenza A by PCR NEGATIVE NEGATIVE   Influenza B by PCR NEGATIVE NEGATIVE    Comment: (NOTE) The Xpert Xpress SARS-CoV-2/FLU/RSV plus assay is intended as an aid in the diagnosis of influenza from Nasopharyngeal swab specimens and should not be used as a sole basis for treatment. Nasal washings and aspirates are unacceptable for Xpert Xpress SARS-CoV-2/FLU/RSV testing.  Fact Sheet for Patients: EntrepreneurPulse.com.au  Fact Sheet for Healthcare Providers: IncredibleEmployment.be  This test is not yet approved or cleared by the [place] FDA and has been authorized for detection and/or diagnosis of SARS-CoV-2 by FDA under an Emergency Use Authorization (EUA). This EUA will remain in effect (meaning this test can be used) for the duration of the COVID-19 declaration under Section 564(b)(1) of the Act, 21 U.S.C. section 360bbb-3(b)(1), unless the authorization is terminated or revoked.  Performed at [facility], [address], [place], [place] [address]   Lactic acid, plasma     Status: None   Collection Time: 10/18/21   2:00 AM  Result Value Ref Range   Lactic Acid, Venous 1.3 0.5 - 1.9 mmol/L    Comment: Performed at [facility], [address] [place], [place] [address]  Comprehensive metabolic panel     Status: Abnormal   Collection Time: 10/18/21  2:01 AM  Result Value Ref Range   Sodium 129 (L) 135 - 145 mmol/L   Potassium 3.1 (L) 3.5 - 5.1 mmol/L   Chloride 92 (L) 98 - 111 mmol/L   CO2 25 22 - 32 mmol/L   Glucose, Bld 410 (H) 70 - 99 mg/dL    Comment: Glucose reference range applies only to samples taken after fasting for at least 8 hours.   BUN 14 6 - 20 mg/dL   Creatinine, Ser 1.09 (H) 0.44 - 1.00 mg/dL   Calcium 7.9 (L) 8.9 - 10.3 mg/dL   Total Protein 7.1 6.5 - 8.1 g/dL   Albumin 2.7 (L) 3.5 - 5.0 g/dL   AST 13 (L) 15 - 41 U/L   ALT 11 0 - 44 U/L   Alkaline Phosphatase 65 38 - 126 U/L   Total Bilirubin 0.5 0.3 - 1.2 mg/dL   GFR, Estimated >60 >60 mL/min    Comment: (NOTE) Calculated using the CKD-EPI Creatinine Equation (2021)    Anion gap 12 5 - 15    Comment: Performed at [facility], [address] [place], [place] [address]  Magnesium     Status: Abnormal   Collection Time: 10/18/21  2:01 AM  Result Value Ref Range   Magnesium 1.4 (L) 1.7 - 2.4 mg/dL    Comment: Performed at [facility], [address] [place] [address], [place], [place] [address]  CBC WITH DIFFERENTIAL     Status: Abnormal   Collection Time: 10/18/21  2:01 AM  Result Value Ref Range   WBC 13.5 (H) 4.0 - 10.5 K/uL   RBC 3.03 (L) 3.87 - 5.11 MIL/uL   Hemoglobin 7.9 (L) 12.0 - 15.0 g/dL   HCT 24.2 (L) 36.0 - 46.0 %  MCV 79.9 (L) 80.0 - 100.0 fL   MCH 26.1 26.0 - 34.0 pg   MCHC 32.6 30.0 - 36.0 g/dL   RDW 13.6 11.5 - 15.5 %   Platelets 362 150 - 400 K/uL   nRBC 0.0 0.0 - 0.2 %   Neutrophils Relative % 86 %   Neutro Abs 11.6 (H) 1.7 - 7.7 K/uL   Lymphocytes Relative 8 %   Lymphs Abs 1.0 0.7 - 4.0 K/uL   Monocytes Relative 6 %   Monocytes Absolute 0.8 0.1 - 1.0 K/uL   Eosinophils Relative 0 %   Eosinophils Absolute 0.0  0.0 - 0.5 K/uL   Basophils Relative 0 %   Basophils Absolute 0.0 0.0 - 0.1 K/uL   Immature Granulocytes 0 %   Abs Immature Granulocytes 0.04 0.00 - 0.07 K/uL    Comment: Performed at [facility], [address], [place], [place] [address]  Glucose, capillary     Status: Abnormal   Collection Time: 10/18/21  3:25 AM  Result Value Ref Range   Glucose-Capillary 330 (H) 70 - 99 mg/dL    Comment: Glucose reference range applies only to samples taken after fasting for at least 8 hours.  Glucose, capillary     Status: Abnormal   Collection Time: 10/18/21  6:04 AM  Result Value Ref Range   Glucose-Capillary 225 (H) 70 - 99 mg/dL    Comment: Glucose reference range applies only to samples taken after fasting for at least 8 hours.    DG Ankle Complete Left  Result Date: 10/17/2021 CLINICAL DATA:  Initial evaluation for acute redness, pain. History of osteomyelitis. EXAM: LEFT ANKLE COMPLETE - 3+ VIEW COMPARISON:  None. FINDINGS: No acute fracture or dislocation. Ankle mortise approximated. No erosive changes to suggest acute osteomyelitis about the ankle. No visible soft tissue abnormality. Few scattered vascular calcifications noted. Age-indeterminate erosive changes about the fifth metatarsal head, partially visualized, and better evaluated on concomitant radiograph of foot. IMPRESSION: 1. No acute osseous abnormality about the left ankle. No radiographic evidence for acute osteomyelitis. 2. Age-indeterminate erosive changes about the fifth metatarsal head, better evaluated on concomitant radiograph of the foot. Electronically Signed   By: Jeannine Boga M.D.   On: 10/17/2021 23:50   DG Foot Complete Left  Result Date: 10/17/2021 CLINICAL DATA:  Initial evaluation for redness, swelling. History of osteomyelitis. EXAM: LEFT FOOT - COMPLETE 3+ VIEW COMPARISON:  Prior radiograph from 07/01/2018. FINDINGS: There has been progressive osseous erosion about the fifth metatarsal head since previous  exam. Progressive erosion and/or postsurgical changes seen about the left fifth proximal, middle, and distal phalanges as well. Finding of uncertain acuity, and could reflect progressive but chronic changes of osteomyelitis. There is additional osseous erosion centered about the left second PIP joint, concerning for osteomyelitis. Erosive changes with lucency extends to involve the adjacent second middle and proximal phalanges. Overlying soft tissue swelling. No visible soft tissue emphysema. Possible superimposed oblique fracture extending through the base of the left second proximal phalanx with intra-articular extension noted. No other acute osseous abnormality about the foot. 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 Small posterior calcaneal enthesophyte. No other soft tissue abnormality. Scattered vascular calcifications noted. IMPRESSION: 1. Osseous erosive changes centered about the left second PIP joint, concerning for acute osteomyelitis. 2. Possible superimposed oblique fracture extending through the base of the left second proximal phalanx with intra-articular extension. 3. Progressive osseous erosion about the left fifth metatarsal head as well as the fifth proximal, middle, and distal  phalanges. Finding is of uncertain acuity, and could reflect progressive but chronic changes of osteomyelitis. Electronically Signed   By: Jeannine Boga M.D.   On: 10/17/2021 23:48    ROS:  Pertinent items are noted in HPI.  Blood pressure 128/66, pulse (!) 101, temperature (!) 100.4 F (38 C), temperature source Oral, resp. rate 18, height 5\' 9"  (1.753 m), weight 76 kg, SpO2 98 %. Physical Exam: Pleasant white female no acute distress Head is normocephalic, atraumatic Lungs clear to auscultation with your breath sounds bilaterally Heart examination reveals a regular rate and rhythm without S3, S4, murmurs Extremity examination: Right foot with transmetatarsal amputation present.  Some  superficial skin loss is noted laterally.  No peripheral pulses palpable.  Left foot with necrosis of the second toe.  Significant erythema and swelling is noted circumferentially around the forefoot with erythematous splotchy skin and soft tissue extending above the ankle.  No dorsalis pedis or posterior tibial pulses noted.  No cellulitis in the pretibial region.  I was able to palpate a left femoral pulse.   Media Information Document Information  Photos    10/18/2021 08:19  Attached To:  Hospital Encounter on 10/17/21   Source Information  Aviva Signs, MD  [facility]    Media Information Document Information  Photos    10/18/2021 08:19  Attached To:  Hospital Encounter on 10/17/21   Source Information  Aviva Signs, MD  [facility]   Assessment/Plan: Impression: Cellulitis with osteomyelitis involving left second toe and ischemic changes of the left foot extending to the ankle.  Multiple comorbidities including peripheral vascular disease, congestive heart failure, uncontrolled diabetes mellitus, and chronic renal insufficiency.  Patient is currently on [name]. Plan: Arterial segmental Dopplers have been ordered for today.  I told the patient that there was a high risk of needing a left below the knee amputation.  We will get vascular surgery involved should the arterial study warrant.  Would continue IV antibiotics.  Would stop the [name] and convert to heparin drip as needed.  Will need cardiology consultation should she require surgical intervention.  Dr. Sloan Leiter at bedside.  Patient fully aware of evaluation and possible need for amputation.  Aviva Signs 10/18/2021, 8:32 AM

## 2021-10-18 NOTE — H&P (Addendum)
TRH H&P    Patient Demographics:    Anne [name], is a 40 y.o. female  MRN: [id number]  DOB - January 01, 1981  Admit Date - 10/17/2021  Referring MD/NP/PA: Vanessa [place]  Outpatient Primary MD for the patient is Zhou-Talbert, Elwyn Lade, MD  Patient coming from: Home  Chief complaint- foot wound   HPI:    Anne [name]  is a 40 y.o. female, with history of acid reflux, coronary artery disease, CKD, diabetes mellitus, gastroparesis, hypertension, myocardial infarction, renal failure, and more Presents ED with a chief complaint of foot wound.  Patient reports that the foot wound started approximately 1 month ago and the second digit, on her left lower extremity.  She is not sure how the initial wound started.  She reports that she has had erythema and edema in that foot since then.  [facility] and was started on the antibiotic that she does not recall.  She reports she has been on the antibiotic about 3 weeks.  She has become very nauseous over the past few days and has not been able to keep anything down including her antibiotic pill.  She reports she can even keep water down.  She does have a history of gastroparesis.  She started noticing red streaking up her leg 2 days ago.  The actual toe and forefoot is not painful because of neuropathy and numbness.  She started noticing pain in her ankle a couple of days ago.  Feels like hot vegetable oil pouring down the back of her ankle and into her shoe.  It is worse with weightbearing.  It is better with rest.  It is currently a 6 out of 10 on the pain scale.  She does report she was given some pain medication in the ED.  Looks like she had 1 mg of Dilaudid.  Patient reports that she has been monitoring her glucose at home.  Over the last few days its been 300-400.  She has not been taking her 51 units of basal insulin because she has not been able to keep  food down and she was worried she would drop to hypoglycemia.  She has been taking a sliding scale NovoLog.  Patient believes her self to be a type II diabetic, but her chart lists her as a type I diabetic.  Either way, she is insulin-dependent.  Patient has no other complaints at this time.  Patient does not smoke, does not drink alcohol, does not use illicit drugs.  Patient is not vaccinated for COVID.  Patient is full code.  In the ED Temp 98.7, heart rate 92-104, respiratory rate 17-18, blood pressure 134/81, satting 100% Leukocytosis 17.6, hemoglobin 10.0, platelets 446 Chemistry panel reveals hyponatremia and hypokalemia Her creatinine is at baseline 1.25 She is hyperglycemic at 380, gap 17, bicarb 25 Albumin is slightly decreased at 3.3 Patient received 1 L bolus She had an x-ray of her ankle that showed no acute osseous abnormality She had an x-ray of her left foot that showed second PIP osteomyelitis and  fifth metatarsal osteomyelitis Admission patient was started on vancomycin, cefepime, Flagyl Admission requested from management of osteomyelitis   Review of systems:    In addition to the HPI above,  No Fever-chills, No Headache, No changes with Vision or hearing, No problems swallowing food or Liquids, No Chest pain, Cough or Shortness of Breath, No Abdominal pain, bowel movements are regular, No Blood in stool or Urine, No dysuria, No new skin rashes or bruises, No new weakness, tingling, numbness in any extremity, No recent weight gain or loss, No polyuria, polydypsia or polyphagia, No significant Mental Stressors.  All other systems reviewed and are negative.    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 [facility])    CAD (coronary artery disease) 11/13/2018   Late presentation anterior MI 12/19 >> LHC - dLM 25, mLAD 99, oOM2 100 (R-L  collats), irreg RCA, EF 25-35 >> PCI: POBA to mLAD   Chronic systolic CHF (congestive heart failure) ([place]) 11/28/2018   Ischemic CM // late presentation ant MI 10/2018 tx with POBA to LAD (residual CAD with CTO of the OM2) // Echo 12/19:  No mural apical thrombus, septal, apical mid ant and inf HK; mild LVH, EF 30-35, mild MR // Echo 01/2019: EF 30-35, Gr 1 DD, diff HK, apical AK, mild MR    CKD (chronic kidney disease), stage I    Diabetes mellitus type 1 ([place])    Dyspnea    Former tobacco use    Gastroparesis    Hypercholesteremia    Hypertension    Myocardial infarction ([place]) 2019   Osteomyelitis ([place])    a. s/p R forefoot amputation.   Renal failure    Sciatica       Past Surgical History:  Procedure Laterality Date   AMPUTATION Right 11/03/2011   Procedure: AMPUTATION RAY;  Surgeon: Jamesetta So;  Location: [facility];  Service: General;  Laterality: Right;  Right fourth and fifth metatarsal    APPENDECTOMY     CARDIAC CATHETERIZATION  10/2018   CESAREAN SECTION  2004 and 2007   x2   CORONARY/GRAFT ACUTE MI REVASCULARIZATION N/A 11/11/2018   Procedure: CORONARY/GRAFT ACUTE MI REVASCULARIZATION;  Surgeon: Jettie Booze, MD;  Location: [place] [facility];  Service: Cardiovascular;  Laterality: N/A;   FRACTURE SURGERY  2000   INJECTION OF SILICONE OIL Right 99/83/3825   Procedure: Injection Of Silicone Oil;  Surgeon: Bernarda Caffey, MD;  Location: [place];  Service: Ophthalmology;  Laterality: Right;   LEFT HEART CATH AND CORONARY ANGIOGRAPHY N/A 11/11/2018   Procedure: LEFT HEART CATH AND CORONARY ANGIOGRAPHY;  Surgeon: Jettie Booze, MD;  Location: [place] [facility];  Service: Cardiovascular;  Laterality: N/A;   MEMBRANE PEEL Right 09/14/2019   Procedure: Antoine Primas;  Surgeon: Bernarda Caffey, MD;  Location: [place];  Service: Ophthalmology;  Laterality: Right;   PARS PLANA VITRECTOMY Right 09/14/2019   Procedure: Pars Plana Vitrectomy With 25 Gauge;  Surgeon: Bernarda Caffey, MD;  Location: [place];  Service: Ophthalmology;  Laterality: Right;   PHOTOCOAGULATION WITH LASER Right 09/14/2019   Procedure: Photocoagulation With Laser;  Surgeon: Bernarda Caffey, MD;  Location: [place];  Service: Ophthalmology;  Laterality: Right;   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   Procedure: REPAIR OF COMPLEX TRACTION RETINAL  DETACHMENT;  Surgeon: Bernarda Caffey, MD;  Location: [place];  Service: Ophthalmology;  Laterality: Right;   TUBAL LIGATION        Social History:      Social History   Tobacco Use   Smoking status: Former    Packs/day: 0.25    Years: 20.00    Pack years: 5.00    Types: Cigarettes    Quit date: 12/29/2013    Years since quitting: 7.8   Smokeless tobacco: Never  Substance Use Topics   Alcohol use: No       Family History :     Family History  Problem Relation Age of Onset   Diabetes Father    Lung cancer Father    Alcoholism Father    Asthma Mother    Kidney disease Mother    Anemia Mother        hemolytic   Hypertension Mother    Heart attack Paternal Grandmother    Diabetes Paternal Grandmother    Diabetes Paternal Grandfather    Anesthesia problems Neg Hx    Hypotension Neg Hx    Malignant hyperthermia Neg Hx    Pseudochol deficiency Neg Hx       Home Medications:   Prior to Admission medications   Medication Sig Start Date End Date Taking? Authorizing Provider  acetaminophen (TYLENOL) 500 MG tablet Take 500 mg by mouth every 4 (four) hours as needed.    [provider]  albuterol (VENTOLIN HFA) 108 (90 Base) MCG/ACT inhaler Inhale 2 puffs into the lungs every 6 (six) hours as needed for wheezing or shortness of breath.    [provider]  aspirin (GNP ASPIRIN LOW DOSE) 81 MG EC tablet Take 1 tablet (81 mg total) by mouth daily with breakfast. Swallow whole. 05/04/19   Roxan Hockey, MD  atorvastatin (LIPITOR) 80 MG tablet Take 1 tablet (80 mg total) by mouth every evening.  05/04/19   Emokpae, Courage, MD  BAQSIMI TWO PACK 3 MG/DOSE POWD Place 3 mg into the nose once as needed (for emergency low blood sugar levels).  01/24/20   [provider]  brimonidine (ALPHAGAN) 0.2 % ophthalmic solution Place 1 drop into the right eye 2 (two) times daily. 07/12/20   Bernarda Caffey, MD  BYDUREON BCISE 2 MG/0.85ML AUIJ Inject 2 mg into the skin every Thursday. 12/26/19   [provider]  carvedilol (COREG) 3.125 MG tablet Take 1 tablet (3.125 mg total) by mouth 2 (two) times daily. 01/31/21 05/01/21  Fay Records, MD  clindamycin (CLEOCIN) 150 MG capsule Take 2 capsules (300 mg total) by mouth 3 (three) times daily. May dispense as 132m capsules 02/12/21   Harris, Abigail, PA-C  dorzolamide-timolol (COSOPT) 22.3-6.8 MG/ML ophthalmic solution Place 1 drop into the right eye 2 (two) times daily. 07/12/20   ZBernarda Caffey MD  ezetimibe (ZETIA) 10 MG tablet TAKE 1 TABLET BY MOUTH ONCE DAILY. 03/28/21   RFay Records MD  fenofibrate (TRICOR) 145 MG tablet Take 1 tablet (145 mg total) by mouth daily. 12/26/19   HDaune Perch NP  furosemide (LASIX) 40 MG tablet Take 1 tablet (40 mg total) by mouth 2 (two) times daily. 05/04/19 03/08/20  ERoxan Hockey MD  isosorbide mononitrate (IMDUR) 30 MG 24 hr tablet TAKE 1 TABLET BY MOUTH ONCE DAILY. 06/20/21   RFay Records MD  LANTUS SOLOSTAR 100 UNIT/ML Solostar Pen INNJECT 50 UNITS S.Q. ONCE DAILY AT 10 P.M. Patient taking differently: Inject 50 Units into the skin  at bedtime. 02/03/16   Cassandria Anger, MD  losartan (COZAAR) 25 MG tablet Take 1 tablet (25 mg total) by mouth every evening. 05/04/19   Denton Brick, Courage, MD  metFORMIN (GLUCOPHAGE) 1000 MG tablet Take 1,000 mg by mouth 2 (two) times daily with a meal.    [provider]  metoCLOPramide (REGLAN) 5 MG tablet Take 1 tablet (5 mg total) by mouth 3 (three) times daily before meals. 03/12/20   Barton Dubois, MD  nitroGLYCERIN (NITROSTAT) 0.4 MG SL tablet Place 1 tablet  (0.4 mg total) under the tongue every 5 (five) minutes as needed. 05/04/19   Emokpae, Courage, MD  NOVOLOG FLEXPEN 100 UNIT/ML FlexPen INJECT 12-18 UNITS S.Q. THREE TIMES DAILY WITH MEALS. Patient taking differently: Inject 12-18 Units into the skin 3 (three) times daily with meals. 04/29/16   Cassandria Anger, MD  ondansetron (ZOFRAN ODT) 4 MG disintegrating tablet Take 1 tablet (4 mg total) by mouth every 8 (eight) hours as needed. 71m ODT q4 hours prn nausea/vomit 07/10/21   Mesner, JCorene Cornea MD  ondansetron (ZOFRAN) 4 MG tablet Take 1 tablet (4 mg total) by mouth every 8 (eight) hours as needed for nausea or vomiting. 07/10/21   Mesner, JCorene Cornea MD  oxyCODONE (ROXICODONE) 5 MG immediate release tablet Take 0.5-1 tablets (2.5-5 mg total) by mouth every 6 (six) hours as needed for severe pain. 02/12/21   HMargarita Mail PA-C  pantoprazole (PROTONIX) 40 MG tablet Take 1 tablet (40 mg total) by mouth daily. 03/12/20 03/12/21  MBarton Dubois MD  pregabalin (LYRICA) 75 MG capsule Take 75 mg by mouth daily. 06/27/20   [provider]  promethazine (PHENERGAN) 12.5 MG suppository Place 1 suppository (12.5 mg total) rectally as needed for refractory nausea / vomiting. 03/12/20   MBarton Dubois MD  sertraline (ZOLOFT) 100 MG tablet Take 200 mg by mouth at bedtime.     [provider]  ticagrelor ([name]) 90 MG TABS tablet Take 1 tablet (90 mg total) by mouth 2 (two) times daily. 05/04/19   ERoxan Hockey MD  Vitamin D, Ergocalciferol, (DRISDOL) 1.25 MG (50000 UNIT) CAPS capsule Take 50,000 Units by mouth every 7 (seven) days. 06/27/20   [provider]     Allergies:     Allergies  Allergen Reactions   Canagliflozin Other (See Comments)    Vaginal yeast infections   Nsaids Other (See Comments)    Yeast infection      Physical Exam:   Vitals  Blood pressure 134/81, pulse (!) 101, temperature 98.7 F (37.1 C), temperature source Oral, resp. rate 17, height 5' 9"  (1.753 m),  weight 76.2 kg, SpO2 100 %.   1.  General: Sitting up IPanamastyle on the bed, no acute distress   2. Psychiatric: Alert and oriented x 3, mood and behavior normal for situation, pleasant and cooperative with exam   3. Neurologic: Speech and language are normal, face is symmetric, moves all 4 extremities voluntarily, at baseline without acute deficits on limited exam   4. HEENMT:  Head is atraumatic, normocephalic, pupils reactive to light, neck is supple, trachea is midline, mucous membranes are moist   5. Respiratory : Lungs are clear to auscultation bilaterally without wheezing, rhonchi, rales, no cyanosis, no increase in work of breathing or accessory muscle use   6. Cardiovascular : Heart rate normal, rhythm is regular, no murmurs, rubs or gallops, no peripheral pitting edema, peripheral pulses are difficult to palpate in the lower extremities bilaterally   7. Gastrointestinal:  Abdomen is soft, nondistended, nontender to palpation bowel sounds active, no masses or organomegaly palpated   8. Skin:  Skin on left foot is erythematous with the erythema concentrated at the most distal aspect of the digits, there is necrosis of the second digit, erythematous streaking to ankle   9.Musculoskeletal:  No asymmetry in tone, no peripheral edema, peripheral pulses palpated, but difficult, no tenderness to palpation in the extremities     Data Review:    CBC Recent Labs  Lab 10/17/21 1848  WBC 17.6*  HGB 10.0*  HCT 30.8*  PLT 446*  MCV 80.4  MCH 26.1  MCHC 32.5  RDW 13.7   ------------------------------------------------------------------------------------------------------------------  Results for orders placed or performed during the hospital encounter of 10/17/21 (from the past 48 hour(s))  Lipase, blood     Status: None   Collection Time: 10/17/21  6:48 PM  Result Value Ref Range   Lipase 19 11 - 51 U/L    Comment: Performed at [facility], [address],  [place], [place] [address]  Comprehensive metabolic panel     Status: Abnormal   Collection Time: 10/17/21  6:48 PM  Result Value Ref Range   Sodium 128 (L) 135 - 145 mmol/L   Potassium 2.8 (L) 3.5 - 5.1 mmol/L   Chloride 86 (L) 98 - 111 mmol/L   CO2 25 22 - 32 mmol/L   Glucose, Bld 380 (H) 70 - 99 mg/dL    Comment: Glucose reference range applies only to samples taken after fasting for at least 8 hours.   BUN 17 6 - 20 mg/dL   Creatinine, Ser 1.25 (H) 0.44 - 1.00 mg/dL   Calcium 8.9 8.9 - 10.3 mg/dL   Total Protein 9.0 (H) 6.5 - 8.1 g/dL   Albumin 3.3 (L) 3.5 - 5.0 g/dL   AST 13 (L) 15 - 41 U/L   ALT 12 0 - 44 U/L   Alkaline Phosphatase 77 38 - 126 U/L   Total Bilirubin 1.0 0.3 - 1.2 mg/dL   GFR, Estimated 56 (L) >60 mL/min    Comment: (NOTE) Calculated using the CKD-EPI Creatinine Equation (2021)    Anion gap 17 (H) 5 - 15    Comment: Performed at [facility], [address] [place], [place] [address]  CBC     Status: Abnormal   Collection Time: 10/17/21  6:48 PM  Result Value Ref Range   WBC 17.6 (H) 4.0 - 10.5 K/uL   RBC 3.83 (L) 3.87 - 5.11 MIL/uL   Hemoglobin 10.0 (L) 12.0 - 15.0 g/dL   HCT 30.8 (L) 36.0 - 46.0 %   MCV 80.4 80.0 - 100.0 fL   MCH 26.1 26.0 - 34.0 pg   MCHC 32.5 30.0 - 36.0 g/dL   RDW 13.7 11.5 - 15.5 %   Platelets 446 (H) 150 - 400 K/uL   nRBC 0.0 0.0 - 0.2 %    Comment: Performed at [facility], [address], [place], [place] [address]  hCG, quantitative, pregnancy     Status: None   Collection Time: 10/17/21  6:48 PM  Result Value Ref Range   hCG, Beta Chain, Quant, S <1 <5 mIU/mL    Comment:          GEST. AGE      CONC.  (mIU/mL)   <=1 WEEK        5 - 50     2 WEEKS       50 - 500  3 WEEKS       100 - 10,000     4 WEEKS     1,000 - 30,000     5 WEEKS     3,500 - 115,000   6-8 WEEKS     12,000 - 270,000    12 WEEKS     15,000 - 220,000        FEMALE AND NON-PREGNANT FEMALE:     LESS THAN 5 mIU/mL Performed at [facility], [address] [place], [place] [address]   Lactic acid, plasma     Status: None   Collection Time: 10/17/21 10:59 PM  Result Value Ref Range   Lactic Acid, Venous 1.0 0.5 - 1.9 mmol/L    Comment: Performed at [facility], [address], [place], [place] [address]    Chemistries  Recent Labs  Lab 10/17/21 1848  NA 128*  K 2.8*  CL 86*  CO2 25  GLUCOSE 380*  BUN 17  CREATININE 1.25*  CALCIUM 8.9  AST 13*  ALT 12  ALKPHOS 77  BILITOT 1.0   ------------------------------------------------------------------------------------------------------------------  ------------------------------------------------------------------------------------------------------------------ GFR: Estimated Creatinine Clearance: 61.9 mL/min (A) (by C-G formula based on SCr of 1.25 mg/dL (H)). Liver Function Tests: Recent Labs  Lab 10/17/21 1848  AST 13*  ALT 12  ALKPHOS 77  BILITOT 1.0  PROT 9.0*  ALBUMIN 3.3*   Recent Labs  Lab 10/17/21 1848  LIPASE 19   No results for input(s): AMMONIA in the last 168 hours. Coagulation Profile: No results for input(s): INR, PROTIME in the last 168 hours. Cardiac Enzymes: No results for input(s): CKTOTAL, CKMB, CKMBINDEX, TROPONINI in the last 168 hours. BNP (last 3 results) No results for input(s): PROBNP in the last 8760 hours. HbA1C: No results for input(s): HGBA1C in the last 72 hours. CBG: No results for input(s): GLUCAP in the last 168 hours. Lipid Profile: No results for input(s): CHOL, HDL, LDLCALC, TRIG, CHOLHDL, LDLDIRECT in the last 72 hours. Thyroid Function Tests: No results for input(s): TSH, T4TOTAL, FREET4, T3FREE, THYROIDAB in the last 72 hours. Anemia Panel: No results for input(s): VITAMINB12, FOLATE, FERRITIN, TIBC, IRON, RETICCTPCT in the last 72 hours.  --------------------------------------------------------------------------------------------------------------- Urine analysis:    Component Value Date/Time   COLORURINE STRAW (A)  02/12/2021 1705   APPEARANCEUR HAZY (A) 02/12/2021 1705   LABSPEC 1.013 02/12/2021 1705   PHURINE 7.0 02/12/2021 1705   GLUCOSEU >=500 (A) 02/12/2021 1705   HGBUR NEGATIVE 02/12/2021 1705   BILIRUBINUR NEGATIVE 02/12/2021 1705   BILIRUBINUR neg 06/03/2012 1536   KETONESUR NEGATIVE 02/12/2021 1705   PROTEINUR NEGATIVE 02/12/2021 1705   UROBILINOGEN 0.2 06/26/2013 1354   NITRITE NEGATIVE 02/12/2021 1705   LEUKOCYTESUR NEGATIVE 02/12/2021 1705      Imaging Results:    DG Ankle Complete Left  Result Date: 10/17/2021 CLINICAL DATA:  Initial evaluation for acute redness, pain. History of osteomyelitis. EXAM: LEFT ANKLE COMPLETE - 3+ VIEW COMPARISON:  None. FINDINGS: No acute fracture or dislocation. Ankle mortise approximated. No erosive changes to suggest acute osteomyelitis about the ankle. No visible soft tissue abnormality. Few scattered vascular calcifications noted. Age-indeterminate erosive changes about the fifth metatarsal head, partially visualized, and better evaluated on concomitant radiograph of foot. IMPRESSION: 1. No acute osseous abnormality about the left ankle. No radiographic evidence for acute osteomyelitis. 2. Age-indeterminate erosive changes about the fifth metatarsal head, better evaluated on concomitant radiograph of the foot. Electronically Signed   By: Jeannine Boga M.D.   On: 10/17/2021 23:50  DG Foot Complete Left  Result Date: 10/17/2021 CLINICAL DATA:  Initial evaluation for redness, swelling. History of osteomyelitis. EXAM: LEFT FOOT - COMPLETE 3+ VIEW COMPARISON:  Prior radiograph from 07/01/2018. FINDINGS: There has been progressive osseous erosion about the fifth metatarsal head since previous exam. Progressive erosion and/or postsurgical changes seen about the left fifth proximal, middle, and distal phalanges as well. Finding of uncertain acuity, and could reflect progressive but chronic changes of osteomyelitis. There is additional osseous erosion  centered about the left second PIP joint, concerning for osteomyelitis. Erosive changes with lucency extends to involve the adjacent second middle and proximal phalanges. Overlying soft tissue swelling. No visible soft tissue emphysema. Possible superimposed oblique fracture extending through the base of the left second proximal phalanx with intra-articular extension noted. No other acute osseous abnormality about the foot. 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 Small posterior calcaneal enthesophyte. No other soft tissue abnormality. Scattered vascular calcifications noted. IMPRESSION: 1. Osseous erosive changes centered about the left second PIP joint, concerning for acute osteomyelitis. 2. Possible superimposed oblique fracture extending through the base of the left second proximal phalanx with intra-articular extension. 3. Progressive osseous erosion about the left fifth metatarsal head as well as the fifth proximal, middle, and distal phalanges. Finding is of uncertain acuity, and could reflect progressive but chronic changes of osteomyelitis. Electronically Signed   By: Jeannine Boga M.D.   On: 10/17/2021 23:48       Assessment & Plan:    Principal Problem:   Osteomyelitis ([place]) Active Problems:   Type 2 diabetes mellitus with vascular disease ([place])   Hyperlipidemia associated with type 2 diabetes mellitus ([facility])   Essential hypertension   Intractable nausea and vomiting   Osteomyelitis in diabetic As seen on x-ray CRP and ESR pending ABI pending Consult general surgery Vancomycin, cefepime, Flagyl per lower extremity wound order set N.p.o. after midnight Continue to monitor Diabetes mellitus type 2 Continue reduced dose of basal insulin Continue sliding scale N.p.o. after midnight Holding metformin Continue to monitor Hyperlipidemia Continue statin medication Continue Zetia Continue fenofibrate Essential hypertension Continue Coreg Continue  losartan History of CHF Continue aspirin, continue Imdur, continue statin, continue beta-blocker, continue ARB Not currently in exacerbation Diabetic neuropathy Lyrica Intractable Nausea and vomiting Continue reglan Likely 2/2 gastroparesis Add second agent for refractory nausea if needed   DVT Prophylaxis-   Heparin - SCDs   AM Labs Ordered, also please review Full Orders  Family Communication: No family at bedside Code Status: Full  Admission status:Inpatient :The appropriate admission status for this patient is INPATIENT. Inpatient status is judged to be reasonable and necessary in order to provide the required intensity of service to ensure the patient's safety. The patient's presenting symptoms, physical exam findings, and initial radiographic and laboratory data in the context of their chronic comorbidities is felt to place them at high risk for further clinical deterioration. Furthermore, it is not anticipated that the patient will be medically stable for discharge from the hospital within 2 midnights of admission. The following factors support the admission status of inpatient.     The patient's presenting symptoms include erythema edema and pain of left foot. The worrisome physical exam findings include erythema edema and necrosis. The initial radiographic and laboratory data are worrisome because of osteomyelitis. The chronic co-morbidities include diabetes mellitus type 2, CHF, hypertension, hyperlipidemia.       * I certify that at the point of admission it is my clinical judgment that the patient will require  inpatient hospital care spanning beyond 2 midnights from the point of admission due to high intensity of service, high risk for further deterioration and high frequency of surveillance required.*  Disposition: Anticipated Discharge 3-4 days discharge to home  Time spent in minutes : [place] DO

## 2021-10-18 NOTE — Progress Notes (Signed)
Pharmacy Antibiotic Note  Anne [name] is a 40 y.o. female admitted on 10/17/2021 with  suspected diabetic foot infection .  Pharmacy has been consulted for Vancomycin and Cefepime dosing x 7 days. Pt also on po Flagyl.  Plan: Cefepime 2gm IV q8h x 7 days Vancomycin 1500 mg IV Q 24 hrs x 7 days. Goal AUC 400-550. Expected AUC: 490, SCr used: 1.25 Will f/u renal function, micro data, and pt's clinical condition Vanc levels prn   Height: 5\' 9"  (175.3 cm) Weight: 76.2 kg (168 lb) IBW/kg (Calculated) : 66.2  Temp (24hrs), Avg:98.7 F (37.1 C), Min:98.7 F (37.1 C), Max:98.7 F (37.1 C)  Recent Labs  Lab 10/17/21 1848 10/17/21 2259  WBC 17.6*  --   CREATININE 1.25*  --   LATICACIDVEN  --  1.0    Estimated Creatinine Clearance: 61.9 mL/min (A) (by C-G formula based on SCr of 1.25 mg/dL (H)).    Allergies  Allergen Reactions   Canagliflozin Other (See Comments)    Vaginal yeast infections   Nsaids Other (See Comments)    Yeast infection     Antimicrobials this admission: 11/26 Vanc >> 12/2 11/26 Cefepime >> 12/2 11/26 Flagyl >>12/2  Microbiology results: Pending  Thank you for allowing pharmacy to be a part of this patient's care.  Sherlon Handing, PharmD, BCPS Please see amion for complete clinical pharmacist phone list 10/18/2021 12:03 AM

## 2021-10-18 NOTE — Plan of Care (Signed)

## 2021-10-19 DIAGNOSIS — M869 Osteomyelitis, unspecified: Secondary | ICD-10-CM | POA: Diagnosis not present

## 2021-10-19 DIAGNOSIS — I10 Essential (primary) hypertension: Secondary | ICD-10-CM | POA: Diagnosis not present

## 2021-10-19 LAB — BASIC METABOLIC PANEL
Anion gap: 9 (ref 5–15)
BUN: 14 mg/dL (ref 6–20)
CO2: 23 mmol/L (ref 22–32)
Calcium: 8.1 mg/dL — ABNORMAL LOW (ref 8.9–10.3)
Chloride: 101 mmol/L (ref 98–111)
Creatinine, Ser: 1.01 mg/dL — ABNORMAL HIGH (ref 0.44–1.00)
GFR, Estimated: >60 mL/min (ref >60)
Glucose, Bld: 134 mg/dL — ABNORMAL HIGH (ref 70–99)
Potassium: 3.6 mmol/L (ref 3.5–5.1)
Sodium: 133 mmol/L — ABNORMAL LOW (ref 135–145)

## 2021-10-19 LAB — CBC WITH DIFFERENTIAL/PLATELET
Abs Immature Granulocytes: 0.09 10*3/uL — ABNORMAL HIGH (ref 0.00–0.07)
Basophils Absolute: 0 10*3/uL (ref 0.0–0.1)
Basophils Relative: 0 %
Eosinophils Absolute: 0.1 10*3/uL (ref 0.0–0.5)
Eosinophils Relative: 1 %
HCT: 23 % — ABNORMAL LOW (ref 36.0–46.0)
Hemoglobin: 7.2 g/dL — ABNORMAL LOW (ref 12.0–15.0)
Immature Granulocytes: 1 %
Lymphocytes Relative: 6 %
Lymphs Abs: 0.8 10*3/uL (ref 0.7–4.0)
MCH: 25.9 pg — ABNORMAL LOW (ref 26.0–34.0)
MCHC: 31.3 g/dL (ref 30.0–36.0)
MCV: 82.7 fL (ref 80.0–100.0)
Monocytes Absolute: 0.8 10*3/uL (ref 0.1–1.0)
Monocytes Relative: 6 %
Neutro Abs: 11.7 10*3/uL — ABNORMAL HIGH (ref 1.7–7.7)
Neutrophils Relative %: 86 %
Platelets: 359 10*3/uL (ref 150–400)
RBC: 2.78 MIL/uL — ABNORMAL LOW (ref 3.87–5.11)
RDW: 13.8 % (ref 11.5–15.5)
WBC: 13.6 10*3/uL — ABNORMAL HIGH (ref 4.0–10.5)
nRBC: 0 % (ref 0.0–0.2)

## 2021-10-19 LAB — MAGNESIUM: Magnesium: 1.9 mg/dL (ref 1.7–2.4)

## 2021-10-19 LAB — PREALBUMIN: Prealbumin: 5.7 mg/dL — ABNORMAL LOW (ref 18–38)

## 2021-10-19 LAB — GLUCOSE, CAPILLARY
Glucose-Capillary: 109 mg/dL — ABNORMAL HIGH (ref 70–99)
Glucose-Capillary: 173 mg/dL — ABNORMAL HIGH (ref 70–99)
Glucose-Capillary: 176 mg/dL — ABNORMAL HIGH (ref 70–99)
Glucose-Capillary: 188 mg/dL — ABNORMAL HIGH (ref 70–99)
Glucose-Capillary: 249 mg/dL — ABNORMAL HIGH (ref 70–99)

## 2021-10-19 LAB — PHOSPHORUS: Phosphorus: 1.5 mg/dL — ABNORMAL LOW (ref 2.5–4.6)

## 2021-10-19 MED ORDER — JUVEN PO PACK
1.0000 | PACK | Freq: Two times a day (BID) | ORAL | Status: DC
  Administered 2021-10-20 – 2021-10-28 (×14): 1 via ORAL
  Filled 2021-10-19 (×15): qty 1

## 2021-10-19 MED ORDER — PROMETHAZINE HCL 25 MG/ML IJ SOLN
INTRAMUSCULAR | Status: AC
  Filled 2021-10-19: qty 1

## 2021-10-19 MED ORDER — PROSOURCE PLUS PO LIQD
30.0000 mL | Freq: Two times a day (BID) | ORAL | Status: DC
  Administered 2021-10-19 – 2021-10-27 (×13): 30 mL via ORAL
  Filled 2021-10-19 (×13): qty 30

## 2021-10-19 MED ORDER — ADULT MULTIVITAMIN W/MINERALS CH
1.0000 | ORAL_TABLET | Freq: Every day | ORAL | Status: DC
  Administered 2021-10-19 – 2021-10-28 (×11): 1 via ORAL
  Filled 2021-10-19 (×10): qty 1

## 2021-10-19 MED ORDER — GLUCERNA SHAKE PO LIQD: 237.0000 mL | Freq: Two times a day (BID) | ORAL | Status: DC

## 2021-10-19 MED ORDER — POTASSIUM PHOSPHATES 15 MMOLE/5ML IV SOLN
20.0000 mmol | Freq: Once | INTRAVENOUS | Status: AC
  Administered 2021-10-19: 13:00:00 20 mmol via INTRAVENOUS
  Filled 2021-10-19: qty 6.67

## 2021-10-19 NOTE — Progress Notes (Signed)
Initial Nutrition Assessment RD working remotely.  DOCUMENTATION CODES:   Not applicable  INTERVENTION:  - will order Glucerna Shake BID, each supplement provides 220 kcal and 10 grams of protein. - will order 1 packet Juven BID, each packet provides 95 calories, 2.5 grams of protein (collagen), and 9.8 grams of carbohydrate (3 grams sugar); also contains 7 grams of L-arginine and L-glutamine, 300 mg vitamin C, 15 mg vitamin E, 1.2 mcg vitamin B-12, 9.5 mg zinc, 200 mg calcium, and 1.5 g  Calcium Beta-hydroxy-Beta-methylbutyrate to support wound healing. - will order 30 ml Prosource Plus BID, each supplement provides 100 kcal and 15 grams protein.  - will order 1 tablet multivitamin with minerals/day.    NUTRITION DIAGNOSIS:   Increased nutrient needs related to acute illness, wound healing as evidenced by estimated needs.  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 She presented to the ED due to 1 month of pain, swelling, and discharge/drainage from L foot not relieved by outpatient therapies. She also reported persistent N/V and consistently elevated CBGs.  Unable to reach patient on the phone. Noted that location is currently marked as "AP-Cardiopulmonary Svc" so unsure if patient may be out of the room.   Will need to ask about glycemic control at home during follow-up assessment.   She has not been assessed by a [place] RD since 11/12/2018.   Weight yesterday was 167 lb and weight on 02/12/21 was 205 lb. This indicates 38 lb weight loss (18.5% body weight) in the past 8 months; significant for time frame.    Per notes: - diabetic foot ulcer/spreading cellulitis and necrosis with osteomyelitis of the L foot - gastroparesis - last known HgbA1c: 11%  Labs reviewed; CBGs: 173, 109, 249, and 176 mg/dl, Na: 133 mmol/l, creatinine: 1.01 mg/dl, Ca: 8.1 mg/dl, Phos: 1.5 mg/dl.   Medications reviewed; sliding scale novolog, 15 units novolog TID, 40 units levemir/day, 40 mEq Klor-Con BID, 20 mmol IV KPhos x1 run 11/27.    NUTRITION - FOCUSED PHYSICAL EXAM:  RD working remotely.  Diet Order:   Diet Order             Diet Carb Modified Fluid consistency: Thin; Room service appropriate? Yes  Diet effective now                   EDUCATION NEEDS:   Not appropriate for education at this time  Skin:  Skin Assessment: Skin Integrity Issues: Skin Integrity Issues:: Other (Comment) Other: L heel redness and swelling  Last BM:  PTA/unknown  Height:   Ht Readings from Last 1 Encounters:  10/18/21 5\' 9"  (1.753 m)    Weight:   Wt Readings from Last 1 Encounters:  10/18/21 76 kg     Estimated Nutritional Needs:  Kcal:  2100-2300 kcal Protein:  110-120 grams Fluid:  >/= 2.2 L/day      Jarome Matin, MS, RD, LDN, CNSC Inpatient Clinical Dietitian RD pager # available in AMION  After hours/weekend pager # available in [facility]

## 2021-10-19 NOTE — Progress Notes (Signed)
PROGRESS NOTE    Anne [name]  ZYS:[id number] DOB: 04-29-1981 DOA: 10/17/2021 PCP: Alfonse Flavors, MD    Brief Narrative:  40 year old with history of type 2 diabetes on insulin, peripheral vascular disease, hyperlipidemia, essential hypertension, previous transmetatarsal amputation right foot, chronic combined heart failure presented with 1 month of pain and swelling drainage and discharge from the left foot not relieved by outpatient therapies.  Consistently elevated blood sugars.  Also with persistent nausea vomiting.   Assessment & Plan:   Principal Problem:   Osteomyelitis ([place]) Active Problems:   Type 2 diabetes mellitus with vascular disease ([place])   Hyperlipidemia associated with type 2 diabetes mellitus ([place])   Essential hypertension   Intractable nausea and vomiting   Osteomyelitis of second toe of left foot ([facility])  Diabetic foot ulcer/spreading cellulitis and necrosis with osteomyelitis of the foot: Nonviable foot.  Currently stabilizing on broad-spectrum antibiotics with vancomycin cefepime and Flagyl.  Blood cultures negative so far.  X-ray consistent with digital osteomyelitis.  ABI with reasonable good blood flow. Surgery following, anticipate distal tibial/below-knee amputation.  Adequate pain medications.  Type 2 diabetes on insulin, diabetic gastroparesis and diabetic foot ulcer: Peripheral neuropathy Poorly controlled.  Last known A1c 11.  On escalating dose of insulin in the hospital.  Blood sugars acceptable today.  We will continue to work on good blood sugar control perioperative. Patient is on Lyrica that is continued. Symptomatic treatment for gastroparesis.  She is somehow better today.  History of coronary artery disease/ischemic cardiomyopathy: Patient on dual antiplatelet therapy with aspirin and [name], last dose of [name] on 11/25.  Holding [name] but continuing aspirin.  Currently without chest pain. She is also on nitrates, statin,  beta-blockers and ARB that is continued. 2D echocardiogram shows mostly chronic changes. Will consult cardiology 11/28 for preoperative evaluation and optimization if any needed.  Hypokalemia: Replace aggressively.  Recheck levels.  Hypophosphatemia: We will replace aggressively today.  Recheck levels to ensure stabilization.   DVT prophylaxis: heparin injection 5,000 Units Start: 10/18/21 0600 SCDs Start: 10/18/21 0110   Code Status: Full code Family Communication: None Disposition Plan: Status is: Inpatient  Remains inpatient appropriate because: Significant foot infection and osteomyelitis needing IV antibiotics and inpatient surgical procedures.         Consultants:  General surgery  Procedures:  None  Antimicrobials:  Vancomycin, cefepime and Flagyl 11/25---   Subjective: Patient seen and examined.  Pain is controlled today.  Nausea is better today.  Looking forward for surgery.  Objective: Vitals:   10/18/21 1300 10/18/21 2103 10/18/21 2112 10/19/21 0518  BP: 106/69 106/71 108/70 (!) 105/58  Pulse: 99 100 100 98  Resp: 18 18 20 19   Temp: 98.8 F (37.1 C) 99.5 F (37.5 C) 99.1 F (37.3 C) 98.5 F (36.9 C)  TempSrc: Oral Oral Oral   SpO2: 99% 97% 96% 98%  Weight:      Height:        Intake/Output Summary (Last 24 hours) at 10/19/2021 1133 Last data filed at 10/19/2021 1100 Gross per 24 hour  Intake 2093.1 ml  Output 1250 ml  Net 843.1 ml   Filed Weights   10/17/21 1841 10/18/21 0117  Weight: 76.2 kg 76 kg    Examination:  General exam: Appears calm and comfortable  Chronically sick looking.  Not in any distress.  Frail and debilitated. Respiratory system: Clear to auscultation. Respiratory effort normal. Cardiovascular system: S1 & S2 heard, RRR.  Gastrointestinal system: Abdomen is nondistended, soft and nontender.  No organomegaly or masses felt. Normal bowel sounds heard. Central nervous system: Alert and oriented. No focal neurological  deficits. Extremities: Symmetric 5 x 5 power. Skin:  Right foot with transmetatarsal amputation.  Small callus on the plantar aspect with no active infection. Left foot with multiple draining ulcers, redness and inflammation and wet gangrene of the distal phalanxes.  Erythema extending up to the ankle.    Data Reviewed: I have personally reviewed following labs and imaging studies  CBC: Recent Labs  Lab 10/17/21 1848 10/18/21 0201 10/19/21 0416  WBC 17.6* 13.5* 13.6*  NEUTROABS  --  11.6* 11.7*  HGB 10.0* 7.9* 7.2*  HCT 30.8* 24.2* 23.0*  MCV 80.4 79.9* 82.7  PLT 446* 362 808   Basic Metabolic Panel: Recent Labs  Lab 10/17/21 1848 10/18/21 0201 10/19/21 0416  NA 128* 129* 133*  K 2.8* 3.1* 3.6  CL 86* 92* 101  CO2 25 25 23   GLUCOSE 380* 410* 134*  BUN 17 14 14   CREATININE 1.25* 1.09* 1.01*  CALCIUM 8.9 7.9* 8.1*  MG  --  1.4* 1.9  PHOS  --   --  1.5*   GFR: Estimated Creatinine Clearance: 76.6 mL/min (A) (by C-G formula based on SCr of 1.01 mg/dL (H)). Liver Function Tests: Recent Labs  Lab 10/17/21 1848 10/18/21 0201  AST 13* 13*  ALT 12 11  ALKPHOS 77 65  BILITOT 1.0 0.5  PROT 9.0* 7.1  ALBUMIN 3.3* 2.7*   Recent Labs  Lab 10/17/21 1848  LIPASE 19   No results for input(s): AMMONIA in the last 168 hours. Coagulation Profile: No results for input(s): INR, PROTIME in the last 168 hours. Cardiac Enzymes: No results for input(s): CKTOTAL, CKMB, CKMBINDEX, TROPONINI in the last 168 hours. BNP (last 3 results) No results for input(s): PROBNP in the last 8760 hours. HbA1C: No results for input(s): HGBA1C in the last 72 hours. CBG: Recent Labs  Lab 10/18/21 1703 10/18/21 2116 10/19/21 0010 10/19/21 0523 10/19/21 1115  GLUCAP 226* 141* 173* 109* 249*   Lipid Profile: No results for input(s): CHOL, HDL, LDLCALC, TRIG, CHOLHDL, LDLDIRECT in the last 72 hours. Thyroid Function Tests: No results for input(s): TSH, T4TOTAL, FREET4, T3FREE,  THYROIDAB in the last 72 hours. Anemia Panel: No results for input(s): VITAMINB12, FOLATE, FERRITIN, TIBC, IRON, RETICCTPCT in the last 72 hours. Sepsis Labs: Recent Labs  Lab 10/17/21 2259 10/18/21 0200  LATICACIDVEN 1.0 1.3    Recent Results (from the past 240 hour(s))  Resp Panel by RT-PCR (Flu A&B, Covid) Nasopharyngeal Swab     Status: None   Collection Time: 10/17/21 11:10 PM   Specimen: Nasopharyngeal Swab; Nasopharyngeal(NP) swabs in vial transport medium  Result Value Ref Range Status   SARS Coronavirus 2 by RT PCR NEGATIVE NEGATIVE Final    Comment: (NOTE) SARS-CoV-2 target nucleic acids are NOT DETECTED.  The SARS-CoV-2 RNA is generally detectable in upper respiratory specimens during the acute phase of infection. The lowest concentration of SARS-CoV-2 viral copies this assay can detect is 138 copies/mL. A negative result does not preclude SARS-Cov-2 infection and should not be used as the sole basis for treatment or other patient management decisions. A negative result may occur with  improper specimen collection/handling, submission of specimen other than nasopharyngeal swab, presence of viral mutation(s) within the areas targeted by this assay, and inadequate number of viral copies(<138 copies/mL). A negative result must be combined with clinical observations, patient history, and epidemiological information. The expected result is Negative.  Fact  Sheet for Patients:  EntrepreneurPulse.com.au  Fact Sheet for Healthcare Providers:  IncredibleEmployment.be  This test is no t yet approved or cleared by the [place] FDA and  has been authorized for detection and/or diagnosis of SARS-CoV-2 by FDA under an Emergency Use Authorization (EUA). This EUA will remain  in effect (meaning this test can be used) for the duration of the COVID-19 declaration under Section 564(b)(1) of the Act, 21 U.S.C.section 360bbb-3(b)(1), unless the  authorization is terminated  or revoked sooner.       Influenza A by PCR NEGATIVE NEGATIVE Final   Influenza B by PCR NEGATIVE NEGATIVE Final    Comment: (NOTE) The Xpert Xpress SARS-CoV-2/FLU/RSV plus assay is intended as an aid in the diagnosis of influenza from Nasopharyngeal swab specimens and should not be used as a sole basis for treatment. Nasal washings and aspirates are unacceptable for Xpert Xpress SARS-CoV-2/FLU/RSV testing.  Fact Sheet for Patients: EntrepreneurPulse.com.au  Fact Sheet for Healthcare Providers: IncredibleEmployment.be  This test is not yet approved or cleared by the [place] FDA and has been authorized for detection and/or diagnosis of SARS-CoV-2 by FDA under an Emergency Use Authorization (EUA). This EUA will remain in effect (meaning this test can be used) for the duration of the COVID-19 declaration under Section 564(b)(1) of the Act, 21 U.S.C. section 360bbb-3(b)(1), unless the authorization is terminated or revoked.  Performed at [facility] [address], [place], [place] [address]          Radiology Studies: DG Ankle Complete Left  Result Date: 10/17/2021 CLINICAL DATA:  Initial evaluation for acute redness, pain. History of osteomyelitis. EXAM: LEFT ANKLE COMPLETE - 3+ VIEW COMPARISON:  None. FINDINGS: No acute fracture or dislocation. Ankle mortise approximated. No erosive changes to suggest acute osteomyelitis about the ankle. No visible soft tissue abnormality. Few scattered vascular calcifications noted. Age-indeterminate erosive changes about the fifth metatarsal head, partially visualized, and better evaluated on concomitant radiograph of foot. IMPRESSION: 1. No acute osseous abnormality about the left ankle. No radiographic evidence for acute osteomyelitis. 2. Age-indeterminate erosive changes about the fifth metatarsal head, better evaluated on concomitant radiograph of the foot.  Electronically Signed   By: Jeannine Boga M.D.   On: 10/17/2021 23:50   DG Foot Complete Left  Result Date: 10/17/2021 CLINICAL DATA:  Initial evaluation for redness, swelling. History of osteomyelitis. EXAM: LEFT FOOT - COMPLETE 3+ VIEW COMPARISON:  Prior radiograph from 07/01/2018. FINDINGS: There has been progressive osseous erosion about the fifth metatarsal head since previous exam. Progressive erosion and/or postsurgical changes seen about the left fifth proximal, middle, and distal phalanges as well. Finding of uncertain acuity, and could reflect progressive but chronic changes of osteomyelitis. There is additional osseous erosion centered about the left second PIP joint, concerning for osteomyelitis. Erosive changes with lucency extends to involve the adjacent second middle and proximal phalanges. Overlying soft tissue swelling. No visible soft tissue emphysema. Possible superimposed oblique fracture extending through the base of the left second proximal phalanx with intra-articular extension noted. No other acute osseous abnormality about the foot. 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 Small posterior calcaneal enthesophyte. No other soft tissue abnormality. Scattered vascular calcifications noted. IMPRESSION: 1. Osseous erosive changes centered about the left second PIP joint, concerning for acute osteomyelitis. 2. Possible superimposed oblique fracture extending through the base of the left second proximal phalanx with intra-articular extension. 3. Progressive osseous erosion about the left fifth metatarsal head as well as the fifth proximal, middle, and distal  phalanges. Finding is of uncertain acuity, and could reflect progressive but chronic changes of osteomyelitis. Electronically Signed   By: Jeannine Boga M.D.   On: 10/17/2021 23:48   ECHOCARDIOGRAM COMPLETE  Result Date: 10/18/2021    ECHOCARDIOGRAM REPORT   Patient Name:   [name] Date of Exam:  10/18/2021 Medical Rec #:  [id number]        Height:       69.0 in Accession #:    [id number]       Weight:       167.5 lb Date of Birth:  09/13/1981        BSA:          1.916 m Patient Age:    58 years         BP:           108/64 mmHg Patient Gender: F                HR:           98 bpm. Exam Location:  [facility] Procedure: 2D Echo, Cardiac Doppler and Color Doppler Indications:    Congestive Heart Failure I50.9  History:        Patient has prior history of Echocardiogram examinations, most                 recent 07/08/2020. CHF, CAD and Previous Myocardial Infarction;                 Risk Factors:Hypertension, Diabetes and Dyslipidemia. S/P PTCA                 (percutaneous transluminal coronary angioplasty).  Sonographer:    Alvino Chapel RCS Referring Phys: [id number] [place]  1. Hypokinesis of the mid-apical anterolateral, apical inferolatera, apical anterior, and apical myocardium. Left ventricular ejection fraction, by estimation, is 45 to 50%. The left ventricle has mildly decreased function. The left ventricle demonstrates regional wall motion abnormalities (see scoring diagram/findings for description). Left ventricular diastolic parameters are consistent with Grade II diastolic dysfunction (pseudonormalization).  2. Right ventricular systolic function is normal. The right ventricular size is normal.  3. The mitral valve is normal in structure. No evidence of mitral valve regurgitation. No evidence of mitral stenosis.  4. The aortic valve is tricuspid. Aortic valve regurgitation is not visualized. No aortic stenosis is present.  5. The inferior vena cava is normal in size with greater than 50% respiratory variability, suggesting right atrial pressure of 3 mmHg. FINDINGS  Left Ventricle: Hypokinesis of the mid-apical anterolateral, apical inferolatera, apical anterior, and apical myocardium. Left ventricular ejection fraction, by estimation, is 45 to 50%. The left ventricle has mildly  decreased function. The left ventricle demonstrates regional wall motion abnormalities. The left ventricular internal cavity size was normal in size. There is no left ventricular hypertrophy. Left ventricular diastolic parameters are consistent with Grade II diastolic dysfunction (pseudonormalization). Right Ventricle: The right ventricular size is normal. No increase in right ventricular wall thickness. Right ventricular systolic function is normal. Left Atrium: Left atrial size was normal in size. Right Atrium: Right atrial size was normal in size. Pericardium: There is no evidence of pericardial effusion. Mitral Valve: The mitral valve is normal in structure. No evidence of mitral valve regurgitation. No evidence of mitral valve stenosis. Tricuspid Valve: The tricuspid valve is normal in structure. Tricuspid valve regurgitation is not demonstrated. No evidence of tricuspid stenosis. Aortic Valve: The aortic valve is tricuspid. Aortic valve regurgitation is  not visualized. No aortic stenosis is present. Pulmonic Valve: The pulmonic valve was normal in structure. Pulmonic valve regurgitation is not visualized. No evidence of pulmonic stenosis. Aorta: The aortic root is normal in size and structure. Venous: The inferior vena cava is normal in size with greater than 50% respiratory variability, suggesting right atrial pressure of 3 mmHg. IAS/Shunts: No atrial level shunt detected by color flow Doppler.  LEFT VENTRICLE PLAX 2D LVIDd:         4.70 cm   Diastology LVIDs:         3.10 cm   LV e' medial:    6.53 cm/s LV PW:         1.00 cm   LV E/e' medial:  17.5 LV IVS:        0.90 cm   LV e' lateral:   7.62 cm/s LVOT diam:     2.00 cm   LV E/e' lateral: 15.0 LV SV:         61 LV SV Index:   32 LVOT Area:     3.14 cm  RIGHT VENTRICLE RV S prime:     13.20 cm/s TAPSE (M-mode): 1.9 cm LEFT ATRIUM             Index        RIGHT ATRIUM           Index LA diam:        4.20 cm 2.19 cm/m   RA Area:     14.70 cm LA Vol (A2C):    41.2 ml 21.50 ml/m  RA Volume:   34.80 ml  18.16 ml/m LA Vol (A4C):   46.5 ml 24.27 ml/m LA Biplane Vol: 48.1 ml 25.10 ml/m  AORTIC VALVE LVOT Vmax:   108.00 cm/s LVOT Vmean:  68.500 cm/s LVOT VTI:    0.193 m  AORTA Ao Root diam: 3.50 cm MITRAL VALVE MV Area (PHT): 4.06 cm     SHUNTS MV Decel Time: 187 msec     Systemic VTI:  0.19 m MV E velocity: 114.00 cm/s  Systemic Diam: 2.00 cm MV A velocity: 101.00 cm/s MV E/A ratio:  1.13 Skeet Latch MD Electronically signed by Skeet Latch MD Signature Date/Time: 10/18/2021/4:19:07 PM    Final         Scheduled Meds:  aspirin EC  81 mg Oral Q breakfast   atorvastatin  80 mg Oral QPM   carvedilol  3.125 mg Oral BID   ezetimibe  10 mg Oral Daily   fenofibrate  54 mg Oral Daily   heparin  5,000 Units Subcutaneous Q8H   insulin aspart  0-15 Units Subcutaneous TID WC   insulin aspart  0-5 Units Subcutaneous QHS   insulin aspart  15 Units Subcutaneous TID WC   insulin detemir  40 Units Subcutaneous QHS   isosorbide mononitrate  30 mg Oral Daily   losartan  25 mg Oral QPM   metroNIDAZOLE  500 mg Oral Q12H   potassium chloride  40 mEq Oral BID   pregabalin  75 mg Oral Daily   sertraline  200 mg Oral QHS   Continuous Infusions:  ceFEPime (MAXIPIME) IV 2 g (10/19/21 1114)   potassium PHOSPHATE IVPB (in mmol)     promethazine (PHENERGAN) injection (IM or IVPB) 12.5 mg (10/19/21 1610)   vancomycin 1,500 mg (10/18/21 2330)     LOS: 1 day    Time spent: 35 minutes    Barb Merino, MD [facility] Pager [phone]

## 2021-10-19 NOTE — Progress Notes (Signed)
Subjective: Patient does feel better.  She has minimal sensation in the left foot.  Her blood sugars are improved.  Objective: Vital signs in last 24 hours: Temp:  [98.5 F (36.9 C)-99.5 F (37.5 C)] 98.5 F (36.9 C) (11/27 0518) Pulse Rate:  [98-100] 98 (11/27 0518) Resp:  [18-20] 19 (11/27 0518) BP: (105-108)/(58-71) 105/58 (11/27 0518) SpO2:  [96 %-99 %] 98 % (11/27 0518)    Intake/Output from previous day: 11/26 0701 - 11/27 0700 In: 1573.1 [P.O.:1030; IV Piggyback:543.1] Out: 550 [Urine:550] Intake/Output this shift: Total I/O In: -  Out: 400 [Urine:400]  General appearance: alert, cooperative, and no distress Extremities: Slight improvement in the swelling of the left foot, with still significant soft tissue bruising and edema along the sole of the foot. Left second toe with gangrenous changes present.  Lab Results:  Recent Labs    10/18/21 0201 10/19/21 0416  WBC 13.5* 13.6*  HGB 7.9* 7.2*  HCT 24.2* 23.0*  PLT 362 359   BMET Recent Labs    10/18/21 0201 10/19/21 0416  NA 129* 133*  K 3.1* 3.6  CL 92* 101  CO2 25 23  GLUCOSE 410* 134*  BUN 14 14  CREATININE 1.09* 1.01*  CALCIUM 7.9* 8.1*   PT/INR No results for input(s): LABPROT, INR in the last 72 hours.  Studies/Results: DG Ankle Complete Left  Result Date: 10/17/2021 CLINICAL DATA:  Initial evaluation for acute redness, pain. History of osteomyelitis. EXAM: LEFT ANKLE COMPLETE - 3+ VIEW COMPARISON:  None. FINDINGS: No acute fracture or dislocation. Ankle mortise approximated. No erosive changes to suggest acute osteomyelitis about the ankle. No visible soft tissue abnormality. Few scattered vascular calcifications noted. Age-indeterminate erosive changes about the fifth metatarsal head, partially visualized, and better evaluated on concomitant radiograph of foot. IMPRESSION: 1. No acute osseous abnormality about the left ankle. No radiographic evidence for acute osteomyelitis. 2.  Age-indeterminate erosive changes about the fifth metatarsal head, better evaluated on concomitant radiograph of the foot. Electronically Signed   By: Jeannine Boga M.D.   On: 10/17/2021 23:50   DG Foot Complete Left  Result Date: 10/17/2021 CLINICAL DATA:  Initial evaluation for redness, swelling. History of osteomyelitis. EXAM: LEFT FOOT - COMPLETE 3+ VIEW COMPARISON:  Prior radiograph from 07/01/2018. FINDINGS: There has been progressive osseous erosion about the fifth metatarsal head since previous exam. Progressive erosion and/or postsurgical changes seen about the left fifth proximal, middle, and distal phalanges as well. Finding of uncertain acuity, and could reflect progressive but chronic changes of osteomyelitis. There is additional osseous erosion centered about the left second PIP joint, concerning for osteomyelitis. Erosive changes with lucency extends to involve the adjacent second middle and proximal phalanges. Overlying soft tissue swelling. No visible soft tissue emphysema. Possible superimposed oblique fracture extending through the base of the left second proximal phalanx with intra-articular extension noted. No other acute osseous abnormality about the foot. 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 Small posterior calcaneal enthesophyte. No other soft tissue abnormality. Scattered vascular calcifications noted. IMPRESSION: 1. Osseous erosive changes centered about the left second PIP joint, concerning for acute osteomyelitis. 2. Possible superimposed oblique fracture extending through the base of the left second proximal phalanx with intra-articular extension. 3. Progressive osseous erosion about the left fifth metatarsal head as well as the fifth proximal, middle, and distal phalanges. Finding is of uncertain acuity, and could reflect progressive but chronic changes of osteomyelitis. Electronically Signed   By: Jeannine Boga M.D.   On: 10/17/2021 23:48  ECHOCARDIOGRAM COMPLETE  Result Date: 10/18/2021    ECHOCARDIOGRAM REPORT   Patient Name:   Anne [name] Date of Exam: 10/18/2021 Medical Rec #:  [id number]        Height:       69.0 in Accession #:    [id number]       Weight:       167.5 lb Date of Birth:  1981-09-09        BSA:          1.916 m Patient Age:    40 years         BP:           108/64 mmHg Patient Gender: F                HR:           98 bpm. Exam Location:  [facility] Procedure: 2D Echo, Cardiac Doppler and Color Doppler Indications:    Congestive Heart Failure I50.9  History:        Patient has prior history of Echocardiogram examinations, most                 recent 07/08/2020. CHF, CAD and Previous Myocardial Infarction;                 Risk Factors:Hypertension, Diabetes and Dyslipidemia. S/P PTCA                 (percutaneous transluminal coronary angioplasty).  Sonographer:    Alvino Chapel RCS Referring Phys: [id number] [place]  1. Hypokinesis of the mid-apical anterolateral, apical inferolatera, apical anterior, and apical myocardium. Left ventricular ejection fraction, by estimation, is 45 to 50%. The left ventricle has mildly decreased function. The left ventricle demonstrates regional wall motion abnormalities (see scoring diagram/findings for description). Left ventricular diastolic parameters are consistent with Grade II diastolic dysfunction (pseudonormalization).  2. Right ventricular systolic function is normal. The right ventricular size is normal.  3. The mitral valve is normal in structure. No evidence of mitral valve regurgitation. No evidence of mitral stenosis.  4. The aortic valve is tricuspid. Aortic valve regurgitation is not visualized. No aortic stenosis is present.  5. The inferior vena cava is normal in size with greater than 50% respiratory variability, suggesting right atrial pressure of 3 mmHg. FINDINGS  Left Ventricle: Hypokinesis of the mid-apical anterolateral, apical inferolatera, apical  anterior, and apical myocardium. Left ventricular ejection fraction, by estimation, is 45 to 50%. The left ventricle has mildly decreased function. The left ventricle demonstrates regional wall motion abnormalities. The left ventricular internal cavity size was normal in size. There is no left ventricular hypertrophy. Left ventricular diastolic parameters are consistent with Grade II diastolic dysfunction (pseudonormalization). Right Ventricle: The right ventricular size is normal. No increase in right ventricular wall thickness. Right ventricular systolic function is normal. Left Atrium: Left atrial size was normal in size. Right Atrium: Right atrial size was normal in size. Pericardium: There is no evidence of pericardial effusion. Mitral Valve: The mitral valve is normal in structure. No evidence of mitral valve regurgitation. No evidence of mitral valve stenosis. Tricuspid Valve: The tricuspid valve is normal in structure. Tricuspid valve regurgitation is not demonstrated. No evidence of tricuspid stenosis. Aortic Valve: The aortic valve is tricuspid. Aortic valve regurgitation is not visualized. No aortic stenosis is present. Pulmonic Valve: The pulmonic valve was normal in structure. Pulmonic valve regurgitation is not visualized. No evidence of pulmonic stenosis. Aorta: The aortic root is  normal in size and structure. Venous: The inferior vena cava is normal in size with greater than 50% respiratory variability, suggesting right atrial pressure of 3 mmHg. IAS/Shunts: No atrial level shunt detected by color flow Doppler.  LEFT VENTRICLE PLAX 2D LVIDd:         4.70 cm   Diastology LVIDs:         3.10 cm   LV e' medial:    6.53 cm/s LV PW:         1.00 cm   LV E/e' medial:  17.5 LV IVS:        0.90 cm   LV e' lateral:   7.62 cm/s LVOT diam:     2.00 cm   LV E/e' lateral: 15.0 LV SV:         61 LV SV Index:   32 LVOT Area:     3.14 cm  RIGHT VENTRICLE RV S prime:     13.20 cm/s TAPSE (M-mode): 1.9 cm LEFT ATRIUM              Index        RIGHT ATRIUM           Index LA diam:        4.20 cm 2.19 cm/m   RA Area:     14.70 cm LA Vol (A2C):   41.2 ml 21.50 ml/m  RA Volume:   34.80 ml  18.16 ml/m LA Vol (A4C):   46.5 ml 24.27 ml/m LA Biplane Vol: 48.1 ml 25.10 ml/m  AORTIC VALVE LVOT Vmax:   108.00 cm/s LVOT Vmean:  68.500 cm/s LVOT VTI:    0.193 m  AORTA Ao Root diam: 3.50 cm MITRAL VALVE MV Area (PHT): 4.06 cm     SHUNTS MV Decel Time: 187 msec     Systemic VTI:  0.19 m MV E velocity: 114.00 cm/s  Systemic Diam: 2.00 cm MV A velocity: 101.00 cm/s MV E/A ratio:  1.13 Skeet Latch MD Electronically signed by Skeet Latch MD Signature Date/Time: 10/18/2021/4:19:07 PM    Final     Anti-infectives: Anti-infectives (From admission, onward)    Start     Dose/Rate Route Frequency Ordered Stop   10/18/21 0015  metroNIDAZOLE (FLAGYL) tablet 500 mg        500 mg Oral Every 12 hours 10/18/21 0003 10/24/21 2159   10/18/21 0015  vancomycin (VANCOREADY) IVPB 1500 mg/300 mL        1,500 mg 150 mL/hr over 120 Minutes Intravenous Daily at bedtime 10/18/21 0010 10/24/21 2159   10/18/21 0015  ceFEPIme (MAXIPIME) 2 g in sodium chloride 0.9 % 100 mL IVPB        2 g 200 mL/hr over 30 Minutes Intravenous Every 8 hours 10/18/21 0010 10/25/21 0014       Assessment/Plan: Impression: Cellulitis with ischemia, left foot.  Initial ABI appears to be okay, but a final reading is pending.  A 2D echo has been performed and cardiology consultation is pending. Plan: Continue current treatment regimen.  LOS: 1 day    [facility] Signs 10/19/2021

## 2021-10-20 DIAGNOSIS — Z01810 Encounter for preprocedural cardiovascular examination: Secondary | ICD-10-CM | POA: Diagnosis not present

## 2021-10-20 DIAGNOSIS — I5020 Unspecified systolic (congestive) heart failure: Secondary | ICD-10-CM

## 2021-10-20 DIAGNOSIS — M869 Osteomyelitis, unspecified: Secondary | ICD-10-CM | POA: Diagnosis not present

## 2021-10-20 DIAGNOSIS — I2510 Atherosclerotic heart disease of native coronary artery without angina pectoris: Secondary | ICD-10-CM | POA: Diagnosis not present

## 2021-10-20 LAB — BASIC METABOLIC PANEL
Anion gap: 7 (ref 5–15)
BUN: 14 mg/dL (ref 6–20)
CO2: 21 mmol/L — ABNORMAL LOW (ref 22–32)
Calcium: 8.4 mg/dL — ABNORMAL LOW (ref 8.9–10.3)
Chloride: 105 mmol/L (ref 98–111)
Creatinine, Ser: 0.99 mg/dL (ref 0.44–1.00)
GFR, Estimated: >60 mL/min (ref >60)
Glucose, Bld: 84 mg/dL (ref 70–99)
Potassium: 4.6 mmol/L (ref 3.5–5.1)
Sodium: 133 mmol/L — ABNORMAL LOW (ref 135–145)

## 2021-10-20 LAB — GLUCOSE, CAPILLARY
Glucose-Capillary: 74 mg/dL (ref 70–99)
Glucose-Capillary: 103 mg/dL — ABNORMAL HIGH (ref 70–99)
Glucose-Capillary: 160 mg/dL — ABNORMAL HIGH (ref 70–99)
Glucose-Capillary: 86 mg/dL (ref 70–99)

## 2021-10-20 LAB — PREPARE RBC (CROSSMATCH)

## 2021-10-20 LAB — HEMOGLOBIN A1C
Hgb A1c MFr Bld: 12.7 % — ABNORMAL HIGH (ref 4.8–5.6)
Hgb A1c MFr Bld: 12.9 % — ABNORMAL HIGH (ref 4.8–5.6)
Mean Plasma Glucose: 318 mg/dL
Mean Plasma Glucose: 324 mg/dL

## 2021-10-20 LAB — IRON AND TIBC
Iron: 8 ug/dL — ABNORMAL LOW (ref 28–170)
Saturation Ratios: 4 % — ABNORMAL LOW (ref 10.4–31.8)
TIBC: 196 ug/dL — ABNORMAL LOW (ref 250–450)
UIBC: 188 ug/dL

## 2021-10-20 LAB — CBC WITH DIFFERENTIAL/PLATELET
Abs Immature Granulocytes: 0.06 10*3/uL (ref 0.00–0.07)
Basophils Absolute: 0 10*3/uL (ref 0.0–0.1)
Basophils Relative: 0 %
Eosinophils Absolute: 0.2 10*3/uL (ref 0.0–0.5)
Eosinophils Relative: 2 %
HCT: 21.7 % — ABNORMAL LOW (ref 36.0–46.0)
Hemoglobin: 6.8 g/dL — CL (ref 12.0–15.0)
Immature Granulocytes: 1 %
Lymphocytes Relative: 10 %
Lymphs Abs: 1 10*3/uL (ref 0.7–4.0)
MCH: 26 pg (ref 26.0–34.0)
MCHC: 31.3 g/dL (ref 30.0–36.0)
MCV: 82.8 fL (ref 80.0–100.0)
Monocytes Absolute: 0.7 10*3/uL (ref 0.1–1.0)
Monocytes Relative: 6 %
Neutro Abs: 8.9 10*3/uL — ABNORMAL HIGH (ref 1.7–7.7)
Neutrophils Relative %: 81 %
Platelets: 350 10*3/uL (ref 150–400)
RBC: 2.62 MIL/uL — ABNORMAL LOW (ref 3.87–5.11)
RDW: 14.4 % (ref 11.5–15.5)
WBC: 10.9 10*3/uL — ABNORMAL HIGH (ref 4.0–10.5)
nRBC: 0 % (ref 0.0–0.2)

## 2021-10-20 LAB — ABO/RH: ABO/RH(D): A POS

## 2021-10-20 LAB — FOLATE: Folate: 7 ng/mL (ref >5.9)

## 2021-10-20 LAB — PHOSPHORUS: Phosphorus: 2.4 mg/dL — ABNORMAL LOW (ref 2.5–4.6)

## 2021-10-20 LAB — FERRITIN: Ferritin: 186 ng/mL (ref 11–307)

## 2021-10-20 LAB — HEMOGLOBIN AND HEMATOCRIT, BLOOD
HCT: 25.6 % — ABNORMAL LOW (ref 36.0–46.0)
Hemoglobin: 7.8 g/dL — ABNORMAL LOW (ref 12.0–15.0)

## 2021-10-20 LAB — VITAMIN B12: Vitamin B-12: 793 pg/mL (ref 180–914)

## 2021-10-20 MED ORDER — ASCORBIC ACID 500 MG PO TABS
500.0000 mg | ORAL_TABLET | Freq: Two times a day (BID) | ORAL | Status: DC
  Administered 2021-10-20 – 2021-10-28 (×17): 500 mg via ORAL
  Filled 2021-10-20 (×18): qty 1

## 2021-10-20 MED ORDER — ZINC SULFATE 220 (50 ZN) MG PO CAPS
220.0000 mg | ORAL_CAPSULE | Freq: Every day | ORAL | Status: DC
  Administered 2021-10-20 – 2021-10-28 (×9): 220 mg via ORAL
  Filled 2021-10-20 (×9): qty 1

## 2021-10-20 MED ORDER — ENSURE MAX PROTEIN PO LIQD
11.0000 [oz_av] | Freq: Two times a day (BID) | ORAL | Status: DC
  Administered 2021-10-20: 11:00:00 330 mL via ORAL
  Administered 2021-10-21 – 2021-10-26 (×7): 11 [oz_av] via ORAL

## 2021-10-20 MED ORDER — SODIUM CHLORIDE 0.9% IV SOLUTION: Freq: Once | INTRAVENOUS | Status: AC

## 2021-10-20 MED ORDER — VANCOMYCIN HCL 1000 MG/200ML IV SOLN
1000.0000 mg | Freq: Two times a day (BID) | INTRAVENOUS | Status: DC
Start: 2021-10-20 — End: 2021-10-25
  Administered 2021-10-20 – 2021-10-25 (×10): 1000 mg via INTRAVENOUS
  Filled 2021-10-20 (×9): qty 200

## 2021-10-20 MED ORDER — K PHOS MONO-SOD PHOS DI & MONO 155-852-130 MG PO TABS
500.0000 mg | ORAL_TABLET | Freq: Three times a day (TID) | ORAL | Status: DC
  Administered 2021-10-20 – 2021-10-28 (×24): 500 mg via ORAL
  Filled 2021-10-20 (×22): qty 2

## 2021-10-20 NOTE — Consult Note (Addendum)
Cardiology Consultation:   Patient ID: Anne [name] MRN: [id number]; DOB: 1981/05/22  Admit date: 10/17/2021 Date of Consult: 10/20/2021  PCP:  Alfonse Flavors, MD   [facility] Providers Cardiologist:  Dorris Carnes, MD  Electrophysiologist:  Cristopher Peru, MD       Patient Profile:   Anne [name] is a 40 y.o. female with a hx of CAD,ICM who is being seen 10/20/2021 for the evaluation of preop clearance at the request of Dr. Sloan Leiter.  History of Present Illness:   Anne [name] with a history of GERD, anxiety, OA, CKD, Type II DM, gastroparesis, HL, HTN, CAD  anterior MI in 201912/19 >> LHC - dLM 25, mLAD 99, oOM2 100 (R-L collats), irreg RCA, EF 25-35 >> PCI: POBA to mLAD; s/p revasculariazation with POBA to LAD  ), ischemic cardiovmyopathy (ICD not put in at the time because of infection),  Dressler syndrome.  Patient last saw Dr. Harrington Challenger 03/2020 and ordered repeat echo done 06/2020 EF 35-40%.  Patient comes in with cellulitis and ischemic left foot/osteomyolitis and needs surgery. Echo today LVEF 45-50% with grade 2 DD. Patient is pretty much in a wheelchair and occasionally "hobbles around." Denies chest pain but has chronic DOE and some dizziness when standing-more pronounced recently. No syncope or palpitations. Has lost 50 lbs in past year from gastroparesis and eating anything she can but A1C up to 12.      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 [facility])    CAD (coronary artery disease) 11/13/2018   Late presentation anterior MI 12/19 >> LHC - dLM 25, mLAD 99, oOM2 100 (R-L collats), irreg RCA, EF 25-35 >> PCI: POBA to mLAD   Chronic systolic CHF (congestive heart failure) ([place]) 11/28/2018   Ischemic CM // late presentation ant MI 10/2018 tx with POBA to LAD (residual CAD with CTO of the OM2) // Echo 12/19:  No mural apical thrombus, septal, apical mid ant and inf HK;  mild LVH, EF 30-35, mild MR // Echo 01/2019: EF 30-35, Gr 1 DD, diff HK, apical AK, mild MR    CKD (chronic kidney disease), stage I    Diabetes mellitus type 1 ([place])    Dyspnea    Former tobacco use    Gastroparesis    Hypercholesteremia    Hypertension    Myocardial infarction ([place]) 2019   Osteomyelitis ([place])    a. s/p R forefoot amputation.   Renal failure    Sciatica     Past Surgical History:  Procedure Laterality Date   AMPUTATION Right 11/03/2011   Procedure: AMPUTATION RAY;  Surgeon: Jamesetta So;  Location: [facility];  Service: General;  Laterality: Right;  Right fourth and fifth metatarsal    APPENDECTOMY     CARDIAC CATHETERIZATION  10/2018   CESAREAN SECTION  2004 and 2007   x2   CORONARY/GRAFT ACUTE MI REVASCULARIZATION N/A 11/11/2018   Procedure: CORONARY/GRAFT ACUTE MI REVASCULARIZATION;  Surgeon: Jettie Booze, MD;  Location: [place] [facility];  Service: Cardiovascular;  Laterality: N/A;   FRACTURE SURGERY  2000   INJECTION OF SILICONE OIL Right 53/97/6734   Procedure: Injection Of Silicone Oil;  Surgeon: Bernarda Caffey, MD;  Location: [place];  Service: Ophthalmology;  Laterality: Right;   LEFT HEART CATH AND CORONARY ANGIOGRAPHY N/A 11/11/2018   Procedure: LEFT HEART CATH AND CORONARY  ANGIOGRAPHY;  Surgeon: Jettie Booze, MD;  Location: [place] [facility];  Service: Cardiovascular;  Laterality: N/A;   MEMBRANE PEEL Right 09/14/2019   Procedure: Antoine Primas;  Surgeon: Bernarda Caffey, MD;  Location: [place];  Service: Ophthalmology;  Laterality: Right;   PARS PLANA VITRECTOMY Right 09/14/2019   Procedure: Pars Plana Vitrectomy With 25 Gauge;  Surgeon: Bernarda Caffey, MD;  Location: [place];  Service: Ophthalmology;  Laterality: Right;   PHOTOCOAGULATION WITH LASER Right 09/14/2019   Procedure: Photocoagulation With Laser;  Surgeon: Bernarda Caffey, MD;  Location: [place];  Service: Ophthalmology;  Laterality: Right;   REPAIR OF COMPLEX TRACTION RETINAL DETACHMENT  Right 09/14/2019   Procedure: REPAIR OF COMPLEX TRACTION RETINAL DETACHMENT;  Surgeon: Bernarda Caffey, MD;  Location: [place];  Service: Ophthalmology;  Laterality: Right;   TUBAL LIGATION       Home Medications:  Prior to Admission medications   Medication Sig Start Date End Date Taking? Authorizing Provider  albuterol (VENTOLIN HFA) 108 (90 Base) MCG/ACT inhaler Inhale 2 puffs into the lungs every 6 (six) hours as needed for wheezing or shortness of breath.   Yes [provider]  aspirin (GNP ASPIRIN LOW DOSE) 81 MG EC tablet Take 1 tablet (81 mg total) by mouth daily with breakfast. Swallow whole. 05/04/19  Yes Roxan Hockey, MD  atorvastatin (LIPITOR) 80 MG tablet Take 1 tablet (80 mg total) by mouth every evening. 05/04/19  Yes Emokpae, Courage, MD  BAQSIMI TWO PACK 3 MG/DOSE POWD Place 3 mg into the nose once as needed (for emergency low blood sugar levels).  01/24/20  Yes [provider]  BYDUREON BCISE 2 MG/0.85ML AUIJ Inject 2 mg into the skin every Thursday. 12/26/19  Yes [provider]  carvedilol (COREG) 3.125 MG tablet Take 1 tablet (3.125 mg total) by mouth 2 (two) times daily. 01/31/21 10/18/21 Yes Fay Records, MD  ezetimibe (ZETIA) 10 MG tablet TAKE 1 TABLET BY MOUTH ONCE DAILY. 03/28/21  Yes Fay Records, MD  fenofibrate (TRICOR) 145 MG tablet Take 1 tablet (145 mg total) by mouth daily. 12/26/19  Yes Daune Perch, NP  furosemide (LASIX) 40 MG tablet Take 1 tablet (40 mg total) by mouth 2 (two) times daily. 05/04/19 10/18/21 Yes Emokpae, Courage, MD  isosorbide mononitrate (IMDUR) 30 MG 24 hr tablet TAKE 1 TABLET BY MOUTH ONCE DAILY. 06/20/21  Yes Fay Records, MD  LANTUS SOLOSTAR 100 UNIT/ML Solostar Pen INNJECT 50 UNITS S.Q. ONCE DAILY AT 10 P.M. Patient taking differently: Inject 50 Units into the skin at bedtime. 02/03/16  Yes Nida, Marella Chimes, MD  linezolid (ZYVOX) 600 MG tablet Take 600 mg by mouth 2 (two) times daily. 08/13/21  Yes [provider]  losartan (COZAAR) 25 MG tablet Take 1 tablet (25 mg total) by mouth every evening. 05/04/19  Yes Emokpae, Courage, MD  metFORMIN (GLUCOPHAGE) 1000 MG tablet Take 1,000 mg by mouth 2 (two) times daily with a meal.   Yes [provider]  metoCLOPramide (REGLAN) 5 MG tablet Take 1 tablet (5 mg total) by mouth 3 (three) times daily before meals. 03/12/20  Yes Barton Dubois, MD  nitroGLYCERIN (NITROSTAT) 0.4 MG SL tablet Place 1 tablet (0.4 mg total) under the tongue every 5 (five) minutes as needed. 05/04/19  Yes Emokpae, Courage, MD  NOVOLOG FLEXPEN 100 UNIT/ML FlexPen INJECT 12-18 UNITS S.Q. THREE TIMES DAILY WITH MEALS. Patient taking differently: Inject 12-18 Units into the skin 3 (three) times daily with meals. 04/29/16  Yes Cassandria Anger, MD  pantoprazole (PROTONIX) 40 MG tablet Take 1 tablet (40 mg total) by mouth daily. 03/12/20 10/18/21 Yes Barton Dubois, MD  pregabalin (LYRICA) 75 MG capsule Take 75 mg by mouth daily. 06/27/20  Yes [provider]  SANTYL ointment Apply 1 application topically every other day. 07/23/21  Yes [provider]  sertraline (ZOLOFT) 100 MG tablet Take 200 mg by mouth at bedtime.    Yes [provider]  spironolactone (ALDACTONE) 25 MG tablet Take 12.5 mg by mouth daily. 10/15/21  Yes [provider]  ticagrelor ([name]) 90 MG TABS tablet Take 1 tablet (90 mg total) by mouth 2 (two) times daily. 05/04/19  Yes Emokpae, Courage, MD  acetaminophen (TYLENOL) 500 MG tablet Take 500 mg by mouth every 4 (four) hours as needed. Patient not taking: Reported on 10/18/2021    [provider]  brimonidine (ALPHAGAN) 0.2 % ophthalmic solution Place 1 drop into the right eye 2 (two) times daily. Patient not taking: Reported on 10/18/2021 07/12/20   Bernarda Caffey, MD  clindamycin (CLEOCIN) 150 MG capsule Take 2 capsules (300 mg total) by mouth 3 (three) times daily. May dispense as 150mg  capsules Patient not  taking: Reported on 10/18/2021 02/12/21   Margarita Mail, PA-C  dorzolamide-timolol (COSOPT) 22.3-6.8 MG/ML ophthalmic solution Place 1 drop into the right eye 2 (two) times daily. Patient not taking: Reported on 10/18/2021 07/12/20   Bernarda Caffey, MD  ondansetron (ZOFRAN ODT) 4 MG disintegrating tablet Take 1 tablet (4 mg total) by mouth every 8 (eight) hours as needed. 4mg  ODT q4 hours prn nausea/vomit Patient not taking: Reported on 10/18/2021 07/10/21   Mesner, Corene Cornea, MD  ondansetron (ZOFRAN) 4 MG tablet Take 1 tablet (4 mg total) by mouth every 8 (eight) hours as needed for nausea or vomiting. Patient not taking: Reported on 10/18/2021 07/10/21   Mesner, Corene Cornea, MD  oxyCODONE (ROXICODONE) 5 MG immediate release tablet Take 0.5-1 tablets (2.5-5 mg total) by mouth every 6 (six) hours as needed for severe pain. Patient not taking: Reported on 10/18/2021 02/12/21   Margarita Mail, PA-C  promethazine (PHENERGAN) 12.5 MG suppository Place 1 suppository (12.5 mg total) rectally as needed for refractory nausea / vomiting. Patient not taking: Reported on 10/18/2021 03/12/20   Barton Dubois, MD  Vitamin D, Ergocalciferol, (DRISDOL) 1.25 MG (50000 UNIT) CAPS capsule Take 50,000 Units by mouth every 7 (seven) days. Patient not taking: Reported on 10/18/2021 06/27/20   [provider]    Inpatient Medications: Scheduled Meds:  (feeding supplement) PROSource Plus  30 mL Oral BID BM   vitamin C  500 mg Oral BID   aspirin EC  81 mg Oral Q breakfast   atorvastatin  80 mg Oral QPM   carvedilol  3.125 mg Oral BID   ezetimibe  10 mg Oral Daily   fenofibrate  54 mg Oral Daily   heparin  5,000 Units Subcutaneous Q8H   insulin aspart  0-15 Units Subcutaneous TID WC   insulin aspart  0-5 Units Subcutaneous QHS   insulin aspart  15 Units Subcutaneous TID WC   insulin detemir  40 Units Subcutaneous QHS   isosorbide mononitrate  30 mg Oral Daily   losartan  25 mg Oral QPM   metroNIDAZOLE  500 mg Oral  Q12H   multivitamin with minerals  1 tablet Oral Daily   nutrition supplement (JUVEN)  1 packet Oral BID BM   pregabalin  75 mg Oral Daily   Ensure Max Protein  11 oz Oral BID   sertraline  200 mg Oral QHS   zinc sulfate  220 mg Oral Daily   Continuous Infusions:  ceFEPime (MAXIPIME) IV Stopped (10/20/21 0051)   promethazine (PHENERGAN) injection (IM or IVPB) 12.5 mg (10/20/21 0052)   vancomycin Stopped (10/19/21 2326)   PRN Meds: acetaminophen **OR** acetaminophen, albuterol, morphine injection, oxyCODONE, promethazine (PHENERGAN) injection (IM or IVPB)  Allergies:    Allergies  Allergen Reactions   Canagliflozin Other (See Comments)    Vaginal yeast infections   Nsaids Other (See Comments)    Yeast infection     Social History:   Social History   Socioeconomic History   Marital status: Married    Spouse name: Not on file   Number of children: 2   Years of education: Not on file   Highest education level: Not on file  Occupational History   Not on file  Tobacco Use   Smoking status: Former    Packs/day: 0.25    Years: 20.00    Pack years: 5.00    Types: Cigarettes    Quit date: 12/29/2013    Years since quitting: 7.8   Smokeless tobacco: Never  Vaping Use   Vaping Use: Never used  Substance and Sexual Activity   Alcohol use: No   Drug use: No   Sexual activity: Yes    Birth control/protection: Surgical  Other Topics Concern   Not on file  Social History Narrative   Not on file   Social Determinants of Health   Financial Resource Strain: Not on file  Food Insecurity: Not on file  Transportation Needs: Not on file  Physical Activity: Not on file  Stress: Not on file  Social Connections: Not on file  Intimate Partner Violence: Not on file    Family History:     Family History  Problem Relation Age of Onset   Diabetes Father    Lung cancer Father    Alcoholism Father    Asthma Mother    Kidney disease Mother    Anemia Mother        hemolytic    Hypertension Mother    Heart attack Paternal Grandmother    Diabetes Paternal Grandmother    Diabetes Paternal Grandfather    Anesthesia problems Neg Hx    Hypotension Neg Hx    Malignant hyperthermia Neg Hx    Pseudochol deficiency Neg Hx      ROS:  Please see the history of present illness.  Review of Systems  Constitutional: Positive for malaise/fatigue and weight loss.  HENT: Negative.    Eyes: Negative.   Cardiovascular:  Positive for dyspnea on exertion.  Respiratory: Negative.    Hematologic/Lymphatic: Negative.   Musculoskeletal:  Positive for joint pain and muscle weakness.  Gastrointestinal:  Positive for abdominal pain.  Genitourinary: Negative.   Neurological:  Positive for dizziness and weakness.   All other ROS reviewed and negative.     Physical Exam/Data:   Vitals:   10/19/21 1429 10/19/21 2037 10/20/21 0455 10/20/21 0745  BP: 108/67 106/70 (!) 150/87 133/83  Pulse: 93 95 89 87  Resp: 17 20 18 17   Temp: 98.9 F (37.2 C) 99 F (37.2 C) 98.9 F (37.2 C) 97.9 F (36.6 C)  TempSrc: Oral Oral  Oral  SpO2: 98% 100% 98%   Weight:      Height:        Intake/Output Summary (Last 24 hours) at 10/20/2021 0900 Last data filed at 10/20/2021 0536 Gross  per 24 hour  Intake 2476.41 ml  Output 2100 ml  Net 376.41 ml   Last 3 Weights 10/18/2021 10/17/2021 07/09/2021  Weight (lbs) 167 lb 8.8 oz 168 lb 205 lb 0.4 oz  Weight (kg) 76 kg 76.204 kg 93 kg     Body mass index is 24.74 kg/m.  General:  Well nourished, well developed, in no acute distress  HEENT: normal Neck: no JVD Vascular: No carotid bruits; Distal pulses 2+ bilaterally Cardiac:  normal S1, S2; RRR; no murmur   Lungs:  clear to auscultation bilaterally, no wheezing, rhonchi or rales  Abd: soft, nontender, no hepatomegaly  Ext: no edema Musculoskeletal:  No deformities, BUE and BLE strength normal and equal Skin: warm and dry  Neuro:  CNs 2-12 intact, no focal abnormalities noted Psych:   Normal affect   EKG:  The EKG was personally reviewed and demonstrates:  NSR Telemetry:  Telemetry was personally reviewed and demonstrates:  NSR  Relevant CV Studies: Echo 10/18/21 IMPRESSIONS     1. Hypokinesis of the mid-apical anterolateral, apical inferolatera,  apical anterior, and apical myocardium. Left ventricular ejection  fraction, by estimation, is 45 to 50%. The left ventricle has mildly  decreased function. The left ventricle  demonstrates regional wall motion abnormalities (see scoring  diagram/findings for description). Left ventricular diastolic parameters  are consistent with Grade II diastolic dysfunction (pseudonormalization).   2. Right ventricular systolic function is normal. The right ventricular  size is normal.   3. The mitral valve is normal in structure. No evidence of mitral valve  regurgitation. No evidence of mitral stenosis.   4. The aortic valve is tricuspid. Aortic valve regurgitation is not  visualized. No aortic stenosis is present.   5. The inferior vena cava is normal in size with greater than 50%  respiratory variability, suggesting right atrial pressure of 3 mmHg.   Laboratory Data:  High Sensitivity Troponin:  No results for input(s): TROPONINIHS in the last 720 hours.   Chemistry Recent Labs  Lab 10/18/21 0201 10/19/21 0416 10/20/21 0344  NA 129* 133* 133*  K 3.1* 3.6 4.6  CL 92* 101 105  CO2 25 23 21*  GLUCOSE 410* 134* 84  BUN 14 14 14   CREATININE 1.09* 1.01* 0.99  CALCIUM 7.9* 8.1* 8.4*  MG 1.4* 1.9  --   GFRNONAA >60 >60 >60  ANIONGAP 12 9 7     Recent Labs  Lab 10/17/21 1848 10/18/21 0201  PROT 9.0* 7.1  ALBUMIN 3.3* 2.7*  AST 13* 13*  ALT 12 11  ALKPHOS 77 65  BILITOT 1.0 0.5   Lipids No results for input(s): CHOL, TRIG, HDL, LABVLDL, LDLCALC, CHOLHDL in the last 168 hours.  Hematology Recent Labs  Lab 10/18/21 0201 10/19/21 0416 10/20/21 0344  WBC 13.5* 13.6* 10.9*  RBC 3.03* 2.78* 2.62*  HGB 7.9* 7.2*  6.8*  HCT 24.2* 23.0* 21.7*  MCV 79.9* 82.7 82.8  MCH 26.1 25.9* 26.0  MCHC 32.6 31.3 31.3  RDW 13.6 13.8 14.4  PLT 362 359 350   Thyroid No results for input(s): TSH, FREET4 in the last 168 hours.  BNPNo results for input(s): BNP, PROBNP in the last 168 hours.  DDimer No results for input(s): DDIMER in the last 168 hours.   Radiology/Studies:  DG Ankle Complete Left  Result Date: 10/17/2021 CLINICAL DATA:  Initial evaluation for acute redness, pain. History of osteomyelitis. EXAM: LEFT ANKLE COMPLETE - 3+ VIEW COMPARISON:  None. FINDINGS: No acute fracture or dislocation. Ankle mortise approximated.  No erosive changes to suggest acute osteomyelitis about the ankle. No visible soft tissue abnormality. Few scattered vascular calcifications noted. Age-indeterminate erosive changes about the fifth metatarsal head, partially visualized, and better evaluated on concomitant radiograph of foot. IMPRESSION: 1. No acute osseous abnormality about the left ankle. No radiographic evidence for acute osteomyelitis. 2. Age-indeterminate erosive changes about the fifth metatarsal head, better evaluated on concomitant radiograph of the foot. Electronically Signed   By: Jeannine Boga M.D.   On: 10/17/2021 23:50   US ARTERIAL ABI (SCREENING LOWER EXTREMITY)  Result Date: 10/20/2021 CLINICAL DATA:  Left lower extremity redness, foot and ankle swelling, and osteomyelitis. Prior right toe amputation. EXAM: NONINVASIVE PHYSIOLOGIC VASCULAR STUDY OF BILATERAL LOWER EXTREMITIES TECHNIQUE: Evaluation of both lower extremities were performed at rest, including calculation of ankle-brachial indices with single level Doppler, pressure and pulse volume recording. COMPARISON:  None. FINDINGS: Right ABI:  1.14 Left ABI:  0.78 Right Lower Extremity: Monophasic waveforms seen at posterior tibial and dorsalis pedis arteries. Left Lower Extremity: Monophasic waveform seen at posterior tibial and dorsalis pedis arteries.  0.5-0.79 Moderate PAD IMPRESSION: 1. Moderate PVD of the left lower extremity. 2. Although ABI values in the right lower extremity are within normal limits, findings are suspicious for significant lower extremity arterial occlusive disease given monophasic waveforms. Electronically Signed   By: Miachel Roux M.D.   On: 10/20/2021 07:39   DG Foot Complete Left  Result Date: 10/17/2021 CLINICAL DATA:  Initial evaluation for redness, swelling. History of osteomyelitis. EXAM: LEFT FOOT - COMPLETE 3+ VIEW COMPARISON:  Prior radiograph from 07/01/2018. FINDINGS: There has been progressive osseous erosion about the fifth metatarsal head since previous exam. Progressive erosion and/or postsurgical changes seen about the left fifth proximal, middle, and distal phalanges as well. Finding of uncertain acuity, and could reflect progressive but chronic changes of osteomyelitis. There is additional osseous erosion centered about the left second PIP joint, concerning for osteomyelitis. Erosive changes with lucency extends to involve the adjacent second middle and proximal phalanges. Overlying soft tissue swelling. No visible soft tissue emphysema. Possible superimposed oblique fracture extending through the base of the left second proximal phalanx with intra-articular extension noted. No other acute osseous abnormality about the foot. 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 Small posterior calcaneal enthesophyte. No other soft tissue abnormality. Scattered vascular calcifications noted. IMPRESSION: 1. Osseous erosive changes centered about the left second PIP joint, concerning for acute osteomyelitis. 2. Possible superimposed oblique fracture extending through the base of the left second proximal phalanx with intra-articular extension. 3. Progressive osseous erosion about the left fifth metatarsal head as well as the fifth proximal, middle, and distal phalanges. Finding is of uncertain acuity, and could reflect  progressive but chronic changes of osteomyelitis. Electronically Signed   By: Jeannine Boga M.D.   On: 10/17/2021 23:48   ECHOCARDIOGRAM COMPLETE  Result Date: 10/18/2021    ECHOCARDIOGRAM REPORT   Patient Name:   Anne [name] Date of Exam: 10/18/2021 Medical Rec #:  [id number]        Height:       69.0 in Accession #:    [id number]       Weight:       167.5 lb Date of Birth:  [birth year]        BSA:          1.916 m Patient Age:    40 years         BP:  108/64 mmHg Patient Gender: F                HR:           98 bpm. Exam Location:  [facility] Procedure: 2D Echo, Cardiac Doppler and Color Doppler Indications:    Congestive Heart Failure I50.9  History:        Patient has prior history of Echocardiogram examinations, most                 recent 07/08/2020. CHF, CAD and Previous Myocardial Infarction;                 Risk Factors:Hypertension, Diabetes and Dyslipidemia. S/P PTCA                 (percutaneous transluminal coronary angioplasty).  Sonographer:    Alvino Chapel RCS Referring Phys: [id number] [place]  1. Hypokinesis of the mid-apical anterolateral, apical inferolatera, apical anterior, and apical myocardium. Left ventricular ejection fraction, by estimation, is 45 to 50%. The left ventricle has mildly decreased function. The left ventricle demonstrates regional wall motion abnormalities (see scoring diagram/findings for description). Left ventricular diastolic parameters are consistent with Grade II diastolic dysfunction (pseudonormalization).  2. Right ventricular systolic function is normal. The right ventricular size is normal.  3. The mitral valve is normal in structure. No evidence of mitral valve regurgitation. No evidence of mitral stenosis.  4. The aortic valve is tricuspid. Aortic valve regurgitation is not visualized. No aortic stenosis is present.  5. The inferior vena cava is normal in size with greater than 50% respiratory variability, suggesting right  atrial pressure of 3 mmHg. FINDINGS  Left Ventricle: Hypokinesis of the mid-apical anterolateral, apical inferolatera, apical anterior, and apical myocardium. Left ventricular ejection fraction, by estimation, is 45 to 50%. The left ventricle has mildly decreased function. The left ventricle demonstrates regional wall motion abnormalities. The left ventricular internal cavity size was normal in size. There is no left ventricular hypertrophy. Left ventricular diastolic parameters are consistent with Grade II diastolic dysfunction (pseudonormalization). Right Ventricle: The right ventricular size is normal. No increase in right ventricular wall thickness. Right ventricular systolic function is normal. Left Atrium: Left atrial size was normal in size. Right Atrium: Right atrial size was normal in size. Pericardium: There is no evidence of pericardial effusion. Mitral Valve: The mitral valve is normal in structure. No evidence of mitral valve regurgitation. No evidence of mitral valve stenosis. Tricuspid Valve: The tricuspid valve is normal in structure. Tricuspid valve regurgitation is not demonstrated. No evidence of tricuspid stenosis. Aortic Valve: The aortic valve is tricuspid. Aortic valve regurgitation is not visualized. No aortic stenosis is present. Pulmonic Valve: The pulmonic valve was normal in structure. Pulmonic valve regurgitation is not visualized. No evidence of pulmonic stenosis. Aorta: The aortic root is normal in size and structure. Venous: The inferior vena cava is normal in size with greater than 50% respiratory variability, suggesting right atrial pressure of 3 mmHg. IAS/Shunts: No atrial level shunt detected by color flow Doppler.  LEFT VENTRICLE PLAX 2D LVIDd:         4.70 cm   Diastology LVIDs:         3.10 cm   LV e' medial:    6.53 cm/s LV PW:         1.00 cm   LV E/e' medial:  17.5 LV IVS:        0.90 cm   LV e' lateral:   7.62 cm/s  LVOT diam:     2.00 cm   LV E/e' lateral: 15.0 LV SV:          61 LV SV Index:   32 LVOT Area:     3.14 cm  RIGHT VENTRICLE RV S prime:     13.20 cm/s TAPSE (M-mode): 1.9 cm LEFT ATRIUM             Index        RIGHT ATRIUM           Index LA diam:        4.20 cm 2.19 cm/m   RA Area:     14.70 cm LA Vol (A2C):   41.2 ml 21.50 ml/m  RA Volume:   34.80 ml  18.16 ml/m LA Vol (A4C):   46.5 ml 24.27 ml/m LA Biplane Vol: 48.1 ml 25.10 ml/m  AORTIC VALVE LVOT Vmax:   108.00 cm/s LVOT Vmean:  68.500 cm/s LVOT VTI:    0.193 m  AORTA Ao Root diam: 3.50 cm MITRAL VALVE MV Area (PHT): 4.06 cm     SHUNTS MV Decel Time: 187 msec     Systemic VTI:  0.19 m MV E velocity: 114.00 cm/s  Systemic Diam: 2.00 cm MV A velocity: 101.00 cm/s MV E/A ratio:  1.13 Skeet Latch MD Electronically signed by Skeet Latch MD Signature Date/Time: 10/18/2021/4:19:07 PM    Final      Assessment and Plan:   Preoperative clearance for cellulitis with ischemic left foot/Osteomyolitis scheduled for surgery tomorrow by Dr. Arnoldo Morale but may be on hold with anemia. No chest pain and unable to assess METS as she's in a wheelchair. No ischemic work up since [place] in 2019 but echo 10/18/21 improved LVEF 45-50%. Now with Hbg 6.8. definitely higher risk for surgery but no chest pain in setting of severe anemia, some DOE.   CAD Ant STEMI 2019 POBA LAD and 100% OM2.  ICM EF 25-35%-now improved 45-50% on spironolactone, coreg, losartan-hasn't tolerated entresto in past   HTN-runs low  Anemia-Hbg 6.8   HLD LDL 30- 05/2020  DM A1C 12   Risk Assessment/Risk Scores:                For questions or updates, please contact [facility] Please consult [email or web address] for contact info under    Signed, Ermalinda Barrios, PA-C  10/20/2021 9:00 AM  Patient seen and examined and agree with Ermalinda Barrios, PA-C as detailed above.  In brief, the patient is a 40 year old female with history of known CAD with anterior MI in 2019 s/p POBA to LAD, ischemic cardiomyopathy with EF 25-30% that  improved to 45-50% on recent TTE, poorly controlled DMII, gastroparesis, HTN, HLD and PAD who presented with a left foot diabetic foot ulcer found to have osteomyelitis, necrosis and spreading cellulitis now planned for distal tibial/BKA. Cardiology is consulted for pre-operative evaluation.  Currently, the patient is minimally mobile due to left foot osteomyelitis but denies any chest pain/indigestion symptoms that were her anginal equivalent in the past. Repeat TTE shows improvement of her LVEF to 45-50% with mid-apical anterolateral, apical inferolateral, apical interior and apex hypokinesis. Has been compliant with cardiac medications.   Given improvement in EF, lack of anginal symptoms and active infection, no ischemic work-up needed at this time as would not change overall management in this case. Overall, she is high risk for surgery due to underlying comorbidities. Will continue medical optimization prior to OR.  GEN: No acute distress.  Neck: No JVD Cardiac: RRR, no murmurs, rubs, or gallops.  Respiratory: Clear to auscultation bilaterally. GI: Soft, nontender, non-distended  MS: Right TMA; left food with multiple draining ulcers with surrounding cellulitis extending to the ankle.  Neuro:  Nonfocal  Psych: Normal affect    Plan: -No ischemic testing at this time prior to OR as would not change management given improvement of EF and active infection -Appears euvolemic and compensated from HF standpoint -Continue ASA 81mg  daily -Continue lipitor 80mg  daily -[name] on hold prior to OR; resume as able once cleared from surgical perspective -Continue coreg 3.125mg  BID -Continue imdur 30mg  daily -Continue losartan 25mg  daily -Management of osteo per primary and surgery -Will arrange for CV follow-up as out-patient  Gwyndolyn Kaufman, MD

## 2021-10-20 NOTE — Progress Notes (Signed)
Pt tolerating blood product infusion without s/s reaction. Currently sitting up in bed eating lunch. Advised to call for any SOB, CP or other issues. States understanding.

## 2021-10-20 NOTE — Progress Notes (Signed)
Subjective: Patient denies any left foot pain.  Objective: Vital signs in last 24 hours: Temp:  [97.9 F (36.6 C)-99 F (37.2 C)] 97.9 F (36.6 C) (11/28 0745) Pulse Rate:  [87-95] 87 (11/28 0745) Resp:  [17-20] 17 (11/28 0745) BP: (106-150)/(67-87) 133/83 (11/28 0745) SpO2:  [98 %-100 %] 98 % (11/28 0455) Last BM Date: 10/19/21  Intake/Output from previous day: 11/27 0701 - 11/28 0700 In: 2996.4 [P.O.:2080; IV Piggyback:916.4] Out: 2500 [Urine:2500] Intake/Output this shift: Total I/O In: 240 [P.O.:240] Out: -   General appearance: alert, cooperative, and no distress Extremities: Left foot edema and erythema slightly improved.  Left second toe is multiple fine.  She still has erythema and some serous drainage from skin breakdown along the medial aspect of the mid right foot.  This is probably secondary to subcutaneous tissue necrosis.  Lab Results:  Recent Labs    10/19/21 0416 10/20/21 0344  WBC 13.6* 10.9*  HGB 7.2* 6.8*  HCT 23.0* 21.7*  PLT 359 350   BMET Recent Labs    10/19/21 0416 10/20/21 0344  NA 133* 133*  K 3.6 4.6  CL 101 105  CO2 23 21*  GLUCOSE 134* 84  BUN 14 14  CREATININE 1.01* 0.99  CALCIUM 8.1* 8.4*   PT/INR No results for input(s): LABPROT, INR in the last 72 hours.  Studies/Results: US ARTERIAL ABI (SCREENING LOWER EXTREMITY)  Result Date: 10/20/2021 CLINICAL DATA:  Left lower extremity redness, foot and ankle swelling, and osteomyelitis. Prior right toe amputation. EXAM: NONINVASIVE PHYSIOLOGIC VASCULAR STUDY OF BILATERAL LOWER EXTREMITIES TECHNIQUE: Evaluation of both lower extremities were performed at rest, including calculation of ankle-brachial indices with single level Doppler, pressure and pulse volume recording. COMPARISON:  None. FINDINGS: Right ABI:  1.14 Left ABI:  0.78 Right Lower Extremity: Monophasic waveforms seen at posterior tibial and dorsalis pedis arteries. Left Lower Extremity: Monophasic waveform seen at  posterior tibial and dorsalis pedis arteries. 0.5-0.79 Moderate PAD IMPRESSION: 1. Moderate PVD of the left lower extremity. 2. Although ABI values in the right lower extremity are within normal limits, findings are suspicious for significant lower extremity arterial occlusive disease given monophasic waveforms. Electronically Signed   By: Miachel Roux M.D.   On: 10/20/2021 07:39   ECHOCARDIOGRAM COMPLETE  Result Date: 10/18/2021    ECHOCARDIOGRAM REPORT   Patient Name:   Anne [name] Date of Exam: 10/18/2021 Medical Rec #:  [id number]        Height:       69.0 in Accession #:    [id number]       Weight:       167.5 lb Date of Birth:  [birth year]        BSA:          1.916 m Patient Age:    40 years         BP:           108/64 mmHg Patient Gender: F                HR:           98 bpm. Exam Location:  [facility] Procedure: 2D Echo, Cardiac Doppler and Color Doppler Indications:    Congestive Heart Failure I50.9  History:        Patient has prior history of Echocardiogram examinations, most                 recent 07/08/2020. CHF, CAD and Previous Myocardial Infarction;  Risk Factors:Hypertension, Diabetes and Dyslipidemia. S/P PTCA                 (percutaneous transluminal coronary angioplasty).  Sonographer:    Alvino Chapel RCS Referring Phys: [id number] [place]  1. Hypokinesis of the mid-apical anterolateral, apical inferolatera, apical anterior, and apical myocardium. Left ventricular ejection fraction, by estimation, is 45 to 50%. The left ventricle has mildly decreased function. The left ventricle demonstrates regional wall motion abnormalities (see scoring diagram/findings for description). Left ventricular diastolic parameters are consistent with Grade II diastolic dysfunction (pseudonormalization).  2. Right ventricular systolic function is normal. The right ventricular size is normal.  3. The mitral valve is normal in structure. No evidence of mitral valve  regurgitation. No evidence of mitral stenosis.  4. The aortic valve is tricuspid. Aortic valve regurgitation is not visualized. No aortic stenosis is present.  5. The inferior vena cava is normal in size with greater than 50% respiratory variability, suggesting right atrial pressure of 3 mmHg. FINDINGS  Left Ventricle: Hypokinesis of the mid-apical anterolateral, apical inferolatera, apical anterior, and apical myocardium. Left ventricular ejection fraction, by estimation, is 45 to 50%. The left ventricle has mildly decreased function. The left ventricle demonstrates regional wall motion abnormalities. The left ventricular internal cavity size was normal in size. There is no left ventricular hypertrophy. Left ventricular diastolic parameters are consistent with Grade II diastolic dysfunction (pseudonormalization). Right Ventricle: The right ventricular size is normal. No increase in right ventricular wall thickness. Right ventricular systolic function is normal. Left Atrium: Left atrial size was normal in size. Right Atrium: Right atrial size was normal in size. Pericardium: There is no evidence of pericardial effusion. Mitral Valve: The mitral valve is normal in structure. No evidence of mitral valve regurgitation. No evidence of mitral valve stenosis. Tricuspid Valve: The tricuspid valve is normal in structure. Tricuspid valve regurgitation is not demonstrated. No evidence of tricuspid stenosis. Aortic Valve: The aortic valve is tricuspid. Aortic valve regurgitation is not visualized. No aortic stenosis is present. Pulmonic Valve: The pulmonic valve was normal in structure. Pulmonic valve regurgitation is not visualized. No evidence of pulmonic stenosis. Aorta: The aortic root is normal in size and structure. Venous: The inferior vena cava is normal in size with greater than 50% respiratory variability, suggesting right atrial pressure of 3 mmHg. IAS/Shunts: No atrial level shunt detected by color flow Doppler.   LEFT VENTRICLE PLAX 2D LVIDd:         4.70 cm   Diastology LVIDs:         3.10 cm   LV e' medial:    6.53 cm/s LV PW:         1.00 cm   LV E/e' medial:  17.5 LV IVS:        0.90 cm   LV e' lateral:   7.62 cm/s LVOT diam:     2.00 cm   LV E/e' lateral: 15.0 LV SV:         61 LV SV Index:   32 LVOT Area:     3.14 cm  RIGHT VENTRICLE RV S prime:     13.20 cm/s TAPSE (M-mode): 1.9 cm LEFT ATRIUM             Index        RIGHT ATRIUM           Index LA diam:        4.20 cm 2.19 cm/m   RA Area:  14.70 cm LA Vol (A2C):   41.2 ml 21.50 ml/m  RA Volume:   34.80 ml  18.16 ml/m LA Vol (A4C):   46.5 ml 24.27 ml/m LA Biplane Vol: 48.1 ml 25.10 ml/m  AORTIC VALVE LVOT Vmax:   108.00 cm/s LVOT Vmean:  68.500 cm/s LVOT VTI:    0.193 m  AORTA Ao Root diam: 3.50 cm MITRAL VALVE MV Area (PHT): 4.06 cm     SHUNTS MV Decel Time: 187 msec     Systemic VTI:  0.19 m MV E velocity: 114.00 cm/s  Systemic Diam: 2.00 cm MV A velocity: 101.00 cm/s MV E/A ratio:  1.13 Skeet Latch MD Electronically signed by Skeet Latch MD Signature Date/Time: 10/18/2021/4:19:07 PM    Final     Anti-infectives: Anti-infectives (From admission, onward)    Start     Dose/Rate Route Frequency Ordered Stop   10/18/21 0015  metroNIDAZOLE (FLAGYL) tablet 500 mg        500 mg Oral Every 12 hours 10/18/21 0003 10/24/21 2159   10/18/21 0015  vancomycin (VANCOREADY) IVPB 1500 mg/300 mL        1,500 mg 150 mL/hr over 120 Minutes Intravenous Daily at bedtime 10/18/21 0010 10/24/21 2159   10/18/21 0015  ceFEPIme (MAXIPIME) 2 g in sodium chloride 0.9 % 100 mL IVPB        2 g 200 mL/hr over 30 Minutes Intravenous Every 8 hours 10/18/21 0010 10/25/21 0014       Assessment/Plan: Impression: Cellulitis with osteomyelitis of the left foot.  The second toe is multiple finding.  Her left foot is overall improved, though in the long-term, she may end up needing a left below-knee amputation.  She is somewhat resistant to getting this done during  this admission.  Will discuss with her tomorrow whether or not to treat with antibiotics versus proceeding with surgery.  Appreciate cardiology input.  Anemia most likely secondary to chronic disease.  She is receiving a unit of packed red blood cells.  LOS: 2 days    Aviva Signs 10/20/2021

## 2021-10-20 NOTE — Progress Notes (Signed)
PROGRESS NOTE    Anne [name]  LPF:[id number] DOB: 03-02-1981 DOA: 10/17/2021 PCP: Alfonse Flavors, MD    Brief Narrative:  40 year old with history of type 2 diabetes on insulin, peripheral vascular disease, hyperlipidemia, essential hypertension, previous transmetatarsal amputation right foot, chronic combined heart failure presented with 1 month of pain and swelling drainage and discharge from the left foot not relieved by outpatient therapies.  Consistently elevated blood sugars.  Also with persistent nausea vomiting.   Assessment & Plan:   Principal Problem:   Osteomyelitis ([place]) Active Problems:   Type 2 diabetes mellitus with vascular disease ([name])   Hyperlipidemia associated with type 2 diabetes mellitus ([name])   Essential hypertension   Intractable nausea and vomiting   Osteomyelitis of second toe of left foot (H[facility])  Diabetic foot ulcer/spreading cellulitis and necrosis with osteomyelitis of the foot: Nonviable foot.  Currently stabilizing on broad-spectrum antibiotics with vancomycin cefepime and Flagyl.  Blood cultures negative so far.  X-ray consistent with digital osteomyelitis.  ABI with reasonable good blood flow. Surgery following, anticipate distal tibial/below-knee amputation.  Adequate pain medications.  Type 2 diabetes on insulin, diabetic gastroparesis and diabetic foot ulcer: Peripheral neuropathy Poorly controlled.  Last known A1c 12. Blood sugars acceptable today.  We will continue to work on good blood sugar control perioperative. Patient is on Lyrica that is continued. Symptomatic treatment for gastroparesis.    History of coronary artery disease/ischemic cardiomyopathy: Patient on dual antiplatelet therapy with aspirin and [name], last dose of [name] on 11/25.  Holding [name] but continuing aspirin.  Currently without chest pain. She is also on nitrates, statin, beta-blockers and ARB that is continued. 2D echocardiogram shows mostly  chronic changes. Cardiology evaluation today.  Hypokalemia: Replaced with improvement.  Hypophosphatemia: Replaced with improvement.  Anemia: Mostly anemia of chronic disease.  She has microcytic anemia with no evidence of active bleeding.  Combination of hemodilution, anemia of chronic disease. Iron studies.  Folic acid and Z32 levels.  Stool occult blood. 1 unit of PRBC transfusion today, patient consented.   DVT prophylaxis: heparin injection 5,000 Units Start: 10/18/21 0600 SCDs Start: 10/18/21 0110   Code Status: Full code Family Communication: None Disposition Plan: Status is: Inpatient  Remains inpatient appropriate because: Significant foot infection and osteomyelitis needing IV antibiotics and inpatient surgical procedures.         Consultants:  General surgery  Procedures:  None  Antimicrobials:  Vancomycin, cefepime and Flagyl 11/25---   Subjective:  Patient seen and examined.  Pain and nausea is well controlled today.  No other overnight events. Hemoglobin gradually drifted down to 6.8 along with hydration.  WC count also improving and trending down.  Objective: Vitals:   10/19/21 1429 10/19/21 2037 10/20/21 0455 10/20/21 0745  BP: 108/67 106/70 (!) 150/87 133/83  Pulse: 93 95 89 87  Resp: 17 20 18 17   Temp: 98.9 F (37.2 C) 99 F (37.2 C) 98.9 F (37.2 C) 97.9 F (36.6 C)  TempSrc: Oral Oral  Oral  SpO2: 98% 100% 98%   Weight:      Height:        Intake/Output Summary (Last 24 hours) at 10/20/2021 0927 Last data filed at 10/20/2021 0913 Gross per 24 hour  Intake 2716.41 ml  Output 2100 ml  Net 616.41 ml   Filed Weights   10/17/21 1841 10/18/21 0117  Weight: 76.2 kg 76 kg    Examination:  General exam: Appears calm and comfortable  Chronically sick looking.  Pale.  Not  in any distress.  Frail and debilitated. Respiratory system: Clear to auscultation. Respiratory effort normal. Cardiovascular system: S1 & S2 heard, RRR.   Gastrointestinal system: Abdomen is nondistended, soft and nontender. No organomegaly or masses felt. Normal bowel sounds heard. Central nervous system: Alert and oriented. No focal neurological deficits. Extremities: Symmetric 5 x 5 power. Skin:  Right foot with transmetatarsal amputation.  Small callus on the plantar aspect with no active infection. Left foot with multiple draining ulcers, redness and inflammation and wet gangrene of the distal phalanxes.  Erythema extending up to the ankle.    Data Reviewed: I have personally reviewed following labs and imaging studies  CBC: Recent Labs  Lab 10/17/21 1848 10/18/21 0201 10/19/21 0416 10/20/21 0344  WBC 17.6* 13.5* 13.6* 10.9*  NEUTROABS  --  11.6* 11.7* 8.9*  HGB 10.0* 7.9* 7.2* 6.8*  HCT 30.8* 24.2* 23.0* 21.7*  MCV 80.4 79.9* 82.7 82.8  PLT 446* 362 359 742   Basic Metabolic Panel: Recent Labs  Lab 10/17/21 1848 10/18/21 0201 10/19/21 0416 10/20/21 0344  NA 128* 129* 133* 133*  K 2.8* 3.1* 3.6 4.6  CL 86* 92* 101 105  CO2 25 25 23  21*  GLUCOSE 380* 410* 134* 84  BUN 17 14 14 14   CREATININE 1.25* 1.09* 1.01* 0.99  CALCIUM 8.9 7.9* 8.1* 8.4*  MG  --  1.4* 1.9  --   PHOS  --   --  1.5* 2.4*   GFR: Estimated Creatinine Clearance: 78.2 mL/min (by C-G formula based on SCr of 0.99 mg/dL). Liver Function Tests: Recent Labs  Lab 10/17/21 1848 10/18/21 0201  AST 13* 13*  ALT 12 11  ALKPHOS 77 65  BILITOT 1.0 0.5  PROT 9.0* 7.1  ALBUMIN 3.3* 2.7*   Recent Labs  Lab 10/17/21 1848  LIPASE 19   No results for input(s): AMMONIA in the last 168 hours. Coagulation Profile: No results for input(s): INR, PROTIME in the last 168 hours. Cardiac Enzymes: No results for input(s): CKTOTAL, CKMB, CKMBINDEX, TROPONINI in the last 168 hours. BNP (last 3 results) No results for input(s): PROBNP in the last 8760 hours. HbA1C: Recent Labs    10/17/21 1848 10/18/21 0201  HGBA1C 12.7* 12.9*   CBG: Recent Labs  Lab  10/19/21 1115 10/19/21 1606 10/19/21 2045 10/20/21 0452 10/20/21 0732  GLUCAP 249* 176* 188* 74 103*   Lipid Profile: No results for input(s): CHOL, HDL, LDLCALC, TRIG, CHOLHDL, LDLDIRECT in the last 72 hours. Thyroid Function Tests: No results for input(s): TSH, T4TOTAL, FREET4, T3FREE, THYROIDAB in the last 72 hours. Anemia Panel: No results for input(s): VITAMINB12, FOLATE, FERRITIN, TIBC, IRON, RETICCTPCT in the last 72 hours. Sepsis Labs: Recent Labs  Lab 10/17/21 2259 10/18/21 0200  LATICACIDVEN 1.0 1.3    Recent Results (from the past 240 hour(s))  Resp Panel by RT-PCR (Flu A&B, Covid) Nasopharyngeal Swab     Status: None   Collection Time: 10/17/21 11:10 PM   Specimen: Nasopharyngeal Swab; Nasopharyngeal(NP) swabs in vial transport medium  Result Value Ref Range Status   SARS Coronavirus 2 by RT PCR NEGATIVE NEGATIVE Final    Comment: (NOTE) SARS-CoV-2 target nucleic acids are NOT DETECTED.  The SARS-CoV-2 RNA is generally detectable in upper respiratory specimens during the acute phase of infection. The lowest concentration of SARS-CoV-2 viral copies this assay can detect is 138 copies/mL. A negative result does not preclude SARS-Cov-2 infection and should not be used as the sole basis for treatment or other patient management decisions.  A negative result may occur with  improper specimen collection/handling, submission of specimen other than nasopharyngeal swab, presence of viral mutation(s) within the areas targeted by this assay, and inadequate number of viral copies(<138 copies/mL). A negative result must be combined with clinical observations, patient history, and epidemiological information. The expected result is Negative.  Fact Sheet for Patients:  EntrepreneurPulse.com.au  Fact Sheet for Healthcare Providers:  IncredibleEmployment.be  This test is no t yet approved or cleared by the [place] FDA and  has been  authorized for detection and/or diagnosis of SARS-CoV-2 by FDA under an Emergency Use Authorization (EUA). This EUA will remain  in effect (meaning this test can be used) for the duration of the COVID-19 declaration under Section 564(b)(1) of the Act, 21 U.S.C.section 360bbb-3(b)(1), unless the authorization is terminated  or revoked sooner.       Influenza A by PCR NEGATIVE NEGATIVE Final   Influenza B by PCR NEGATIVE NEGATIVE Final    Comment: (NOTE) The Xpert Xpress SARS-CoV-2/FLU/RSV plus assay is intended as an aid in the diagnosis of influenza from Nasopharyngeal swab specimens and should not be used as a sole basis for treatment. Nasal washings and aspirates are unacceptable for Xpert Xpress SARS-CoV-2/FLU/RSV testing.  Fact Sheet for Patients: EntrepreneurPulse.com.au  Fact Sheet for Healthcare Providers: IncredibleEmployment.be  This test is not yet approved or cleared by the [place] FDA and has been authorized for detection and/or diagnosis of SARS-CoV-2 by FDA under an Emergency Use Authorization (EUA). This EUA will remain in effect (meaning this test can be used) for the duration of the COVID-19 declaration under Section 564(b)(1) of the Act, 21 U.S.C. section 360bbb-3(b)(1), unless the authorization is terminated or revoked.  Performed at [facility], [address] [place], [place] [address]          Radiology Studies: US ARTERIAL ABI (SCREENING LOWER EXTREMITY)  Result Date: 10/20/2021 CLINICAL DATA:  Left lower extremity redness, foot and ankle swelling, and osteomyelitis. Prior right toe amputation. EXAM: NONINVASIVE PHYSIOLOGIC VASCULAR STUDY OF BILATERAL LOWER EXTREMITIES TECHNIQUE: Evaluation of both lower extremities were performed at rest, including calculation of ankle-brachial indices with single level Doppler, pressure and pulse volume recording. COMPARISON:  None. FINDINGS: Right ABI:  1.14 Left ABI:   0.78 Right Lower Extremity: Monophasic waveforms seen at posterior tibial and dorsalis pedis arteries. Left Lower Extremity: Monophasic waveform seen at posterior tibial and dorsalis pedis arteries. 0.5-0.79 Moderate PAD IMPRESSION: 1. Moderate PVD of the left lower extremity. 2. Although ABI values in the right lower extremity are within normal limits, findings are suspicious for significant lower extremity arterial occlusive disease given monophasic waveforms. Electronically Signed   By: Miachel Roux M.D.   On: 10/20/2021 07:39   ECHOCARDIOGRAM COMPLETE  Result Date: 10/18/2021    ECHOCARDIOGRAM REPORT   Patient Name:   ADY HEIMANN Date of Exam: 10/18/2021 Medical Rec #:  [id number]        Height:       69.0 in Accession #:    [id number]       Weight:       167.5 lb Date of Birth:  1981-07-10        BSA:          1.916 m Patient Age:    40 years         BP:           108/64 mmHg Patient Gender: F  HR:           98 bpm. Exam Location:  [facility] Procedure: 2D Echo, Cardiac Doppler and Color Doppler Indications:    Congestive Heart Failure I50.9  History:        Patient has prior history of Echocardiogram examinations, most                 recent 07/08/2020. CHF, CAD and Previous Myocardial Infarction;                 Risk Factors:Hypertension, Diabetes and Dyslipidemia. S/P PTCA                 (percutaneous transluminal coronary angioplasty).  Sonographer:    Alvino Chapel RCS Referring Phys: [id number] [place]  1. Hypokinesis of the mid-apical anterolateral, apical inferolatera, apical anterior, and apical myocardium. Left ventricular ejection fraction, by estimation, is 45 to 50%. The left ventricle has mildly decreased function. The left ventricle demonstrates regional wall motion abnormalities (see scoring diagram/findings for description). Left ventricular diastolic parameters are consistent with Grade II diastolic dysfunction (pseudonormalization).  2. Right ventricular  systolic function is normal. The right ventricular size is normal.  3. The mitral valve is normal in structure. No evidence of mitral valve regurgitation. No evidence of mitral stenosis.  4. The aortic valve is tricuspid. Aortic valve regurgitation is not visualized. No aortic stenosis is present.  5. The inferior vena cava is normal in size with greater than 50% respiratory variability, suggesting right atrial pressure of 3 mmHg. FINDINGS  Left Ventricle: Hypokinesis of the mid-apical anterolateral, apical inferolatera, apical anterior, and apical myocardium. Left ventricular ejection fraction, by estimation, is 45 to 50%. The left ventricle has mildly decreased function. The left ventricle demonstrates regional wall motion abnormalities. The left ventricular internal cavity size was normal in size. There is no left ventricular hypertrophy. Left ventricular diastolic parameters are consistent with Grade II diastolic dysfunction (pseudonormalization). Right Ventricle: The right ventricular size is normal. No increase in right ventricular wall thickness. Right ventricular systolic function is normal. Left Atrium: Left atrial size was normal in size. Right Atrium: Right atrial size was normal in size. Pericardium: There is no evidence of pericardial effusion. Mitral Valve: The mitral valve is normal in structure. No evidence of mitral valve regurgitation. No evidence of mitral valve stenosis. Tricuspid Valve: The tricuspid valve is normal in structure. Tricuspid valve regurgitation is not demonstrated. No evidence of tricuspid stenosis. Aortic Valve: The aortic valve is tricuspid. Aortic valve regurgitation is not visualized. No aortic stenosis is present. Pulmonic Valve: The pulmonic valve was normal in structure. Pulmonic valve regurgitation is not visualized. No evidence of pulmonic stenosis. Aorta: The aortic root is normal in size and structure. Venous: The inferior vena cava is normal in size with greater than  50% respiratory variability, suggesting right atrial pressure of 3 mmHg. IAS/Shunts: No atrial level shunt detected by color flow Doppler.  LEFT VENTRICLE PLAX 2D LVIDd:         4.70 cm   Diastology LVIDs:         3.10 cm   LV e' medial:    6.53 cm/s LV PW:         1.00 cm   LV E/e' medial:  17.5 LV IVS:        0.90 cm   LV e' lateral:   7.62 cm/s LVOT diam:     2.00 cm   LV E/e' lateral: 15.0 LV SV:  61 LV SV Index:   32 LVOT Area:     3.14 cm  RIGHT VENTRICLE RV S prime:     13.20 cm/s TAPSE (M-mode): 1.9 cm LEFT ATRIUM             Index        RIGHT ATRIUM           Index LA diam:        4.20 cm 2.19 cm/m   RA Area:     14.70 cm LA Vol (A2C):   41.2 ml 21.50 ml/m  RA Volume:   34.80 ml  18.16 ml/m LA Vol (A4C):   46.5 ml 24.27 ml/m LA Biplane Vol: 48.1 ml 25.10 ml/m  AORTIC VALVE LVOT Vmax:   108.00 cm/s LVOT Vmean:  68.500 cm/s LVOT VTI:    0.193 m  AORTA Ao Root diam: 3.50 cm MITRAL VALVE MV Area (PHT): 4.06 cm     SHUNTS MV Decel Time: 187 msec     Systemic VTI:  0.19 m MV E velocity: 114.00 cm/s  Systemic Diam: 2.00 cm MV A velocity: 101.00 cm/s MV E/A ratio:  1.13 Skeet Latch MD Electronically signed by Skeet Latch MD Signature Date/Time: 10/18/2021/4:19:07 PM    Final         Scheduled Meds:  (feeding supplement) PROSource Plus  30 mL Oral BID BM   vitamin C  500 mg Oral BID   aspirin EC  81 mg Oral Q breakfast   atorvastatin  80 mg Oral QPM   carvedilol  3.125 mg Oral BID   ezetimibe  10 mg Oral Daily   fenofibrate  54 mg Oral Daily   heparin  5,000 Units Subcutaneous Q8H   insulin aspart  0-15 Units Subcutaneous TID WC   insulin aspart  0-5 Units Subcutaneous QHS   insulin aspart  15 Units Subcutaneous TID WC   insulin detemir  40 Units Subcutaneous QHS   isosorbide mononitrate  30 mg Oral Daily   losartan  25 mg Oral QPM   metroNIDAZOLE  500 mg Oral Q12H   multivitamin with minerals  1 tablet Oral Daily   nutrition supplement (JUVEN)  1 packet Oral BID BM    pregabalin  75 mg Oral Daily   Ensure Max Protein  11 oz Oral BID   sertraline  200 mg Oral QHS   zinc sulfate  220 mg Oral Daily   Continuous Infusions:  ceFEPime (MAXIPIME) IV Stopped (10/20/21 0051)   promethazine (PHENERGAN) injection (IM or IVPB) 12.5 mg (10/20/21 0052)   vancomycin Stopped (10/19/21 2326)     LOS: 2 days    Time spent: 30 minutes    Barb Merino, MD [facility] Pager [phone]

## 2021-10-20 NOTE — Discharge Instructions (Addendum)
During sick days when you are unable to eat try taking 35 units of Lantus and a lower sliding scale of Novolog. Check your sugar every 4 hours if needed and take Novolog insulin. Novolog Sliding scale: CBG 70 - 120: 0 units   CBG 121 - 150: 2 units   CBG 151 - 200: 3 units   CBG 201 - 250: 5 units   CBG 251 - 300: 8 units   CBG 301 - 350: 11 units   CBG 351 - 400: 15 units

## 2021-10-20 NOTE — Progress Notes (Signed)
HOSPITAL MEDICINE OVERNIGHT EVENT NOTE    Notified by nursing patient's hemoglobin this morning is 6.8.  This is down from 7.2 yesterday and 10 three days ago.  Nursing states there is no evidence of gross bleeding.   Patient denies shortness of breath or chest pain . Patient is hemodynamically stable.    Cause of anemia is not immediately clear.  Will order type and screen followed by 1 unit PRBC transfusion with post transfusion H/H.   Vernelle Emerald  MD [facility]

## 2021-10-20 NOTE — Progress Notes (Signed)
Inpatient Diabetes Program Recommendations  AACE/ADA: New Consensus Statement on Inpatient Glycemic Control (2015)  Target Ranges:  Prepandial:   less than 140 mg/dL      Peak postprandial:   less than 180 mg/dL (1-2 hours)      Critically ill patients:  140 - 180 mg/dL   Lab Results  Component Value Date   GLUCAP 103 (H) 10/20/2021   HGBA1C 12.9 (H) 10/18/2021    Review of Glycemic Control  Diabetes history: DM 2 type 2 Outpatient Diabetes medications: Bydureon, 2 mg QThursday, metformin 1000 mg bid, Lantus 50 units qhs, Novolog 12-18 units tid before meals Current orders for Inpatient glycemic control:  Levemir 40 units qhs Novolog 0-15 units tid + hs Novolog 15 units tid meal coverage  A1c 12.7% this admission with Hgb 10  Spoke with pt over the phone regarding A1c and glucose control at home. Pt reports she has a hard time keeping food and medication down frequently and does not feel like she should take her large doses of insulin. Pt checks her glucose twice a day. Discussed current A1c level. Discussed glucose and A1c goals. Stressed importance of glucose control on her infection and healing after this hospitalization. Discussed close follow up with PCP. Pt reports she goes to [facility]. Encouraged pt to form sick day guidelines with her PCP so that she takes a portion of her insulin at least and check glucose trends more frequently.  Pt feels low at normal, with her glucose of 74.  Pt gets her meds from [facility].  Thanks,  Tama Headings RN, MSN, BC-ADM Inpatient Diabetes Coordinator Team Pager [phone] (8a-5p)

## 2021-10-21 DIAGNOSIS — M869 Osteomyelitis, unspecified: Secondary | ICD-10-CM | POA: Diagnosis not present

## 2021-10-21 LAB — CBC WITH DIFFERENTIAL/PLATELET
Abs Immature Granulocytes: 0.08 10*3/uL — ABNORMAL HIGH (ref 0.00–0.07)
Basophils Absolute: 0.1 10*3/uL (ref 0.0–0.1)
Basophils Relative: 1 %
Eosinophils Absolute: 0.2 10*3/uL (ref 0.0–0.5)
Eosinophils Relative: 2 %
HCT: 26.5 % — ABNORMAL LOW (ref 36.0–46.0)
Hemoglobin: 7.9 g/dL — ABNORMAL LOW (ref 12.0–15.0)
Immature Granulocytes: 1 %
Lymphocytes Relative: 9 %
Lymphs Abs: 1 10*3/uL (ref 0.7–4.0)
MCH: 24.8 pg — ABNORMAL LOW (ref 26.0–34.0)
MCHC: 29.8 g/dL — ABNORMAL LOW (ref 30.0–36.0)
MCV: 83.1 fL (ref 80.0–100.0)
Monocytes Absolute: 0.7 10*3/uL (ref 0.1–1.0)
Monocytes Relative: 7 %
Neutro Abs: 8.3 10*3/uL — ABNORMAL HIGH (ref 1.7–7.7)
Neutrophils Relative %: 80 %
Platelets: 399 10*3/uL (ref 150–400)
RBC: 3.19 MIL/uL — ABNORMAL LOW (ref 3.87–5.11)
RDW: 15.7 % — ABNORMAL HIGH (ref 11.5–15.5)
WBC: 10.3 10*3/uL (ref 4.0–10.5)
nRBC: 0 % (ref 0.0–0.2)

## 2021-10-21 LAB — BASIC METABOLIC PANEL
Anion gap: 7 (ref 5–15)
BUN: 22 mg/dL — ABNORMAL HIGH (ref 6–20)
CO2: 25 mmol/L (ref 22–32)
Calcium: 8.5 mg/dL — ABNORMAL LOW (ref 8.9–10.3)
Chloride: 103 mmol/L (ref 98–111)
Creatinine, Ser: 0.93 mg/dL (ref 0.44–1.00)
GFR, Estimated: >60 mL/min (ref >60)
Glucose, Bld: 229 mg/dL — ABNORMAL HIGH (ref 70–99)
Potassium: 5 mmol/L (ref 3.5–5.1)
Sodium: 135 mmol/L (ref 135–145)

## 2021-10-21 LAB — GLUCOSE, CAPILLARY
Glucose-Capillary: 225 mg/dL — ABNORMAL HIGH (ref 70–99)
Glucose-Capillary: 188 mg/dL — ABNORMAL HIGH (ref 70–99)
Glucose-Capillary: 172 mg/dL — ABNORMAL HIGH (ref 70–99)
Glucose-Capillary: 129 mg/dL — ABNORMAL HIGH (ref 70–99)
Glucose-Capillary: 183 mg/dL — ABNORMAL HIGH (ref 70–99)

## 2021-10-21 MED ORDER — PROMETHAZINE HCL 25 MG/ML IJ SOLN
INTRAMUSCULAR | Status: AC
  Filled 2021-10-21: qty 1

## 2021-10-21 NOTE — Progress Notes (Signed)
  Subjective: Patient overall has no complaints.  Objective: Vital signs in last 24 hours: Temp:  [97.9 F (36.6 C)-98.8 F (37.1 C)] 98.7 F (37.1 C) (11/29 0900) Pulse Rate:  [91-107] 93 (11/29 0900) Resp:  [14-20] 20 (11/29 0900) BP: (92-133)/(56-84) 130/81 (11/29 0900) SpO2:  [98 %-100 %] 98 % (11/29 0900) Last BM Date: 10/20/21  Intake/Output from previous day: 11/28 0701 - 11/29 0700 In: 2445 [P.O.:1330; I.V.:50; Blood:315; IV Piggyback:750] Out: 1000 [Urine:1000] Intake/Output this shift: Total I/O In: 240 [P.O.:240] Out: 300 [Urine:300]  General appearance: alert, cooperative, and no distress Extremities: Left foot slightly improved, but still swollen and erythematous.  I did bedside debridement of the medial aspect of the midfoot with debridement of the skin and subcutaneous tissue.  This was done sharply with scissors.  An approximate 2 cm in diameter area was debrided.  No tendon was exposed.  Lab Results:  Recent Labs    10/20/21 0344 10/20/21 1445 10/21/21 0530  WBC 10.9*  --  10.3  HGB 6.8* 7.8* 7.9*  HCT 21.7* 25.6* 26.5*  PLT 350  --  399   BMET Recent Labs    10/20/21 0344 10/21/21 0530  NA 133* 135  K 4.6 5.0  CL 105 103  CO2 21* 25  GLUCOSE 84 229*  BUN 14 22*  CREATININE 0.99 0.93  CALCIUM 8.4* 8.5*   PT/INR No results for input(s): LABPROT, INR in the last 72 hours.  Studies/Results: No results found.  Anti-infectives: Anti-infectives (From admission, onward)    Start     Dose/Rate Route Frequency Ordered Stop   10/20/21 1400  vancomycin (VANCOREADY) IVPB 1000 mg/200 mL        1,000 mg 200 mL/hr over 60 Minutes Intravenous Every 12 hours 10/20/21 1100     10/18/21 0015  metroNIDAZOLE (FLAGYL) tablet 500 mg        500 mg Oral Every 12 hours 10/18/21 0003 10/24/21 2159   10/18/21 0015  vancomycin (VANCOREADY) IVPB 1500 mg/300 mL  Status:  Discontinued        1,500 mg 150 mL/hr over 120 Minutes Intravenous Daily at bedtime  10/18/21 0010 10/20/21 1100   10/18/21 0015  ceFEPIme (MAXIPIME) 2 g in sodium chloride 0.9 % 100 mL IVPB        2 g 200 mL/hr over 30 Minutes Intravenous Every 8 hours 10/18/21 0010 10/25/21 0014       Assessment/Plan: Impression: Left foot cellulitis with secondary osteomyelitis of the left second toe. Plan: Will monitor the midportion of the left foot over the next 24 to 48 hours.  Should it not improve, we will then proceed with a left below the knee amputation.  Patient is agreeable to reassess the left foot and the prognosis at that time.  LOS: 3 days    Aviva Signs 10/21/2021

## 2021-10-21 NOTE — Progress Notes (Signed)
Pharmacy Antibiotic Note  Anne [name] is a 40 y.o. female admitted on 10/17/2021 with  suspected diabetic foot infection .  Pharmacy has been consulted for Vancomycin and Cefepime dosing x 7 days. Pt also on po Flagyl. Plan is to monitor the midportion of the left foot over the next 24 to 48 hours.  Should it not improve, surgery will proceed with a left below the knee amputation. Vancomycin dose changed 11/28, will f/u with levels as indicated at [facility].  Plan: Continue Cefepime 2gm IV q8h x 7 days Continue Vancomycin 1000 mg IV Q 12 hrs x 7 days. Goal AUC 400-550. Expected AUC: 523, SCr used: 1.01 Will f/u renal function, micro data, and pt's clinical condition Vanc levels prn  Height: 5\' 9"  (175.3 cm) Weight: 76 kg (167 lb 8.8 oz) IBW/kg (Calculated) : 66.2  Temp (24hrs), Avg:98.5 F (36.9 C), Min:97.9 F (36.6 C), Max:98.8 F (37.1 C)  Recent Labs  Lab 10/17/21 1848 10/17/21 2259 10/18/21 0200 10/18/21 0201 10/19/21 0416 10/20/21 0344 10/21/21 0530  WBC 17.6*  --   --  13.5* 13.6* 10.9* 10.3  CREATININE 1.25*  --   --  1.09* 1.01* 0.99 0.93  LATICACIDVEN  --  1.0 1.3  --   --   --   --      Estimated Creatinine Clearance: 83.2 mL/min (by C-G formula based on SCr of 0.93 mg/dL).    Allergies  Allergen Reactions   Canagliflozin Other (See Comments)    Vaginal yeast infections   Nsaids Other (See Comments)    Yeast infection     Antimicrobials this admission: 11/26 Vanc >> 12/2 11/26 Cefepime >> 12/2 11/26 Flagyl >>12/2  Microbiology results: Pending  Thank you for allowing pharmacy to be a part of this patient's care.  Isac Sarna, BS Pharm D, [place] Clinical Pharmacist Pager [phone] 10/21/2021 11:12 AM

## 2021-10-21 NOTE — Progress Notes (Signed)
Wound care administered to left foot post provider debriding wound. Wet to dry dressing applied as ordered. Patient tolerated wound care.

## 2021-10-21 NOTE — Progress Notes (Signed)
PROGRESS NOTE    Anne [name]  QJJ:[id number] DOB: August 02, 1981 DOA: 10/17/2021 PCP: Alfonse Flavors, MD    Brief Narrative:  40 year old with history of type 2 diabetes on insulin, peripheral vascular disease, hyperlipidemia, essential hypertension, previous transmetatarsal amputation right foot, chronic combined heart failure presented with 1 month of pain and swelling drainage and discharge from the left foot not relieved by outpatient therapies.  Consistently elevated blood sugars.  Also with persistent nausea vomiting.   Assessment & Plan:   Principal Problem:   Osteomyelitis ([place]) Active Problems:   Type 2 diabetes mellitus with vascular disease ([place])   Hyperlipidemia associated with type 2 diabetes mellitus ([place])   Essential hypertension   Intractable nausea and vomiting   Osteomyelitis of second toe of left foot ([facility])  Diabetic foot ulcer/spreading cellulitis and necrosis with osteomyelitis of the foot: Currently stabilizing on broad-spectrum antibiotics with vancomycin cefepime and Flagyl.  Blood cultures negative so far.  X-ray consistent with digital osteomyelitis.  ABI with reasonable good blood flow. Surgery following, anticipate distal tibial/below-knee amputation.  Adequate pain medications. Patient has been hesitant for surgery but she is best stabilized today and will benefit with surgery now than in the future when she has emergencies.  She is agreeable.  Type 2 diabetes on insulin, diabetic gastroparesis and diabetic foot ulcer: Peripheral neuropathy Poorly controlled.  Last known A1c 12. Blood sugars acceptable today.  We will continue to work on good blood sugar control perioperative. Patient is on Lyrica that is continued. Symptomatic treatment for gastroparesis.    History of coronary artery disease/ischemic cardiomyopathy: Patient on dual antiplatelet therapy with aspirin and [name], last dose of [name] on 11/25.  Holding [name] but continuing  aspirin.  Currently without chest pain. She is also on nitrates, statin, beta-blockers and ARB that is continued. 2D echocardiogram shows mostly chronic changes. Seen by cardiology.  Optimized.  Hypokalemia: Replaced with improvement.  Hypophosphatemia: Replaced with improvement.  Anemia: Mostly anemia of chronic disease.  She has microcytic anemia with no evidence of active bleeding.  Combination of hemodilution, anemia of chronic disease. Iron studies.  Folic acid and G81 levels are essentially normal.  Occult blood stool pending. PRBC 1 unit given 11/28.  Improved.   DVT prophylaxis: heparin injection 5,000 Units Start: 10/18/21 0600 SCDs Start: 10/18/21 0110   Code Status: Full code Family Communication: None Disposition Plan: Status is: Inpatient  Remains inpatient appropriate because: Significant foot infection and osteomyelitis needing IV antibiotics and inpatient surgical procedures.         Consultants:  General surgery  Procedures:  None  Antimicrobials:  Vancomycin, cefepime and Flagyl 11/25---   Subjective:  Patient seen and examined.  No overnight events.  Afebrile.  Hemoglobin 7.8.  Denies any nausea or vomiting. She was wondering whether she needs surgery.  Objective: Vitals:   10/21/21 0500 10/21/21 0652 10/21/21 0900 10/21/21 1212  BP: 133/84  130/81 133/81  Pulse: 95 92 93 92  Resp: 18 18 20 19   Temp: 97.9 F (36.6 C)  98.7 F (37.1 C) 98.2 F (36.8 C)  TempSrc:   Oral Oral  SpO2: 100% 100% 98% 100%  Weight:      Height:        Intake/Output Summary (Last 24 hours) at 10/21/2021 1248 Last data filed at 10/21/2021 1212 Gross per 24 hour  Intake 2685 ml  Output 1700 ml  Net 985 ml   Filed Weights   10/17/21 1841 10/18/21 0117  Weight: 76.2 kg 76  kg    Examination:  General exam: Appears calm and comfortable  Chronically sick looking.  Pale.  Not in any distress.  Frail and debilitated. Respiratory system: Clear to  auscultation. Respiratory effort normal. Cardiovascular system: S1 & S2 heard, RRR.  Gastrointestinal system: Abdomen is nondistended, soft and nontender. No organomegaly or masses felt. Normal bowel sounds heard. Central nervous system: Alert and oriented. No focal neurological deficits. Extremities: Symmetric 5 x 5 power. Skin:  Right foot with transmetatarsal amputation.  Small callus on the plantar aspect with no active infection. Left foot with multiple areas of skin necrosis.  Redness and inflammation. wet gangrene of the distal phalanxes.  Erythema extending up to the ankle.    Data Reviewed: I have personally reviewed following labs and imaging studies  CBC: Recent Labs  Lab 10/17/21 1848 10/18/21 0201 10/19/21 0416 10/20/21 0344 10/20/21 1445 10/21/21 0530  WBC 17.6* 13.5* 13.6* 10.9*  --  10.3  NEUTROABS  --  11.6* 11.7* 8.9*  --  8.3*  HGB 10.0* 7.9* 7.2* 6.8* 7.8* 7.9*  HCT 30.8* 24.2* 23.0* 21.7* 25.6* 26.5*  MCV 80.4 79.9* 82.7 82.8  --  83.1  PLT 446* 362 359 350  --  616   Basic Metabolic Panel: Recent Labs  Lab 10/17/21 1848 10/18/21 0201 10/19/21 0416 10/20/21 0344 10/21/21 0530  NA 128* 129* 133* 133* 135  K 2.8* 3.1* 3.6 4.6 5.0  CL 86* 92* 101 105 103  CO2 25 25 23  21* 25  GLUCOSE 380* 410* 134* 84 229*  BUN 17 14 14 14  22*  CREATININE 1.25* 1.09* 1.01* 0.99 0.93  CALCIUM 8.9 7.9* 8.1* 8.4* 8.5*  MG  --  1.4* 1.9  --   --   PHOS  --   --  1.5* 2.4*  --    GFR: Estimated Creatinine Clearance: 83.2 mL/min (by C-G formula based on SCr of 0.93 mg/dL). Liver Function Tests: Recent Labs  Lab 10/17/21 1848 10/18/21 0201  AST 13* 13*  ALT 12 11  ALKPHOS 77 65  BILITOT 1.0 0.5  PROT 9.0* 7.1  ALBUMIN 3.3* 2.7*   Recent Labs  Lab 10/17/21 1848  LIPASE 19   No results for input(s): AMMONIA in the last 168 hours. Coagulation Profile: No results for input(s): INR, PROTIME in the last 168 hours. Cardiac Enzymes: No results for input(s):  CKTOTAL, CKMB, CKMBINDEX, TROPONINI in the last 168 hours. BNP (last 3 results) No results for input(s): PROBNP in the last 8760 hours. HbA1C: No results for input(s): HGBA1C in the last 72 hours.  CBG: Recent Labs  Lab 10/20/21 1137 10/20/21 2055 10/21/21 0300 10/21/21 0723 10/21/21 1110  GLUCAP 160* 86 225* 188* 172*   Lipid Profile: No results for input(s): CHOL, HDL, LDLCALC, TRIG, CHOLHDL, LDLDIRECT in the last 72 hours. Thyroid Function Tests: No results for input(s): TSH, T4TOTAL, FREET4, T3FREE, THYROIDAB in the last 72 hours. Anemia Panel: Recent Labs    10/20/21 0951  VITAMINB12 793  FOLATE 7.0  FERRITIN 186  TIBC 196*  IRON 8*   Sepsis Labs: Recent Labs  Lab 10/17/21 2259 10/18/21 0200  LATICACIDVEN 1.0 1.3    Recent Results (from the past 240 hour(s))  Resp Panel by RT-PCR (Flu A&B, Covid) Nasopharyngeal Swab     Status: None   Collection Time: 10/17/21 11:10 PM   Specimen: Nasopharyngeal Swab; Nasopharyngeal(NP) swabs in vial transport medium  Result Value Ref Range Status   SARS Coronavirus 2 by RT PCR NEGATIVE NEGATIVE Final  Comment: (NOTE) SARS-CoV-2 target nucleic acids are NOT DETECTED.  The SARS-CoV-2 RNA is generally detectable in upper respiratory specimens during the acute phase of infection. The lowest concentration of SARS-CoV-2 viral copies this assay can detect is 138 copies/mL. A negative result does not preclude SARS-Cov-2 infection and should not be used as the sole basis for treatment or other patient management decisions. A negative result may occur with  improper specimen collection/handling, submission of specimen other than nasopharyngeal swab, presence of viral mutation(s) within the areas targeted by this assay, and inadequate number of viral copies(<138 copies/mL). A negative result must be combined with clinical observations, patient history, and epidemiological information. The expected result is Negative.  Fact  Sheet for Patients:  EntrepreneurPulse.com.au  Fact Sheet for Healthcare Providers:  IncredibleEmployment.be  This test is no t yet approved or cleared by the [place] FDA and  has been authorized for detection and/or diagnosis of SARS-CoV-2 by FDA under an Emergency Use Authorization (EUA). This EUA will remain  in effect (meaning this test can be used) for the duration of the COVID-19 declaration under Section 564(b)(1) of the Act, 21 U.S.C.section 360bbb-3(b)(1), unless the authorization is terminated  or revoked sooner.       Influenza A by PCR NEGATIVE NEGATIVE Final   Influenza B by PCR NEGATIVE NEGATIVE Final    Comment: (NOTE) The Xpert Xpress SARS-CoV-2/FLU/RSV plus assay is intended as an aid in the diagnosis of influenza from Nasopharyngeal swab specimens and should not be used as a sole basis for treatment. Nasal washings and aspirates are unacceptable for Xpert Xpress SARS-CoV-2/FLU/RSV testing.  Fact Sheet for Patients: EntrepreneurPulse.com.au  Fact Sheet for Healthcare Providers: IncredibleEmployment.be  This test is not yet approved or cleared by the [place] FDA and has been authorized for detection and/or diagnosis of SARS-CoV-2 by FDA under an Emergency Use Authorization (EUA). This EUA will remain in effect (meaning this test can be used) for the duration of the COVID-19 declaration under Section 564(b)(1) of the Act, 21 U.S.C. section 360bbb-3(b)(1), unless the authorization is terminated or revoked.  Performed at [facility], [address], [place], [place] [address]          Radiology Studies: No results found.      Scheduled Meds:  (feeding supplement) PROSource Plus  30 mL Oral BID BM   vitamin C  500 mg Oral BID   aspirin EC  81 mg Oral Q breakfast   atorvastatin  80 mg Oral QPM   carvedilol  3.125 mg Oral BID   ezetimibe  10 mg Oral Daily    fenofibrate  54 mg Oral Daily   heparin  5,000 Units Subcutaneous Q8H   insulin aspart  0-15 Units Subcutaneous TID WC   insulin aspart  0-5 Units Subcutaneous QHS   insulin aspart  15 Units Subcutaneous TID WC   insulin detemir  40 Units Subcutaneous QHS   isosorbide mononitrate  30 mg Oral Daily   losartan  25 mg Oral QPM   metroNIDAZOLE  500 mg Oral Q12H   multivitamin with minerals  1 tablet Oral Daily   nutrition supplement (JUVEN)  1 packet Oral BID BM   phosphorus  500 mg Oral TID   pregabalin  75 mg Oral Daily   Ensure Max Protein  11 oz Oral BID   sertraline  200 mg Oral QHS   zinc sulfate  220 mg Oral Daily   Continuous Infusions:  ceFEPime (MAXIPIME) IV 2 g (10/21/21 0831)  promethazine (PHENERGAN) injection (IM or IVPB) 12.5 mg (10/21/21 1242)   vancomycin 1,000 mg (10/21/21 0304)     LOS: 3 days    Time spent: 30 minutes    Barb Merino, MD [facility] Pager [phone]

## 2021-10-22 DIAGNOSIS — M869 Osteomyelitis, unspecified: Secondary | ICD-10-CM

## 2021-10-22 DIAGNOSIS — I10 Essential (primary) hypertension: Secondary | ICD-10-CM | POA: Diagnosis not present

## 2021-10-22 DIAGNOSIS — E876 Hypokalemia: Secondary | ICD-10-CM

## 2021-10-22 DIAGNOSIS — E1169 Type 2 diabetes mellitus with other specified complication: Secondary | ICD-10-CM | POA: Diagnosis not present

## 2021-10-22 DIAGNOSIS — Z01810 Encounter for preprocedural cardiovascular examination: Secondary | ICD-10-CM

## 2021-10-22 DIAGNOSIS — I2510 Atherosclerotic heart disease of native coronary artery without angina pectoris: Secondary | ICD-10-CM | POA: Diagnosis not present

## 2021-10-22 LAB — GLUCOSE, CAPILLARY
Glucose-Capillary: 77 mg/dL (ref 70–99)
Glucose-Capillary: 184 mg/dL — ABNORMAL HIGH (ref 70–99)
Glucose-Capillary: 140 mg/dL — ABNORMAL HIGH (ref 70–99)
Glucose-Capillary: 143 mg/dL — ABNORMAL HIGH (ref 70–99)

## 2021-10-22 LAB — CBC WITH DIFFERENTIAL/PLATELET
Abs Immature Granulocytes: 0.1 10*3/uL — ABNORMAL HIGH (ref 0.00–0.07)
Basophils Absolute: 0 10*3/uL (ref 0.0–0.1)
Basophils Relative: 0 %
Eosinophils Absolute: 0.2 10*3/uL (ref 0.0–0.5)
Eosinophils Relative: 3 %
HCT: 24.8 % — ABNORMAL LOW (ref 36.0–46.0)
Hemoglobin: 7.3 g/dL — ABNORMAL LOW (ref 12.0–15.0)
Immature Granulocytes: 1 %
Lymphocytes Relative: 13 %
Lymphs Abs: 0.9 10*3/uL (ref 0.7–4.0)
MCH: 24.7 pg — ABNORMAL LOW (ref 26.0–34.0)
MCHC: 29.4 g/dL — ABNORMAL LOW (ref 30.0–36.0)
MCV: 83.8 fL (ref 80.0–100.0)
Monocytes Absolute: 0.6 10*3/uL (ref 0.1–1.0)
Monocytes Relative: 8 %
Neutro Abs: 5.2 10*3/uL (ref 1.7–7.7)
Neutrophils Relative %: 75 %
Platelets: 342 10*3/uL (ref 150–400)
RBC: 2.96 MIL/uL — ABNORMAL LOW (ref 3.87–5.11)
RDW: 15.6 % — ABNORMAL HIGH (ref 11.5–15.5)
WBC: 7 10*3/uL (ref 4.0–10.5)
nRBC: 0 % (ref 0.0–0.2)

## 2021-10-22 LAB — VANCOMYCIN, TROUGH: Vancomycin Tr: 19 ug/mL (ref 15–20)

## 2021-10-22 LAB — BASIC METABOLIC PANEL
Anion gap: 7 (ref 5–15)
BUN: 21 mg/dL — ABNORMAL HIGH (ref 6–20)
CO2: 23 mmol/L (ref 22–32)
Calcium: 8.4 mg/dL — ABNORMAL LOW (ref 8.9–10.3)
Chloride: 105 mmol/L (ref 98–111)
Creatinine, Ser: 0.94 mg/dL (ref 0.44–1.00)
GFR, Estimated: >60 mL/min (ref >60)
Glucose, Bld: 146 mg/dL — ABNORMAL HIGH (ref 70–99)
Potassium: 4.4 mmol/L (ref 3.5–5.1)
Sodium: 135 mmol/L (ref 135–145)

## 2021-10-22 MED ORDER — PROMETHAZINE HCL 25 MG/ML IJ SOLN
INTRAMUSCULAR | Status: AC
  Filled 2021-10-22: qty 1

## 2021-10-22 NOTE — Progress Notes (Signed)
   Progress Note  Patient Name: Anne [name] Date of Encounter: 10/22/2021  Primary Cardiologist: Dorris Carnes, MD  Reviewed cardiology consultation by Dr. Johney Frame and subsequent hospital course.  Patient with left foot cellulitis and associated osteomyelitis, currently on antibiotics and is being considered for left below-knee amputation with follow-up by Dr. Arnoldo Morale.  Has been clinically stable from a cardiac perspective.  Currently on aspirin, Coreg, Lipitor, Zetia, losartan, and Imdur.  [name] has been on hold.  Cardiac history includes angioplasty of the LAD in 2019 in the setting of late presentation anterior infarct and associated ischemic cardiomyopathy.  She has been on DAPT since that time (minimum of 12 months recommended in cardiac catheterization report).  Recent echocardiogram shows LVEF 45 to 50% range at this point.  As mentioned previously, she is at overall high risk for surgery based on comorbid illnesses, although she has been stable from a cardiac perspective without accelerating angina or active heart failure.  This is not a modifiable risk and there is no clear indication for ischemic testing at this time.  RCRI cardiac risk index is class IV, 11% chance of major adverse cardiac event.  Would continue medical therapy, holding [name].  Question whether she needs to resume [name] postoperatively based on duration of time since her prior ACS and angioplasty.  If it s resumed, would go with with lower dose at 60 mg twice daily.  Signed, Rozann Lesches, MD  10/22/2021, 9:58 AM

## 2021-10-22 NOTE — Progress Notes (Signed)
  Subjective: No acute changes.  Objective: Vital signs in last 24 hours: Temp:  [97.9 F (36.6 C)-98.7 F (37.1 C)] 97.9 F (36.6 C) (11/30 0431) Pulse Rate:  [87-95] 87 (11/30 0812) Resp:  [15-20] 15 (11/30 0431) BP: (118-133)/(73-81) 118/79 (11/30 0812) SpO2:  [96 %-100 %] 97 % (11/30 0812) Last BM Date: 10/20/21  Intake/Output from previous day: 11/29 0701 - 11/30 0700 In: 1200 [P.O.:1200] Out: 2500 [Urine:2500] Intake/Output this shift: No intake/output data recorded.  General appearance: alert, cooperative, and no distress Extremities: Left foot wound that I debrided yesterday is still looking poor.  The soft tissue is not granulating at all.  Still with mild swelling of the left forefoot.  Mild erythema is noted.  Dry gangrene is noted of the left second toe.  Lab Results:  Recent Labs    10/21/21 0530 10/22/21 0451  WBC 10.3 7.0  HGB 7.9* 7.3*  HCT 26.5* 24.8*  PLT 399 342   BMET Recent Labs    10/21/21 0530 10/22/21 0451  NA 135 135  K 5.0 4.4  CL 103 105  CO2 25 23  GLUCOSE 229* 146*  BUN 22* 21*  CREATININE 0.93 0.94  CALCIUM 8.5* 8.4*   PT/INR No results for input(s): LABPROT, INR in the last 72 hours.  Studies/Results: No results found.  Anti-infectives: Anti-infectives (From admission, onward)    Start     Dose/Rate Route Frequency Ordered Stop   10/20/21 1400  vancomycin (VANCOREADY) IVPB 1000 mg/200 mL        1,000 mg 200 mL/hr over 60 Minutes Intravenous Every 12 hours 10/20/21 1100     10/18/21 0015  metroNIDAZOLE (FLAGYL) tablet 500 mg        500 mg Oral Every 12 hours 10/18/21 0003 10/24/21 2159   10/18/21 0015  vancomycin (VANCOREADY) IVPB 1500 mg/300 mL  Status:  Discontinued        1,500 mg 150 mL/hr over 120 Minutes Intravenous Daily at bedtime 10/18/21 0010 10/20/21 1100   10/18/21 0015  ceFEPIme (MAXIPIME) 2 g in sodium chloride 0.9 % 100 mL IVPB        2 g 200 mL/hr over 30 Minutes Intravenous Every 8 hours 10/18/21  0010 10/25/21 0014       Assessment/Plan: Impression: Cellulitis and osteomyelitis involving the left foot.  Patient still has anemia that is felt to be chronic in nature. Plan: We will reassess in the morning.  I have discussed with the patient that she may end up needing a left below the knee amputation.  She is strongly considering this.  LOS: 4 days    Aviva Signs 10/22/2021

## 2021-10-22 NOTE — Progress Notes (Signed)
Pharmacy Antibiotic Note  Anne [name] is a 40 y.o. female admitted on 10/17/2021 with  suspected diabetic foot infection .  Pharmacy has been consulted for Vancomycin and Cefepime dosing x 7 days. Pt also on po Flagyl. Plan is to monitor the midportion of the left foot over the next 24 to 48 hours.  Should it not improve, surgery will proceed with a left below the knee amputation. Vancomycin Tr level= 83mcg/ml. Within range for osteomyelitis. Surgery is considering amputation 12/2  Plan: Continue Cefepime 2gm IV q8h x 7 days Continue Vancomycin 1000 mg IV Q 12 hrs x 7 days. Goal AUC 400-550. Expected AUC: 523, SCr used: 1.01 Will f/u renal function, micro data, and pt's clinical condition Vanc levels prn  Height: 5\' 9"  (175.3 cm) Weight: 76 kg (167 lb 8.8 oz) IBW/kg (Calculated) : 66.2  Temp (24hrs), Avg:98.3 F (36.8 C), Min:97.9 F (36.6 C), Max:98.7 F (37.1 C)  Recent Labs  Lab 10/17/21 2259 10/18/21 0200 10/18/21 0201 10/19/21 0416 10/20/21 0344 10/21/21 0530 10/22/21 0451 10/22/21 1232  WBC  --   --  13.5* 13.6* 10.9* 10.3 7.0  --   CREATININE  --   --  1.09* 1.01* 0.99 0.93 0.94  --   LATICACIDVEN 1.0 1.3  --   --   --   --   --   --   VANCOTROUGH  --   --   --   --   --   --   --  19     Estimated Creatinine Clearance: 82.3 mL/min (by C-G formula based on SCr of 0.94 mg/dL).    Allergies  Allergen Reactions   Canagliflozin Other (See Comments)    Vaginal yeast infections   Nsaids Other (See Comments)    Yeast infection     Antimicrobials this admission: 11/26 Vanc >> 12/2 11/26 Cefepime >> 12/2 11/26 Flagyl >>12/2  Microbiology results: Pending  Thank you for allowing pharmacy to be a part of this patient's care.  Isac Sarna, BS [place], [place] Clinical Pharmacist Pager [phone] 10/22/2021 2:13 PM

## 2021-10-22 NOTE — Progress Notes (Signed)
PROGRESS NOTE    Anne [name]  IRC:[id number] DOB: 1981-10-23 DOA: 10/17/2021 PCP: Alfonse Flavors, MD    Brief Narrative:  40 year old with history of type 2 diabetes on insulin, peripheral vascular disease, hyperlipidemia, essential hypertension, previous transmetatarsal amputation right foot, chronic combined heart failure presented with 1 month of pain and swelling drainage and discharge from the left foot not relieved by outpatient therapies.  Consistently elevated blood sugars.    Assessment & Plan:   Principal Problem:   Osteomyelitis ([place]) Active Problems:   Type 2 diabetes mellitus with vascular disease ([place])   Hyperlipidemia associated with type 2 diabetes mellitus ([place])   Essential hypertension   Intractable nausea and vomiting   Osteomyelitis of second toe of left foot (HCC)   Preoperative cardiovascular examination  Diabetic foot ulcer/spreading cellulitis and necrosis with osteomyelitis of the foot: -Currently stabilizing on broad-spectrum antibiotics with vancomycin cefepime and Flagyl.   -No fever and normal WBCs appreciated. -Blood cultures negative so far.   -X-ray consistent with digital osteomyelitis.  ABI with reasonable good blood flow. -Ongoing purulent drainage and erythematous changes in her left foot; with gangrene affecting her second toe also appreciated.  Anticipating left tibial/below-the-knee amputation. -Continue current analgesics; patient reports not having much pain.   -Patient understands the needs for surgical intervention and is willing to proceed if required. -Will follow further recommendations by general surgery.  Type 2 diabetes on insulin, diabetic gastroparesis and diabetic foot ulcer: Peripheral neuropathy Poorly controlled.   -Last known A1c 12.  -Continue to follow CBGs and further adjust hypoglycemia regimen as needed -Continue Lyrica and symptomatic treatment of her gastroparesis.  History of coronary artery  disease/ischemic cardiomyopathy: Patient on dual antiplatelet therapy with aspirin and [name], last dose of [name] on 11/25.  Holding [name] but continuing aspirin.   -Currently without chest pain. -She is also on nitrates, statin, beta-blockers and ARB that is continued. -2D echocardiogram shows mostly chronic changes. Seen by cardiology, patient is at high risk for intervention but no need for further ischemic work-up prior to surgery..    Hypokalemia:  -Repleted -Continue telemetry monitoring -Continue to follow electrolytes trend and further replete as needed.    Hypophosphatemia:  -Repleted.   -Continue to follow electrolytes intermittently.  Anemia:  -Mostly anemia of chronic disease.  She has microcytic anemia with no evidence of active bleeding.  Combination of hemodilution, anemia of chronic disease. Iron studies.  Folic acid and P10 levels are essentially normal.   -PRBC 1 unit given 11/28.  -Hemoglobin level overall improved; will continue to follow hemoglobin trend.   DVT prophylaxis: heparin injection 5,000 Units Start: 10/18/21 0600 SCDs Start: 10/18/21 0110   Code Status: Full code Family Communication: None Disposition Plan: Status is: Inpatient  Remains inpatient appropriate because: Significant foot infection and osteomyelitis needing IV antibiotics and inpatient surgical procedures.  Consultants:  General surgery Cardiology (for clearance)  Procedures:  None  Antimicrobials:  Vancomycin, cefepime and Flagyl 11/25---   Subjective: No chest pain, no nausea, no vomiting, no shortness of breath.  In no acute distress.  Understand the risk for surgical intervention, but at the same time expressed to be in agreement for definitive surgical approach to allow better chances of healing in her osteomyelitic/diabetic left foot ongoing also.  Objective: Vitals:   10/21/21 1212 10/21/21 2129 10/22/21 0431 10/22/21 0812  BP: 133/81 121/73 124/78 118/79   Pulse: 92 95 87 87  Resp: 19 20 15    Temp: 98.2 F (36.8 C) 98.7  F (37.1 C) 97.9 F (36.6 C)   TempSrc: Oral Oral    SpO2: 100% 96% 98% 97%  Weight:      Height:        Intake/Output Summary (Last 24 hours) at 10/22/2021 1311 Last data filed at 10/22/2021 1153 Gross per 24 hour  Intake 1561 ml  Output 2700 ml  Net -1139 ml   Filed Weights   10/17/21 1841 10/18/21 0117  Weight: 76.2 kg 76 kg    Examination: General exam: Alert, awake, oriented x 3, no acute distress.  Chronically ill in appearance, pale reporting no chest pain, fever or shortness of breath. Respiratory system: Clear to auscultation. Respiratory effort normal. Cardiovascular system:RRR. No murmurs, rubs, gallops.  No JVD. Gastrointestinal system: Abdomen is nondistended, soft and nontender. No organomegaly or masses felt. Normal bowel sounds heard. Central nervous system: Alert and oriented. No focal neurological deficits. Extremities/skin: No cyanosis or clubbing; right foot with transmetatarsal amputation.  Small callus on the plantar aspect with no active infection once again appreciated; left foot with multiple areas of skin necrosis, erythematous changes and blister aspect in her dorsal area.  Medially actively draining wound with ongoing purulence.  Also wet gangrene changes appreciated in her second toe.  Please refer to media pictures taken for further details. Psychiatry: Judgement and insight appear normal.  Flat affect appreciated.  Data Reviewed: I have personally reviewed following labs and imaging studies  CBC: Recent Labs  Lab 10/18/21 0201 10/19/21 0416 10/20/21 0344 10/20/21 1445 10/21/21 0530 10/22/21 0451  WBC 13.5* 13.6* 10.9*  --  10.3 7.0  NEUTROABS 11.6* 11.7* 8.9*  --  8.3* 5.2  HGB 7.9* 7.2* 6.8* 7.8* 7.9* 7.3*  HCT 24.2* 23.0* 21.7* 25.6* 26.5* 24.8*  MCV 79.9* 82.7 82.8  --  83.1 83.8  PLT 362 359 350  --  399 277   Basic Metabolic Panel: Recent Labs  Lab 10/18/21 0201  10/19/21 0416 10/20/21 0344 10/21/21 0530 10/22/21 0451  NA 129* 133* 133* 135 135  K 3.1* 3.6 4.6 5.0 4.4  CL 92* 101 105 103 105  CO2 25 23 21* 25 23  GLUCOSE 410* 134* 84 229* 146*  BUN 14 14 14  22* 21*  CREATININE 1.09* 1.01* 0.99 0.93 0.94  CALCIUM 7.9* 8.1* 8.4* 8.5* 8.4*  MG 1.4* 1.9  --   --   --   PHOS  --  1.5* 2.4*  --   --    GFR: Estimated Creatinine Clearance: 82.3 mL/min (by C-G formula based on SCr of 0.94 mg/dL).  Liver Function Tests: Recent Labs  Lab 10/17/21 1848 10/18/21 0201  AST 13* 13*  ALT 12 11  ALKPHOS 77 65  BILITOT 1.0 0.5  PROT 9.0* 7.1  ALBUMIN 3.3* 2.7*   Recent Labs  Lab 10/17/21 1848  LIPASE 19   CBG: Recent Labs  Lab 10/21/21 1110 10/21/21 1623 10/21/21 2131 10/22/21 0733 10/22/21 1130  GLUCAP 172* 129* 183* 77 184*   Anemia Panel: Recent Labs    10/20/21 0951  VITAMINB12 793  FOLATE 7.0  FERRITIN 186  TIBC 196*  IRON 8*   Sepsis Labs: Recent Labs  Lab 10/17/21 2259 10/18/21 0200  LATICACIDVEN 1.0 1.3    Recent Results (from the past 240 hour(s))  Resp Panel by RT-PCR (Flu A&B, Covid) Nasopharyngeal Swab     Status: None   Collection Time: 10/17/21 11:10 PM   Specimen: Nasopharyngeal Swab; Nasopharyngeal(NP) swabs in vial transport medium  Result Value Ref Range  Status   SARS Coronavirus 2 by RT PCR NEGATIVE NEGATIVE Final    Comment: (NOTE) SARS-CoV-2 target nucleic acids are NOT DETECTED.  The SARS-CoV-2 RNA is generally detectable in upper respiratory specimens during the acute phase of infection. The lowest concentration of SARS-CoV-2 viral copies this assay can detect is 138 copies/mL. A negative result does not preclude SARS-Cov-2 infection and should not be used as the sole basis for treatment or other patient management decisions. A negative result may occur with  improper specimen collection/handling, submission of specimen other than nasopharyngeal swab, presence of viral mutation(s) within  the areas targeted by this assay, and inadequate number of viral copies(<138 copies/mL). A negative result must be combined with clinical observations, patient history, and epidemiological information. The expected result is Negative.  Fact Sheet for Patients:  EntrepreneurPulse.com.au  Fact Sheet for Healthcare Providers:  IncredibleEmployment.be  This test is no t yet approved or cleared by the [place] FDA and  has been authorized for detection and/or diagnosis of SARS-CoV-2 by FDA under an Emergency Use Authorization (EUA). This EUA will remain  in effect (meaning this test can be used) for the duration of the COVID-19 declaration under Section 564(b)(1) of the Act, 21 U.S.C.section 360bbb-3(b)(1), unless the authorization is terminated  or revoked sooner.       Influenza A by PCR NEGATIVE NEGATIVE Final   Influenza B by PCR NEGATIVE NEGATIVE Final    Comment: (NOTE) The Xpert Xpress SARS-CoV-2/FLU/RSV plus assay is intended as an aid in the diagnosis of influenza from Nasopharyngeal swab specimens and should not be used as a sole basis for treatment. Nasal washings and aspirates are unacceptable for Xpert Xpress SARS-CoV-2/FLU/RSV testing.  Fact Sheet for Patients: EntrepreneurPulse.com.au  Fact Sheet for Healthcare Providers: IncredibleEmployment.be  This test is not yet approved or cleared by the [place] FDA and has been authorized for detection and/or diagnosis of SARS-CoV-2 by FDA under an Emergency Use Authorization (EUA). This EUA will remain in effect (meaning this test can be used) for the duration of the COVID-19 declaration under Section 564(b)(1) of the Act, 21 U.S.C. section 360bbb-3(b)(1), unless the authorization is terminated or revoked.  Performed at [facility], [address] [place], [place] [address]      Radiology Studies: No results found.   Scheduled  Meds:  (feeding supplement) PROSource Plus  30 mL Oral BID BM   vitamin C  500 mg Oral BID   aspirin EC  81 mg Oral Q breakfast   atorvastatin  80 mg Oral QPM   carvedilol  3.125 mg Oral BID   ezetimibe  10 mg Oral Daily   fenofibrate  54 mg Oral Daily   heparin  5,000 Units Subcutaneous Q8H   insulin aspart  0-15 Units Subcutaneous TID WC   insulin aspart  0-5 Units Subcutaneous QHS   insulin aspart  15 Units Subcutaneous TID WC   insulin detemir  40 Units Subcutaneous QHS   isosorbide mononitrate  30 mg Oral Daily   losartan  25 mg Oral QPM   metroNIDAZOLE  500 mg Oral Q12H   multivitamin with minerals  1 tablet Oral Daily   nutrition supplement (JUVEN)  1 packet Oral BID BM   phosphorus  500 mg Oral TID   pregabalin  75 mg Oral Daily   Ensure Max Protein  11 oz Oral BID   sertraline  200 mg Oral QHS   zinc sulfate  220 mg Oral Daily   Continuous Infusions:  ceFEPime (MAXIPIME) IV Stopped (10/22/21 0849)   promethazine (PHENERGAN) injection (IM or IVPB) Stopped (10/22/21 0606)   vancomycin 10 mL/hr at 10/22/21 1001     LOS: 4 days    Time spent: 30 minutes    Barton Dubois, MD [facility] Pager [phone]

## 2021-10-23 DIAGNOSIS — Z01810 Encounter for preprocedural cardiovascular examination: Secondary | ICD-10-CM | POA: Diagnosis not present

## 2021-10-23 DIAGNOSIS — E1169 Type 2 diabetes mellitus with other specified complication: Secondary | ICD-10-CM | POA: Diagnosis not present

## 2021-10-23 DIAGNOSIS — M869 Osteomyelitis, unspecified: Secondary | ICD-10-CM | POA: Diagnosis not present

## 2021-10-23 DIAGNOSIS — I10 Essential (primary) hypertension: Secondary | ICD-10-CM | POA: Diagnosis not present

## 2021-10-23 LAB — CBC WITH DIFFERENTIAL/PLATELET
Abs Immature Granulocytes: 0.14 10*3/uL — ABNORMAL HIGH (ref 0.00–0.07)
Basophils Absolute: 0 10*3/uL (ref 0.0–0.1)
Basophils Relative: 0 %
Eosinophils Absolute: 0.2 10*3/uL (ref 0.0–0.5)
Eosinophils Relative: 3 %
HCT: 25.3 % — ABNORMAL LOW (ref 36.0–46.0)
Hemoglobin: 7.5 g/dL — ABNORMAL LOW (ref 12.0–15.0)
Immature Granulocytes: 2 %
Lymphocytes Relative: 17 %
Lymphs Abs: 1.2 10*3/uL (ref 0.7–4.0)
MCH: 25 pg — ABNORMAL LOW (ref 26.0–34.0)
MCHC: 29.6 g/dL — ABNORMAL LOW (ref 30.0–36.0)
MCV: 84.3 fL (ref 80.0–100.0)
Monocytes Absolute: 0.6 10*3/uL (ref 0.1–1.0)
Monocytes Relative: 8 %
Neutro Abs: 4.8 10*3/uL (ref 1.7–7.7)
Neutrophils Relative %: 70 %
Platelets: 366 10*3/uL (ref 150–400)
RBC: 3 MIL/uL — ABNORMAL LOW (ref 3.87–5.11)
RDW: 15.6 % — ABNORMAL HIGH (ref 11.5–15.5)
WBC: 7 10*3/uL (ref 4.0–10.5)
nRBC: 0 % (ref 0.0–0.2)

## 2021-10-23 LAB — GLUCOSE, CAPILLARY
Glucose-Capillary: 265 mg/dL — ABNORMAL HIGH (ref 70–99)
Glucose-Capillary: 105 mg/dL — ABNORMAL HIGH (ref 70–99)
Glucose-Capillary: 166 mg/dL — ABNORMAL HIGH (ref 70–99)
Glucose-Capillary: 188 mg/dL — ABNORMAL HIGH (ref 70–99)
Glucose-Capillary: 247 mg/dL — ABNORMAL HIGH (ref 70–99)

## 2021-10-23 LAB — PREPARE RBC (CROSSMATCH)

## 2021-10-23 MED ORDER — SODIUM CHLORIDE 0.9% IV SOLUTION: Freq: Once | INTRAVENOUS | Status: AC

## 2021-10-23 MED ORDER — CHLORHEXIDINE GLUCONATE CLOTH 2 % EX PADS
6.0000 | MEDICATED_PAD | Freq: Once | CUTANEOUS | Status: AC
  Administered 2021-10-24: 6 via TOPICAL

## 2021-10-23 MED ORDER — HYDROMORPHONE HCL 1 MG/ML IJ SOLN
1.0000 mg | INTRAMUSCULAR | Status: DC | PRN
  Administered 2021-10-23 – 2021-10-25 (×13): 1 mg via INTRAVENOUS
  Filled 2021-10-23 (×13): qty 1

## 2021-10-23 NOTE — H&P (View-Only) (Signed)
  Subjective: Patient is not having any pain relief with the morphine.  Objective: Vital signs in last 24 hours: Temp:  [97.8 F (36.6 C)-98.4 F (36.9 C)] 97.8 F (36.6 C) (12/01 0346) Pulse Rate:  [73-92] 86 (12/01 0346) Resp:  [19-20] 20 (12/01 0346) BP: (91-132)/(51-78) 132/78 (12/01 0346) SpO2:  [96 %-100 %] 99 % (12/01 0346) Last BM Date: 10/23/21  Intake/Output from previous day: 11/30 0701 - 12/01 0700 In: 1534.8 [P.O.:480; IV Piggyback:1054.8] Out: 1900 [Urine:1900] Intake/Output this shift: No intake/output data recorded.  General appearance: alert, cooperative, and no distress Extremities: Left foot wounds are not healing.  Blistering is present along the dorsal and medial aspects of the foot.  The wound is not progressing well.  Lab Results:  Recent Labs    10/22/21 0451 10/23/21 0458  WBC 7.0 7.0  HGB 7.3* 7.5*  HCT 24.8* 25.3*  PLT 342 366   BMET Recent Labs    10/21/21 0530 10/22/21 0451  NA 135 135  K 5.0 4.4  CL 103 105  CO2 25 23  GLUCOSE 229* 146*  BUN 22* 21*  CREATININE 0.93 0.94  CALCIUM 8.5* 8.4*   PT/INR No results for input(s): LABPROT, INR in the last 72 hours.  Studies/Results: No results found.  Anti-infectives: Anti-infectives (From admission, onward)    Start     Dose/Rate Route Frequency Ordered Stop   10/20/21 1400  vancomycin (VANCOREADY) IVPB 1000 mg/200 mL        1,000 mg 200 mL/hr over 60 Minutes Intravenous Every 12 hours 10/20/21 1100     10/18/21 0015  metroNIDAZOLE (FLAGYL) tablet 500 mg        500 mg Oral Every 12 hours 10/18/21 0003 10/24/21 2159   10/18/21 0015  vancomycin (VANCOREADY) IVPB 1500 mg/300 mL  Status:  Discontinued        1,500 mg 150 mL/hr over 120 Minutes Intravenous Daily at bedtime 10/18/21 0010 10/20/21 1100   10/18/21 0015  ceFEPIme (MAXIPIME) 2 g in sodium chloride 0.9 % 100 mL IVPB        2 g 200 mL/hr over 30 Minutes Intravenous Every 8 hours 10/18/21 0010 10/25/21 0014        Assessment/Plan: s/p Procedure(s): AMPUTATION BELOW KNEE Impression: Cellulitis with osteomyelitis of the left foot.  Conservative management is not working.  Patient agrees to proceed with a left below the knee amputation.  The risks and benefits of the procedure were fully explained to the patient, who gave informed consent.  We will transfuse 1 unit of packed red blood cells due to obligate surgical blood loss.  LOS: 5 days    Aviva Signs 10/23/2021

## 2021-10-23 NOTE — Progress Notes (Signed)
PROGRESS NOTE    Anne [name]  ZOX:[id number] DOB: Jan 22, 1981 DOA: 10/17/2021 PCP: Alfonse Flavors, MD    Brief Narrative:  40 year old with history of type 2 diabetes on insulin, peripheral vascular disease, hyperlipidemia, essential hypertension, previous transmetatarsal amputation right foot, chronic combined heart failure presented with 1 month of pain and swelling drainage and discharge from the left foot not relieved by outpatient therapies.  Consistently elevated blood sugars.    Assessment & Plan:   Principal Problem:   Osteomyelitis ([place]) Active Problems:   Type 2 diabetes mellitus with vascular disease ([place])   Hyperlipidemia associated with type 2 diabetes mellitus ([place])   Essential hypertension   Intractable nausea and vomiting   Osteomyelitis of second toe of left foot (HCC)   Preoperative cardiovascular examination  Diabetic foot ulcer/spreading cellulitis and necrosis with osteomyelitis of the foot: -Currently stabilizing on broad-spectrum antibiotics with vancomycin cefepime and Flagyl.   -No fever and normal WBCs appreciated. -Blood cultures negative so far.   -X-ray consistent with digital osteomyelitis.  ABI with reasonable good blood flow. -Ongoing purulent drainage and erythematous changes in her left foot; with gangrene affecting her second toe also appreciated.  Anticipating left tibial/below-the-knee amputation. -Continue current analgesics; patient reports not having much pain.   -Patient understands the needs for surgical intervention and is willing to proceed with surgery and written consent provided. -Will follow further recommendations by general surgery; but so far planning for 10-24-21 left BKA.  Type 2 diabetes on insulin, diabetic gastroparesis and diabetic foot ulcer: Peripheral neuropathy Poorly controlled.   -Last known A1c 12.  -Continue to follow CBGs and further adjust hypoglycemia regimen as needed -Continue Lyrica and symptomatic  treatment of her gastroparesis.  History of coronary artery disease/ischemic cardiomyopathy: Patient on dual antiplatelet therapy with aspirin and [name], last dose of [name] on 11/25.  Holding [name] but continuing aspirin.   -Currently without chest pain. -She is also on nitrates, statin, beta-blockers and ARB that is continued. -2D echocardiogram shows mostly chronic changes. Seen by cardiology, patient is at high risk for intervention but no need for further ischemic work-up prior to surgery..    Hypokalemia:  -Repleted -Continue telemetry monitoring -Continue to follow electrolytes trend and further replete as needed.    Hypophosphatemia:  -Repleted.   -Continue to follow electrolytes intermittently.  Anemia:  -Mostly anemia of chronic disease. Combination of hemodilution, anemia of chronic disease. Iron studies.  Folic acid and W11 levels are essentially normal.   -PRBC 1 unit given 11/28; planning to receive 1 more unit of PRBC to further stabilize hemoglobin in anticipation to surgical procedure on 10/24/2021. -No signs of overt bleeding appreciated.   DVT prophylaxis: heparin injection 5,000 Units Start: 10/18/21 0600 SCDs Start: 10/18/21 0110   Code Status: Full code Family Communication: None Disposition Plan: Status is: Inpatient  Remains inpatient appropriate because: Significant foot infection and osteomyelitis needing IV antibiotics and inpatient surgical procedures.  Consultants:  General surgery Cardiology (for clearance)  Procedures:  None  Antimicrobials:  Vancomycin, cefepime and Flagyl 11/25---   Subjective: No chest pain, no nausea, no vomiting.  Complaining of left foot pain that is not responding to IV morphine.  Patient in agreement to proceed with surgical intervention.  Objective: Vitals:   10/22/21 1435 10/22/21 2029 10/22/21 2039 10/23/21 0346  BP: 109/72 128/77 128/77 132/78  Pulse: 92 90 73 86  Resp: 20 19 19 20   Temp:  98.4  F (36.9 C) 98.4 F (36.9 C) 97.8 F (36.6  C)  TempSrc:    Oral  SpO2: 99% 100% 97% 99%  Weight:      Height:        Intake/Output Summary (Last 24 hours) at 10/23/2021 1110 Last data filed at 10/22/2021 1900 Gross per 24 hour  Intake 453.81 ml  Output 1900 ml  Net -1446.19 ml   Filed Weights   10/17/21 1841 10/18/21 0117  Weight: 76.2 kg 76 kg    Examination: General exam: Alert, awake, oriented x 3; complaining of significant pain in her left foot, currently not responding to the use of morphine.  Denies chest pain, shortness of breath, fever, amounts of vomiting. Respiratory system: Clear to auscultation. Respiratory effort normal.  Good saturation on room. Cardiovascular system:RRR. No murmurs, rubs, gallops.  No JVD. Gastrointestinal system: Abdomen is nondistended, soft and nontender. No organomegaly or masses felt. Normal bowel sounds heard. Central nervous system: Alert and oriented. No focal neurological deficits. Extremities/skin: No cyanosis; appreciated on her right lower extremity; right foot is status post transmetatarsal amputation.  Left lower extremity with unchanged purulent draining medial wound, dorsal blisters; edematous changes and wet gangrene appreciated on her 2nd toe. Please see images. Psychiatry: Judgement and insight appear normal. Flat  affect appreciated.   Data Reviewed: I have personally reviewed following labs and imaging studies  CBC: Recent Labs  Lab 10/19/21 0416 10/20/21 0344 10/20/21 1445 10/21/21 0530 10/22/21 0451 10/23/21 0458  WBC 13.6* 10.9*  --  10.3 7.0 7.0  NEUTROABS 11.7* 8.9*  --  8.3* 5.2 4.8  HGB 7.2* 6.8* 7.8* 7.9* 7.3* 7.5*  HCT 23.0* 21.7* 25.6* 26.5* 24.8* 25.3*  MCV 82.7 82.8  --  83.1 83.8 84.3  PLT 359 350  --  399 342 716   Basic Metabolic Panel: Recent Labs  Lab 10/18/21 0201 10/19/21 0416 10/20/21 0344 10/21/21 0530 10/22/21 0451  NA 129* 133* 133* 135 135  K 3.1* 3.6 4.6 5.0 4.4  CL 92* 101 105  103 105  CO2 25 23 21* 25 23  GLUCOSE 410* 134* 84 229* 146*  BUN 14 14 14  22* 21*  CREATININE 1.09* 1.01* 0.99 0.93 0.94  CALCIUM 7.9* 8.1* 8.4* 8.5* 8.4*  MG 1.4* 1.9  --   --   --   PHOS  --  1.5* 2.4*  --   --    GFR: Estimated Creatinine Clearance: 82.3 mL/min (by C-G formula based on SCr of 0.94 mg/dL).  Liver Function Tests: Recent Labs  Lab 10/17/21 1848 10/18/21 0201  AST 13* 13*  ALT 12 11  ALKPHOS 77 65  BILITOT 1.0 0.5  PROT 9.0* 7.1  ALBUMIN 3.3* 2.7*   Recent Labs  Lab 10/17/21 1848  LIPASE 19   CBG: Recent Labs  Lab 10/22/21 0733 10/22/21 1130 10/22/21 1610 10/22/21 1957 10/23/21 0725  GLUCAP 77 184* 140* 143* 105*   Sepsis Labs: Recent Labs  Lab 10/17/21 2259 10/18/21 0200  LATICACIDVEN 1.0 1.3    Recent Results (from the past 240 hour(s))  Resp Panel by RT-PCR (Flu A&B, Covid) Nasopharyngeal Swab     Status: None   Collection Time: 10/17/21 11:10 PM   Specimen: Nasopharyngeal Swab; Nasopharyngeal(NP) swabs in vial transport medium  Result Value Ref Range Status   SARS Coronavirus 2 by RT PCR NEGATIVE NEGATIVE Final    Comment: (NOTE) SARS-CoV-2 target nucleic acids are NOT DETECTED.  The SARS-CoV-2 RNA is generally detectable in upper respiratory specimens during the acute phase of infection. The lowest concentration of SARS-CoV-2  viral copies this assay can detect is 138 copies/mL. A negative result does not preclude SARS-Cov-2 infection and should not be used as the sole basis for treatment or other patient management decisions. A negative result may occur with  improper specimen collection/handling, submission of specimen other than nasopharyngeal swab, presence of viral mutation(s) within the areas targeted by this assay, and inadequate number of viral copies(<138 copies/mL). A negative result must be combined with clinical observations, patient history, and epidemiological information. The expected result is Negative.  Fact  Sheet for Patients:  EntrepreneurPulse.com.au  Fact Sheet for Healthcare Providers:  IncredibleEmployment.be  This test is no t yet approved or cleared by the [place] FDA and  has been authorized for detection and/or diagnosis of SARS-CoV-2 by FDA under an Emergency Use Authorization (EUA). This EUA will remain  in effect (meaning this test can be used) for the duration of the COVID-19 declaration under Section 564(b)(1) of the Act, 21 U.S.C.section 360bbb-3(b)(1), unless the authorization is terminated  or revoked sooner.       Influenza A by PCR NEGATIVE NEGATIVE Final   Influenza B by PCR NEGATIVE NEGATIVE Final    Comment: (NOTE) The Xpert Xpress SARS-CoV-2/FLU/RSV plus assay is intended as an aid in the diagnosis of influenza from Nasopharyngeal swab specimens and should not be used as a sole basis for treatment. Nasal washings and aspirates are unacceptable for Xpert Xpress SARS-CoV-2/FLU/RSV testing.  Fact Sheet for Patients: EntrepreneurPulse.com.au  Fact Sheet for Healthcare Providers: IncredibleEmployment.be  This test is not yet approved or cleared by the [place] FDA and has been authorized for detection and/or diagnosis of SARS-CoV-2 by FDA under an Emergency Use Authorization (EUA). This EUA will remain in effect (meaning this test can be used) for the duration of the COVID-19 declaration under Section 564(b)(1) of the Act, 21 U.S.C. section 360bbb-3(b)(1), unless the authorization is terminated or revoked.  Performed at [facility], [address] [place], [place] [address]      Radiology Studies: No results found.   Scheduled Meds:  (feeding supplement) PROSource Plus  30 mL Oral BID BM   vitamin C  500 mg Oral BID   aspirin EC  81 mg Oral Q breakfast   atorvastatin  80 mg Oral QPM   carvedilol  3.125 mg Oral BID   ezetimibe  10 mg Oral Daily   fenofibrate  54 mg  Oral Daily   heparin  5,000 Units Subcutaneous Q8H   insulin aspart  0-15 Units Subcutaneous TID WC   insulin aspart  0-5 Units Subcutaneous QHS   insulin aspart  15 Units Subcutaneous TID WC   insulin detemir  40 Units Subcutaneous QHS   isosorbide mononitrate  30 mg Oral Daily   losartan  25 mg Oral QPM   metroNIDAZOLE  500 mg Oral Q12H   multivitamin with minerals  1 tablet Oral Daily   nutrition supplement (JUVEN)  1 packet Oral BID BM   phosphorus  500 mg Oral TID   pregabalin  75 mg Oral Daily   Ensure Max Protein  11 oz Oral BID   sertraline  200 mg Oral QHS   zinc sulfate  220 mg Oral Daily   Continuous Infusions:  ceFEPime (MAXIPIME) IV 2 g (10/23/21 0820)   promethazine (PHENERGAN) injection (IM or IVPB) Stopped (10/22/21 1729)   vancomycin 1,000 mg (10/23/21 0337)     LOS: 5 days    Time spent: 35 minutes    Barton Dubois, MD [facility] Pager [phone]

## 2021-10-23 NOTE — Progress Notes (Signed)
  Subjective: Patient is not having any pain relief with the morphine.  Objective: Vital [name] in last 24 hours: Temp:  [97.8 F (36.6 C)-98.4 F (36.9 C)] 97.8 F (36.6 C) (12/01 0346) Pulse Rate:  [73-92] 86 (12/01 0346) Resp:  [19-20] 20 (12/01 0346) BP: (91-132)/(51-78) 132/78 (12/01 0346) SpO2:  [96 %-100 %] 99 % (12/01 0346) Last BM Date: 10/23/21  Intake/Output from previous day: 11/30 0701 - 12/01 0700 In: 1534.8 [P.O.:480; IV Piggyback:1054.8] Out: 1900 [Urine:1900] Intake/Output this shift: No intake/output data recorded.  General appearance: alert, cooperative, and no distress Extremities: Left foot wounds are not healing.  Blistering is present along the dorsal and medial aspects of the foot.  The wound is not progressing well.  Lab Results:  Recent Labs    10/22/21 0451 10/23/21 0458  WBC 7.0 7.0  HGB 7.3* 7.5*  HCT 24.8* 25.3*  PLT 342 366   BMET Recent Labs    10/21/21 0530 10/22/21 0451  NA 135 135  K 5.0 4.4  CL 103 105  CO2 25 23  GLUCOSE 229* 146*  BUN 22* 21*  CREATININE 0.93 0.94  CALCIUM 8.5* 8.4*   PT/INR No results for input(s): LABPROT, INR in the last 72 hours.  Studies/Results: No results found.  Anti-infectives: Anti-infectives (From admission, onward)    Start     Dose/Rate Route Frequency Ordered Stop   10/20/21 1400  vancomycin (VANCOREADY) IVPB 1000 mg/200 mL        1,000 mg 200 mL/hr over 60 Minutes Intravenous Every 12 hours 10/20/21 1100     10/18/21 0015  metroNIDAZOLE (FLAGYL) tablet 500 mg        500 mg Oral Every 12 hours 10/18/21 0003 10/24/21 2159   10/18/21 0015  vancomycin (VANCOREADY) IVPB 1500 mg/300 mL  Status:  Discontinued        1,500 mg 150 mL/hr over 120 Minutes Intravenous Daily at bedtime 10/18/21 0010 10/20/21 1100   10/18/21 0015  ceFEPIme (MAXIPIME) 2 g in sodium chloride 0.9 % 100 mL IVPB        2 g 200 mL/hr over 30 Minutes Intravenous Every 8 hours 10/18/21 0010 10/25/21 0014        Assessment/Plan: s/p Procedure(s): AMPUTATION BELOW KNEE Impression: Cellulitis with osteomyelitis of the left foot.  Conservative management is not working.  Patient agrees to proceed with a left below the knee amputation.  The risks and benefits of the procedure were fully explained to the patient, who gave informed consent.  We will transfuse 1 unit of packed red blood cells due to obligate surgical blood loss.  LOS: 5 days    Anne [name] 10/23/2021

## 2021-10-24 ENCOUNTER — Inpatient Hospital Stay (HOSPITAL_COMMUNITY): Payer: Medicare Other | Admitting: Anesthesiology

## 2021-10-24 ENCOUNTER — Encounter (HOSPITAL_COMMUNITY)
Admission: EM | Disposition: A | Discharge status: Home Health | Payer: Self-pay | Source: Home / Self Care | Attending: Internal Medicine

## 2021-10-24 ENCOUNTER — Encounter (HOSPITAL_COMMUNITY): Payer: Self-pay | Admitting: Family Medicine

## 2021-10-24 DIAGNOSIS — M869 Osteomyelitis, unspecified: Secondary | ICD-10-CM | POA: Diagnosis not present

## 2021-10-24 HISTORY — PX: AMPUTATION: SHX166

## 2021-10-24 LAB — GLUCOSE, RANDOM: Glucose, Bld: 443 mg/dL — ABNORMAL HIGH (ref 70–99)

## 2021-10-24 LAB — TYPE AND SCREEN
ABO/RH(D): A POS
Antibody Screen: NEGATIVE
Unit division: 0
Unit division: 0

## 2021-10-24 LAB — BASIC METABOLIC PANEL
Anion gap: 7 (ref 5–15)
BUN: 23 mg/dL — ABNORMAL HIGH (ref 6–20)
CO2: 26 mmol/L (ref 22–32)
Calcium: 8.4 mg/dL — ABNORMAL LOW (ref 8.9–10.3)
Chloride: 101 mmol/L (ref 98–111)
Creatinine, Ser: 0.96 mg/dL (ref 0.44–1.00)
GFR, Estimated: >60 mL/min (ref >60)
Glucose, Bld: 180 mg/dL — ABNORMAL HIGH (ref 70–99)
Potassium: 4.4 mmol/L (ref 3.5–5.1)
Sodium: 134 mmol/L — ABNORMAL LOW (ref 135–145)

## 2021-10-24 LAB — CBC
HCT: 27.4 % — ABNORMAL LOW (ref 36.0–46.0)
Hemoglobin: 8.5 g/dL — ABNORMAL LOW (ref 12.0–15.0)
MCH: 26.3 pg (ref 26.0–34.0)
MCHC: 31 g/dL (ref 30.0–36.0)
MCV: 84.8 fL (ref 80.0–100.0)
Platelets: 345 10*3/uL (ref 150–400)
RBC: 3.23 MIL/uL — ABNORMAL LOW (ref 3.87–5.11)
RDW: 15.5 % (ref 11.5–15.5)
WBC: 7.7 10*3/uL (ref 4.0–10.5)
nRBC: 0 % (ref 0.0–0.2)

## 2021-10-24 LAB — CBC WITH DIFFERENTIAL/PLATELET
Abs Immature Granulocytes: 0.17 10*3/uL — ABNORMAL HIGH (ref 0.00–0.07)
Basophils Absolute: 0.1 10*3/uL (ref 0.0–0.1)
Basophils Relative: 1 %
Eosinophils Absolute: 0.3 10*3/uL (ref 0.0–0.5)
Eosinophils Relative: 4 %
HCT: 32.6 % — ABNORMAL LOW (ref 36.0–46.0)
Hemoglobin: 9.8 g/dL — ABNORMAL LOW (ref 12.0–15.0)
Immature Granulocytes: 2 %
Lymphocytes Relative: 24 %
Lymphs Abs: 2 10*3/uL (ref 0.7–4.0)
MCH: 25.6 pg — ABNORMAL LOW (ref 26.0–34.0)
MCHC: 30.1 g/dL (ref 30.0–36.0)
MCV: 85.1 fL (ref 80.0–100.0)
Monocytes Absolute: 0.5 10*3/uL (ref 0.1–1.0)
Monocytes Relative: 6 %
Neutro Abs: 5.3 10*3/uL (ref 1.7–7.7)
Neutrophils Relative %: 63 %
Platelets: 397 10*3/uL (ref 150–400)
RBC: 3.83 MIL/uL — ABNORMAL LOW (ref 3.87–5.11)
RDW: 15.3 % (ref 11.5–15.5)
WBC: 8.4 10*3/uL (ref 4.0–10.5)
nRBC: 0 % (ref 0.0–0.2)

## 2021-10-24 LAB — BPAM RBC
Blood Product Expiration Date: 202212092359
Blood Product Expiration Date: 202212242359
ISSUE DATE / TIME: 202211281153
ISSUE DATE / TIME: 202212011438
Unit Type and Rh: 6200
Unit Type and Rh: 6200

## 2021-10-24 LAB — GLUCOSE, CAPILLARY
Glucose-Capillary: 140 mg/dL — ABNORMAL HIGH (ref 70–99)
Glucose-Capillary: 108 mg/dL — ABNORMAL HIGH (ref 70–99)
Glucose-Capillary: 128 mg/dL — ABNORMAL HIGH (ref 70–99)
Glucose-Capillary: 80 mg/dL (ref 70–99)
Glucose-Capillary: 112 mg/dL — ABNORMAL HIGH (ref 70–99)
Glucose-Capillary: 415 mg/dL — ABNORMAL HIGH (ref 70–99)

## 2021-10-24 LAB — SURGICAL PCR SCREEN
MRSA, PCR: NEGATIVE
Staphylococcus aureus: NEGATIVE

## 2021-10-24 SURGERY — AMPUTATION BELOW KNEE
Anesthesia: General | Site: Knee | Laterality: Left

## 2021-10-24 MED ORDER — LIDOCAINE 2% (20 MG/ML) 5 ML SYRINGE
INTRAMUSCULAR | Status: DC | PRN
Start: 2021-10-24 — End: 2021-10-24
  Administered 2021-10-24: 100 mg via INTRAVENOUS

## 2021-10-24 MED ORDER — KETAMINE HCL 10 MG/ML IJ SOLN
INTRAMUSCULAR | Status: DC | PRN
Start: 2021-10-24 — End: 2021-10-24
  Administered 2021-10-24 (×4): 10 mg via INTRAVENOUS

## 2021-10-24 MED ORDER — FENTANYL CITRATE (PF) 100 MCG/2ML IJ SOLN
INTRAMUSCULAR | Status: AC
  Filled 2021-10-24: qty 2

## 2021-10-24 MED ORDER — ONDANSETRON HCL 4 MG/2ML IJ SOLN
INTRAMUSCULAR | Status: AC
  Filled 2021-10-24: qty 2

## 2021-10-24 MED ORDER — DEXTROSE 50 % IV SOLN
INTRAVENOUS | Status: AC
  Filled 2021-10-24: qty 50

## 2021-10-24 MED ORDER — DEXAMETHASONE SODIUM PHOSPHATE 10 MG/ML IJ SOLN
INTRAMUSCULAR | Status: DC | PRN
Start: 2021-10-24 — End: 2021-10-24
  Administered 2021-10-24: 5 mg via INTRAVENOUS

## 2021-10-24 MED ORDER — KETAMINE HCL 50 MG/5ML IJ SOSY
PREFILLED_SYRINGE | INTRAMUSCULAR | Status: AC
  Filled 2021-10-24: qty 5

## 2021-10-24 MED ORDER — SODIUM CHLORIDE 0.9 % IR SOLN
Status: DC | PRN
  Administered 2021-10-24: 1000 mL

## 2021-10-24 MED ORDER — PHENYLEPHRINE 40 MCG/ML (10ML) SYRINGE FOR IV PUSH (FOR BLOOD PRESSURE SUPPORT)
PREFILLED_SYRINGE | INTRAVENOUS | Status: AC
  Filled 2021-10-24: qty 10

## 2021-10-24 MED ORDER — FENTANYL CITRATE PF 50 MCG/ML IJ SOSY
25.0000 ug | PREFILLED_SYRINGE | INTRAMUSCULAR | Status: DC | PRN
  Administered 2021-10-24 (×2): 50 ug via INTRAVENOUS
  Filled 2021-10-24 (×2): qty 1

## 2021-10-24 MED ORDER — METRONIDAZOLE 500 MG PO TABS
500.0000 mg | ORAL_TABLET | Freq: Two times a day (BID) | ORAL | Status: DC
  Administered 2021-10-24 – 2021-10-25 (×2): 500 mg via ORAL
  Filled 2021-10-24 (×3): qty 1

## 2021-10-24 MED ORDER — PROMETHAZINE HCL 25 MG/ML IJ SOLN
INTRAMUSCULAR | Status: AC
  Filled 2021-10-24: qty 1

## 2021-10-24 MED ORDER — DEXAMETHASONE SODIUM PHOSPHATE 10 MG/ML IJ SOLN
INTRAMUSCULAR | Status: AC
  Filled 2021-10-24: qty 1

## 2021-10-24 MED ORDER — PHENYLEPHRINE 40 MCG/ML (10ML) SYRINGE FOR IV PUSH (FOR BLOOD PRESSURE SUPPORT)
PREFILLED_SYRINGE | INTRAVENOUS | Status: DC | PRN
  Administered 2021-10-24: 80 ug via INTRAVENOUS
  Administered 2021-10-24: 120 ug via INTRAVENOUS
  Administered 2021-10-24 (×4): 80 ug via INTRAVENOUS

## 2021-10-24 MED ORDER — HYDROMORPHONE HCL 1 MG/ML IJ SOLN
INTRAMUSCULAR | Status: DC | PRN
  Administered 2021-10-24 (×2): .5 mg via INTRAVENOUS

## 2021-10-24 MED ORDER — ONDANSETRON HCL 4 MG/2ML IJ SOLN
INTRAMUSCULAR | Status: DC | PRN
  Administered 2021-10-24: 4 mg via INTRAVENOUS

## 2021-10-24 MED ORDER — LACTATED RINGERS IV SOLN: INTRAVENOUS | Status: DC | PRN

## 2021-10-24 MED ORDER — MIDAZOLAM HCL 2 MG/2ML IJ SOLN
INTRAMUSCULAR | Status: AC
  Filled 2021-10-24: qty 2

## 2021-10-24 MED ORDER — CHLORHEXIDINE GLUCONATE CLOTH 2 % EX PADS
6.0000 | MEDICATED_PAD | Freq: Once | CUTANEOUS | Status: AC
  Administered 2021-10-24: 6 via TOPICAL

## 2021-10-24 MED ORDER — PROMETHAZINE HCL 25 MG/ML IJ SOLN
12.5000 mg | Freq: Once | INTRAMUSCULAR | Status: AC
  Administered 2021-10-24: 12.5 mg via INTRAVENOUS

## 2021-10-24 MED ORDER — MIDAZOLAM HCL 5 MG/5ML IJ SOLN
INTRAMUSCULAR | Status: DC | PRN
Start: 2021-10-24 — End: 2021-10-24
  Administered 2021-10-24: 2 mg via INTRAVENOUS

## 2021-10-24 MED ORDER — DEXTROSE 50 % IV SOLN
25.0000 mL | Freq: Once | INTRAVENOUS | Status: AC
  Administered 2021-10-24: 25 mL via INTRAVENOUS

## 2021-10-24 MED ORDER — POVIDONE-IODINE 10 % EX OINT
TOPICAL_OINTMENT | CUTANEOUS | Status: AC
  Filled 2021-10-24: qty 1

## 2021-10-24 MED ORDER — HYDROMORPHONE HCL 1 MG/ML IJ SOLN
INTRAMUSCULAR | Status: AC
  Filled 2021-10-24: qty 1

## 2021-10-24 MED ORDER — POVIDONE-IODINE 10 % OINT PACKET
TOPICAL_OINTMENT | CUTANEOUS | Status: DC | PRN
  Administered 2021-10-24: 1 via TOPICAL

## 2021-10-24 MED ORDER — ONDANSETRON HCL 4 MG/2ML IJ SOLN
4.0000 mg | Freq: Once | INTRAMUSCULAR | Status: AC | PRN
  Administered 2021-10-24: 4 mg via INTRAVENOUS
  Filled 2021-10-24: qty 2

## 2021-10-24 MED ORDER — FENTANYL CITRATE (PF) 100 MCG/2ML IJ SOLN
INTRAMUSCULAR | Status: DC | PRN
  Administered 2021-10-24 (×2): 50 ug via INTRAVENOUS

## 2021-10-24 SURGICAL SUPPLY — 35 items
BLADE SAW RECIP 87.9 MT (BLADE) ×2 IMPLANT
BLADE SURG SZ10 CARB STEEL (BLADE) ×2 IMPLANT
BNDG CMPR STD VLCR NS LF 5.8X6 (GAUZE/BANDAGES/DRESSINGS) ×2
BNDG COHESIVE 4X5 TAN STRL (GAUZE/BANDAGES/DRESSINGS) ×2 IMPLANT
BNDG ELASTIC 6X5.8 VLCR NS LF (GAUZE/BANDAGES/DRESSINGS) ×4 IMPLANT
BNDG GAUZE ELAST 4 BULKY (GAUZE/BANDAGES/DRESSINGS) ×4 IMPLANT
CLOTH BEACON ORANGE TIMEOUT ST (SAFETY) ×2 IMPLANT
COVER LIGHT HANDLE STERIS (MISCELLANEOUS) ×4 IMPLANT
ELECT REM PT RETURN 9FT ADLT (ELECTROSURGICAL) ×2
ELECTRODE REM PT RTRN 9FT ADLT (ELECTROSURGICAL) ×1 IMPLANT
GAUZE SPONGE 4X4 12PLY STRL (GAUZE/BANDAGES/DRESSINGS) ×4 IMPLANT
GLOVE SURG LTX SZ6.5 (GLOVE) ×1 IMPLANT
GLOVE SURG LTX SZ7 (GLOVE) ×1 IMPLANT
GLOVE SURG POLYISO LF SZ7.5 (GLOVE) ×3 IMPLANT
GLOVE SURG UNDER POLY LF SZ7 (GLOVE) ×4 IMPLANT
GOWN STRL REUS W/TWL LRG LVL3 (GOWN DISPOSABLE) ×6 IMPLANT
INST SET MAJOR BONE (KITS) ×2 IMPLANT
KIT TURNOVER KIT A (KITS) ×2 IMPLANT
MANIFOLD NEPTUNE II (INSTRUMENTS) ×2 IMPLANT
NS IRRIG 1000ML POUR BTL (IV SOLUTION) ×2 IMPLANT
PACK BASIC LIMB (CUSTOM PROCEDURE TRAY) ×2 IMPLANT
PAD ABD 5X9 TENDERSORB (GAUZE/BANDAGES/DRESSINGS) ×4 IMPLANT
PAD ARMBOARD 7.5X6 YLW CONV (MISCELLANEOUS) ×2 IMPLANT
SET BASIN LINEN APH (SET/KITS/TRAYS/PACK) ×2 IMPLANT
SOL PREP PROV IODINE SCRUB 4OZ (MISCELLANEOUS) ×2 IMPLANT
SPONGE T-LAP 18X18 ~~LOC~~+RFID (SPONGE) ×3 IMPLANT
STAPLER VISISTAT 35W (STAPLE) ×2 IMPLANT
SUT BONE WAX W31G (SUTURE) ×1 IMPLANT
SUT SILK 0 CT 1 30 (SUTURE) ×2 IMPLANT
SUT SILK 0 FSL (SUTURE) ×5 IMPLANT
SUT SILK 2 0 (SUTURE) ×2
SUT SILK 2 0 SH (SUTURE) ×3 IMPLANT
SUT SILK 2-0 18XBRD TIE 12 (SUTURE) ×1 IMPLANT
SUT VIC AB 2-0 CT1 27 (SUTURE) ×12
SUT VIC AB 2-0 CT1 TAPERPNT 27 (SUTURE) ×4 IMPLANT

## 2021-10-24 NOTE — Progress Notes (Signed)
At 2155,informed DR.  Adefoso about blood glucose of 415 and that stat lab drawn glucose  have been ordered per protocol, per Dr. Bryn Gulling, just give what is due from current sliding scale.  At 2313, paged Dr. Bryn Gulling about result of lab drawn glucose of 443.

## 2021-10-24 NOTE — Op Note (Signed)
Patient:  Anne [name]  DOB:  July 23, 1981  MRN:  [id number]   Preop Diagnosis: Osteomyelitis and cellulitis of left foot  Postop Diagnosis: Same  Procedure: Left below the knee amputation  Surgeon: Aviva Signs, MD  Anes: General  Indications: Patient is a 40 year old white female with multiple medical problems including diabetes mellitus and peripheral vascular disease who presents with osteomyelitis and cellulitis of the left foot.  Despite medical management, the left foot cannot be saved.  The risks and benefits of the procedure including bleeding, infection, wound breakdown, and the possible need for revision to an above-knee amputation were fully explained to the patient, who gave informed consent.  Procedure note: The patient was placed in the supine position.  After general anesthesia was administered, the left lower extremity was prepped and draped using the usual sterile technique with Betadine.  Surgical site confirmation was performed.  A transverse incision was made approximately 5 to 6 cm below the tibial tuberosity on the left side.  The muscle was divided using Bovie electrocautery.  Any major vessels were suture-ligated using an 0 silk suture.  The tibia and fibula were divided using the saw.  An amputation knife was then used to develop the posterior flap distally.  The lower extremity was then removed and sent to pathology further examination.  Bone wax was applied to the distal tibia.  The fibula was revised proximally.  I did resect some of the posterior flap muscle in order to bring the posterior flap anteriorly.  The fascial edges of the posterior and anterior skin edges were reapproximated using a 2-0 Vicryl interrupted suture.  The skin was closed using staples.  Betadine ointment and dry sterile dressing were applied.  All tape and needle counts were correct at the end of the procedure.  The patient was awakened and transferred to PACU in stable  condition.  Complications: None  EBL: 200 cc  Specimen: Left lower extremity

## 2021-10-24 NOTE — Transfer of Care (Signed)
Immediate Anesthesia Transfer of Care Note  Patient: Anne [name]  Procedure(s) Performed: AMPUTATION BELOW KNEE (Left: Knee)  Patient Location: PACU  Anesthesia Type:General  Level of Consciousness: drowsy and patient cooperative  Airway & Oxygen Therapy: Patient Spontanous Breathing and Patient connected to nasal cannula oxygen  Post-op Assessment: Report given to RN and Post -op Vital signs reviewed and stable  Post vital signs: Reviewed and stable  Last Vitals:  Vitals Value Taken Time  BP 148/97 10/24/21 1502  Temp    Pulse 100 10/24/21 1506  Resp 12 10/24/21 1506  SpO2 100 % 10/24/21 1506  Vitals shown include unvalidated device data.  Last Pain:  Vitals:   10/24/21 1250  TempSrc:   PainSc: 7       Patients Stated Pain Goal: 0 (74/82/70 7867)  Complications: No notable events documented.

## 2021-10-24 NOTE — Anesthesia Preprocedure Evaluation (Signed)
Anesthesia Evaluation  Patient identified by MRN, date of birth, ID band Patient awake    Reviewed: Allergy & Precautions, H&P , NPO status , Patient's Chart, lab work & pertinent test results, reviewed documented beta blocker date and time   Airway Mallampati: II  TM Distance: >3 FB Neck ROM: full    Dental no notable dental hx.    Pulmonary neg pulmonary ROS, former smoker,    Pulmonary exam normal breath sounds clear to auscultation       Cardiovascular Exercise Tolerance: Good hypertension, + CAD and + Past MI   Rhythm:regular Rate:Normal     Neuro/Psych PSYCHIATRIC DISORDERS Anxiety  Neuromuscular disease    GI/Hepatic Neg liver ROS, GERD  Medicated,  Endo/Other  negative endocrine ROSdiabetes, Poorly Controlled, Type 1  Renal/GU CRF and ARFRenal disease  negative genitourinary   Musculoskeletal   Abdominal   Peds  Hematology negative hematology ROS (+)   Anesthesia Other Findings   Reproductive/Obstetrics negative OB ROS                             Anesthesia Physical Anesthesia Plan  ASA: 3 and emergent  Anesthesia Plan: General and General LMA   Post-op Pain Management:    Induction:   PONV Risk Score and Plan: Ondansetron  Airway Management Planned:   Additional Equipment:   Intra-op Plan:   Post-operative Plan:   Informed Consent: I have reviewed the patients History and Physical, chart, labs and discussed the procedure including the risks, benefits and alternatives for the proposed anesthesia with the patient or authorized representative who has indicated his/her understanding and acceptance.     Dental Advisory Given  Plan Discussed with: CRNA  Anesthesia Plan Comments:         Anesthesia Quick Evaluation

## 2021-10-24 NOTE — Care Management Important Message (Signed)
Important Message  Patient Details  Name: Anne [name] MRN: [id number] Date of Birth: [birth year]   Medicare Important Message Given:  Yes     Tommy Medal 10/24/2021, 1:49 PM

## 2021-10-24 NOTE — Interval H&P Note (Signed)
History and Physical Interval Note:  10/24/2021 12:55 PM  Anne [name]  has presented today for surgery, with the diagnosis of osteomyelitis/cellulitis left foot.  The various methods of treatment have been discussed with the patient and family. After consideration of risks, benefits and other options for treatment, the patient has consented to  Procedure(s): AMPUTATION BELOW KNEE (Left) as a surgical intervention.  The patient's history has been reviewed, patient examined, no change in status, stable for surgery.  I have reviewed the patient's chart and labs.  Questions were answered to the patient's satisfaction.     Aviva Signs

## 2021-10-24 NOTE — Anesthesia Postprocedure Evaluation (Signed)
Anesthesia Post Note  Patient: Anne [name]  Procedure(s) Performed: AMPUTATION BELOW KNEE (Left: Knee)  Patient location during evaluation: Phase II Anesthesia Type: General Level of consciousness: awake Pain management: pain level controlled Vital Signs Assessment: post-procedure vital signs reviewed and stable Respiratory status: spontaneous breathing and respiratory function stable Cardiovascular status: blood pressure returned to baseline and stable Postop Assessment: no headache and no apparent nausea or vomiting Anesthetic complications: no Comments: Late entry   No notable events documented.   Last Vitals:  Vitals:   10/24/21 1502 10/24/21 1515  BP: (!) 148/97 (!) 160/99  Pulse: 100 93  Resp: (!) 26 17  Temp: 36.5 C   SpO2: 98% 100%    Last Pain:  Vitals:   10/24/21 1523  TempSrc:   PainSc: [place]

## 2021-10-24 NOTE — Plan of Care (Signed)
Pt is NPO for surgery today. Pt received prn dilaudid approx every 3 hours for pain. CHG given 0100 for hs. Preprocedure checklist started and Mrsa pcr sent.  Problem: Education: Goal: Knowledge of General Education information will improve Description: Including pain rating scale, medication(s)/side effects and non-pharmacologic comfort measures Outcome: Progressing   Problem: Health Behavior/Discharge Planning: Goal: Ability to manage health-related needs will improve Outcome: Progressing   Problem: Clinical Measurements: Goal: Ability to maintain clinical measurements within normal limits will improve Outcome: Progressing Goal: Will remain free from infection Outcome: Progressing Goal: Diagnostic test results will improve Outcome: Progressing Goal: Cardiovascular complication will be avoided Outcome: Progressing   Problem: Activity: Goal: Risk for activity intolerance will decrease Outcome: Progressing   Problem: Nutrition: Goal: Adequate nutrition will be maintained Outcome: Progressing   Problem: Coping: Goal: Level of anxiety will decrease Outcome: Progressing   Problem: Elimination: Goal: Will not experience complications related to bowel motility Outcome: Progressing Goal: Will not experience complications related to urinary retention Outcome: Progressing   Problem: Pain Managment: Goal: General experience of comfort will improve Outcome: Progressing   Problem: Safety: Goal: Ability to remain free from injury will improve Outcome: Progressing   Problem: Skin Integrity: Goal: Risk for impaired skin integrity will decrease Outcome: Progressing

## 2021-10-24 NOTE — Progress Notes (Signed)
PROGRESS NOTE    Anne [name]  OXB:[id number] DOB: 01/15/1981 DOA: 10/17/2021 PCP: Alfonse Flavors, MD   Brief Narrative:   40 year old with history of type 2 diabetes on insulin, peripheral vascular disease, hyperlipidemia, essential hypertension, previous transmetatarsal amputation right foot, chronic combined heart failure presented with 1 month of pain and swelling drainage and discharge from the left foot not relieved by outpatient therapies.  Consistently elevated blood sugars.  Plan is for left BKA today.  Assessment & Plan:   Principal Problem:   Osteomyelitis ([place]) Active Problems:   Type 2 diabetes mellitus with vascular disease ([place])   Hyperlipidemia associated with type 2 diabetes mellitus ([place])   Essential hypertension   Intractable nausea and vomiting   Osteomyelitis of second toe of left foot ([facility])   Preoperative cardiovascular examination   Diabetic foot ulcer/spreading cellulitis and necrosis with osteomyelitis of the foot: -Currently stabilizing on broad-spectrum antibiotics with vancomycin cefepime and Flagyl.   -No fever and normal WBCs appreciated. -Blood cultures negative so far.   -X-ray consistent with digital osteomyelitis.  ABI with reasonable good blood flow. -Ongoing purulent drainage and erythematous changes in her left foot; with gangrene affecting her second toe also appreciated.  Anticipating left tibial/below-the-knee amputation. -Continue current analgesics; patient reports not having much pain.   -Patient understands the needs for surgical intervention and is willing to proceed with surgery and written consent provided. -Will follow further recommendations by general surgery; but so far planning for 10-24-21 left BKA.   Type 2 diabetes on insulin, diabetic gastroparesis and diabetic foot ulcer: Peripheral neuropathy -Currently controlled -Last known A1c 12.  -Continue to follow CBGs and further adjust hypoglycemia regimen as  needed -Continue Lyrica and symptomatic treatment of her gastroparesis.   History of coronary artery disease/ischemic cardiomyopathy: Patient on dual antiplatelet therapy with aspirin and [name], last dose of [name] on 11/25.  Holding [name] but continuing aspirin.   -Currently without chest pain. -She is also on nitrates, statin, beta-blockers and ARB that is continued. -2D echocardiogram shows mostly chronic changes. Seen by cardiology, patient is at high risk for intervention but no need for further ischemic work-up prior to surgery -Plan to resume [name] at lower dose of 60 mg twice daily postoperatively   Hypophosphatemia:  -Repleted.   -Continue to follow electrolytes intermittently.   Anemia-stable -Mostly anemia of chronic disease. Combination of hemodilution, anemia of chronic disease. Iron studies.  Folic acid and A83 levels are essentially normal.   -PRBC 1 unit given 11/28; planning to receive 1 more unit of PRBC to further stabilize hemoglobin in anticipation to surgical procedure on 10/24/2021. -No signs of overt bleeding appreciated.     DVT prophylaxis: heparin injection 5,000 Units Start: 10/18/21 0600 SCDs Start: 10/18/21 0110   Code Status: Full code Family Communication: None Disposition Plan: Status is: Inpatient   Remains inpatient appropriate because: Significant foot infection and osteomyelitis needing IV antibiotics and inpatient surgical procedures.   Consultants:  General surgery Cardiology (for clearance)   Procedures:  None   Antimicrobials:  Vancomycin, cefepime and Flagyl 11/25>>  Subjective: Patient seen and evaluated today with no new acute complaints or concerns. No acute concerns or events noted overnight she is currently n.p.o. in anticipation for BKA later today.  Objective: Vitals:   10/23/21 1504 10/23/21 1745 10/23/21 2022 10/24/21 0418  BP: 123/81 129/80 (!) 133/93 133/75  Pulse: 88 91 95 84  Resp: 20 20 20 20   Temp:  98.1 F (36.7 C) 97.8 F (36.6  C) 98.6 F (37 C) 97.7 F (36.5 C)  TempSrc:  Oral    SpO2:  100% 99% 100%  Weight:      Height:        Intake/Output Summary (Last 24 hours) at 10/24/2021 1026 Last data filed at 10/24/2021 0816 Gross per 24 hour  Intake 352 ml  Output --  Net 352 ml   Filed Weights   10/17/21 1841 10/18/21 0117  Weight: 76.2 kg 76 kg    Examination:  General exam: Appears calm and comfortable  Respiratory system: Clear to auscultation. Respiratory effort normal. Cardiovascular system: S1 & S2 heard, RRR.  Gastrointestinal system: Abdomen is soft Central nervous system: Alert and awake Extremities: Right transmetatarsal amputation, left lower extremity and dressings clean dry and intact Skin: No significant lesions noted Psychiatry: Flat affect.    Data Reviewed: I have personally reviewed following labs and imaging studies  CBC: Recent Labs  Lab 10/20/21 0344 10/20/21 1445 10/21/21 0530 10/22/21 0451 10/23/21 0458 10/24/21 0033 10/24/21 0505  WBC 10.9*  --  10.3 7.0 7.0 7.7 8.4  NEUTROABS 8.9*  --  8.3* 5.2 4.8  --  5.3  HGB 6.8*   < > 7.9* 7.3* 7.5* 8.5* 9.8*  HCT 21.7*   < > 26.5* 24.8* 25.3* 27.4* 32.6*  MCV 82.8  --  83.1 83.8 84.3 84.8 85.1  PLT 350  --  399 342 366 345 397   < > = values in this interval not displayed.   Basic Metabolic Panel: Recent Labs  Lab 10/18/21 0201 10/19/21 0416 10/20/21 0344 10/21/21 0530 10/22/21 0451 10/24/21 0505  NA 129* 133* 133* 135 135 134*  K 3.1* 3.6 4.6 5.0 4.4 4.4  CL 92* 101 105 103 105 101  CO2 25 23 21* 25 23 26   GLUCOSE 410* 134* 84 229* 146* 180*  BUN 14 14 14  22* 21* 23*  CREATININE 1.09* 1.01* 0.99 0.93 0.94 0.96  CALCIUM 7.9* 8.1* 8.4* 8.5* 8.4* 8.4*  MG 1.4* 1.9  --   --   --   --   PHOS  --  1.5* 2.4*  --   --   --    GFR: Estimated Creatinine Clearance: 80.6 mL/min (by C-G formula based on SCr of 0.96 mg/dL). Liver Function Tests: Recent Labs  Lab 10/17/21 1848  10/18/21 0201  AST 13* 13*  ALT 12 11  ALKPHOS 77 65  BILITOT 1.0 0.5  PROT 9.0* 7.1  ALBUMIN 3.3* 2.7*   Recent Labs  Lab 10/17/21 1848  LIPASE 19   No results for input(s): AMMONIA in the last 168 hours. Coagulation Profile: No results for input(s): INR, PROTIME in the last 168 hours. Cardiac Enzymes: No results for input(s): CKTOTAL, CKMB, CKMBINDEX, TROPONINI in the last 168 hours. BNP (last 3 results) No results for input(s): PROBNP in the last 8760 hours. HbA1C: No results for input(s): HGBA1C in the last 72 hours. CBG: Recent Labs  Lab 10/23/21 0725 10/23/21 1148 10/23/21 1602 10/23/21 2022 10/24/21 0728  GLUCAP 105* 166* 188* 247* 140*   Lipid Profile: No results for input(s): CHOL, HDL, LDLCALC, TRIG, CHOLHDL, LDLDIRECT in the last 72 hours. Thyroid Function Tests: No results for input(s): TSH, T4TOTAL, FREET4, T3FREE, THYROIDAB in the last 72 hours. Anemia Panel: No results for input(s): VITAMINB12, FOLATE, FERRITIN, TIBC, IRON, RETICCTPCT in the last 72 hours. Sepsis Labs: Recent Labs  Lab 10/17/21 2259 10/18/21 0200  LATICACIDVEN 1.0 1.3    Recent Results (from the past 240  hour(s))  Resp Panel by RT-PCR (Flu A&B, Covid) Nasopharyngeal Swab     Status: None   Collection Time: 10/17/21 11:10 PM   Specimen: Nasopharyngeal Swab; Nasopharyngeal(NP) swabs in vial transport medium  Result Value Ref Range Status   SARS Coronavirus 2 by RT PCR NEGATIVE NEGATIVE Final    Comment: (NOTE) SARS-CoV-2 target nucleic acids are NOT DETECTED.  The SARS-CoV-2 RNA is generally detectable in upper respiratory specimens during the acute phase of infection. The lowest concentration of SARS-CoV-2 viral copies this assay can detect is 138 copies/mL. A negative result does not preclude SARS-Cov-2 infection and should not be used as the sole basis for treatment or other patient management decisions. A negative result may occur with  improper specimen  collection/handling, submission of specimen other than nasopharyngeal swab, presence of viral mutation(s) within the areas targeted by this assay, and inadequate number of viral copies(<138 copies/mL). A negative result must be combined with clinical observations, patient history, and epidemiological information. The expected result is Negative.  Fact Sheet for Patients:  EntrepreneurPulse.com.au  Fact Sheet for Healthcare Providers:  IncredibleEmployment.be  This test is no t yet approved or cleared by the [place] FDA and  has been authorized for detection and/or diagnosis of SARS-CoV-2 by FDA under an Emergency Use Authorization (EUA). This EUA will remain  in effect (meaning this test can be used) for the duration of the COVID-19 declaration under Section 564(b)(1) of the Act, 21 U.S.C.section 360bbb-3(b)(1), unless the authorization is terminated  or revoked sooner.       Influenza A by PCR NEGATIVE NEGATIVE Final   Influenza B by PCR NEGATIVE NEGATIVE Final    Comment: (NOTE) The Xpert Xpress SARS-CoV-2/FLU/RSV plus assay is intended as an aid in the diagnosis of influenza from Nasopharyngeal swab specimens and should not be used as a sole basis for treatment. Nasal washings and aspirates are unacceptable for Xpert Xpress SARS-CoV-2/FLU/RSV testing.  Fact Sheet for Patients: EntrepreneurPulse.com.au  Fact Sheet for Healthcare Providers: IncredibleEmployment.be  This test is not yet approved or cleared by the [place] FDA and has been authorized for detection and/or diagnosis of SARS-CoV-2 by FDA under an Emergency Use Authorization (EUA). This EUA will remain in effect (meaning this test can be used) for the duration of the COVID-19 declaration under Section 564(b)(1) of the Act, 21 U.S.C. section 360bbb-3(b)(1), unless the authorization is terminated or revoked.  Performed at [facility], [address] [place], [place] [address]   Surgical pcr screen     Status: None   Collection Time: 10/24/21  5:35 AM   Specimen: Nasal Mucosa; Nasal Swab  Result Value Ref Range Status   MRSA, PCR NEGATIVE NEGATIVE Final   Staphylococcus aureus NEGATIVE NEGATIVE Final    Comment: (NOTE) The Xpert SA Assay (FDA approved for NASAL specimens in patients 54 years of age and older), is one component of a comprehensive surveillance program. It is not intended to diagnose infection nor to guide or monitor treatment. Performed at [facility], [address] [place], [place] [address]          Radiology Studies: No results found.      Scheduled Meds:  (feeding supplement) PROSource Plus  30 mL Oral BID BM   vitamin C  500 mg Oral BID   aspirin EC  81 mg Oral Q breakfast   atorvastatin  80 mg Oral QPM   carvedilol  3.125 mg Oral BID   ezetimibe  10 mg Oral Daily  fenofibrate  54 mg Oral Daily   heparin  5,000 Units Subcutaneous Q8H   insulin aspart  0-15 Units Subcutaneous TID WC   insulin aspart  0-5 Units Subcutaneous QHS   insulin aspart  15 Units Subcutaneous TID WC   insulin detemir  40 Units Subcutaneous QHS   isosorbide mononitrate  30 mg Oral Daily   losartan  25 mg Oral QPM   multivitamin with minerals  1 tablet Oral Daily   nutrition supplement (JUVEN)  1 packet Oral BID BM   phosphorus  500 mg Oral TID   pregabalin  75 mg Oral Daily   Ensure Max Protein  11 oz Oral BID   sertraline  200 mg Oral QHS   zinc sulfate  220 mg Oral Daily   Continuous Infusions:  ceFEPime (MAXIPIME) IV 2 g (10/24/21 0807)   promethazine (PHENERGAN) injection (IM or IVPB) Stopped (10/22/21 1729)   vancomycin 1,000 mg (10/24/21 0159)     LOS: 6 days    Time spent: 35 minutes    Valerie Cones D Manuella Ghazi, DO [facility]  If 7PM-7AM, please contact night-coverage [email or web address] 10/24/2021, 10:26 AM

## 2021-10-24 NOTE — TOC Initial Note (Signed)
Transition of Care [facility]) - Initial/Assessment Note    Patient Details  Name: Anne [name] MRN: [id number] Date of Birth: 03/15/1981  Transition of Care (TOC) CM/SW Contact:    Joaquin Courts, RN Phone Number: 10/24/2021, 11:47 AM  Clinical Narrative:                 Patient discussed in progression rounds, MD indicates plan for surgery today for L BKA.  TOC following for possible HH/DME needs post-op.    Expected Discharge Plan: [place] Barriers to Discharge: Continued Medical Work up   Patient Goals and CMS Choice Patient states their goals for this hospitalization and ongoing recovery are:: to get better      Expected Discharge Plan and Services Expected Discharge Plan: [place]   Discharge Planning Services: CM Consult   Living arrangements for the past 2 months: Single Family Home                                      Prior Living Arrangements/Services Living arrangements for the past 2 months: Single Family Home Lives with:: Spouse Patient language and need for interpreter reviewed:: Yes Do you feel safe going back to the place where you live?: Yes      Need for Family Participation in Patient Care: No (Comment) Care giver support system in place?: Yes (comment)   Criminal Activity/Legal Involvement Pertinent to Current Situation/Hospitalization: No - Comment as needed  Activities of Daily Living Home Assistive Devices/Equipment: Environmental consultant (specify type), Bedside commode/3-in-1, Transfer board, Wheelchair ADL Screening (condition at time of admission) Patient's cognitive ability adequate to safely complete daily activities?: Yes Is the patient deaf or have difficulty hearing?: No Does the patient have difficulty seeing, even when wearing glasses/contacts?: No Does the patient have difficulty concentrating, remembering, or making decisions?: No Patient able to express need for assistance with ADLs?: Yes Does  the patient have difficulty dressing or bathing?: Yes Independently performs ADLs?: No Communication: Independent Dressing (OT): Needs assistance Is this a change from baseline?: Pre-admission baseline Grooming: Independent Feeding: Independent Bathing: Needs assistance Is this a change from baseline?: Pre-admission baseline Toileting: Needs assistance Is this a change from baseline?: Pre-admission baseline In/Out Bed: Needs assistance Is this a change from baseline?: Pre-admission baseline Walks in Home: Needs assistance Is this a change from baseline?: Pre-admission baseline Does the patient have difficulty walking or climbing stairs?: Yes Weakness of Legs: Both Weakness of Arms/Hands: None  Permission Sought/Granted                  Emotional Assessment Appearance:: Appears stated age     Orientation: : Oriented to Self, Oriented to Place, Oriented to  Time, Oriented to Situation   Psych Involvement: No (comment)  Admission diagnosis:  Hypokalemia [E87.6] Osteomyelitis ([place]) [M86.9] Osteomyelitis of second toe of left foot [facility] [M86.9] Patient Active Problem List   Diagnosis Date Noted   Preoperative cardiovascular examination    Osteomyelitis of second toe of left foot (HCC)    Intractable nausea and vomiting 03/09/2020   ACS (acute coronary syndrome) ([place]) 03/08/2020   Hypercholesteremia    Chest pain 58/52/7782   Chronic systolic CHF (congestive heart failure) ([place]) 11/28/2018   S/P PTCA (percutaneous transluminal coronary angioplasty)    CAD (coronary artery disease) 11/13/2018   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[place])  11/13/2018   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 11/11/2018   Osteomyelitis ([place]) 07/01/2018   Personal history of noncompliance with medical treatment, presenting hazards to health 10/28/2015   Multiple nevi 10/07/2012   Toe ulcer ([place]) 03/11/2012   Essential hypertension  03/11/2012   Type 2 diabetes mellitus with vascular disease ([place]) 02/11/2012   Diabetic neuropathy ([place]) 02/11/2012   Back pain 02/11/2012   Muscle spasm 02/11/2012   Hyperlipidemia associated with type 2 diabetes mellitus ([place]) 02/11/2012   Obesity 02/11/2012   Cellulitis in diabetic foot ([place]) 10/31/2011   PCP:  Alfonse Flavors, MD Pharmacy:   [facility], [place] - [address] [place] [address] Phone: [phone] Fax: [phone]     Social Determinants of Health (SDOH) Interventions    Readmission Risk Interventions Readmission Risk Prevention Plan 10/24/2021  [place] or Home Care Consult Complete  Social Work Consult for [place] Planning/Counseling [place] Not Applicable  Medication Review Press photographer) Complete  Some recent data might be hidden

## 2021-10-24 NOTE — Anesthesia Procedure Notes (Signed)
Procedure Name: LMA Insertion Date/Time: 10/24/2021 1:44 PM Performed by: Gwyndolyn Saxon, CRNA Pre-anesthesia Checklist: Patient identified, Emergency Drugs available, Suction available and Patient being monitored Patient Re-evaluated:Patient Re-evaluated prior to induction Oxygen Delivery Method: Circle system utilized Preoxygenation: Pre-oxygenation with 100% oxygen Induction Type: IV induction Ventilation: Mask ventilation without difficulty LMA: LMA inserted LMA Size: 4.0 Number of attempts: 1 Placement Confirmation: positive ETCO2 and breath sounds checked- equal and bilateral Tube secured with: Tape Dental Injury: Teeth and Oropharynx as per pre-operative assessment

## 2021-10-25 DIAGNOSIS — M869 Osteomyelitis, unspecified: Secondary | ICD-10-CM | POA: Diagnosis not present

## 2021-10-25 LAB — CBC WITH DIFFERENTIAL/PLATELET
Abs Immature Granulocytes: 0.15 10*3/uL — ABNORMAL HIGH (ref 0.00–0.07)
Basophils Absolute: 0 10*3/uL (ref 0.0–0.1)
Basophils Relative: 0 %
Eosinophils Absolute: 0.1 10*3/uL (ref 0.0–0.5)
Eosinophils Relative: 1 %
HCT: 27.7 % — ABNORMAL LOW (ref 36.0–46.0)
Hemoglobin: 8.3 g/dL — ABNORMAL LOW (ref 12.0–15.0)
Immature Granulocytes: 2 %
Lymphocytes Relative: 14 %
Lymphs Abs: 1.4 10*3/uL (ref 0.7–4.0)
MCH: 25.5 pg — ABNORMAL LOW (ref 26.0–34.0)
MCHC: 30 g/dL (ref 30.0–36.0)
MCV: 85.2 fL (ref 80.0–100.0)
Monocytes Absolute: 0.4 10*3/uL (ref 0.1–1.0)
Monocytes Relative: 4 %
Neutro Abs: 7.9 10*3/uL — ABNORMAL HIGH (ref 1.7–7.7)
Neutrophils Relative %: 79 %
Platelets: 366 10*3/uL (ref 150–400)
RBC: 3.25 MIL/uL — ABNORMAL LOW (ref 3.87–5.11)
RDW: 15.1 % (ref 11.5–15.5)
WBC: 10 10*3/uL (ref 4.0–10.5)
nRBC: 0 % (ref 0.0–0.2)

## 2021-10-25 LAB — GLUCOSE, CAPILLARY
Glucose-Capillary: 114 mg/dL — ABNORMAL HIGH (ref 70–99)
Glucose-Capillary: 148 mg/dL — ABNORMAL HIGH (ref 70–99)
Glucose-Capillary: 191 mg/dL — ABNORMAL HIGH (ref 70–99)
Glucose-Capillary: 323 mg/dL — ABNORMAL HIGH (ref 70–99)
Glucose-Capillary: 64 mg/dL — ABNORMAL LOW (ref 70–99)
Glucose-Capillary: 75 mg/dL (ref 70–99)

## 2021-10-25 LAB — BASIC METABOLIC PANEL
Anion gap: 7 (ref 5–15)
Anion gap: 8 (ref 5–15)
BUN: 18 mg/dL (ref 6–20)
BUN: 21 mg/dL — ABNORMAL HIGH (ref 6–20)
CO2: 25 mmol/L (ref 22–32)
CO2: 27 mmol/L (ref 22–32)
Calcium: 7.9 mg/dL — ABNORMAL LOW (ref 8.9–10.3)
Calcium: 8.3 mg/dL — ABNORMAL LOW (ref 8.9–10.3)
Chloride: 101 mmol/L (ref 98–111)
Chloride: 99 mmol/L (ref 98–111)
Creatinine, Ser: 0.88 mg/dL (ref 0.44–1.00)
Creatinine, Ser: 0.92 mg/dL (ref 0.44–1.00)
GFR, Estimated: >60 mL/min (ref >60)
GFR, Estimated: >60 mL/min (ref >60)
Glucose, Bld: 238 mg/dL — ABNORMAL HIGH (ref 70–99)
Glucose, Bld: 207 mg/dL — ABNORMAL HIGH (ref 70–99)
Potassium: 4.1 mmol/L (ref 3.5–5.1)
Potassium: 5.1 mmol/L (ref 3.5–5.1)
Sodium: 133 mmol/L — ABNORMAL LOW (ref 135–145)
Sodium: 134 mmol/L — ABNORMAL LOW (ref 135–145)

## 2021-10-25 LAB — CBC
HCT: 28.1 % — ABNORMAL LOW (ref 36.0–46.0)
Hemoglobin: 8.6 g/dL — ABNORMAL LOW (ref 12.0–15.0)
MCH: 26.1 pg (ref 26.0–34.0)
MCHC: 30.6 g/dL (ref 30.0–36.0)
MCV: 85.2 fL (ref 80.0–100.0)
Platelets: 390 10*3/uL (ref 150–400)
RBC: 3.3 MIL/uL — ABNORMAL LOW (ref 3.87–5.11)
RDW: 15.1 % (ref 11.5–15.5)
WBC: 12.4 10*3/uL — ABNORMAL HIGH (ref 4.0–10.5)
nRBC: 0 % (ref 0.0–0.2)

## 2021-10-25 LAB — MAGNESIUM
Magnesium: 1.8 mg/dL (ref 1.7–2.4)
Magnesium: 1.7 mg/dL (ref 1.7–2.4)

## 2021-10-25 LAB — PHOSPHORUS: Phosphorus: 3.6 mg/dL (ref 2.5–4.6)

## 2021-10-25 MED ORDER — INSULIN ASPART 100 UNIT/ML IJ SOLN
0.0000 [IU] | Freq: Three times a day (TID) | INTRAMUSCULAR | Status: DC
  Administered 2021-10-25: 3 [IU] via SUBCUTANEOUS

## 2021-10-25 MED ORDER — LORAZEPAM 2 MG/ML IJ SOLN
1.0000 mg | INTRAMUSCULAR | Status: DC | PRN
  Administered 2021-10-25 – 2021-10-26 (×3): 1 mg via INTRAVENOUS
  Filled 2021-10-25 (×3): qty 1

## 2021-10-25 MED ORDER — SODIUM CHLORIDE 0.9 % IV SOLN: INTRAVENOUS | Status: DC

## 2021-10-25 MED ORDER — INSULIN ASPART 100 UNIT/ML IJ SOLN
0.0000 [IU] | Freq: Three times a day (TID) | INTRAMUSCULAR | Status: DC
  Administered 2021-10-26: 13:00:00 8 [IU] via SUBCUTANEOUS
  Administered 2021-10-27 – 2021-10-28 (×2): 2 [IU] via SUBCUTANEOUS

## 2021-10-25 MED ORDER — CYCLOBENZAPRINE HCL 10 MG PO TABS
10.0000 mg | ORAL_TABLET | Freq: Three times a day (TID) | ORAL | Status: DC | PRN
  Administered 2021-10-25 – 2021-10-27 (×2): 10 mg via ORAL
  Filled 2021-10-25: qty 1

## 2021-10-25 MED ORDER — INSULIN ASPART 100 UNIT/ML IJ SOLN
0.0000 [IU] | Freq: Every day | INTRAMUSCULAR | Status: DC
  Administered 2021-10-26: 23:00:00 2 [IU] via SUBCUTANEOUS

## 2021-10-25 MED ORDER — INSULIN ASPART 100 UNIT/ML IJ SOLN
2.0000 [IU] | Freq: Once | INTRAMUSCULAR | Status: AC
  Administered 2021-10-25: 2 [IU] via SUBCUTANEOUS

## 2021-10-25 MED ORDER — TICAGRELOR 60 MG PO TABS
60.0000 mg | ORAL_TABLET | Freq: Two times a day (BID) | ORAL | Status: DC
  Administered 2021-10-25 – 2021-10-28 (×7): 60 mg via ORAL
  Filled 2021-10-25 (×9): qty 1

## 2021-10-25 MED ORDER — HYDROMORPHONE HCL 1 MG/ML IJ SOLN
2.0000 mg | INTRAMUSCULAR | Status: DC | PRN
  Administered 2021-10-25 – 2021-10-28 (×19): 2 mg via INTRAVENOUS
  Filled 2021-10-25 (×19): qty 2

## 2021-10-25 MED ORDER — ORAL CARE MOUTH RINSE: 15.0000 mL | Freq: Once | OROMUCOSAL | Status: AC

## 2021-10-25 MED ORDER — INSULIN ASPART 100 UNIT/ML IJ SOLN: 0.0000 [IU] | Freq: Every day | INTRAMUSCULAR | Status: DC

## 2021-10-25 MED ORDER — CHLORHEXIDINE GLUCONATE 0.12 % MT SOLN
15.0000 mL | Freq: Once | OROMUCOSAL | Status: AC
  Administered 2021-10-25: 15 mL via OROMUCOSAL
  Filled 2021-10-25: qty 15

## 2021-10-25 MED ORDER — LACTATED RINGERS IV SOLN: INTRAVENOUS | Status: DC

## 2021-10-25 NOTE — Progress Notes (Signed)
   10/25/21 1542  Assess: MEWS Score  Temp 98 F (36.7 C)  BP 131/90  Pulse Rate (!) 113  Resp 18  Level of Consciousness Alert  SpO2 100 %  O2 Device Room Air  Assess: MEWS Score  MEWS Temp 0  MEWS Systolic 0  MEWS Pulse 2  MEWS RR 0  MEWS LOC 0  MEWS Score 2  MEWS Score Color Yellow  Assess: if the MEWS score is Yellow or Red  Were vital signs taken at a resting state? Yes  Focused Assessment Change from prior assessment (see assessment flowsheet)  Early Detection of Sepsis Score *See Row Information* Low  MEWS guidelines implemented *See Row Information* Yes  Take Vital Signs  Increase Vital Sign Frequency  Yellow: Q 2hr X 2 then Q 4hr X 2, if remains yellow, continue Q 4hrs  Escalate  MEWS: Escalate Yellow: discuss with charge nurse/RN and consider discussing with provider and RRT  Notify: Charge Nurse/RN  Name of Charge Nurse/RN Notified Izora Gala RN  Date Charge Nurse/RN Notified 10/25/21  Time Charge Nurse/RN Notified 1829  Notify: Provider  Provider Name/Title Heath Lark MD  Date Provider Notified 10/25/21  Time Provider Notified 1555  Notification Type  (secure chat)  Notification Reason Change in status (Yellow MEWS)

## 2021-10-25 NOTE — Progress Notes (Signed)
PROGRESS NOTE    Anne [name]  FFM:[id number] DOB: 1981-08-29 DOA: 10/17/2021 PCP: Alfonse Flavors, MD   Brief Narrative:   40 year old with history of type 2 diabetes on insulin, peripheral vascular disease, hyperlipidemia, essential hypertension, previous transmetatarsal amputation right foot, chronic combined heart failure presented with 1 month of pain and swelling drainage and discharge from the left foot not relieved by outpatient therapies.  Consistently elevated blood sugars.  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  Assessment & Plan:   Principal Problem:   Osteomyelitis ([place]) Active Problems:   Type 2 diabetes mellitus with vascular disease ([place])   Hyperlipidemia associated with type 2 diabetes mellitus ([place])   Essential hypertension   Intractable nausea and vomiting   Osteomyelitis of second toe of left foot (HCC)   Preoperative cardiovascular examination  Diabetic foot ulcer/spreading cellulitis and necrosis with osteomyelitis of the foot s/p BKA POD #1 -Aggressive pain management per general surgery -IV antibiotics discontinued -PT evaluation pending with likely need for SNF   Type 2 diabetes on insulin, diabetic gastroparesis and diabetic foot ulcer: Peripheral neuropathy -Currently controlled with increase in SSI -Last known A1c 12.  -Continue to follow CBGs and further adjust hypoglycemia regimen as needed -Continue Lyrica and symptomatic treatment of her gastroparesis.   History of coronary artery disease/ischemic cardiomyopathy: Patient on dual antiplatelet therapy with aspirin and [name], last dose of [name] on 11/25.  Holding [name] but continuing aspirin.   -Currently without chest pain. -She is also on nitrates, statin, beta-blockers and ARB that is continued. -2D echocardiogram shows mostly chronic changes. Seen by cardiology, patient is at high risk for intervention but no need for further ischemic  work-up prior to surgery -Plan to resume [name] 60 mg twice daily and follow-up with cardiology outpatient   Anemia-stable -Mostly anemia of chronic disease. Combination of hemodilution, anemia of chronic disease. Iron studies.  Folic acid and T70 levels are essentially normal.   -PRBC 1 unit given 11/28; planning to receive 1 more unit of PRBC to further stabilize hemoglobin in anticipation to surgical procedure on 10/24/2021. -No signs of overt bleeding appreciated.     DVT prophylaxis: heparin injection 5,000 Units Start: 10/18/21 0600 SCDs Start: 10/18/21 0110   Code Status: Full code Family Communication: None Disposition Plan: Status is: Inpatient   Remains inpatient appropriate because: Significant foot infection and osteomyelitis needing IV antibiotics and inpatient surgical procedures.   Consultants:  General surgery Cardiology (for clearance)   Procedures:  None  Antimicrobials:  Vancomycin, cefepime and Flagyl 11/25>>12/3   Subjective: Patient seen and evaluated today with severe pain noted to her left BKA stump.  Objective: Vitals:   10/24/21 2020 10/25/21 0202 10/25/21 0518 10/25/21 0909  BP: 134/86 138/86 (!) 138/91 (!) 152/93  Pulse: 99 94 95 (!) 103  Resp: 14 18    Temp: 97.8 F (36.6 C) 97.7 F (36.5 C) 97.6 F (36.4 C)   TempSrc: Oral  Oral   SpO2: 98% 96% 99%   Weight:      Height:        Intake/Output Summary (Last 24 hours) at 10/25/2021 1130 Last data filed at 10/24/2021 2141 Gross per 24 hour  Intake 1574 ml  Output 200 ml  Net 1374 ml   Filed Weights   10/17/21 1841 10/18/21 0117  Weight: 76.2 kg 76 kg    Examination:  General exam: Appears calm and comfortable  Respiratory system: Clear to auscultation. Respiratory  effort normal. Cardiovascular system: S1 & S2 heard, RRR.  Gastrointestinal system: Abdomen is soft Central nervous system: Alert and awake Extremities: Left BKA stump with dressing clean dry and intact Skin: No  significant lesions noted Psychiatry: Flat affect.    Data Reviewed: I have personally reviewed following labs and imaging studies  CBC: Recent Labs  Lab 10/21/21 0530 10/22/21 0451 10/23/21 0458 10/24/21 0033 10/24/21 0505 10/25/21 0427  WBC 10.3 7.0 7.0 7.7 8.4 10.0  NEUTROABS 8.3* 5.2 4.8  --  5.3 7.9*  HGB 7.9* 7.3* 7.5* 8.5* 9.8* 8.3*  HCT 26.5* 24.8* 25.3* 27.4* 32.6* 27.7*  MCV 83.1 83.8 84.3 84.8 85.1 85.2  PLT 399 342 366 345 397 128   Basic Metabolic Panel: Recent Labs  Lab 10/19/21 0416 10/20/21 0344 10/21/21 0530 10/22/21 0451 10/24/21 0505 10/24/21 2217 10/25/21 0427  NA 133* 133* 135 135 134*  --  133*  K 3.6 4.6 5.0 4.4 4.4  --  4.1  CL 101 105 103 105 101  --  101  CO2 23 21* 25 23 26   --  25  GLUCOSE 134* 84 229* 146* 180* 443* 238*  BUN 14 14 22* 21* 23*  --  18  CREATININE 1.01* 0.99 0.93 0.94 0.96  --  0.88  CALCIUM 8.1* 8.4* 8.5* 8.4* 8.4*  --  7.9*  MG 1.9  --   --   --   --   --  1.8  PHOS 1.5* 2.4*  --   --   --   --   --    GFR: Estimated Creatinine Clearance: 87.9 mL/min (by C-G formula based on SCr of 0.88 mg/dL). Liver Function Tests: No results for input(s): AST, ALT, ALKPHOS, BILITOT, PROT, ALBUMIN in the last 168 hours. No results for input(s): LIPASE, AMYLASE in the last 168 hours. No results for input(s): AMMONIA in the last 168 hours. Coagulation Profile: No results for input(s): INR, PROTIME in the last 168 hours. Cardiac Enzymes: No results for input(s): CKTOTAL, CKMB, CKMBINDEX, TROPONINI in the last 168 hours. BNP (last 3 results) No results for input(s): PROBNP in the last 8760 hours. HbA1C: No results for input(s): HGBA1C in the last 72 hours. CBG: Recent Labs  Lab 10/24/21 1319 10/24/21 1603 10/24/21 2149 10/25/21 0006 10/25/21 0758  GLUCAP 128* 112* 415* 323* 148*   Lipid Profile: No results for input(s): CHOL, HDL, LDLCALC, TRIG, CHOLHDL, LDLDIRECT in the last 72 hours. Thyroid Function Tests: No results  for input(s): TSH, T4TOTAL, FREET4, T3FREE, THYROIDAB in the last 72 hours. Anemia Panel: No results for input(s): VITAMINB12, FOLATE, FERRITIN, TIBC, IRON, RETICCTPCT in the last 72 hours. Sepsis Labs: No results for input(s): PROCALCITON, LATICACIDVEN in the last 168 hours.  Recent Results (from the past 240 hour(s))  Resp Panel by RT-PCR (Flu A&B, Covid) Nasopharyngeal Swab     Status: None   Collection Time: 10/17/21 11:10 PM   Specimen: Nasopharyngeal Swab; Nasopharyngeal(NP) swabs in vial transport medium  Result Value Ref Range Status   SARS Coronavirus 2 by RT PCR NEGATIVE NEGATIVE Final    Comment: (NOTE) SARS-CoV-2 target nucleic acids are NOT DETECTED.  The SARS-CoV-2 RNA is generally detectable in upper respiratory specimens during the acute phase of infection. The lowest concentration of SARS-CoV-2 viral copies this assay can detect is 138 copies/mL. A negative result does not preclude SARS-Cov-2 infection and should not be used as the sole basis for treatment or other patient management decisions. A negative result may occur with  improper specimen collection/handling, submission of specimen other than nasopharyngeal swab, presence of viral mutation(s) within the areas targeted by this assay, and inadequate number of viral copies(<138 copies/mL). A negative result must be combined with clinical observations, patient history, and epidemiological information. The expected result is Negative.  Fact Sheet for Patients:  EntrepreneurPulse.com.au  Fact Sheet for Healthcare Providers:  IncredibleEmployment.be  This test is no t yet approved or cleared by the [place] FDA and  has been authorized for detection and/or diagnosis of SARS-CoV-2 by FDA under an Emergency Use Authorization (EUA). This EUA will remain  in effect (meaning this test can be used) for the duration of the COVID-19 declaration under Section 564(b)(1) of the Act,  21 U.S.C.section 360bbb-3(b)(1), unless the authorization is terminated  or revoked sooner.       Influenza A by PCR NEGATIVE NEGATIVE Final   Influenza B by PCR NEGATIVE NEGATIVE Final    Comment: (NOTE) The Xpert Xpress SARS-CoV-2/FLU/RSV plus assay is intended as an aid in the diagnosis of influenza from Nasopharyngeal swab specimens and should not be used as a sole basis for treatment. Nasal washings and aspirates are unacceptable for Xpert Xpress SARS-CoV-2/FLU/RSV testing.  Fact Sheet for Patients: EntrepreneurPulse.com.au  Fact Sheet for Healthcare Providers: IncredibleEmployment.be  This test is not yet approved or cleared by the [place] FDA and has been authorized for detection and/or diagnosis of SARS-CoV-2 by FDA under an Emergency Use Authorization (EUA). This EUA will remain in effect (meaning this test can be used) for the duration of the COVID-19 declaration under Section 564(b)(1) of the Act, 21 U.S.C. section 360bbb-3(b)(1), unless the authorization is terminated or revoked.  Performed at [facility], [address] [place], [place] [address]   Surgical pcr screen     Status: None   Collection Time: 10/24/21  5:35 AM   Specimen: Nasal Mucosa; Nasal Swab  Result Value Ref Range Status   MRSA, PCR NEGATIVE NEGATIVE Final   Staphylococcus aureus NEGATIVE NEGATIVE Final    Comment: (NOTE) The Xpert SA Assay (FDA approved for NASAL specimens in patients 2 years of age and older), is one component of a comprehensive surveillance program. It is not intended to diagnose infection nor to guide or monitor treatment. Performed at [facility], [address] [place], [place] [address]          Radiology Studies: No results found.      Scheduled Meds:  (feeding supplement) PROSource Plus  30 mL Oral BID BM   vitamin C  500 mg Oral BID   aspirin EC  81 mg Oral Q breakfast   atorvastatin  80 mg Oral QPM    carvedilol  3.125 mg Oral BID   ezetimibe  10 mg Oral Daily   fenofibrate  54 mg Oral Daily   heparin  5,000 Units Subcutaneous Q8H   insulin aspart  0-20 Units Subcutaneous TID WC   insulin aspart  0-5 Units Subcutaneous QHS   insulin aspart  15 Units Subcutaneous TID WC   insulin detemir  40 Units Subcutaneous QHS   isosorbide mononitrate  30 mg Oral Daily   losartan  25 mg Oral QPM   multivitamin with minerals  1 tablet Oral Daily   nutrition supplement (JUVEN)  1 packet Oral BID BM   phosphorus  500 mg Oral TID   pregabalin  75 mg Oral Daily   Ensure Max Protein  11 oz Oral BID   sertraline  200 mg Oral QHS  zinc sulfate  220 mg Oral Daily   Continuous Infusions:  promethazine (PHENERGAN) injection (IM or IVPB) Stopped (10/22/21 1729)     LOS: 7 days    Time spent: 35 minutes    Prue Lingenfelter Darleen Crocker, DO [facility]  If 7PM-7AM, please contact night-coverage [email or web address] 10/25/2021, 11:30 AM

## 2021-10-25 NOTE — Progress Notes (Signed)
Hypoglycemic Event  CBG: 64 mg/dL  Treatment: 4 oz juice/soda  Symptoms: None  Follow-up CBG: Time:1217 CBG Result: 75 mg/dL  Possible Reasons for Event: Unknown  Comments/MD notified: Patient reports no s/s of distress. Patient given 4 oz of juice. MD Manuella Ghazi made aware.     Ginger Organ

## 2021-10-25 NOTE — Progress Notes (Signed)
Pharmacy Antibiotic Note  Anne [name] is a 40 y.o. female admitted on 10/17/2021 with  suspected diabetic foot infection .  Pharmacy has been consulted for Vancomycin and Cefepime dosing x 7 days. Pt also on po Flagyl. Plan is to monitor the midportion of the left foot over the next 24 to 48 hours.  Should it not improve, surgery will proceed with a left below the knee amputation. Vancomycin Tr level= 32mcg/ml. Within range for osteomyelitis.  Plan: Continue Cefepime 2gm IV q8h x 7 days Continue Vancomycin 1000 mg IV Q 12 hrs x 7 days. Goal AUC 400-550. Expected AUC: 523, SCr used: 1.01 Will f/u renal function, micro data, and pt's clinical condition Vanc levels prn  Height: 5\' 9"  (175.3 cm) Weight: 76 kg (167 lb 8.8 oz) IBW/kg (Calculated) : 66.2  Temp (24hrs), Avg:97.9 F (36.6 C), Min:97.6 F (36.4 C), Max:98.5 F (36.9 C)  Recent Labs  Lab 10/20/21 0344 10/21/21 0530 10/22/21 0451 10/22/21 1232 10/23/21 0458 10/24/21 0033 10/24/21 0505 10/25/21 0427  WBC 10.9* 10.3 7.0  --  7.0 7.7 8.4 10.0  CREATININE 0.99 0.93 0.94  --   --   --  0.96 0.88  VANCOTROUGH  --   --   --  19  --   --   --   --      Estimated Creatinine Clearance: 87.9 mL/min (by C-G formula based on SCr of 0.88 mg/dL).    Allergies  Allergen Reactions   Canagliflozin Other (See Comments)    Vaginal yeast infections   Nsaids Other (See Comments)    Yeast infection     Antimicrobials this admission: 11/26 Vanc >> 12/2 11/26 Cefepime >> 12/2 11/26 Flagyl >>12/2  Microbiology results: Pending  Thank you for allowing pharmacy to be a part of this patient's care.  Hart Robinsons, PharmD Clinical Pharmacist 10/25/2021 9:09 AM

## 2021-10-25 NOTE — Plan of Care (Signed)
  Problem: Clinical Measurements: Goal: Will remain free from infection Outcome: Not Progressing   Problem: Clinical Measurements: Goal: Cardiovascular complication will be avoided Outcome: Not Progressing   Problem: Coping: Goal: Level of anxiety will decrease Outcome: Not Progressing   Problem: Elimination: Goal: Will not experience complications related to bowel motility Outcome: Not Progressing   Problem: Pain Managment: Goal: General experience of comfort will improve Outcome: Not Progressing   Problem: Safety: Goal: Ability to remain free from injury will improve Outcome: Not Progressing

## 2021-10-25 NOTE — Progress Notes (Signed)
   10/25/21 1542 10/25/21 1720  Assess: MEWS Score  Temp 98 F (36.7 C) 98.7 F (37.1 C)  BP 131/90 (!) 135/93  Pulse Rate (!) 113 (!) 120  Resp 18 18  SpO2  --  99 %  O2 Device  --  Room Air  O2 Flow Rate (L/min)  --  0 L/min  Assess: MEWS Score  MEWS Temp 0 0  MEWS Systolic 0 0  MEWS Pulse 2 2  MEWS RR 0 0  MEWS LOC 0 0  MEWS Score 2 2  MEWS Score Color Yellow Yellow  Assess: if the MEWS score is Yellow or Red  Were vital signs taken at a resting state?  --  Yes  Focused Assessment  --  No change from prior assessment  Early Detection of Sepsis Score *See Row Information*  --  Low  MEWS guidelines implemented *See Row Information*  --  Yes  Take Vital Signs  Increase Vital Sign Frequency   --  Yellow: Q 2hr X 2 then Q 4hr X 2, if remains yellow, continue Q 4hrs  Escalate  MEWS: Escalate  --  Yellow: discuss with charge nurse/RN and consider discussing with provider and RRT  Notify: Charge Nurse/RN  Name of Charge Nurse/RN Notified  --  Marine scientist  Date Charge Nurse/RN Notified  --  10/25/21  Time Charge Nurse/RN Notified  --  1856

## 2021-10-25 NOTE — Progress Notes (Signed)
1 Day Post-Op  Subjective: Patient having moderate to severe incisional pain along with muscle spasms.  Objective: Vital signs in last 24 hours: Temp:  [97.6 F (36.4 C)-98.5 F (36.9 C)] 97.6 F (36.4 C) (12/03 0518) Pulse Rate:  [85-103] 103 (12/03 0909) Resp:  [14-26] 18 (12/03 0202) BP: (118-171)/(80-121) 152/93 (12/03 0909) SpO2:  [95 %-100 %] 99 % (12/03 0518) Last BM Date: 10/24/21  Intake/Output from previous day: 12/02 0701 - 12/03 0700 In: 1574 [P.O.:474; I.V.:1100] Out: 200 [Blood:200] Intake/Output this shift: No intake/output data recorded.  General appearance: alert and cooperative Extremities: Left BKA dressing dry and intact.  Lab Results:  Recent Labs    10/24/21 0505 10/25/21 0427  WBC 8.4 10.0  HGB 9.8* 8.3*  HCT 32.6* 27.7*  PLT 397 366   BMET Recent Labs    10/24/21 0505 10/24/21 2217 10/25/21 0427  NA 134*  --  133*  K 4.4  --  4.1  CL 101  --  101  CO2 26  --  25  GLUCOSE 180* 443* 238*  BUN 23*  --  18  CREATININE 0.96  --  0.88  CALCIUM 8.4*  --  7.9*   PT/INR No results for input(s): LABPROT, INR in the last 72 hours.  Studies/Results: No results found.  Anti-infectives: Anti-infectives (From admission, onward)    Start     Dose/Rate Route Frequency Ordered Stop   10/24/21 2200  metroNIDAZOLE (FLAGYL) tablet 500 mg        500 mg Oral Every 12 hours 10/24/21 1244     10/20/21 1400  vancomycin (VANCOREADY) IVPB 1000 mg/200 mL        1,000 mg 200 mL/hr over 60 Minutes Intravenous Every 12 hours 10/20/21 1100     10/18/21 0015  metroNIDAZOLE (FLAGYL) tablet 500 mg        500 mg Oral Every 12 hours 10/18/21 0003 10/23/21 2107   10/18/21 0015  vancomycin (VANCOREADY) IVPB 1500 mg/300 mL  Status:  Discontinued        1,500 mg 150 mL/hr over 120 Minutes Intravenous Daily at bedtime 10/18/21 0010 10/20/21 1100   10/18/21 0015  ceFEPIme (MAXIPIME) 2 g in sodium chloride 0.9 % 100 mL IVPB        2 g 200 mL/hr over 30 Minutes  Intravenous Every 8 hours 10/18/21 0010 10/25/21 0014       Assessment/Plan: s/p Procedure(s): AMPUTATION BELOW KNEE Impression: Stable on postoperative day 1.  Is having significant incisional pain and spasm.  Will increase Dilaudid to 2 mg every 3 hours as needed pain.  We will add Flexeril for muscle spasms.  Patient amenable to going to SNF upon discharge.  LOS: 7 days    Aviva Signs 10/25/2021

## 2021-10-26 DIAGNOSIS — M869 Osteomyelitis, unspecified: Secondary | ICD-10-CM | POA: Diagnosis not present

## 2021-10-26 LAB — CBC
HCT: 26.5 % — ABNORMAL LOW (ref 36.0–46.0)
Hemoglobin: 8.2 g/dL — ABNORMAL LOW (ref 12.0–15.0)
MCH: 26.4 pg (ref 26.0–34.0)
MCHC: 30.9 g/dL (ref 30.0–36.0)
MCV: 85.2 fL (ref 80.0–100.0)
Platelets: 373 10*3/uL (ref 150–400)
RBC: 3.11 MIL/uL — ABNORMAL LOW (ref 3.87–5.11)
RDW: 15.5 % (ref 11.5–15.5)
WBC: 12.8 10*3/uL — ABNORMAL HIGH (ref 4.0–10.5)
nRBC: 0 % (ref 0.0–0.2)

## 2021-10-26 LAB — GLUCOSE, CAPILLARY
Glucose-Capillary: 122 mg/dL — ABNORMAL HIGH (ref 70–99)
Glucose-Capillary: 163 mg/dL — ABNORMAL HIGH (ref 70–99)
Glucose-Capillary: 203 mg/dL — ABNORMAL HIGH (ref 70–99)
Glucose-Capillary: 281 mg/dL — ABNORMAL HIGH (ref 70–99)
Glucose-Capillary: 63 mg/dL — ABNORMAL LOW (ref 70–99)
Glucose-Capillary: 74 mg/dL (ref 70–99)

## 2021-10-26 LAB — BASIC METABOLIC PANEL
Anion gap: 8 (ref 5–15)
BUN: 18 mg/dL (ref 6–20)
CO2: 24 mmol/L (ref 22–32)
Calcium: 8.1 mg/dL — ABNORMAL LOW (ref 8.9–10.3)
Chloride: 103 mmol/L (ref 98–111)
Creatinine, Ser: 0.75 mg/dL (ref 0.44–1.00)
GFR, Estimated: >60 mL/min (ref >60)
Glucose, Bld: 114 mg/dL — ABNORMAL HIGH (ref 70–99)
Potassium: 4.2 mmol/L (ref 3.5–5.1)
Sodium: 135 mmol/L (ref 135–145)

## 2021-10-26 LAB — MAGNESIUM: Magnesium: 1.7 mg/dL (ref 1.7–2.4)

## 2021-10-26 MED ORDER — METAXALONE 800 MG PO TABS
400.0000 mg | ORAL_TABLET | Freq: Four times a day (QID) | ORAL | Status: DC
  Administered 2021-10-26 – 2021-10-28 (×9): 400 mg via ORAL
  Filled 2021-10-26 (×8): qty 1

## 2021-10-26 MED ORDER — INSULIN DETEMIR 100 UNIT/ML ~~LOC~~ SOLN
30.0000 [IU] | Freq: Every day | SUBCUTANEOUS | Status: DC
  Administered 2021-10-26 – 2021-10-27 (×2): 30 [IU] via SUBCUTANEOUS
  Filled 2021-10-26 (×3): qty 0.3

## 2021-10-26 NOTE — Progress Notes (Signed)
Patient was found in floor by staff.  Patient was attempting to use [facility] without assistance from staff.  Patient was assisted back into bed by staff, no visible bruising at this time.  Patient stated she had pain from her stump  and that she got up because " she forgot she did not have a leg."  Doctor notified and vitals assistance and patient made a high fall risk.

## 2021-10-26 NOTE — Progress Notes (Signed)
2 Days Post-Op  Subjective: Patient still having incisional pain in the left lower extremity.  She apparently fell yesterday evening.  Objective: Vital signs in last 24 hours: Temp:  [97.9 F (36.6 C)-99 F (37.2 C)] 99 F (37.2 C) (12/04 0539) Pulse Rate:  [93-130] 93 (12/04 0539) Resp:  [18-20] 18 (12/04 0539) BP: (130-135)/(83-112) 132/83 (12/04 0539) SpO2:  [95 %-100 %] 95 % (12/04 0539) Last BM Date: 10/24/21  Intake/Output from previous day: 12/03 0701 - 12/04 0700 In: 2092.8 [P.O.:960; I.V.:1073; IV Piggyback:59.7] Out: -  Intake/Output this shift: No intake/output data recorded.  General appearance: alert, cooperative, and fatigued Extremities: Left BKA dressing dry and intact.  Lab Results:  Recent Labs    10/25/21 1946 10/26/21 0533  WBC 12.4* 12.8*  HGB 8.6* 8.2*  HCT 28.1* 26.5*  PLT 390 373   BMET Recent Labs    10/25/21 1946 10/26/21 0533  NA 134* 135  K 5.1 4.2  CL 99 103  CO2 27 24  GLUCOSE 207* 114*  BUN 21* 18  CREATININE 0.92 0.75  CALCIUM 8.3* 8.1*   PT/INR No results for input(s): LABPROT, INR in the last 72 hours.  Studies/Results: No results found.  Anti-infectives: Anti-infectives (From admission, onward)    Start     Dose/Rate Route Frequency Ordered Stop   10/24/21 2200  metroNIDAZOLE (FLAGYL) tablet 500 mg  Status:  Discontinued        500 mg Oral Every 12 hours 10/24/21 1244 10/25/21 1130   10/20/21 1400  vancomycin (VANCOREADY) IVPB 1000 mg/200 mL  Status:  Discontinued        1,000 mg 200 mL/hr over 60 Minutes Intravenous Every 12 hours 10/20/21 1100 10/25/21 1130   10/18/21 0015  metroNIDAZOLE (FLAGYL) tablet 500 mg        500 mg Oral Every 12 hours 10/18/21 0003 10/23/21 2107   10/18/21 0015  vancomycin (VANCOREADY) IVPB 1500 mg/300 mL  Status:  Discontinued        1,500 mg 150 mL/hr over 120 Minutes Intravenous Daily at bedtime 10/18/21 0010 10/20/21 1100   10/18/21 0015  ceFEPIme (MAXIPIME) 2 g in sodium chloride  0.9 % 100 mL IVPB        2 g 200 mL/hr over 30 Minutes Intravenous Every 8 hours 10/18/21 0010 10/25/21 0014       Assessment/Plan: s/p Procedure(s): AMPUTATION BELOW KNEE Impression: Postoperative day 2.  Pain control is still an issue.  We will add Skelaxin.  She states the Flexeril may be decreasing the muscle spasms.  Will check wound in a.m.  Patient is stable to go to SNF from the surgery standpoint.  LOS: 8 days    Aviva Signs 10/26/2021

## 2021-10-26 NOTE — TOC Initial Note (Signed)
Transition of Care [facility]) - Initial/Assessment Note    Patient Details  Name: Anne [name] MRN: [id number] Date of Birth: 07-Sep-1981  Transition of Care [facility]) CM/SW Contact:    Natasha Bence, LCSW Phone Number: 10/26/2021, 12:05 PM  Clinical Narrative:                 Patient is a 40 year old female admitted for Osteomyelitis. CSW conducted initial assessment. Patient reported that at baseline, she needs little assistance completing ADL's. Patient agreeable to [facility] referral. Patient requested referral to [facility]. CSW referred patient to [place] with [facility]. Corene Cornea agreeable to provide [facility] services. TOC to follow.   Expected Discharge Plan: [place] Barriers to Discharge: Continued Medical Work up   Patient Goals and CMS Choice Patient states their goals for this hospitalization and ongoing recovery are:: to get better CMS Medicare.gov Compare Post Acute Care list provided to:: Patient Choice offered to / list presented to : Patient  Expected Discharge Plan and Services Expected Discharge Plan: [place]   Discharge Planning Services: CM Consult   Living arrangements for the past 2 months: Single Family Home                           HH Arranged: RN, PT [facility] Agency: [place] ([facility]) Date HH Agency Contacted: 10/25/21 Time HH Agency Contacted: 0100 Representative spoke with at [place]: [place] Arrangements/Services Living arrangements for the past 2 months: [place] with:: Spouse Patient language and need for interpreter reviewed:: Yes Do you feel safe going back to the place where you live?: Yes      Need for Family Participation in Patient Care: No (Comment) Care giver support system in place?: Yes (comment)   Criminal Activity/Legal Involvement Pertinent to Current Situation/Hospitalization: No - Comment as needed  Activities of Daily Living Home Assistive Devices/Equipment: Environmental consultant  (specify type), Bedside commode/3-in-1, Transfer board, Wheelchair ADL Screening (condition at time of admission) Patient's cognitive ability adequate to safely complete daily activities?: Yes Is the patient deaf or have difficulty hearing?: No Does the patient have difficulty seeing, even when wearing glasses/contacts?: No Does the patient have difficulty concentrating, remembering, or making decisions?: No Patient able to express need for assistance with ADLs?: Yes Does the patient have difficulty dressing or bathing?: Yes Independently performs ADLs?: No Communication: Independent Dressing (OT): Needs assistance Is this a change from baseline?: Pre-admission baseline Grooming: Independent Feeding: Independent Bathing: Needs assistance Is this a change from baseline?: Pre-admission baseline Toileting: Needs assistance Is this a change from baseline?: Pre-admission baseline In/Out Bed: Needs assistance Is this a change from baseline?: Pre-admission baseline Walks in Home: Needs assistance Is this a change from baseline?: Pre-admission baseline Does the patient have difficulty walking or climbing stairs?: Yes Weakness of Legs: Both Weakness of Arms/Hands: None  Permission Sought/Granted Permission sought to share information with : Family Supports Permission granted to share information with : Yes, Verbal Permission Granted  Share Information with NAME: Paul Half  Permission granted to share info w AGENCY: Local HH  Permission granted to share info w Relationship: (Spouse)  Permission granted to share info w Contact Information: [phone]  Emotional Assessment Appearance:: Appears stated age   Affect (typically observed): Accepting, Adaptable Orientation: : Oriented to Self, Oriented to Place, Oriented to  Time, Oriented to Situation Alcohol / Substance Use: Not Applicable Psych Involvement: No (comment)  Admission diagnosis:  Hypokalemia [E87.6] Osteomyelitis ([place])  [M86.9] Osteomyelitis of second toe of left foot [facility]) [M86.9] Patient Active Problem List   Diagnosis Date Noted   Preoperative cardiovascular examination    Osteomyelitis of second toe of left foot (HCC)    Intractable nausea and vomiting 03/09/2020   ACS (acute coronary syndrome) ([place]) 03/08/2020   Hypercholesteremia    Chest pain 07/86/7544   Chronic systolic CHF (congestive heart failure) ([place]) 11/28/2018   S/P PTCA (percutaneous transluminal coronary angioplasty)    CAD (coronary artery disease) 11/13/2018   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[place])  11/13/2018   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 11/11/2018   Osteomyelitis ([place]) 07/01/2018   Personal history of noncompliance with medical treatment, presenting hazards to health 10/28/2015   Multiple nevi 10/07/2012   Toe ulcer ([place]) 03/11/2012   Essential hypertension 03/11/2012   Type 2 diabetes mellitus with vascular disease ([place]) 02/11/2012   Diabetic neuropathy ([place]) 02/11/2012   Back pain 02/11/2012   Muscle spasm 02/11/2012   Hyperlipidemia associated with type 2 diabetes mellitus ([place]) 02/11/2012   Obesity 02/11/2012   Cellulitis in diabetic foot ([place]) 10/31/2011   PCP:  Alfonse Flavors, MD Pharmacy:   [place], [place] - [address] [place] [address] Phone: [phone] Fax: [phone]     Social Determinants of Health (SDOH) Interventions    Readmission Risk Interventions Readmission Risk Prevention Plan 10/26/2021 10/24/2021  Transportation Screening Complete -  [place] or [place] Complete Complete  Social Work Consult for [place] Planning/Counseling Complete Complete  Palliative Care Screening Not Applicable Not Applicable  Medication Review Press photographer) Complete Complete  Some recent data might be hidden

## 2021-10-26 NOTE — Progress Notes (Signed)
PROGRESS NOTE    Anne [name]  GEX:[id number] DOB: 06/26/1981 DOA: 10/17/2021 PCP: Alfonse Flavors, MD   Brief Narrative:   40 year old with history of type 2 diabetes on insulin, peripheral vascular disease, hyperlipidemia, essential hypertension, previous transmetatarsal amputation right foot, chronic combined heart failure presented with 1 month of pain and swelling drainage and discharge from the left foot not relieved by outpatient therapies.  Consistently elevated blood sugars.  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  She will need SNF placement on discharge and was noted to have a fall on 12/3.  Assessment & Plan:   Principal Problem:   Osteomyelitis ([place]) Active Problems:   Type 2 diabetes mellitus with vascular disease ([place])   Hyperlipidemia associated with type 2 diabetes mellitus ([place])   Essential hypertension   Intractable nausea and vomiting   Osteomyelitis of second toe of left foot (HCC)   Preoperative cardiovascular examination   Diabetic foot ulcer/spreading cellulitis and necrosis with osteomyelitis of the foot s/p BKA POD #2 -Aggressive pain management per general surgery; Skelaxin added 12/4 -IV antibiotics discontinued -PT evaluation pending with likely need for SNF   Type 2 diabetes on insulin, diabetic gastroparesis and diabetic foot ulcer: Peripheral neuropathy -Currently with hypoglycemic episodes for which long-acting insulin will be decreased from 40 units to 30 units on 12/4 -Last known A1c 12.  -Continue to follow CBGs and further adjust hypoglycemia regimen as needed -Continue Lyrica and symptomatic treatment of her gastroparesis.   History of coronary artery disease/ischemic cardiomyopathy: Patient on dual antiplatelet therapy with aspirin and [name], last dose of [name] on 11/25.  Holding [name] but continuing aspirin.   -Currently without chest pain. -She is also on nitrates, statin,  beta-blockers and ARB that is continued. -2D echocardiogram shows mostly chronic changes. Seen by cardiology, patient is at high risk for intervention but no need for further ischemic work-up prior to surgery -Resumed [name] 60 mg twice daily and follow-up with cardiology outpatient   Anemia-stable -Mostly anemia of chronic disease. Combination of hemodilution, anemia of chronic disease. Iron studies.  Folic acid and W10 levels are essentially normal.   -PRBC 1 unit given 11/28; planning to receive 1 more unit of PRBC to further stabilize hemoglobin in anticipation to surgical procedure on 10/24/2021. -No signs of overt bleeding appreciated.     DVT prophylaxis: heparin injection 5,000 Units Start: 10/18/21 0600 SCDs Start: 10/18/21 0110   Code Status: Full code Family Communication: None Disposition Plan: Status is: Inpatient   Remains inpatient appropriate because: Significant foot infection and osteomyelitis needing IV antibiotics and inpatient surgical procedures.   Consultants:  General surgery Cardiology (for clearance)   Procedures:  None   Antimicrobials:  Vancomycin, cefepime and Flagyl 11/25>>12/3  Subjective: Patient seen and evaluated today with ongoing pain still noted.  She apparently fell yesterday evening, but with no significant injuries.  She is also noted to have episodes of hypoglycemia this morning and was having some shaking.  Objective: Vitals:   10/25/21 2051 10/26/21 0048 10/26/21 0409 10/26/21 0539  BP: 133/86 (!) 130/97 (!) 135/112 132/83  Pulse: (!) 123 (!) 130 (!) 114 93  Resp: 18 20 18 18   Temp: 98.2 F (36.8 C) 97.9 F (36.6 C) 98.1 F (36.7 C) 99 F (37.2 C)  TempSrc: Oral Oral    SpO2: 95% 99% 99% 95%  Weight:      Height:        Intake/Output Summary (Last  24 hours) at 10/26/2021 1016 Last data filed at 10/26/2021 0559 Gross per 24 hour  Intake 1852.75 ml  Output --  Net 1852.75 ml   Filed Weights   10/17/21 1841 10/18/21  0117  Weight: 76.2 kg 76 kg    Examination:  General exam: Appears to be in mild distress from pain Respiratory system: Clear to auscultation. Respiratory effort normal. Cardiovascular system: S1 & S2 heard, RRR.  Gastrointestinal system: Abdomen is soft Central nervous system: Alert and awake Extremities: Left BKA dressing clean dry and intact. Skin: No significant lesions noted Psychiatry: Flat affect.    Data Reviewed: I have personally reviewed following labs and imaging studies  CBC: Recent Labs  Lab 10/21/21 0530 10/22/21 0451 10/23/21 0458 10/24/21 0033 10/24/21 0505 10/25/21 0427 10/25/21 1946 10/26/21 0533  WBC 10.3 7.0 7.0 7.7 8.4 10.0 12.4* 12.8*  NEUTROABS 8.3* 5.2 4.8  --  5.3 7.9*  --   --   HGB 7.9* 7.3* 7.5* 8.5* 9.8* 8.3* 8.6* 8.2*  HCT 26.5* 24.8* 25.3* 27.4* 32.6* 27.7* 28.1* 26.5*  MCV 83.1 83.8 84.3 84.8 85.1 85.2 85.2 85.2  PLT 399 342 366 345 397 366 390 329   Basic Metabolic Panel: Recent Labs  Lab 10/20/21 0344 10/21/21 0530 10/22/21 0451 10/24/21 0505 10/24/21 2217 10/25/21 0427 10/25/21 1946 10/26/21 0533  NA 133*   < > 135 134*  --  133* 134* 135  K 4.6   < > 4.4 4.4  --  4.1 5.1 4.2  CL 105   < > 105 101  --  101 99 103  CO2 21*   < > 23 26  --  25 27 24   GLUCOSE 84   < > 146* 180* 443* 238* 207* 114*  BUN 14   < > 21* 23*  --  18 21* 18  CREATININE 0.99   < > 0.94 0.96  --  0.88 0.92 0.75  CALCIUM 8.4*   < > 8.4* 8.4*  --  7.9* 8.3* 8.1*  MG  --   --   --   --   --  1.8 1.7 1.7  PHOS 2.4*  --   --   --   --   --  3.6  --    < > = values in this interval not displayed.   GFR: Estimated Creatinine Clearance: 96.7 mL/min (by C-G formula based on SCr of 0.75 mg/dL). Liver Function Tests: No results for input(s): AST, ALT, ALKPHOS, BILITOT, PROT, ALBUMIN in the last 168 hours. No results for input(s): LIPASE, AMYLASE in the last 168 hours. No results for input(s): AMMONIA in the last 168 hours. Coagulation Profile: No results  for input(s): INR, PROTIME in the last 168 hours. Cardiac Enzymes: No results for input(s): CKTOTAL, CKMB, CKMBINDEX, TROPONINI in the last 168 hours. BNP (last 3 results) No results for input(s): PROBNP in the last 8760 hours. HbA1C: No results for input(s): HGBA1C in the last 72 hours. CBG: Recent Labs  Lab 10/25/21 1715 10/25/21 2049 10/26/21 0050 10/26/21 0815 10/26/21 0847  GLUCAP 114* 191* 163* 63* 122*   Lipid Profile: No results for input(s): CHOL, HDL, LDLCALC, TRIG, CHOLHDL, LDLDIRECT in the last 72 hours. Thyroid Function Tests: No results for input(s): TSH, T4TOTAL, FREET4, T3FREE, THYROIDAB in the last 72 hours. Anemia Panel: No results for input(s): VITAMINB12, FOLATE, FERRITIN, TIBC, IRON, RETICCTPCT in the last 72 hours. Sepsis Labs: No results for input(s): PROCALCITON, LATICACIDVEN in the last 168 hours.  Recent Results (  from the past 240 hour(s))  Resp Panel by RT-PCR (Flu A&B, Covid) Nasopharyngeal Swab     Status: None   Collection Time: 10/17/21 11:10 PM   Specimen: Nasopharyngeal Swab; Nasopharyngeal(NP) swabs in vial transport medium  Result Value Ref Range Status   SARS Coronavirus 2 by RT PCR NEGATIVE NEGATIVE Final    Comment: (NOTE) SARS-CoV-2 target nucleic acids are NOT DETECTED.  The SARS-CoV-2 RNA is generally detectable in upper respiratory specimens during the acute phase of infection. The lowest concentration of SARS-CoV-2 viral copies this assay can detect is 138 copies/mL. A negative result does not preclude SARS-Cov-2 infection and should not be used as the sole basis for treatment or other patient management decisions. A negative result may occur with  improper specimen collection/handling, submission of specimen other than nasopharyngeal swab, presence of viral mutation(s) within the areas targeted by this assay, and inadequate number of viral copies(<138 copies/mL). A negative result must be combined with clinical observations,  patient history, and epidemiological information. The expected result is Negative.  Fact Sheet for Patients:  EntrepreneurPulse.com.au  Fact Sheet for Healthcare Providers:  IncredibleEmployment.be  This test is no t yet approved or cleared by the [place] FDA and  has been authorized for detection and/or diagnosis of SARS-CoV-2 by FDA under an Emergency Use Authorization (EUA). This EUA will remain  in effect (meaning this test can be used) for the duration of the COVID-19 declaration under Section 564(b)(1) of the Act, 21 U.S.C.section 360bbb-3(b)(1), unless the authorization is terminated  or revoked sooner.       Influenza A by PCR NEGATIVE NEGATIVE Final   Influenza B by PCR NEGATIVE NEGATIVE Final    Comment: (NOTE) The Xpert Xpress SARS-CoV-2/FLU/RSV plus assay is intended as an aid in the diagnosis of influenza from Nasopharyngeal swab specimens and should not be used as a sole basis for treatment. Nasal washings and aspirates are unacceptable for Xpert Xpress SARS-CoV-2/FLU/RSV testing.  Fact Sheet for Patients: EntrepreneurPulse.com.au  Fact Sheet for Healthcare Providers: IncredibleEmployment.be  This test is not yet approved or cleared by the [place] FDA and has been authorized for detection and/or diagnosis of SARS-CoV-2 by FDA under an Emergency Use Authorization (EUA). This EUA will remain in effect (meaning this test can be used) for the duration of the COVID-19 declaration under Section 564(b)(1) of the Act, 21 U.S.C. section 360bbb-3(b)(1), unless the authorization is terminated or revoked.  Performed at [facility], [address] [place], [place] [address]   Surgical pcr screen     Status: None   Collection Time: 10/24/21  5:35 AM   Specimen: Nasal Mucosa; Nasal Swab  Result Value Ref Range Status   MRSA, PCR NEGATIVE NEGATIVE Final   Staphylococcus aureus NEGATIVE  NEGATIVE Final    Comment: (NOTE) The Xpert SA Assay (FDA approved for NASAL specimens in patients 48 years of age and older), is one component of a comprehensive surveillance program. It is not intended to diagnose infection nor to guide or monitor treatment. Performed at [facility], [address] [place], [place] [address]          Radiology Studies: No results found.      Scheduled Meds:  (feeding supplement) PROSource Plus  30 mL Oral BID BM   vitamin C  500 mg Oral BID   aspirin EC  81 mg Oral Q breakfast   atorvastatin  80 mg Oral QPM   carvedilol  3.125 mg Oral BID   ezetimibe  10  mg Oral Daily   fenofibrate  54 mg Oral Daily   heparin  5,000 Units Subcutaneous Q8H   insulin aspart  0-15 Units Subcutaneous TID WC   insulin aspart  0-5 Units Subcutaneous QHS   insulin aspart  15 Units Subcutaneous TID WC   insulin detemir  30 Units Subcutaneous QHS   isosorbide mononitrate  30 mg Oral Daily   losartan  25 mg Oral QPM   metaxalone  400 mg Oral QID   multivitamin with minerals  1 tablet Oral Daily   nutrition supplement (JUVEN)  1 packet Oral BID BM   phosphorus  500 mg Oral TID   pregabalin  75 mg Oral Daily   Ensure Max Protein  11 oz Oral BID   sertraline  200 mg Oral QHS   ticagrelor  60 mg Oral BID   zinc sulfate  220 mg Oral Daily   Continuous Infusions:  sodium chloride 100 mL/hr at 10/25/21 2006   lactated ringers 10 mL/hr at 10/25/21 2052   promethazine (PHENERGAN) injection (IM or IVPB) 200 mL/hr at 10/25/21 1310     LOS: 8 days    Time spent: 35 minutes    Deondrea Markos Darleen Crocker, DO [facility]  If 7PM-7AM, please contact night-coverage [email or web address] 10/26/2021, 10:16 AM

## 2021-10-26 NOTE — Progress Notes (Signed)
Hypoglycemic Event  CBG: 63 mg/dL  Treatment: 4 oz juice/soda  Symptoms: Shaky  Follow-up CBG: [id number]:[id number] CBG Result:122 mg/dL  Possible Reasons for Event: Unknown  Comments/MD notified: Patient stated she felt shaky but did not believe it was related to her Blood glucose being low. Patient given juice, held insulin due to Blood glucose 63 mg/dL per MD. MD Manuella Ghazi made aware.     Ginger Organ

## 2021-10-26 NOTE — Progress Notes (Signed)
   10/26/21 0048  Assess: MEWS Score  Temp 97.9 F (36.6 C)  BP (!) 130/97  Pulse Rate (!) 130  Resp 20  SpO2 99 %  O2 Device Room Air  Assess: MEWS Score  MEWS Temp 0  MEWS Systolic 0  MEWS Pulse 3  MEWS RR 0  MEWS LOC 0  MEWS Score 3  MEWS Score Color Yellow  Assess: if the MEWS score is Yellow or Red  Were vital signs taken at a resting state? No  Focused Assessment No change from prior assessment  Early Detection of Sepsis Score *See Row Information* Low  MEWS guidelines implemented *See Row Information* No, previously yellow, continue vital signs every 4 hours  Treat  MEWS Interventions Administered prn meds/treatments  Pain Scale 0-10  Pain Score 10  Pain Type Surgical pain;Phantom pain  Pain Location Leg  Pain Orientation Other (Comment) (Amputation)  Pain Radiating Towards Knee  Pain Descriptors / Indicators Crying;Discomfort;Moaning;Operative site guarding;Pounding;Pressure;Sharp;Shooting;Squeezing;Stabbing;Throbbing  Pain Frequency Constant  Pain Onset Awakened from sleep  Pain Intervention(s) Medication (See eMAR);RN made aware  Take Vital Signs  Increase Vital Sign Frequency  Yellow: Q 2hr X 2 then Q 4hr X 2, if remains yellow, continue Q 4hrs  Escalate  MEWS: Escalate Yellow: discuss with charge nurse/RN and consider discussing with provider and RRT  Notify: Charge Nurse/RN  Name of Charge Nurse/RN Notified Hinton Dyer, RN  Date Charge Nurse/RN Notified 10/26/21  Time Charge Nurse/RN Notified 0048  Document  Patient Outcome Stabilized after interventions  Progress note created (see row info) Yes

## 2021-10-27 ENCOUNTER — Encounter (HOSPITAL_COMMUNITY): Payer: Self-pay | Admitting: General Surgery

## 2021-10-27 DIAGNOSIS — M869 Osteomyelitis, unspecified: Secondary | ICD-10-CM | POA: Diagnosis not present

## 2021-10-27 LAB — BASIC METABOLIC PANEL
Anion gap: 7 (ref 5–15)
BUN: 15 mg/dL (ref 6–20)
CO2: 24 mmol/L (ref 22–32)
Calcium: 7.9 mg/dL — ABNORMAL LOW (ref 8.9–10.3)
Chloride: 105 mmol/L (ref 98–111)
Creatinine, Ser: 0.76 mg/dL (ref 0.44–1.00)
GFR, Estimated: >60 mL/min (ref >60)
Glucose, Bld: 149 mg/dL — ABNORMAL HIGH (ref 70–99)
Potassium: 4 mmol/L (ref 3.5–5.1)
Sodium: 136 mmol/L (ref 135–145)

## 2021-10-27 LAB — CBC
HCT: 22.8 % — ABNORMAL LOW (ref 36.0–46.0)
Hemoglobin: 7.2 g/dL — ABNORMAL LOW (ref 12.0–15.0)
MCH: 26.7 pg (ref 26.0–34.0)
MCHC: 31.6 g/dL (ref 30.0–36.0)
MCV: 84.4 fL (ref 80.0–100.0)
Platelets: 332 10*3/uL (ref 150–400)
RBC: 2.7 MIL/uL — ABNORMAL LOW (ref 3.87–5.11)
RDW: 15.8 % — ABNORMAL HIGH (ref 11.5–15.5)
WBC: 11.2 10*3/uL — ABNORMAL HIGH (ref 4.0–10.5)
nRBC: 0 % (ref 0.0–0.2)

## 2021-10-27 LAB — HEMOGLOBIN AND HEMATOCRIT, BLOOD
HCT: 22.6 % — ABNORMAL LOW (ref 36.0–46.0)
Hemoglobin: 7 g/dL — ABNORMAL LOW (ref 12.0–15.0)

## 2021-10-27 LAB — GLUCOSE, CAPILLARY
Glucose-Capillary: 73 mg/dL (ref 70–99)
Glucose-Capillary: 117 mg/dL — ABNORMAL HIGH (ref 70–99)
Glucose-Capillary: 148 mg/dL — ABNORMAL HIGH (ref 70–99)
Glucose-Capillary: 166 mg/dL — ABNORMAL HIGH (ref 70–99)

## 2021-10-27 LAB — MAGNESIUM: Magnesium: 1.9 mg/dL (ref 1.7–2.4)

## 2021-10-27 LAB — PREPARE RBC (CROSSMATCH)

## 2021-10-27 MED ORDER — SODIUM CHLORIDE 0.9 % IV SOLN
250.0000 mg | Freq: Every day | INTRAVENOUS | Status: DC
  Administered 2021-10-27: 250 mg via INTRAVENOUS
  Filled 2021-10-27 (×2): qty 20

## 2021-10-27 MED ORDER — SODIUM CHLORIDE 0.9% IV SOLUTION: Freq: Once | INTRAVENOUS | Status: AC

## 2021-10-27 NOTE — Progress Notes (Signed)
Patient suffers from below knee amputation which impairs their ability to perform daily activities like ambulating in the home. A walker will not resolve issue with performing activities of daily living. A wheelchair will allow patient to safely perform daily activities. Patient can safely propel the wheelchair in the home or has a caregiver who can provide assistance.

## 2021-10-27 NOTE — Progress Notes (Signed)
PROGRESS NOTE    Anne [name]  EEF:[id number] DOB: 1981/11/08 DOA: 10/17/2021 PCP: Alfonse Flavors, MD   Brief Narrative:   40 year old with history of type 2 diabetes on insulin, peripheral vascular disease, hyperlipidemia, essential hypertension, previous transmetatarsal amputation right foot, chronic combined heart failure presented with 1 month of pain and swelling drainage and discharge from the left foot not relieved by outpatient therapies.  Consistently elevated blood sugars.  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  She will need SNF placement on discharge and was noted to have a fall on 12/3.  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  -Continue to follow CBGs and further adjust hypoglycemia regimen as needed -Continue Lyrica and symptomatic treatment of her gastroparesis.   History of coronary artery disease/ischemic cardiomyopathy: Patient on dual antiplatelet therapy with aspirin and [name], last dose of [name] on 11/25.  Holding [name] but continuing aspirin.   -Currently without chest pain. -She is also on nitrates, statin,  beta-blockers and ARB that is continued. -2D echocardiogram shows mostly chronic changes. Seen by cardiology, patient is at high risk for intervention but no need for further ischemic work-up prior to surgery -Resumed [name] 60 mg twice daily and follow-up with cardiology outpatient   Anemia-downtrending -Mostly anemia of chronic disease. Combination of hemodilution, anemia of chronic disease. -Noted previously to have low iron levels and will be given IV iron -2 units PRBCs previously given with another unit ordered, recheck CBC in a.m. -No signs of overt bleeding appreciated.     DVT prophylaxis: heparin injection 5,000 Units Start: 10/18/21 0600 SCDs Start: 10/18/21 0110   Code Status: Full code Family Communication: None Disposition Plan: Status is: Inpatient   Remains inpatient appropriate because: Significant foot infection and osteomyelitis needing IV antibiotics and inpatient surgical procedures.   Consultants:  General surgery Cardiology (for clearance)   Procedures:  None   Antimicrobials:  Vancomycin, cefepime and Flagyl 11/25>>12/3  Subjective: Patient seen and evaluated today with improvements in incisional pain noted, but she has not slept very well.  She was initially refusing SNF, but may reconsider.  Objective: Vitals:   10/26/21 0539 10/26/21 1510 10/26/21 2025 10/27/21 0627  BP: 132/83 123/72 (!) 145/98 (!) 145/96  Pulse: 93 (!) 106 (!) 114 (!) 107  Resp: 18 18 17 18   Temp: 99 F (37.2 C) 98.4 F (36.9 C) 98.1 F (36.7 C) 98.4 F (36.9 C)  TempSrc:  Oral Oral Oral  SpO2: 95% 100% 100% 99%  Weight:      Height:        Intake/Output Summary (Last 24 hours) at 10/27/2021 1142 Last data filed at 10/27/2021 1010 Gross per 24 hour  Intake 2184.4 ml  Output --  Net 2184.4 ml   Filed Weights   10/17/21 1841 10/18/21 0117  Weight: 76.2 kg 76 kg    Examination:  General exam: Appears calm and comfortable  Respiratory system: Clear to  auscultation. Respiratory effort normal. Cardiovascular system: S1 & S2 heard, RRR.  Gastrointestinal system: Abdomen is soft Central nervous system: Alert and awake Extremities: Left BKA dressings clean dry and intact Skin: No significant lesions noted Psychiatry: Flat affect.    Data Reviewed: I have personally reviewed following labs and imaging studies  CBC: Recent Labs  Lab 10/21/21 0530 10/22/21 0451 10/23/21 0458 10/24/21 0033 10/24/21 0505 10/25/21 0427 10/25/21 1946 10/26/21 0533 10/27/21 0434 10/27/21 1028  WBC 10.3 7.0 7.0   < > 8.4 10.0 12.4* 12.8* 11.2*  --   NEUTROABS 8.3* 5.2 4.8  --  5.3 7.9*  --   --   --   --   HGB 7.9* 7.3* 7.5*   < > 9.8* 8.3* 8.6* 8.2* 7.2* 7.0*  HCT 26.5* 24.8* 25.3*   < > 32.6* 27.7* 28.1* 26.5* 22.8* 22.6*  MCV 83.1 83.8 84.3   < > 85.1 85.2 85.2 85.2 84.4  --   PLT 399 342 366   < > 397 366 390 373 332  --    < > = values in this interval not displayed.   Basic Metabolic Panel: Recent Labs  Lab 10/24/21 0505 10/24/21 2217 10/25/21 0427 10/25/21 1946 10/26/21 0533 10/27/21 0434  NA 134*  --  133* 134* 135 136  K 4.4  --  4.1 5.1 4.2 4.0  CL 101  --  101 99 103 105  CO2 26  --  25 27 24 24   GLUCOSE 180* 443* 238* 207* 114* 149*  BUN 23*  --  18 21* 18 15  CREATININE 0.96  --  0.88 0.92 0.75 0.76  CALCIUM 8.4*  --  7.9* 8.3* 8.1* 7.9*  MG  --   --  1.8 1.7 1.7 1.9  PHOS  --   --   --  3.6  --   --    GFR: Estimated Creatinine Clearance: 96.7 mL/min (by C-G formula based on SCr of 0.76 mg/dL). Liver Function Tests: No results for input(s): AST, ALT, ALKPHOS, BILITOT, PROT, ALBUMIN in the last 168 hours. No results for input(s): LIPASE, AMYLASE in the last 168 hours. No results for input(s): AMMONIA in the last 168 hours. Coagulation Profile: No results for input(s): INR, PROTIME in the last 168 hours. Cardiac Enzymes: No results for input(s): CKTOTAL, CKMB, CKMBINDEX, TROPONINI in the last 168 hours. BNP (last 3  results) No results for input(s): PROBNP in the last 8760 hours. HbA1C: No results for input(s): HGBA1C in the last 72 hours. CBG: Recent Labs  Lab 10/26/21 1134 10/26/21 1643 10/26/21 2140 10/27/21 0811 10/27/21 1104  GLUCAP 281* 74 203* 73 117*   Lipid Profile: No results for input(s): CHOL, HDL, LDLCALC, TRIG, CHOLHDL, LDLDIRECT in the last 72 hours. Thyroid Function Tests: No results for input(s): TSH, T4TOTAL, FREET4, T3FREE, THYROIDAB in the last 72 hours. Anemia Panel: No results for input(s): VITAMINB12, FOLATE, FERRITIN, TIBC, IRON, RETICCTPCT in the last 72 hours. Sepsis Labs: No results for input(s): PROCALCITON, LATICACIDVEN in the last 168 hours.  Recent Results (from the past 240 hour(s))  Resp Panel by RT-PCR (Flu A&B, Covid) Nasopharyngeal Swab     Status: None   Collection Time: 10/17/21 11:10 PM   Specimen: Nasopharyngeal Swab; Nasopharyngeal(NP) swabs in vial transport medium  Result  Value Ref Range Status   SARS Coronavirus 2 by RT PCR NEGATIVE NEGATIVE Final    Comment: (NOTE) SARS-CoV-2 target nucleic acids are NOT DETECTED.  The SARS-CoV-2 RNA is generally detectable in upper respiratory specimens during the acute phase of infection. The lowest concentration of SARS-CoV-2 viral copies this assay can detect is 138 copies/mL. A negative result does not preclude SARS-Cov-2 infection and should not be used as the sole basis for treatment or other patient management decisions. A negative result may occur with  improper specimen collection/handling, submission of specimen other than nasopharyngeal swab, presence of viral mutation(s) within the areas targeted by this assay, and inadequate number of viral copies(<138 copies/mL). A negative result must be combined with clinical observations, patient history, and epidemiological information. The expected result is Negative.  Fact Sheet for Patients:  EntrepreneurPulse.com.au  Fact Sheet  for Healthcare Providers:  IncredibleEmployment.be  This test is no t yet approved or cleared by the [place] FDA and  has been authorized for detection and/or diagnosis of SARS-CoV-2 by FDA under an Emergency Use Authorization (EUA). This EUA will remain  in effect (meaning this test can be used) for the duration of the COVID-19 declaration under Section 564(b)(1) of the Act, 21 U.S.C.section 360bbb-3(b)(1), unless the authorization is terminated  or revoked sooner.       Influenza A by PCR NEGATIVE NEGATIVE Final   Influenza B by PCR NEGATIVE NEGATIVE Final    Comment: (NOTE) The Xpert Xpress SARS-CoV-2/FLU/RSV plus assay is intended as an aid in the diagnosis of influenza from Nasopharyngeal swab specimens and should not be used as a sole basis for treatment. Nasal washings and aspirates are unacceptable for Xpert Xpress SARS-CoV-2/FLU/RSV testing.  Fact Sheet for Patients: EntrepreneurPulse.com.au  Fact Sheet for Healthcare Providers: IncredibleEmployment.be  This test is not yet approved or cleared by the [place] FDA and has been authorized for detection and/or diagnosis of SARS-CoV-2 by FDA under an Emergency Use Authorization (EUA). This EUA will remain in effect (meaning this test can be used) for the duration of the COVID-19 declaration under Section 564(b)(1) of the Act, 21 U.S.C. section 360bbb-3(b)(1), unless the authorization is terminated or revoked.  Performed at [facility], [address] [place], [place] [address]   Surgical pcr screen     Status: None   Collection Time: 10/24/21  5:35 AM   Specimen: Nasal Mucosa; Nasal Swab  Result Value Ref Range Status   MRSA, PCR NEGATIVE NEGATIVE Final   Staphylococcus aureus NEGATIVE NEGATIVE Final    Comment: (NOTE) The Xpert SA Assay (FDA approved for NASAL specimens in patients 22 years of age and older), is one component of a  comprehensive surveillance program. It is not intended to diagnose infection nor to guide or monitor treatment. Performed at [facility], [address], [place], [place] [address]          Radiology Studies: No results found.      Scheduled Meds:  (feeding supplement) PROSource Plus  30 mL Oral BID BM   sodium chloride   Intravenous Once   vitamin C  500 mg Oral BID   aspirin EC  81 mg Oral Q breakfast   atorvastatin  80 mg Oral QPM   carvedilol  3.125 mg Oral BID   ezetimibe  10 mg Oral Daily   fenofibrate  54 mg Oral Daily   heparin  5,000 Units Subcutaneous Q8H   insulin aspart  0-15 Units Subcutaneous TID WC   insulin aspart  0-5 Units Subcutaneous QHS   insulin aspart  15 Units Subcutaneous TID WC   insulin detemir  30 Units Subcutaneous QHS   isosorbide mononitrate  30 mg Oral Daily   losartan  25 mg Oral QPM   metaxalone  400 mg Oral QID   multivitamin with minerals  1 tablet Oral Daily   nutrition supplement (JUVEN)  1 packet Oral BID BM   phosphorus  500 mg Oral TID   pregabalin  75 mg Oral Daily   Ensure Max Protein  11 oz Oral BID   sertraline  200 mg Oral QHS   ticagrelor  60 mg Oral BID   zinc sulfate  220 mg Oral Daily   Continuous Infusions:  sodium chloride 100 mL/hr at 10/27/21 0402   ferric gluconate (FERRLECIT) IVPB     lactated ringers 10 mL/hr at 10/27/21 1010   promethazine (PHENERGAN) injection (IM or IVPB) 200 mL/hr at 10/25/21 1310     LOS: 9 days    Time spent: 35 minutes    Jmarion Christiano Darleen Crocker, DO [facility]  If 7PM-7AM, please contact night-coverage [email or web address] 10/27/2021, 11:42 AM

## 2021-10-27 NOTE — TOC Progression Note (Signed)
Transition of Care [facility]) - Progression Note    Patient Details  Name: Anne [name] MRN: [id number] Date of Birth: 25-Apr-1981  [facility] CM/SW Contact  Salome Arnt, [place] Phone Number: 10/27/2021, 1:41 PM  Clinical Narrative:  Per MD, pt now considering SNF. LCSW discussed with pt again and she still isn't sure, but agreed for [facility] to send out referral to [facility]. She plans to discuss with family more tonight. TOC will follow up in AM for decision.     Expected Discharge Plan: [place] Barriers to Discharge: Continued Medical Work up  Expected Discharge Plan and Services Expected Discharge Plan: [place]   Discharge Planning Services: CM Consult   Living arrangements for the past 2 months: Single Family Home                           HH Arranged: RN, PT [facility], [place] Agency: [place] ([place]) Date [place]: 10/25/21 Time [place]: 0100 Representative spoke with at [place]: [place] ([place]) Interventions    Readmission Risk Interventions Readmission Risk Prevention Plan 10/26/2021 10/24/2021  Transportation Screening Complete -  [place] or [place] Complete Complete  Social Work Consult for [place] Planning/Counseling Complete Complete  Palliative Care Screening Not Applicable Not Applicable  Medication Review Press photographer) Complete Complete  Some recent data might be hidden

## 2021-10-27 NOTE — Progress Notes (Addendum)
Nutrition- Follow up   DOCUMENTATION CODES:   Not applicable  INTERVENTION:  Ensure MAX BID   ProSource 30 ml BID (each 30 ml provides 100 kcal, 15 gr protein)   -1 packet Juven BID, to support wound healing   NUTRITION DIAGNOSIS:   Increased nutrient needs related to acute illness, wound healing as evidenced by estimated needs.  -ongoing  GOAL:   Patient will meet greater than or equal to 90% of their needs  -progressing  MONITOR:   PO intake, Supplement acceptance, Labs, Weight trends   ASSESSMENT:   40 year old with female with medical history of type 2 DM on insulin, PVD, HLD, HTN, previous transmetatarsal amputation on R foot, acid reflux, arthritis, CKD, gastroparesis, and CHF. She presented to the ED due to 1 month of pain, swelling, and discharge/drainage from L foot not relieved by outpatient therapies. She also reported persistent N/V and consistently elevated CBGs.  Patient is s/p left BKA on 12/2. She is working with therapy and is considering SNF at discharge. Estimated nutrition needs reassessed.   Patient reports to be eating better overall but review of intake looks poor 0-50% of most meals. She likes the Ensure Max and was encouraged to continue with the ONS due to her increased protein needs. Will increase ONS due to limited oral intake. Patient sitting up on the edge of bed during RD visit.   Medications reviewed.   Labs: BMP Latest Ref Rng & Units 10/27/2021 10/26/2021 10/25/2021  Glucose 70 - 99 mg/dL 149(H) 114(H) 207(H)  BUN 6 - 20 mg/dL 15 18 21(H)  Creatinine 0.44 - 1.00 mg/dL 0.76 0.75 0.92  BUN/Creat Ratio 9 - 23 - - -  Sodium 135 - 145 mmol/L 136 135 134(L)  Potassium 3.5 - 5.1 mmol/L 4.0 4.2 5.1  Chloride 98 - 111 mmol/L 105 103 99  CO2 22 - 32 mmol/L 24 24 27   Calcium 8.9 - 10.3 mg/dL 7.9(L) 8.1(L) 8.3(L)     Diet Order:   Diet Order             Diet Carb Modified Fluid consistency: Thin; Room service appropriate? Yes  Diet effective  now                   EDUCATION NEEDS:   Not appropriate for education at this time  Skin:  Skin Assessment: Skin Integrity Issues: Skin Integrity Issues:: Other (Comment) Other: L heel redness and swelling  Last BM:  PTA/unknown  Height:   Ht Readings from Last 1 Encounters:  10/18/21 5\' 9"  (1.753 m)    Weight:   Wt Readings from Last 1 Encounters:  10/18/21 76 kg    BMI:  Body mass index is 24.74 kg/m.  Estimated Nutritional Needs:   Kcal:  2000-2100 kcal  Protein:  109-115 gr  Fluid:  >/= 2.2 L/day  Colman Cater MS,RD,CSG,LDN Contact: AMION

## 2021-10-27 NOTE — Plan of Care (Signed)

## 2021-10-27 NOTE — Progress Notes (Signed)
3 Days Post-Op  Subjective: Patient with decreased pain around the left BKA site.    Objective: Vital signs in last 24 hours: Temp:  [98.1 F (36.7 C)-98.4 F (36.9 C)] 98.4 F (36.9 C) (12/05 0627) Pulse Rate:  [106-114] 107 (12/05 0627) Resp:  [17-18] 18 (12/05 0627) BP: (123-145)/(72-98) 145/96 (12/05 0627) SpO2:  [99 %-100 %] 99 % (12/05 0627) Last BM Date: 10/25/21  Intake/Output from previous day: 12/04 0701 - 12/05 0700 In: 2581.5 [P.O.:480; I.V.:2101.5] Out: -  Intake/Output this shift: Total I/O In: 82.9 [I.V.:82.9] Out: -   General appearance: alert, cooperative, and no distress Extremities: Left BKA site healing well without hematoma or drainage.  Lab Results:  Recent Labs    10/26/21 0533 10/27/21 0434 10/27/21 1028  WBC 12.8* 11.2*  --   HGB 8.2* 7.2* 7.0*  HCT 26.5* 22.8* 22.6*  PLT 373 332  --    BMET Recent Labs    10/26/21 0533 10/27/21 0434  NA 135 136  K 4.2 4.0  CL 103 105  CO2 24 24  GLUCOSE 114* 149*  BUN 18 15  CREATININE 0.75 0.76  CALCIUM 8.1* 7.9*   PT/INR No results for input(s): LABPROT, INR in the last 72 hours.  Studies/Results: No results found.  Anti-infectives: Anti-infectives (From admission, onward)    Start     Dose/Rate Route Frequency Ordered Stop   10/24/21 2200  metroNIDAZOLE (FLAGYL) tablet 500 mg  Status:  Discontinued        500 mg Oral Every 12 hours 10/24/21 1244 10/25/21 1130   10/20/21 1400  vancomycin (VANCOREADY) IVPB 1000 mg/200 mL  Status:  Discontinued        1,000 mg 200 mL/hr over 60 Minutes Intravenous Every 12 hours 10/20/21 1100 10/25/21 1130   10/18/21 0015  metroNIDAZOLE (FLAGYL) tablet 500 mg        500 mg Oral Every 12 hours 10/18/21 0003 10/23/21 2107   10/18/21 0015  vancomycin (VANCOREADY) IVPB 1500 mg/300 mL  Status:  Discontinued        1,500 mg 150 mL/hr over 120 Minutes Intravenous Daily at bedtime 10/18/21 0010 10/20/21 1100   10/18/21 0015  ceFEPIme (MAXIPIME) 2 g in sodium  chloride 0.9 % 100 mL IVPB        2 g 200 mL/hr over 30 Minutes Intravenous Every 8 hours 10/18/21 0010 10/25/21 0014       Assessment/Plan: s/p Procedure(s): AMPUTATION BELOW KNEE Impression: Postoperative day 3.  Patient was resistant to going to a SNF for rehab.  I did try to encourage her to consider this strongly as she will need extensive physical therapy to keep her knee extended.  She also needs assistance with instruction on transfers.  She will consider this.  She is stable for discharge from surgery standpoint.  She is receiving 1 unit of packed red blood cells for chronic anemia.  LOS: 9 days    Aviva Signs 10/27/2021

## 2021-10-27 NOTE — Evaluation (Signed)
Physical Therapy Evaluation Patient Details Name: Anne [name] MRN: [id number] DOB: May 04, 1981 Today's Date: 10/27/2021  History of Present Illness  Anne [name] is a 40 y/o female, s/p Left BKA on 10/24/21  with the diagnosis of osteomyelitis/cellulitis left foot  Clinical Impression  Patient very unsteady on feet with frequent sliding of fight forward even when wearing non-skid sock, limited to a couple os shuffling steps on right foot before losing balance and having to sit in chair to avoid fall, had patient put on post op TMA shoe on right foot, but unable to step beyond bedside due to generalized weakness and fall risk.  Patient tolerated sitting up in chair after therapy - nursing staff notified.  Patient will benefit from continued skilled physical therapy in hospital and recommended venue below to increase strength, balance, endurance for safe ADLs and gait.         Recommendations for follow up therapy are one component of a multi-disciplinary discharge planning process, led by the attending physician.  Recommendations may be updated based on patient status, additional functional criteria and insurance authorization.  Follow Up Recommendations Skilled nursing-short term rehab (<3 hours/day)    Assistance Recommended at Discharge PRN  Functional Status Assessment Patient has had a recent decline in their functional status and demonstrates the ability to make significant improvements in function in a reasonable and predictable amount of time.  Equipment Recommendations  None recommended by PT    Recommendations for Other Services       Precautions / Restrictions Precautions Precautions: Fall Required Braces or Orthoses:  (TMA shoe for right foot) Restrictions Weight Bearing Restrictions: Yes LLE Weight Bearing: Non weight bearing      Mobility  Bed Mobility Overal bed mobility: Needs Assistance Bed Mobility: Supine to Sit     Supine to sit: Min guard;Min  assist     General bed mobility comments: increased time, labored movement    Transfers Overall transfer level: Needs assistance Equipment used: Rolling walker (2 wheels) Transfers: Sit to/from Stand;Bed to chair/wheelchair/BSC Sit to Stand: Mod assist;Max assist   Step pivot transfers: Mod assist;Max assist       General transfer comment: frequent sliding of right foot forward when attempting wearing non-skid socks, had to use her post op TMA shoe on right foot    Ambulation/Gait Ambulation/Gait assistance: Max assist Gait Distance (Feet): 2 Feet Assistive device: Rollator (4 wheels) Gait Pattern/deviations: Decreased stance time - right;Decreased step length - right;Decreased stride length;Shuffle Gait velocity: decreased     General Gait Details: limited to a couple of shuffling steps on RLE with frequent sliding forward with near fall  Stairs            Wheelchair Mobility    Modified Rankin (Stroke Patients Only)       Balance Overall balance assessment: Needs assistance Sitting-balance support: Feet supported;No upper extremity supported Sitting balance-Leahy Scale: Fair Sitting balance - Comments: fair/good seated at EOB   Standing balance support: Reliant on assistive device for balance;During functional activity;Bilateral upper extremity supported Standing balance-Leahy Scale: Poor Standing balance comment: using RW while wearing non-skid sock on right foot                             Pertinent Vitals/Pain Pain Assessment: 0-10 Pain Score: 9  Pain Location: left stump and phantom pain to LLE Pain Descriptors / Indicators: Sore Pain Intervention(s): Limited activity within patient's tolerance;Monitored during session;Repositioned  Home Living Family/patient expects to be discharged to:: Private residence Living Arrangements: Spouse/significant other Available Help at Discharge: Family;Available 24 hours/day Type of Home: Mobile  home Home Access: Ramped entrance       Home Layout: One level Home Equipment: Advice worker (2 wheels);Wheelchair - manual      Prior Function Prior Level of Function : Independent/Modified Independent             Mobility Comments: household and short distanced Hydrographic surveyor using RW ADLs Comments: assisted by family     Hand Dominance        Extremity/Trunk Assessment   Upper Extremity Assessment Upper Extremity Assessment: Defer to OT evaluation    Lower Extremity Assessment Lower Extremity Assessment: Generalized weakness    Cervical / Trunk Assessment Cervical / Trunk Assessment: Normal  Communication   Communication: No difficulties  Cognition Arousal/Alertness: Awake/alert Behavior During Therapy: WFL for tasks assessed/performed Overall Cognitive Status: Within Functional Limits for tasks assessed                                          General Comments      Exercises     Assessment/Plan    PT Assessment Patient needs continued PT services  PT Problem List Decreased strength;Decreased activity tolerance;Decreased balance;Decreased mobility       PT Treatment Interventions DME instruction;Gait training;Functional mobility training;Therapeutic activities;Therapeutic exercise;Balance training;Patient/family education    PT Goals (Current goals can be found in the Care Plan section)  Acute Rehab PT Goals Patient Stated Goal: return home after rehab PT Goal Formulation: With patient Time For Goal Achievement: 11/11/21 Potential to Achieve Goals: Good    Frequency Min 3X/week   Barriers to discharge        Co-evaluation               AM-PAC PT "6 Clicks" Mobility  Outcome Measure Help needed turning from your back to your side while in a flat bed without using bedrails?: A Little Help needed moving from lying on your back to sitting on the side of a flat bed without using bedrails?: A  Little Help needed moving to and from a bed to a chair (including a wheelchair)?: A Lot Help needed standing up from a chair using your arms (e.g., wheelchair or bedside chair)?: A Lot Help needed to walk in hospital room?: A Lot Help needed climbing 3-5 steps with a railing? : Total 6 Click Score: 13    End of Session   Activity Tolerance: Patient tolerated treatment well;Patient limited by fatigue;Patient limited by pain Patient left: in chair;with call bell/phone within reach Nurse Communication: Mobility status PT Visit Diagnosis: Unsteadiness on feet (R26.81);Other abnormalities of gait and mobility (R26.89);Muscle weakness (generalized) (M62.81)    Time: 6333-5456 PT Time Calculation (min) (ACUTE ONLY): 22 min   Charges:   PT Evaluation $PT Eval Moderate Complexity: 1 Mod PT Treatments $Therapeutic Activity: 8-22 mins        3:01 PM, 10/27/21 Lonell Grandchild, MPT Physical Therapist with [facility] [phone] office [phone] mobile phone

## 2021-10-27 NOTE — TOC Progression Note (Signed)
Transition of Care [facility]) - Progression Note    Patient Details  Name: Anne [name] MRN: [id number] Date of Birth: January 25, 1981  Transition of Care [facility]) CM/SW Contact  Salome Arnt, [place] Phone Number: 10/27/2021, 10:41 AM  Clinical Narrative:  Discussed PT recommendation for SNF with pt. Pt states she wants to return home with home health. She lives with several adults and someone will be with her around the clock. LCSW notified Vaughan Basta with [facility] that pt will need PT, OT, RN, and SW. Pt requesting wheelchair. Pt agreeable to refer to [facility]. Caryl Pina notified and will deliver to room. TOC will continue to follow.       Expected Discharge Plan: [place] Barriers to Discharge: Continued Medical Work up  Expected Discharge Plan and Services Expected Discharge Plan: [place]   Discharge Planning Services: CM Consult   Living arrangements for the past 2 months: Single Family Home                           HH Arranged: RN, PT [facility] Agency: [place] ([facility]) Date [place]: 10/25/21 Time [place]: 0100 Representative spoke with at [place]: [place] ([place]) Interventions    Readmission Risk Interventions Readmission Risk Prevention Plan 10/26/2021 10/24/2021  Transportation Screening Complete -  [place] or [place] Complete Complete  Social Work Consult for [place] Agency Planning/Counseling Complete Complete  Palliative Care Screening Not Applicable Not Applicable  Medication Review Press photographer) Complete Complete  Some recent data might be hidden

## 2021-10-27 NOTE — NC FL2 (Signed)
[place] LEVEL OF CARE SCREENING TOOL     IDENTIFICATION  Patient Name: Anne [name] Birthdate: 1981-05-30 Sex: female Admission Date (Current Location): 10/17/2021  [place] and [place] Number:  [place] [id number] [place] and Address:  [place] [address] [place]      Provider Number: [id number]  Attending Physician Name and Address:  Rodena Goldmann, DO  Relative Name and Phone Number:       Current Level of Care: Hospital Recommended Level of Care: [place] Prior Approval Number:    Date Approved/Denied:   PASRR Number: [id number]  Discharge Plan: SNF    Current Diagnoses: Patient Active Problem List   Diagnosis Date Noted   Preoperative cardiovascular examination    Osteomyelitis of second toe of left foot (HCC)    Intractable nausea and vomiting 03/09/2020   ACS (acute coronary syndrome) ([place]) 03/08/2020   Hypercholesteremia    Chest pain 66/29/4765   Chronic systolic CHF (congestive heart failure) ([place]) 11/28/2018   S/P PTCA (percutaneous transluminal coronary angioplasty)    CAD (coronary artery disease) 11/13/2018   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[place])  11/13/2018   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 11/11/2018   Osteomyelitis ([place]) 07/01/2018   Personal history of noncompliance with medical treatment, presenting hazards to health 10/28/2015   Multiple nevi 10/07/2012   Toe ulcer ([place]) 03/11/2012   Essential hypertension 03/11/2012   Type 2 diabetes mellitus with vascular disease ([place]) 02/11/2012   Diabetic neuropathy ([place]) 02/11/2012   Back pain 02/11/2012   Muscle spasm 02/11/2012   Hyperlipidemia associated with type 2 diabetes mellitus ([place]) 02/11/2012   Obesity 02/11/2012   Cellulitis in diabetic foot ([place]) 10/31/2011    Orientation RESPIRATION BLADDER Height & Weight     Self, Time, Situation, Place  Normal  Continent Weight: 167 lb 8.8 oz (76 kg) Height:  5\' 9"  (175.3 cm)  BEHAVIORAL SYMPTOMS/MOOD NEUROLOGICAL BOWEL NUTRITION STATUS      Continent Diet (Carb modified. See d/c summary for updates.)  AMBULATORY STATUS COMMUNICATION OF NEEDS Skin   Total Care Verbally Surgical wounds, Other (Comment) (Contact dermatitis to sacrum. Excoriated abdomen.)                       Personal Care Assistance Level of Assistance  Bathing, Feeding, Dressing Bathing Assistance: Maximum assistance Feeding assistance: Limited assistance Dressing Assistance: Maximum assistance     Functional Limitations Info  Sight, Hearing, Speech Sight Info: Impaired Hearing Info: Adequate Speech Info: Adequate    SPECIAL CARE FACTORS FREQUENCY  PT (By licensed PT)     PT Frequency: 5x weekly              Contractures      Additional Factors Info  Code Status, Allergies, Isolation Precautions, Insulin Sliding Scale, Psychotropic Code Status Info: Full code Allergies Info: Canagliflozin, Nsaids Psychotropic Info: Zoloft Insulin Sliding Scale Info: See d/c summary Isolation Precautions Info: +MRSA PCR 11/11/18     Current Medications (10/27/2021):  This is the current hospital active medication list Current Facility-Administered Medications  Medication Dose Route Frequency Provider Last Rate Last Admin   (feeding supplement) PROSource Plus liquid 30 mL  30 mL Oral BID BM Aviva Signs, MD   30 mL at 10/26/21 2053   0.9 %  sodium chloride infusion (Manually program via Guardrails IV Fluids)   Intravenous Once Manuella Ghazi, Pratik D, DO  0.9 %  sodium chloride infusion   Intravenous Continuous Aviva Signs, MD 100 mL/hr at 10/27/21 0402 New Bag at 10/27/21 0402   acetaminophen (TYLENOL) tablet 650 mg  650 mg Oral Q6H PRN Aviva Signs, MD   650 mg at 10/25/21 2252   Or   acetaminophen (TYLENOL) suppository 650 mg  650 mg Rectal Q6H PRN Aviva Signs, MD       albuterol (PROVENTIL) (2.5 MG/3ML) 0.083%  nebulizer solution 2.5 mg  2.5 mg Nebulization Q6H PRN Aviva Signs, MD       ascorbic acid (VITAMIN C) tablet 500 mg  500 mg Oral BID Aviva Signs, MD   500 mg at 10/27/21 2458   aspirin EC tablet 81 mg  81 mg Oral Q breakfast Aviva Signs, MD   81 mg at 10/27/21 0998   atorvastatin (LIPITOR) tablet 80 mg  80 mg Oral QPM Aviva Signs, MD   80 mg at 10/26/21 1719   carvedilol (COREG) tablet 3.125 mg  3.125 mg Oral BID Aviva Signs, MD   3.125 mg at 10/27/21 3382   cyclobenzaprine (FLEXERIL) tablet 10 mg  10 mg Oral TID PRN Aviva Signs, MD   10 mg at 10/27/21 0134   ezetimibe (ZETIA) tablet 10 mg  10 mg Oral Daily Aviva Signs, MD   10 mg at 10/27/21 5053   fenofibrate tablet 54 mg  54 mg Oral Daily Aviva Signs, MD   54 mg at 10/27/21 9767   ferric gluconate (FERRLECIT) 250 mg in sodium chloride 0.9 % 250 mL IVPB  250 mg Intravenous Daily Heath Lark D, DO 135 mL/hr at 10/27/21 1152 250 mg at 10/27/21 1152   heparin injection 5,000 Units  5,000 Units Subcutaneous Q8H Aviva Signs, MD   5,000 Units at 10/27/21 3419   HYDROmorphone (DILAUDID) injection 2 mg  2 mg Intravenous Q3H PRN Aviva Signs, MD   2 mg at 10/27/21 1257   insulin aspart (novoLOG) injection 0-15 Units  0-15 Units Subcutaneous TID WC Shah, Pratik D, DO   8 Units at 10/26/21 1306   insulin aspart (novoLOG) injection 0-5 Units  0-5 Units Subcutaneous QHS Heath Lark D, DO   2 Units at 10/26/21 2238   insulin aspart (novoLOG) injection 15 Units  15 Units Subcutaneous TID WC Aviva Signs, MD   15 Units at 10/25/21 0915   insulin detemir (LEVEMIR) injection 30 Units  30 Units Subcutaneous QHS Heath Lark D, DO   30 Units at 10/26/21 2223   isosorbide mononitrate (IMDUR) 24 hr tablet 30 mg  30 mg Oral Daily Aviva Signs, MD   30 mg at 10/27/21 3790   lactated ringers infusion   Intravenous Continuous Louann Sjogren, MD 10 mL/hr at 10/27/21 1010 Infusion Verify at 10/27/21 1010   LORazepam (ATIVAN) injection 1 mg  1 mg  Intravenous Q4H PRN Aviva Signs, MD   1 mg at 10/26/21 1433   losartan (COZAAR) tablet 25 mg  25 mg Oral QPM Aviva Signs, MD   25 mg at 10/26/21 1721   metaxalone (SKELAXIN) tablet 400 mg  400 mg Oral QID Aviva Signs, MD   400 mg at 10/27/21 2409   multivitamin with minerals tablet 1 tablet  1 tablet Oral Daily Aviva Signs, MD   1 tablet at 10/27/21 7353   nutrition supplement (JUVEN) (JUVEN) powder packet 1 packet  1 packet Oral BID BM Aviva Signs, MD   1 packet at 10/27/21 [phone]   oxyCODONE (Oxy IR/ROXICODONE) immediate release tablet 5  mg  5 mg Oral Q4H PRN Aviva Signs, MD   5 mg at 10/27/21 1152   phosphorus (K PHOS NEUTRAL) tablet 500 mg  500 mg Oral TID Aviva Signs, MD   500 mg at 10/27/21 0838   pregabalin (LYRICA) capsule 75 mg  75 mg Oral Daily Aviva Signs, MD   75 mg at 10/27/21 4827   promethazine (PHENERGAN) 12.5 mg in sodium chloride 0.9 % 50 mL IVPB  12.5 mg Intravenous Q6H PRN Aviva Signs, MD 200 mL/hr at 10/25/21 1310 Restarted at 10/25/21 1310   protein supplement (ENSURE MAX) liquid  11 oz Oral BID Aviva Signs, MD   11 oz at 10/26/21 1725   sertraline (ZOLOFT) tablet 200 mg  200 mg Oral QHS Aviva Signs, MD   200 mg at 10/26/21 2223   ticagrelor ([name]) tablet 60 mg  60 mg Oral BID Heath Lark D, DO   60 mg at 10/27/21 0786   zinc sulfate capsule 220 mg  220 mg Oral Daily Aviva Signs, MD   220 mg at 10/27/21 7544     Discharge Medications: Please see discharge summary for a list of discharge medications.  Relevant Imaging Results:  Relevant Lab Results:   Additional Information SSN: [id number]. Pt has not received COVID vaccinations.  Salome Arnt, LCSW

## 2021-10-27 NOTE — Plan of Care (Signed)
  Problem: Acute Rehab PT Goals(only PT should resolve) Goal: Pt Will Go Supine/Side To Sit Outcome: Progressing Flowsheets (Taken 10/27/2021 1506) Pt will go Supine/Side to Sit:  with modified independence  with supervision Goal: Patient Will Transfer Sit To/From Stand Outcome: Progressing Flowsheets (Taken 10/27/2021 1506) Patient will transfer sit to/from stand:  with minimal assist  with moderate assist Goal: Pt Will Transfer Bed To Chair/Chair To Bed Outcome: Progressing Flowsheets (Taken 10/27/2021 1506) Pt will Transfer Bed to Chair/Chair to Bed:  with min assist  with mod assist Goal: Pt Will Ambulate Outcome: Progressing Flowsheets (Taken 10/27/2021 1506) Pt will Ambulate:  10 feet  with moderate assist  with rolling walker   3:06 PM, 10/27/21 Lonell Grandchild, MPT Physical Therapist with [facility] [phone] office [phone] mobile phone

## 2021-10-28 DIAGNOSIS — M869 Osteomyelitis, unspecified: Secondary | ICD-10-CM | POA: Diagnosis not present

## 2021-10-28 LAB — BASIC METABOLIC PANEL
Anion gap: 8 (ref 5–15)
BUN: 12 mg/dL (ref 6–20)
CO2: 26 mmol/L (ref 22–32)
Calcium: 8.6 mg/dL — ABNORMAL LOW (ref 8.9–10.3)
Chloride: 105 mmol/L (ref 98–111)
Creatinine, Ser: 0.85 mg/dL (ref 0.44–1.00)
GFR, Estimated: >60 mL/min (ref >60)
Glucose, Bld: 162 mg/dL — ABNORMAL HIGH (ref 70–99)
Potassium: 4.5 mmol/L (ref 3.5–5.1)
Sodium: 139 mmol/L (ref 135–145)

## 2021-10-28 LAB — CBC
HCT: 28.3 % — ABNORMAL LOW (ref 36.0–46.0)
Hemoglobin: 8.8 g/dL — ABNORMAL LOW (ref 12.0–15.0)
MCH: 26.7 pg (ref 26.0–34.0)
MCHC: 31.1 g/dL (ref 30.0–36.0)
MCV: 85.8 fL (ref 80.0–100.0)
Platelets: 355 10*3/uL (ref 150–400)
RBC: 3.3 MIL/uL — ABNORMAL LOW (ref 3.87–5.11)
RDW: 15.6 % — ABNORMAL HIGH (ref 11.5–15.5)
WBC: 10.6 10*3/uL — ABNORMAL HIGH (ref 4.0–10.5)
nRBC: 0 % (ref 0.0–0.2)

## 2021-10-28 LAB — TYPE AND SCREEN
ABO/RH(D): A POS
Antibody Screen: NEGATIVE
Unit division: 0

## 2021-10-28 LAB — GLUCOSE, CAPILLARY
Glucose-Capillary: 163 mg/dL — ABNORMAL HIGH (ref 70–99)
Glucose-Capillary: 134 mg/dL — ABNORMAL HIGH (ref 70–99)
Glucose-Capillary: 205 mg/dL — ABNORMAL HIGH (ref 70–99)

## 2021-10-28 LAB — SURGICAL PATHOLOGY

## 2021-10-28 LAB — BPAM RBC
Blood Product Expiration Date: 202212242359
ISSUE DATE / TIME: 202212051512
Unit Type and Rh: 6200

## 2021-10-28 LAB — MAGNESIUM: Magnesium: 2 mg/dL (ref 1.7–2.4)

## 2021-10-28 MED ORDER — PREGABALIN 75 MG PO CAPS: 75.0000 mg | ORAL_CAPSULE | Freq: Every day | ORAL | 0 refills | Status: AC

## 2021-10-28 MED ORDER — OXYCODONE HCL 5 MG PO TABS: 5.0000 mg | ORAL_TABLET | ORAL | 0 refills | Status: DC | PRN

## 2021-10-28 MED ORDER — FERROUS SULFATE 325 (65 FE) MG PO TABS
325.0000 mg | ORAL_TABLET | Freq: Every day | ORAL | 3 refills | Status: AC
Start: 2021-10-28 — End: 2022-10-28

## 2021-10-28 MED ORDER — ENSURE MAX PROTEIN PO LIQD: 11.0000 [oz_av] | Freq: Two times a day (BID) | ORAL | 0 refills | Status: AC

## 2021-10-28 MED ORDER — TICAGRELOR 60 MG PO TABS: 60.0000 mg | ORAL_TABLET | Freq: Two times a day (BID) | ORAL | 0 refills | Status: DC

## 2021-10-28 MED ORDER — METAXALONE 400 MG PO TABS: 400.0000 mg | ORAL_TABLET | Freq: Four times a day (QID) | ORAL | 0 refills | Status: DC

## 2021-10-28 NOTE — Evaluation (Signed)
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 Pt able to complete bed mobility without physical assist this date with [facility] elevated for supine to sit. Pt able to complete step pivot transfer with RW and moderate assistance. Pt also using TMA boot on R foot to prevent slipping. Pt unsteady balancing on R LE. Pt demonstrates good B UE strength and ability to complete lower body dressing seated without physical assist. Pt will benefit from continued OT in the hospital and recommended venue below to increase strength, balance, and endurance for safe ADL's.        Recommendations for follow up therapy are one component of a multi-disciplinary discharge planning process, led by the attending physician.  Recommendations may be updated based on patient status, additional functional criteria and insurance authorization.   Follow Up Recommendations  Skilled nursing-short term rehab (<3 hours/day)    Assistance Recommended at Discharge Intermittent Supervision/Assistance  Functional Status Assessment  Patient has had a recent decline in their functional status and demonstrates the ability to make significant improvements in function in a reasonable and predictable amount of time.  Equipment Recommendations  None recommended by OT    Recommendations for Other Services       Precautions / Restrictions Precautions Precautions: Fall Precaution Comments: R TMA shoe Restrictions Weight Bearing Restrictions: Yes LLE Weight Bearing: Non weight bearing      Mobility Bed Mobility Overal bed mobility: Needs Assistance Bed Mobility: Supine to Sit;Sit to Supine     Supine to sit: Supervision;Modified independent  (Device/Increase time);HOB elevated Sit to supine: Supervision;Modified independent (Device/Increase time)   General bed mobility comments: HOB slightly elvated. Pt completing without need for physical assist.    Transfers Overall transfer level: Needs assistance Equipment used: Rolling walker (2 wheels) Transfers: Sit to/from Stand;Bed to chair/wheelchair/BSC Sit to Stand: Mod assist     Step pivot transfers: Mod assist     General transfer comment: TMA shoe used on R foot. Pt demonstrates unsteadiness using RW with need for physical assist and cuing.      Balance Overall balance assessment: Needs assistance Sitting-balance support: Feet supported;No upper extremity supported Sitting balance-Leahy Scale: Good Sitting balance - Comments: good seated at EOB   Standing balance support: Reliant on assistive device for balance;During functional activity;Bilateral upper extremity supported Standing balance-Leahy Scale: Poor Standing balance comment: using RW while wearing TMA shoe on R foot.                           ADL either performed or assessed with clinical judgement   ADL Overall ADL's : Needs assistance/impaired                     Lower Body Dressing: Supervision/safety;Modified independent;Sitting/lateral leans Lower Body Dressing Details (indicate cue type and reason): Pt able to don and doff TMA shoe without physical assist. Toilet Transfer: Moderate assistance;Stand-pivot;Rolling walker (2 wheels) Toilet Transfer Details (indicate cue type and reason): Simulated via EOB to chair and back with RW.                 Vision Baseline Vision/History: 2 Legally blind Ability to See in Adequate Light: 3 Highly impaired Patient  Visual Report: No change from baseline Vision Assessment?:  (Legally blind in R eye and L eye only sees shadows and shapes.)                Pertinent Vitals/Pain Pain Assessment: 0-10 Pain Score: 6  Pain Location: L  stump Pain Descriptors / Indicators: Crushing;Throbbing Pain Intervention(s): Limited activity within patient's tolerance;Monitored during session;Repositioned     Hand Dominance Right   Extremity/Trunk Assessment Upper Extremity Assessment Upper Extremity Assessment: Overall WFL for tasks assessed   Lower Extremity Assessment Lower Extremity Assessment: Defer to PT evaluation   Cervical / Trunk Assessment Cervical / Trunk Assessment: Normal   Communication Communication Communication: No difficulties   Cognition Arousal/Alertness: Awake/alert Behavior During Therapy: WFL for tasks assessed/performed Overall Cognitive Status: Within Functional Limits for tasks assessed                                                  Home Living Family/patient expects to be discharged to:: Private residence Living Arrangements: Spouse/significant other Available Help at Discharge: Family;Available 24 hours/day Type of Home: Mobile home Home Access: Ramped entrance     Home Layout: One level     Bathroom Shower/Tub: Occupational psychologist: Standard     Home Equipment: Advice worker (2 wheels);Wheelchair - manual          Prior Functioning/Environment Prior Level of Function : Independent/Modified Independent             Mobility Comments: household and short distanced community ambulator using RW ADLs Comments: assisted by family for dressing, independent for bathing and toileting. Assists at times for functional transfers.        OT Problem List: Decreased activity tolerance;Impaired balance (sitting and/or standing)      OT Treatment/Interventions: Self-care/ADL training;Therapeutic exercise;Therapeutic activities;Patient/family education;Balance training    OT Goals(Current goals can be found in the care plan section) Acute Rehab OT Goals Patient Stated Goal: return home OT Goal Formulation: With patient Time For Goal  Achievement: 11/11/21 Potential to Achieve Goals: Good  OT Frequency: Min 2X/week                     End of Session Equipment Utilized During Treatment: Rolling walker (2 wheels)  Activity Tolerance: Patient tolerated treatment well Patient left: in bed;with call bell/phone within reach  OT Visit Diagnosis: Unsteadiness on feet (R26.81);Muscle weakness (generalized) (M62.81)                Time: 7672-0947 OT Time Calculation (min): 15 min Charges:  OT General Charges $OT Visit: 1 Visit OT Evaluation $OT Eval Low Complexity: 1 Low  Shaelee Forni OT, MOT  Larey Seat 10/28/2021, 12:29 PM

## 2021-10-28 NOTE — Progress Notes (Signed)
Patient is strongly considering SNF placement.  I strongly encouraged her to go this route.  She needs intensive physical therapy to assist with transfers.  Incisional pain seems under better control.

## 2021-10-28 NOTE — Plan of Care (Signed)
  Problem: Acute Rehab OT Goals (only OT should resolve) Goal: Pt. Will Perform Grooming Flowsheets (Taken 10/28/2021 1232) Pt Will Perform Grooming:  standing  with min guard assist  with adaptive equipment Goal: Pt. Will Perform Lower Body Bathing Flowsheets (Taken 10/28/2021 1232) Pt Will Perform Lower Body Bathing:  with modified independence  sitting/lateral leans  with adaptive equipment Goal: Pt. Will Transfer To Toilet Flowsheets (Taken 10/28/2021 1232) Pt Will Transfer to Toilet:  with supervision  stand pivot transfer  Larey Seat OT, MOT

## 2021-10-28 NOTE — Care Management Important Message (Signed)
Important Message  Patient Details  Name: Anne [name] MRN: [id number] Date of Birth: 04-09-1981   Medicare Important Message Given:  Yes     Tommy Medal 10/28/2021, 12:17 PM

## 2021-10-28 NOTE — TOC Transition Note (Signed)
Transition of Care [facility]) - CM/SW Discharge Note   Patient Details  Name: Anne [name] MRN: [id number] Date of Birth: 27-Apr-1981  Transition of Care [facility] CM/SW Contact:  Ihor Gully, LCSW Phone Number: 10/28/2021, 1:06 PM   Clinical Narrative:    Patient has made the decision to go home. She was provided a bed offer from [facility] however after initially accepting she decided to go home. Patient will be staying with her mother at [address] [place] in [place]. Patient has a CAP aide 30 hours per week. Patient agreeable to [facility] PT/RN. Providers discussed. Referral made to abd accepted by [facility] with [facility]. Mother's address provided to him.    Final next level of care: [place] Barriers to Discharge: No Barriers Identified   Patient Goals and CMS Choice Patient states their goals for this hospitalization and ongoing recovery are:: to get better CMS Medicare.gov Compare Post Acute Care list provided to:: Patient Choice offered to / list presented to : Patient  Discharge Placement                       Discharge Plan and Services   Discharge Planning Services: CM Consult                      HH Arranged: RN, PT [facility] Agency: [place] ([place]) Date [place]: 10/25/21 Time [place]: 0100 Representative spoke with at [place]: [place] ([facility]) Interventions     Readmission Risk Interventions Readmission Risk Prevention Plan 10/26/2021 10/24/2021  Transportation Screening Complete -  [place] or [place] Complete Complete  Social Work Consult for [place] Planning/Counseling Complete Complete  Palliative Care Screening Not Applicable Not Applicable  Medication Review Press photographer) Complete Complete  Some recent data might be hidden

## 2021-10-28 NOTE — Discharge Summary (Addendum)
Physician Discharge Summary  Anne [name] YYT:[id number] DOB: [birth year] DOA: 10/17/2021  PCP: Alfonse Flavors, MD  Admit date: 10/17/2021  Discharge date: 10/28/2021  Admitted From:Home  Disposition:  Home health   Recommendations for Outpatient Follow-up:  Follow up with PCP in 1-2 weeks Please obtain BMP/CBC in one week Please follow-up with general surgery, Dr. Arnoldo Morale as recommended on 12/20 Continue pain medications as prescribed below Continue other home medications as prior  Home Health: Yes with PT/RN; pt refused SNF last minute  Equipment/Devices: None  Discharge Condition:Stable  CODE STATUS: Full  Diet recommendation: Heart Healthy/carb modified  Brief/Interim Summary:  40 year old with history of type 2 diabetes on insulin, peripheral vascular disease, hyperlipidemia, essential hypertension, previous transmetatarsal amputation right foot, chronic combined heart failure presented with 1 month of pain and swelling drainage and discharge from the left foot not relieved by outpatient therapies.  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  She was also struggling with some hyperglycemia during the course of the stay, but this should improve with resumption of her usual oral hypoglycemic regimen.  She has required a total of 3 units of PRBC transfusion and was noted to have some iron deficiency anemia for which she has been prescribed some iron supplementation on discharge.  No other acute events noted throughout the course of the stay and she was going to be discharged to SNF, but last-minute refused and decided to go home with home health.  Discharge Diagnoses:  Principal Problem:   Osteomyelitis ([place]) Active Problems:   Type 2 diabetes mellitus with vascular disease ([place])   Hyperlipidemia associated with type 2  diabetes mellitus ([place])   Essential hypertension   Intractable nausea and vomiting   Osteomyelitis of second toe of left foot ([place])   Preoperative cardiovascular examination  Principal discharge diagnosis: Left diabetic foot ulcer with cellulitis and necrosis/osteomyelitis status post BKA.  Iron deficiency anemia requiring total 3 unit PRBC transfusion.  Discharge Instructions  Discharge Instructions     Diet - low sodium heart healthy   Complete by: As directed    Increase activity slowly   Complete by: As directed    Leave dressing on - Keep it clean, dry, and intact until clinic visit   Complete by: As directed       Allergies as of 10/28/2021       Reactions   Canagliflozin Other (See Comments)   Vaginal yeast infections   Nsaids Other (See Comments)   Yeast infection        Medication List     STOP taking these medications    brimonidine 0.2 % ophthalmic solution Commonly known as: ALPHAGAN   clindamycin 150 MG capsule Commonly known as: CLEOCIN   dorzolamide-timolol 22.3-6.8 MG/ML ophthalmic solution Commonly known as: COSOPT   linezolid 600 MG tablet Commonly known as: ZYVOX   promethazine 12.5 MG suppository Commonly known as: PHENERGAN   Santyl ointment Generic drug: collagenase       TAKE these medications    acetaminophen 500 MG tablet Commonly known as: TYLENOL Take 500 mg by mouth every 4 (four) hours as needed.   albuterol 108 (90 Base) MCG/ACT inhaler Commonly known as: VENTOLIN HFA Inhale 2 puffs into the lungs every 6 (six) hours as needed for wheezing or shortness of breath.   aspirin 81 MG EC tablet Commonly known as: GNP  Aspirin Low Dose Take 1 tablet (81 mg total) by mouth daily with breakfast. Swallow whole.   atorvastatin 80 MG tablet Commonly known as: LIPITOR Take 1 tablet (80 mg total) by mouth every evening.   Baqsimi Two Pack 3 MG/DOSE Powd Generic drug: Glucagon Place 3 mg into the nose once as needed (for  emergency low blood sugar levels).   Bydureon BCise 2 MG/0.85ML Auij Generic drug: Exenatide ER Inject 2 mg into the skin every Thursday.   carvedilol 3.125 MG tablet Commonly known as: COREG Take 1 tablet (3.125 mg total) by mouth 2 (two) times daily.   Ensure Max Protein Liqd Take 330 mLs (11 oz total) by mouth 2 (two) times daily.   ezetimibe 10 MG tablet Commonly known as: ZETIA TAKE 1 TABLET BY MOUTH ONCE DAILY.   fenofibrate 145 MG tablet Commonly known as: Tricor Take 1 tablet (145 mg total) by mouth daily.   furosemide 40 MG tablet Commonly known as: LASIX Take 1 tablet (40 mg total) by mouth 2 (two) times daily.   isosorbide mononitrate 30 MG 24 hr tablet Commonly known as: IMDUR TAKE 1 TABLET BY MOUTH ONCE DAILY.   Lantus SoloStar 100 UNIT/ML Solostar Pen Generic drug: insulin glargine INNJECT 50 UNITS S.Q. ONCE DAILY AT 10 P.M. What changed: See the new instructions.   losartan 25 MG tablet Commonly known as: COZAAR Take 1 tablet (25 mg total) by mouth every evening.   metaxalone 400 MG tablet Commonly known as: SKELAXIN Take 1 tablet (400 mg total) by mouth 4 (four) times daily.   metFORMIN 1000 MG tablet Commonly known as: GLUCOPHAGE Take 1,000 mg by mouth 2 (two) times daily with a meal.   metoCLOPramide 5 MG tablet Commonly known as: REGLAN Take 1 tablet (5 mg total) by mouth 3 (three) times daily before meals.   nitroGLYCERIN 0.4 MG SL tablet Commonly known as: Nitrostat Place 1 tablet (0.4 mg total) under the tongue every 5 (five) minutes as needed.   NovoLOG FlexPen 100 UNIT/ML FlexPen Generic drug: insulin aspart INJECT 12-18 UNITS S.Q. THREE TIMES DAILY WITH MEALS. What changed: See the new instructions.   ondansetron 4 MG disintegrating tablet Commonly known as: Zofran ODT Take 1 tablet (4 mg total) by mouth every 8 (eight) hours as needed. 4mg  ODT q4 hours prn nausea/vomit   ondansetron 4 MG tablet Commonly known as: ZOFRAN Take 1  tablet (4 mg total) by mouth every 8 (eight) hours as needed for nausea or vomiting.   oxyCODONE 5 MG immediate release tablet Commonly known as: Oxy IR/ROXICODONE Take 1 tablet (5 mg total) by mouth every 4 (four) hours as needed for moderate pain. What changed:  how much to take when to take this reasons to take this   pantoprazole 40 MG tablet Commonly known as: Protonix Take 1 tablet (40 mg total) by mouth daily.   pregabalin 75 MG capsule Commonly known as: LYRICA Take 1 capsule (75 mg total) by mouth daily.   sertraline 100 MG tablet Commonly known as: ZOLOFT Take 200 mg by mouth at bedtime.   spironolactone 25 MG tablet Commonly known as: ALDACTONE Take 12.5 mg by mouth daily.   ticagrelor 60 MG Tabs tablet Commonly known as: [name] Take 1 tablet (60 mg total) by mouth 2 (two) times daily. What changed:  medication strength how much to take   Vitamin D (Ergocalciferol) 1.25 MG (50000 UNIT) Caps capsule Commonly known as: DRISDOL Take 50,000 Units by mouth every 7 (seven) days.  Durable Medical Equipment  (From admission, onward)           Start     Ordered   10/27/21 1042  For home use only DME lightweight manual wheelchair with seat cushion  Once       Comments: Patient suffers from  [place] which impairs their ability to perform daily activities like bathing, dressing, grooming, and toileting in the home.  A cane, crutch, or walker will not resolve  issue with performing activities of daily living. A wheelchair will allow patient to safely perform daily activities. Patient is not able to propel themselves in the home using a standard weight wheelchair due to general weakness. Patient can self propel in the lightweight wheelchair. Length of need Lifetime. Accessories: elevating leg rests (ELRs), wheel locks, extensions and anti-tippers.   10/27/21 1043              Discharge Care Instructions  (From admission, onward)            Start     Ordered   10/28/21 0000  Leave dressing on - Keep it clean, dry, and intact until clinic visit        10/28/21 1041            Contact information for follow-up providers     Aviva Signs, MD. Schedule an appointment as soon as possible for a visit on 11/11/2021.   Specialty: General Surgery Contact information: [address] [place] [address] [phone]         Zhou-Talbert, Elwyn Lade, MD. Schedule an appointment as soon as possible for a visit.   Specialty: Family Medicine Contact information: [address] [place] [address] [phone]              Contact information for after-discharge care     [facility] .   Service: Skilled Nursing Contact information: [address] [place] [address] [phone]                    Allergies  Allergen Reactions   Canagliflozin Other (See Comments)    Vaginal yeast infections   Nsaids Other (See Comments)    Yeast infection     Consultations: General surgery   Procedures/Studies: DG Ankle Complete Left  Result Date: 10/17/2021 CLINICAL DATA:  Initial evaluation for acute redness, pain. History of osteomyelitis. EXAM: LEFT ANKLE COMPLETE - 3+ VIEW COMPARISON:  None. FINDINGS: No acute fracture or dislocation. Ankle mortise approximated. No erosive changes to suggest acute osteomyelitis about the ankle. No visible soft tissue abnormality. Few scattered vascular calcifications noted. Age-indeterminate erosive changes about the fifth metatarsal head, partially visualized, and better evaluated on concomitant radiograph of foot. IMPRESSION: 1. No acute osseous abnormality about the left ankle. No radiographic evidence for acute osteomyelitis. 2. Age-indeterminate erosive changes about the fifth metatarsal head, better evaluated on concomitant radiograph of the foot. Electronically Signed   By:  Jeannine Boga M.D.   On: 10/17/2021 23:50   US ARTERIAL ABI (SCREENING LOWER EXTREMITY)  Result Date: 10/20/2021 CLINICAL DATA:  Left lower extremity redness, foot and ankle swelling, and osteomyelitis. Prior right toe amputation. EXAM: NONINVASIVE PHYSIOLOGIC VASCULAR STUDY OF BILATERAL LOWER EXTREMITIES TECHNIQUE: Evaluation of both lower extremities were performed at rest, including calculation of ankle-brachial indices with single level Doppler, pressure and pulse volume recording. COMPARISON:  None. FINDINGS: Right ABI:  1.14 Left ABI:  0.78 Right  Lower Extremity: Monophasic waveforms seen at posterior tibial and dorsalis pedis arteries. Left Lower Extremity: Monophasic waveform seen at posterior tibial and dorsalis pedis arteries. 0.5-0.79 Moderate PAD IMPRESSION: 1. Moderate PVD of the left lower extremity. 2. Although ABI values in the right lower extremity are within normal limits, findings are suspicious for significant lower extremity arterial occlusive disease given monophasic waveforms. Electronically Signed   By: Miachel Roux M.D.   On: 10/20/2021 07:39   DG Foot Complete Left  Result Date: 10/17/2021 CLINICAL DATA:  Initial evaluation for redness, swelling. History of osteomyelitis. EXAM: LEFT FOOT - COMPLETE 3+ VIEW COMPARISON:  Prior radiograph from 07/01/2018. FINDINGS: There has been progressive osseous erosion about the fifth metatarsal head since previous exam. Progressive erosion and/or postsurgical changes seen about the left fifth proximal, middle, and distal phalanges as well. Finding of uncertain acuity, and could reflect progressive but chronic changes of osteomyelitis. There is additional osseous erosion centered about the left second PIP joint, concerning for osteomyelitis. Erosive changes with lucency extends to involve the adjacent second middle and proximal phalanges. Overlying soft tissue swelling. No visible soft tissue emphysema. Possible superimposed oblique  fracture extending through the base of the left second proximal phalanx with intra-articular extension noted. No other acute osseous abnormality about the foot. 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 Small posterior calcaneal enthesophyte. No other soft tissue abnormality. Scattered vascular calcifications noted. IMPRESSION: 1. Osseous erosive changes centered about the left second PIP joint, concerning for acute osteomyelitis. 2. Possible superimposed oblique fracture extending through the base of the left second proximal phalanx with intra-articular extension. 3. Progressive osseous erosion about the left fifth metatarsal head as well as the fifth proximal, middle, and distal phalanges. Finding is of uncertain acuity, and could reflect progressive but chronic changes of osteomyelitis. Electronically Signed   By: Jeannine Boga M.D.   On: 10/17/2021 23:48   ECHOCARDIOGRAM COMPLETE  Result Date: 10/18/2021    ECHOCARDIOGRAM REPORT   Patient Name:   Anne [name] Date of Exam: 10/18/2021 Medical Rec #:  [id number]        Height:       69.0 in Accession #:    [id number]       Weight:       167.5 lb Date of Birth:  06/08/1981        BSA:          1.916 m Patient Age:    26 years         BP:           108/64 mmHg Patient Gender: F                HR:           98 bpm. Exam Location:  [facility] Procedure: 2D Echo, Cardiac Doppler and Color Doppler Indications:    Congestive Heart Failure I50.9  History:        Patient has prior history of Echocardiogram examinations, most                 recent 07/08/2020. CHF, CAD and Previous Myocardial Infarction;                 Risk Factors:Hypertension, Diabetes and Dyslipidemia. S/P PTCA                 (percutaneous transluminal coronary angioplasty).  Sonographer:    Alvino Chapel RCS Referring Phys: [id number] [place]  1. Hypokinesis of the mid-apical anterolateral,  apical inferolatera, apical anterior, and apical myocardium. Left  ventricular ejection fraction, by estimation, is 45 to 50%. The left ventricle has mildly decreased function. The left ventricle demonstrates regional wall motion abnormalities (see scoring diagram/findings for description). Left ventricular diastolic parameters are consistent with Grade II diastolic dysfunction (pseudonormalization).  2. Right ventricular systolic function is normal. The right ventricular size is normal.  3. The mitral valve is normal in structure. No evidence of mitral valve regurgitation. No evidence of mitral stenosis.  4. The aortic valve is tricuspid. Aortic valve regurgitation is not visualized. No aortic stenosis is present.  5. The inferior vena cava is normal in size with greater than 50% respiratory variability, suggesting right atrial pressure of 3 mmHg. FINDINGS  Left Ventricle: Hypokinesis of the mid-apical anterolateral, apical inferolatera, apical anterior, and apical myocardium. Left ventricular ejection fraction, by estimation, is 45 to 50%. The left ventricle has mildly decreased function. The left ventricle demonstrates regional wall motion abnormalities. The left ventricular internal cavity size was normal in size. There is no left ventricular hypertrophy. Left ventricular diastolic parameters are consistent with Grade II diastolic dysfunction (pseudonormalization). Right Ventricle: The right ventricular size is normal. No increase in right ventricular wall thickness. Right ventricular systolic function is normal. Left Atrium: Left atrial size was normal in size. Right Atrium: Right atrial size was normal in size. Pericardium: There is no evidence of pericardial effusion. Mitral Valve: The mitral valve is normal in structure. No evidence of mitral valve regurgitation. No evidence of mitral valve stenosis. Tricuspid Valve: The tricuspid valve is normal in structure. Tricuspid valve regurgitation is not demonstrated. No evidence of tricuspid stenosis. Aortic Valve: The aortic  valve is tricuspid. Aortic valve regurgitation is not visualized. No aortic stenosis is present. Pulmonic Valve: The pulmonic valve was normal in structure. Pulmonic valve regurgitation is not visualized. No evidence of pulmonic stenosis. Aorta: The aortic root is normal in size and structure. Venous: The inferior vena cava is normal in size with greater than 50% respiratory variability, suggesting right atrial pressure of 3 mmHg. IAS/Shunts: No atrial level shunt detected by color flow Doppler.  LEFT VENTRICLE PLAX 2D LVIDd:         4.70 cm   Diastology LVIDs:         3.10 cm   LV e' medial:    6.53 cm/s LV PW:         1.00 cm   LV E/e' medial:  17.5 LV IVS:        0.90 cm   LV e' lateral:   7.62 cm/s LVOT diam:     2.00 cm   LV E/e' lateral: 15.0 LV SV:         61 LV SV Index:   32 LVOT Area:     3.14 cm  RIGHT VENTRICLE RV S prime:     13.20 cm/s TAPSE (M-mode): 1.9 cm LEFT ATRIUM             Index        RIGHT ATRIUM           Index LA diam:        4.20 cm 2.19 cm/m   RA Area:     14.70 cm LA Vol (A2C):   41.2 ml 21.50 ml/m  RA Volume:   34.80 ml  18.16 ml/m LA Vol (A4C):   46.5 ml 24.27 ml/m LA Biplane Vol: 48.1 ml 25.10 ml/m  AORTIC VALVE LVOT Vmax:   108.00 cm/s LVOT  Vmean:  68.500 cm/s LVOT VTI:    0.193 m  AORTA Ao Root diam: 3.50 cm MITRAL VALVE MV Area (PHT): 4.06 cm     SHUNTS MV Decel Time: 187 msec     Systemic VTI:  0.19 m MV E velocity: 114.00 cm/s  Systemic Diam: 2.00 cm MV A velocity: 101.00 cm/s MV E/A ratio:  1.13 Skeet Latch MD Electronically signed by Skeet Latch MD Signature Date/Time: 10/18/2021/4:19:07 PM    Final      Discharge Exam: Vitals:   10/27/21 1930 10/28/21 0456  BP: 115/72 130/78  Pulse: (!) 104 97  Resp: 16 16  Temp: 98.6 F (37 C) 98.6 F (37 C)  SpO2: 97% 100%   Vitals:   10/27/21 1515 10/27/21 1530 10/27/21 1930 10/28/21 0456  BP: 107/76 111/76 115/72 130/78  Pulse: (!) 102 98 (!) 104 97  Resp: 18 17 16 16   Temp: 98.7 F (37.1 C) 98.5 F  (36.9 C) 98.6 F (37 C) 98.6 F (37 C)  TempSrc: Oral  Oral   SpO2: 95%  97% 100%  Weight:      Height:        General: Pt is alert, awake, not in acute distress Cardiovascular: RRR, S1/S2 +, no rubs, no gallops Respiratory: CTA bilaterally, no wheezing, no rhonchi Abdominal: Soft, NT, ND, bowel sounds + Extremities: no edema, no cyanosis, left BKA dressings clean dry and intact    The results of significant diagnostics from this hospitalization (including imaging, microbiology, ancillary and laboratory) are listed below for reference.     Microbiology: Recent Results (from the past 240 hour(s))  Surgical pcr screen     Status: None   Collection Time: 10/24/21  5:35 AM   Specimen: Nasal Mucosa; Nasal Swab  Result Value Ref Range Status   MRSA, PCR NEGATIVE NEGATIVE Final   Staphylococcus aureus NEGATIVE NEGATIVE Final    Comment: (NOTE) The Xpert SA Assay (FDA approved for NASAL specimens in patients 82 years of age and older), is one component of a comprehensive surveillance program. It is not intended to diagnose infection nor to guide or monitor treatment. Performed at [facility], [address] [place], [place] [address]      Labs: BNP (last 3 results) No results for input(s): BNP in the last 8760 hours. Basic Metabolic Panel: Recent Labs  Lab 10/25/21 0427 10/25/21 1946 10/26/21 0533 10/27/21 0434 10/28/21 0557  NA 133* 134* 135 136 139  K 4.1 5.1 4.2 4.0 4.5  CL 101 99 103 105 105  CO2 25 27 24 24 26   GLUCOSE 238* 207* 114* 149* 162*  BUN 18 21* 18 15 12   CREATININE 0.88 0.92 0.75 0.76 0.85  CALCIUM 7.9* 8.3* 8.1* 7.9* 8.6*  MG 1.8 1.7 1.7 1.9 2.0  PHOS  --  3.6  --   --   --    Liver Function Tests: No results for input(s): AST, ALT, ALKPHOS, BILITOT, PROT, ALBUMIN in the last 168 hours. No results for input(s): LIPASE, AMYLASE in the last 168 hours. No results for input(s): AMMONIA in the last 168 hours. CBC: Recent Labs  Lab 10/22/21 0451  10/23/21 0458 10/24/21 0033 10/24/21 0505 10/25/21 0427 10/25/21 1946 10/26/21 0533 10/27/21 0434 10/27/21 1028 10/28/21 0557  WBC 7.0 7.0   < > 8.4 10.0 12.4* 12.8* 11.2*  --  10.6*  NEUTROABS 5.2 4.8  --  5.3 7.9*  --   --   --   --   --  HGB 7.3* 7.5*   < > 9.8* 8.3* 8.6* 8.2* 7.2* 7.0* 8.8*  HCT 24.8* 25.3*   < > 32.6* 27.7* 28.1* 26.5* 22.8* 22.6* 28.3*  MCV 83.8 84.3   < > 85.1 85.2 85.2 85.2 84.4  --  85.8  PLT 342 366   < > 397 366 390 373 332  --  355   < > = values in this interval not displayed.   Cardiac Enzymes: No results for input(s): CKTOTAL, CKMB, CKMBINDEX, TROPONINI in the last 168 hours. BNP: Invalid input(s): POCBNP CBG: Recent Labs  Lab 10/27/21 1104 10/27/21 1614 10/27/21 2036 10/28/21 0458 10/28/21 0716  GLUCAP 117* 148* 166* 163* 134*   D-Dimer No results for input(s): DDIMER in the last 72 hours. Hgb A1c No results for input(s): HGBA1C in the last 72 hours. Lipid Profile No results for input(s): CHOL, HDL, LDLCALC, TRIG, CHOLHDL, LDLDIRECT in the last 72 hours. Thyroid function studies No results for input(s): TSH, T4TOTAL, T3FREE, THYROIDAB in the last 72 hours.  Invalid input(s): FREET3 Anemia work up No results for input(s): VITAMINB12, FOLATE, FERRITIN, TIBC, IRON, RETICCTPCT in the last 72 hours. Urinalysis    Component Value Date/Time   COLORURINE YELLOW 10/18/2021 [place] 10/18/2021 0941   LABSPEC 1.025 10/18/2021 0941   PHURINE 5.5 10/18/2021 0941   GLUCOSEU >=500 (A) 10/18/2021 0941   HGBUR SMALL (A) 10/18/2021 0941   BILIRUBINUR NEGATIVE 10/18/2021 0941   BILIRUBINUR neg 06/03/2012 1536   KETONESUR NEGATIVE 10/18/2021 0941   PROTEINUR 100 (A) 10/18/2021 0941   UROBILINOGEN 0.2 06/26/2013 1354   NITRITE NEGATIVE 10/18/2021 0941   LEUKOCYTESUR NEGATIVE 10/18/2021 0941   Sepsis Labs Invalid input(s): PROCALCITONIN,  WBC,  LACTICIDVEN Microbiology Recent Results (from the past 240 hour(s))  Surgical  pcr screen     Status: None   Collection Time: 10/24/21  5:35 AM   Specimen: Nasal Mucosa; Nasal Swab  Result Value Ref Range Status   MRSA, PCR NEGATIVE NEGATIVE Final   Staphylococcus aureus NEGATIVE NEGATIVE Final    Comment: (NOTE) The Xpert SA Assay (FDA approved for NASAL specimens in patients 19 years of age and older), is one component of a comprehensive surveillance program. It is not intended to diagnose infection nor to guide or monitor treatment. Performed at [facility], [address] [place],  [address]      Time coordinating discharge: 35 minutes  SIGNED:   Rodena Goldmann, DO [facility] 10/28/2021, 10:53 AM  If 7PM-7AM, please contact night-coverage [email or web address]

## 2021-11-11 ENCOUNTER — Encounter: Payer: Self-pay | Admitting: General Surgery

## 2021-11-11 ENCOUNTER — Other Ambulatory Visit: Payer: Self-pay

## 2021-11-11 ENCOUNTER — Ambulatory Visit (INDEPENDENT_AMBULATORY_CARE_PROVIDER_SITE_OTHER): Payer: Medicare Other | Admitting: General Surgery

## 2021-11-11 VITALS — BP 105/71 | HR 89 | Temp 97.8°F | Resp 18 | Ht 69.0 in | Wt 167.0 lb

## 2021-11-11 DIAGNOSIS — Z09 Encounter for follow-up examination after completed treatment for conditions other than malignant neoplasm: Secondary | ICD-10-CM

## 2021-11-11 MED ORDER — CYCLOBENZAPRINE HCL 10 MG PO TABS: 10.0000 mg | ORAL_TABLET | Freq: Three times a day (TID) | ORAL | 1 refills | Status: AC | PRN

## 2021-11-11 MED ORDER — OXYCODONE HCL 5 MG PO TABS: 5.0000 mg | ORAL_TABLET | ORAL | 0 refills | Status: DC | PRN

## 2021-11-11 NOTE — Progress Notes (Signed)
Subjective:     Anne [name]  Patient here for postoperative visit, status post left below the knee amputation.  Patient went home and has been doing well.  She is trying to keep her left knee extended.  She has had minimal serosanguineous fluid emanating from the midportion of the incision. Objective:    BP 105/71    Pulse 89    Temp 97.8 F (36.6 C) (Oral)    Resp 18    Ht 5\' 9"  (1.753 m)    Wt 167 lb (75.8 kg)    SpO2 99%    BMI 24.66 kg/m   General:  alert, cooperative, and no distress  Left BKA incision healing well.  Staples removed, Steri-Strips applied.  There does seem to be a resolving serosanguineous fluid collection laterally.  I was able to express some of this fluid out.  An Ace wrap was applied.     Assessment:    Doing well postoperatively.    Plan:   I told the patient to keep the wound clean and dry.  She can elevate the left leg at night.  Referral will be made to biomed for a stump shrinker and prosthesis.  Follow-up here as needed.  We will reorder her Skelaxin and oxycodone.

## 2021-11-12 ENCOUNTER — Telehealth: Payer: Self-pay

## 2021-11-12 NOTE — Telephone Encounter (Signed)
Scheduled initial palliative consult for 12/01/21 @ 3 pm

## 2021-11-14 ENCOUNTER — Other Ambulatory Visit: Payer: Self-pay

## 2021-11-14 ENCOUNTER — Encounter: Payer: Self-pay | Admitting: *Deleted

## 2021-11-14 ENCOUNTER — Encounter: Payer: Self-pay | Admitting: Internal Medicine

## 2021-11-14 ENCOUNTER — Ambulatory Visit (INDEPENDENT_AMBULATORY_CARE_PROVIDER_SITE_OTHER): Payer: Medicare Other | Admitting: Internal Medicine

## 2021-11-14 VITALS — BP 118/76 | HR 104 | Wt 168.0 lb

## 2021-11-14 DIAGNOSIS — I2510 Atherosclerotic heart disease of native coronary artery without angina pectoris: Secondary | ICD-10-CM

## 2021-11-14 NOTE — Patient Instructions (Signed)
Medication Instructions:  Your physician recommends that you continue on your current medications as directed. Please refer to the Current Medication list given to you today.  *If you need a refill on your cardiac medications before your next appointment, please call your pharmacy*   Lab Work: NONE   If you have labs (blood work) drawn today and your tests are completely normal, you will receive your results only by: [place] (if you have MyChart) OR A paper copy in the mail If you have any lab test that is abnormal or we need to change your treatment, we will call you to review the results.   Testing/Procedures: NONE    Follow-Up: At [facility], you and your health needs are our priority.  As part of our continuing mission to provide you with exceptional heart care, we have created designated Provider Care Teams.  These Care Teams include your primary Cardiologist (physician) and Advanced Practice Providers (APPs -  Physician Assistants and Nurse Practitioners) who all work together to provide you with the care you need, when you need it.  We recommend signing up for the patient portal called "MyChart".  Sign up information is provided on this After Visit Summary.  MyChart is used to connect with patients for Virtual Visits (Telemedicine).  Patients are able to view lab/test results, encounter notes, upcoming appointments, etc.  Non-urgent messages can be sent to your provider as well.   To learn more about what you can do with MyChart, go to [email or web address].    Your next appointment:   4-5 month(s)  The format for your next appointment:   In Person  Provider:   Dorris Carnes, MD    Other Instructions Thank you for choosing [facility]!

## 2021-11-14 NOTE — Progress Notes (Signed)
Cardiology Office Note   Date:  11/14/2021   ID:  Anne [name], DOB 09/25/1981, MRN [id number]  PCP:  Danna Hefty, DO  Cardiologist:   Dorris Carnes, MD   Pt presents for F/U of CAD      History of Present Illness: Anne [name] is a 40 y.o. female with a history of GERD, anxiety, OA, CKD, Type II DM, gastroparesis, HL, HTN, CAD (anterior MI in 201912/19 >> LHC - dLM 25, mLAD 99, oOM2 100 (R-L collats), irreg RCA, EF 25-35 >> PCI: PCI to mLAD), ischemic cardiovmyopathy   Dressler syndrome.  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   albuterol (VENTOLIN HFA) 108 (90 Base) MCG/ACT inhaler Inhale 2 puffs into the lungs every 6 (six) hours as needed for wheezing or shortness of breath.   aspirin (GNP ASPIRIN LOW DOSE) 81 MG EC tablet Take 1 tablet (81 mg total) by mouth daily with breakfast. Swallow whole.   atorvastatin (LIPITOR) 80 MG tablet Take 1 tablet (80 mg total) by mouth every evening.   BAQSIMI TWO PACK 3 MG/DOSE POWD Place 3 mg into the nose once as needed (for emergency low blood sugar levels).    BYDUREON BCISE 2 MG/0.85ML AUIJ Inject 2 mg into the skin every Thursday.   carvedilol (COREG) 3.125 MG tablet Take 1 tablet (3.125 mg total) by mouth 2 (two) times daily.   cyclobenzaprine (FLEXERIL) 10 MG tablet Take 1 tablet (10 mg total) by mouth 3 (three) times  daily as needed for muscle spasms.   Ensure Max Protein (ENSURE MAX PROTEIN) LIQD Take 330 mLs (11 oz total) by mouth 2 (two) times daily.   ezetimibe (ZETIA) 10 MG tablet TAKE 1 TABLET BY MOUTH ONCE DAILY.   fenofibrate (TRICOR) 145 MG tablet Take 1 tablet (145 mg total) by mouth daily.   ferrous sulfate 325 (65 FE) MG tablet Take 1 tablet (325 mg total) by mouth daily.   isosorbide mononitrate (IMDUR) 30 MG 24 hr tablet TAKE 1 TABLET BY MOUTH ONCE DAILY.   LANTUS SOLOSTAR 100 UNIT/ML Solostar Pen INNJECT 50 UNITS S.Q. ONCE DAILY AT 10 P.M. (Patient taking differently: Inject 50 Units into the skin at bedtime.)   losartan (COZAAR) 25 MG tablet Take 1 tablet (25 mg total) by mouth every evening.   metFORMIN (GLUCOPHAGE) 1000 MG tablet Take 1,000 mg by mouth 2 (two) times daily with a meal.   metoCLOPramide (REGLAN) 5 MG tablet Take 1 tablet (5 mg total) by mouth 3 (three) times daily before meals.   nitroGLYCERIN (NITROSTAT) 0.4 MG SL tablet Place 1 tablet (0.4 mg total) under the tongue every 5 (five) minutes as needed.  NOVOLOG FLEXPEN 100 UNIT/ML FlexPen INJECT 12-18 UNITS S.Q. THREE TIMES DAILY WITH MEALS. (Patient taking differently: Inject 12-18 Units into the skin 3 (three) times daily with meals.)   ondansetron (ZOFRAN ODT) 4 MG disintegrating tablet Take 1 tablet (4 mg total) by mouth every 8 (eight) hours as needed. 4mg  ODT q4 hours prn nausea/vomit   ondansetron (ZOFRAN) 4 MG tablet Take 1 tablet (4 mg total) by mouth every 8 (eight) hours as needed for nausea or vomiting.   oxyCODONE (OXY IR/ROXICODONE) 5 MG immediate release tablet Take 1 tablet (5 mg total) by mouth every 4 (four) hours as needed for moderate pain.   pregabalin (LYRICA) 75 MG capsule Take 1 capsule (75 mg total) by mouth daily.   sertraline (ZOLOFT) 100 MG tablet Take 200 mg by mouth at bedtime.    spironolactone (ALDACTONE) 25 MG tablet Take 12.5 mg by mouth daily.   ticagrelor ([name]) 60 MG TABS tablet Take 1  tablet (60 mg total) by mouth 2 (two) times daily.   Vitamin D, Ergocalciferol, (DRISDOL) 1.25 MG (50000 UNIT) CAPS capsule Take 50,000 Units by mouth every 7 (seven) days.     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 [facility])    CAD (coronary artery disease) 11/13/2018   Late presentation anterior MI 12/19 >> LHC - dLM 25, mLAD 99, oOM2 100 (R-L collats), irreg RCA, EF 25-35 >> PCI: POBA to mLAD   Chronic systolic CHF (congestive heart failure) ([place]) 11/28/2018   Ischemic CM // late presentation ant MI 10/2018 tx with POBA to LAD (residual CAD with CTO of the OM2) // Echo 12/19:  No mural apical thrombus, septal, apical mid ant and inf HK; mild LVH, EF 30-35, mild MR // Echo 01/2019: EF 30-35, Gr 1 DD, diff HK, apical AK, mild MR    CKD (chronic kidney disease), stage I    Diabetes mellitus type 1 ([place])    Dyspnea    Former tobacco use    Gastroparesis    Hypercholesteremia    Hypertension    Myocardial infarction ([place]) 2019   Osteomyelitis ([place])    a. s/p R forefoot amputation.   Renal failure    Sciatica     Past Surgical History:  Procedure Laterality Date   AMPUTATION Right 11/03/2011   Procedure: AMPUTATION RAY;  Surgeon: Jamesetta So;  Location: [facility];  Service: General;  Laterality: Right;  Right fourth and fifth metatarsal    AMPUTATION Left 10/24/2021   Procedure: AMPUTATION BELOW KNEE;  Surgeon: Aviva Signs, MD;  Location: [facility];  Service: General;  Laterality: Left;   APPENDECTOMY     CARDIAC CATHETERIZATION  10/2018   CESAREAN SECTION  2004 and 2007   x2   CORONARY/GRAFT ACUTE MI REVASCULARIZATION N/A 11/11/2018   Procedure: CORONARY/GRAFT ACUTE MI REVASCULARIZATION;  Surgeon: Jettie Booze, MD;  Location: [facility];  Service: Cardiovascular;  Laterality: N/A;   FRACTURE SURGERY  2000   INJECTION OF SILICONE  OIL Right 18/84/1660   Procedure: Injection Of Silicone Oil;  Surgeon: Bernarda Caffey, MD;  Location: [facility];  Service: Ophthalmology;  Laterality: Right;   LEFT HEART CATH AND CORONARY ANGIOGRAPHY N/A 11/11/2018   Procedure: LEFT HEART CATH AND CORONARY ANGIOGRAPHY;  Surgeon: Jettie Booze, MD;  Location: [facility];  Service: Cardiovascular;  Laterality: N/A;   MEMBRANE PEEL Right 09/14/2019   Procedure: Antoine Primas;  Surgeon: Bernarda Caffey, MD;  Location: [facility];  Service: Ophthalmology;  Laterality: Right;   PARS PLANA VITRECTOMY Right 09/14/2019   Procedure: Pars Plana Vitrectomy With 25 Gauge;  Surgeon: Bernarda Caffey, MD;  Location: [facility];  Service: Ophthalmology;  Laterality: Right;   PHOTOCOAGULATION WITH LASER Right 09/14/2019   Procedure: Photocoagulation With Laser;  Surgeon: Bernarda Caffey, MD;  Location: [facility];  Service: Ophthalmology;  Laterality: Right;   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 Her smoking use included cigarettes. She has a 5.00 pack-year smoking history. She has never used smokeless tobacco. She reports that she does not drink alcohol and does not use drugs.   Family History:  The patient's family history includes Alcoholism in her father; Anemia in her mother; Asthma in her mother; Diabetes in her father, paternal grandfather, and paternal grandmother; Heart attack in her paternal grandmother; Hypertension in her mother; Kidney disease in her mother; Lung cancer in her father.    ROS:  Please see the history of present illness. All other systems are reviewed and  Negative to the above problem except as noted.    PHYSICAL EXAM: VS:  BP 118/76    Pulse (!) 104    Wt 168 lb  (76.2 kg) Comment: per pt-in wheelchair   SpO2 97%    BMI 24.81 kg/m   GEN: Well nourished, well developed, in no acute distress  HEENT: normal  Neck: no JVD Cardiac: RRR; no murmurs ,no LE edema  Respiratory:  clear to auscultation bilaterally GI: soft, nontender,  MS: s/p R transmet amputation   s/p L BKA  Skin: warm and dry, no rash Neuro:  Strength and sensation are intact Psych: euthymic mood, full affect   EKG:  EKG is not ordered today.  Echo  10/18/21 Hypokinesis of the mid-apical anterolateral, apical inferolatera, apical anterior, and apical myocardium. Left ventricular ejection fraction, by estimation, is 45 to 50%. The left ventricle has mildly decreased function. The left ventricle demonstrates regional wall motion abnormalities (see scoring diagram/findings for description). Left ventricular diastolic parameters are consistent with Grade II diastolic dysfunction (pseudonormalization). 2. Right ventricular systolic function is normal. The right ventricular size is normal. The mitral valve is normal in structure. No evidence of mitral valve regurgitation. No evidence of mitral stenosis. 3. The aortic valve is tricuspid. Aortic valve regurgitation is not visualized. No aortic stenosis is present. 4. The inferior vena cava is normal in size with greater than 50% respiratory variability, suggesting right atrial pressure of 3 mmHg.  Lipid Panel    Component Value Date/Time   CHOL 93 (L) 06/04/2020 0923   TRIG 277 (H) 06/04/2020 0923   HDL 21 (L) 06/04/2020 0923   CHOLHDL 4.4 06/04/2020 0923   CHOLHDL 6.8 11/11/2018 1304   VLDL 33 11/11/2018 1304   LDLCALC 30 06/04/2020 0923   LDLDIRECT 98 02/10/2012 1230    Prior cardiac studies  The following studies were reviewed today:   Echo 11/13/18 Study Conclusions   - Left ventricle: No mural apical thrombus with definity. septal   apical mid anterior and inferior hypokinesis  preserved basal   function The cavity  size was moderately dilated. Wall thickness   was increased in a pattern of mild LVH. Systolic function was   moderately to severely reduced. The estimated ejection fraction   was in the range of 30% to 35%. - Mitral valve: There was mild regurgitation. - Atrial septum: No defect or patent foramen ovale was identified.   LEFT HEART CATH AND CORONARY ANGIOGRAPHY  CORONARY/GRAFT ACUTE MI REVASCULARIZATION 11/11/18     Conclusion  Ost 2nd Mrg to 2nd Mrg lesion is 100% stenosed. Likely chronic occlusion with right to left collaterals. Mid LAD lesion is 99% stenosed. PTCA with 2.0 mm balloon performed. Post intervention, there is a 25% residual stenosis. Dist LM lesion is 25% stenosed. There is severe left ventricular systolic dysfunction. The left ventricular ejection fraction is 25-35% by visual estimate. LV end diastolic pressure is mildly elevated. There is no aortic valve stenosis.          Wt Readings from Last 3 Encounters:  11/14/21 168 lb (76.2 kg)  11/11/21 167 lb (75.8 kg)  10/18/21 167 lb 8.8 oz (76 kg)      ASSESSMENT AND PLAN:  1  CAD  Pt with severe dz noted above   Last intervention was 2019  Has been on [name]   She pays very little for it   With anatomy and other problems need t oreview      2  Chronic systolic CHF   LVEF is mildly depressed on recent echo   Volume status is good    I would follow   3  HL  Last lipids in July 2021    LDL 30   Will try to get labs from Dr Tarry Kos' office    4  HTN  BP is controlled on current regimen   5  DM   A1C was over 12 this fall  She says she is trying to do better    I would stop Ensure   Lots of sugar   Consider lean protein or Glucerna    6  GI   Pt with gastroparesis  Significant  Keep on Reglan  7   Anemia   Hgb was 8.8    ON Fe supplemnts    Follow     Current medicines are reviewed at length with the patient today.  The patient does not have concerns regarding medicines.  Signed, Dorris Carnes, MD   11/14/2021 10:38 AM    [facility] [address], [place], [place]  [address] Phone: [phone]; Fax: [phone]

## 2021-11-18 ENCOUNTER — Other Ambulatory Visit: Payer: Self-pay | Admitting: Family Medicine

## 2021-11-18 DIAGNOSIS — I739 Peripheral vascular disease, unspecified: Secondary | ICD-10-CM

## 2021-11-27 ENCOUNTER — Encounter: Payer: Self-pay | Admitting: General Surgery

## 2021-11-27 ENCOUNTER — Ambulatory Visit (INDEPENDENT_AMBULATORY_CARE_PROVIDER_SITE_OTHER): Payer: Medicare Other | Admitting: General Surgery

## 2021-11-27 ENCOUNTER — Other Ambulatory Visit: Payer: Medicare Other

## 2021-11-27 ENCOUNTER — Other Ambulatory Visit: Payer: Self-pay

## 2021-11-27 VITALS — BP 103/66 | HR 94 | Temp 98.2°F | Resp 16

## 2021-11-27 DIAGNOSIS — Z09 Encounter for follow-up examination after completed treatment for conditions other than malignant neoplasm: Secondary | ICD-10-CM

## 2021-11-27 MED ORDER — OXYCODONE HCL 5 MG PO TABS: 5.0000 mg | ORAL_TABLET | Freq: Four times a day (QID) | ORAL | 0 refills | Status: DC | PRN

## 2021-11-27 NOTE — Progress Notes (Signed)
Subjective:     Anne [name]  Here for postoperative visit, status post left BKA.  Patient and home health nurse noted some superficial incision dehiscence.  She has had some clear yellow drainage emanating from it.  No purulent drainage is noted.  She is being fitted for a prosthesis soon. Objective:    BP 103/66    Pulse 94    Temp 98.2 F (36.8 C) (Oral)    Resp 16    SpO2 97%   General:  alert, cooperative, and no distress  Left BKA wound with a 1.5 to 2 cm superficial wound dehiscence with no drainage or erythema present.  Granulation tissue present.  Silver nitrate applied.     Assessment:    Superficial wound dehiscence, partial, status post left BKA    Plan:   I told the patient to keep the wound clean and dry with soap and water daily.  Follow-up here in 2 weeks for wound check.

## 2021-12-01 ENCOUNTER — Other Ambulatory Visit: Payer: Self-pay

## 2021-12-01 ENCOUNTER — Other Ambulatory Visit: Payer: Medicare Other | Admitting: Primary Care

## 2021-12-10 ENCOUNTER — Encounter: Payer: Medicare Other | Admitting: General Surgery

## 2021-12-11 ENCOUNTER — Ambulatory Visit (INDEPENDENT_AMBULATORY_CARE_PROVIDER_SITE_OTHER): Payer: Medicare Other | Admitting: Surgery

## 2021-12-11 ENCOUNTER — Other Ambulatory Visit: Payer: Self-pay

## 2021-12-11 ENCOUNTER — Other Ambulatory Visit: Payer: Self-pay | Admitting: Internal Medicine

## 2021-12-11 ENCOUNTER — Encounter: Payer: Self-pay | Admitting: Surgery

## 2021-12-11 VITALS — BP 135/93 | HR 88 | Temp 98.7°F | Resp 16 | Ht 69.0 in | Wt 173.0 lb

## 2021-12-11 DIAGNOSIS — Z09 Encounter for follow-up examination after completed treatment for conditions other than malignant neoplasm: Secondary | ICD-10-CM

## 2021-12-11 NOTE — Patient Instructions (Signed)
Apply Santyl to left BKA site, at open wound at the middle of the incision site.  Cover with damp 4 x 4, then cover with dry 4 x 4 and wrap with Kerlix.  Call if needed for Santyl refill.

## 2021-12-11 NOTE — Progress Notes (Signed)
[facility] Note   HPI:  41 y.o. Female presents to clinic for follow-up after left BKA on 10/24/2021.  She was seen in clinic on 11/27/2021, and at that time she was noted to have a small superficial incision dehiscence.  She denies drainage from the area.  Her home health nurse was concerned that the area might be infected.  She denies fever, chills, chest pain, shortness of breath, and significant pain associated with her BKA site.  She was planning to be fitted for her prosthesis once this area had healed.  She has no other complaints at this time.  Review of Systems:  All other review of systems: otherwise negative   Vital Signs:  BP (!) 135/93    Pulse 88    Temp 98.7 F (37.1 C) (Other (Comment))    Resp 16    Ht 5\' 9"  (1.753 m)    Wt 173 lb (78.5 kg)    SpO2 97%    BMI 25.55 kg/m    Physical Exam:  Physical Exam Vitals reviewed.  Constitutional:      Appearance: Normal appearance.  HENT:     Head: Normocephalic and atraumatic.  Eyes:     Extraocular Movements: Extraocular movements intact.     Pupils: Pupils are equal, round, and reactive to light.  Cardiovascular:     Rate and Rhythm: Normal rate and regular rhythm.  Pulmonary:     Effort: Pulmonary effort is normal.     Breath sounds: Normal breath sounds.  Abdominal:     General: There is no distension.     Palpations: Abdomen is soft.     Tenderness: There is no abdominal tenderness.  Musculoskeletal:     Cervical back: Normal range of motion.     Comments: Left BKA site with 1 cm central area of dehiscence, with overlying fibrinous exudate.  No purulent drainage noted, no erythema.  The fibrinous exudate was debrided from this area, and covered with a damp 4 x 4, dry 4 x 4's, and the stump wrapped with Kerlix  Skin:    General: Skin is warm and dry.  Neurological:     General: No focal deficit present.     Mental Status: She is alert and oriented to person, place, and time.  Psychiatric:         Mood and Affect: Mood normal.        Behavior: Behavior normal.    Laboratory studies: None  Imaging:  None  Assessment:  41 y.o. yo Female who is status post left BKA on 10/24/2021, who presents for evaluation of small wound dehiscence  Plan:  -Fibrinous exudate debrided off of the open area as best as possible -Patient currently using Santyl on right foot.  Recommended applying small amount of Santyl with a damp 4 x 4 to this open area to help get good granulation tissue at the base which will promote quicker healing -Advised patient to call office if she needs a refill of her Santyl -Follow up with Dr. Arnoldo Morale in 2 weeks  All of the above recommendations were discussed with the patient and patient's family, and all of patient's and family's questions were answered to their expressed satisfaction.  Graciella Freer, DO [facility] [address] [place], [place] [address] [phone] (office)

## 2021-12-15 ENCOUNTER — Other Ambulatory Visit: Payer: Medicare Other | Admitting: Primary Care

## 2021-12-15 ENCOUNTER — Other Ambulatory Visit: Payer: Self-pay

## 2021-12-18 ENCOUNTER — Encounter: Payer: Self-pay | Admitting: *Deleted

## 2021-12-18 ENCOUNTER — Ambulatory Visit
Admission: RE | Admit: 2021-12-18 | Discharge: 2021-12-18 | Disposition: A | Discharge status: Home / Self Care | Payer: Medicare Other | Source: Ambulatory Visit | Attending: Family Medicine | Admitting: Family Medicine

## 2021-12-18 DIAGNOSIS — I739 Peripheral vascular disease, unspecified: Secondary | ICD-10-CM

## 2021-12-18 HISTORY — PX: IR RADIOLOGIST EVAL & MGMT: IMG5224

## 2021-12-18 NOTE — Consult Note (Addendum)
Chief Complaint: Right foot wound  Referring Physician(s): Mullis,Kiersten P  Podiatry/wound care: Dr. Harlow Mares, [facility] Surgery: Dr. Aviva Signs  History of Present Illness: Anne [name] is a 41 y.o. female presenting today as a scheduled consultation to [place] [facility], kindly referred by Dr. Tarry Kos, for evaluation of non-healing right foot wound and evaluation for candidacy for possible revascularization.  Anne [name] joins [place] today in the office with her daughter.    She tells me that the right foot wound on the lateral foot has been present for months, having come up last year mid to late year.  This wound preceded the left toe wound that led to her hospitalization/amputation in December.   The right foot wound has been getting cared for by her podiatrist, Dr. Barkley Bruns.  She tells me that she had a prior right trans-met amputation in 2018.  She was admitted at [facility] in December for worsening wound at the left second toe, which she says turned gangrenous, and was treated with primary amputation, ultimately with BKA.    She tells me that she desires to keep as much as the right leg as possible, as she still continues to do things around the house, and anticipates it will be much more difficult for her if she loses her right leg.   She has a history of MI, she says x 2, with most recent 11/10/2018.  She was treated medically, she says with no CABG or PTCI. She denies history of stroke, and denies any resting chest pain.   She is ex-smoker, telling me she quit in 2015.  She smoked from teens for about 20 years, she says less than 1/2ppd.   She has 3 children, 2 daughters who live with her, separated from her husband, and she also lives with her mother.   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 [facility])    CAD (coronary artery disease) 11/13/2018   Late presentation anterior MI 12/19 >> LHC - dLM 25, mLAD 99, oOM2 100 (R-L collats), irreg RCA, EF 25-35 >> PCI: POBA to mLAD   Chronic systolic CHF (congestive heart failure) ([place]) 11/28/2018   Ischemic CM // late presentation ant MI 10/2018 tx with POBA to LAD (residual CAD with CTO of the OM2) // Echo 12/19:  No mural apical thrombus, septal, apical mid ant and inf HK; mild LVH, EF 30-35, mild MR // Echo 01/2019: EF 30-35, Gr 1 DD, diff HK, apical AK, mild MR    CKD (chronic kidney disease), stage I    Diabetes mellitus type 1 ([place])    Dyspnea    Former tobacco use    Gastroparesis    Hypercholesteremia    Hypertension    Myocardial infarction ([place]) 2019   Osteomyelitis ([place])    a. s/p R forefoot amputation.   Renal failure    Sciatica     Past Surgical History:  Procedure Laterality Date   AMPUTATION Right 11/03/2011   Procedure: AMPUTATION RAY;  Surgeon: Jamesetta So;  Location: [facility];  Service: General;  Laterality: Right;  Right fourth and fifth  metatarsal    AMPUTATION Left 10/24/2021   Procedure: AMPUTATION BELOW KNEE;  Surgeon: Aviva Signs, MD;  Location: [facility];  Service: General;  Laterality: Left;   APPENDECTOMY     CARDIAC CATHETERIZATION  10/2018   CESAREAN SECTION  2004 and 2007   x2   CORONARY/GRAFT ACUTE MI REVASCULARIZATION N/A 11/11/2018   Procedure: CORONARY/GRAFT ACUTE MI REVASCULARIZATION;  Surgeon: Jettie Booze, MD;  Location: [place] [facility];  Service: Cardiovascular;  Laterality: N/A;   FRACTURE SURGERY  2000   INJECTION OF SILICONE OIL Right 19/50/9326   Procedure: Injection Of Silicone Oil;  Surgeon: Bernarda Caffey, MD;  Location: [place];  Service: Ophthalmology;  Laterality: Right;   IR RADIOLOGIST EVAL & MGMT  12/18/2021   LEFT HEART CATH  AND CORONARY ANGIOGRAPHY N/A 11/11/2018   Procedure: LEFT HEART CATH AND CORONARY ANGIOGRAPHY;  Surgeon: Jettie Booze, MD;  Location: [place] [facility];  Service: Cardiovascular;  Laterality: N/A;   MEMBRANE PEEL Right 09/14/2019   Procedure: Antoine Primas;  Surgeon: Bernarda Caffey, MD;  Location: [place];  Service: Ophthalmology;  Laterality: Right;   PARS PLANA VITRECTOMY Right 09/14/2019   Procedure: Pars Plana Vitrectomy With 25 Gauge;  Surgeon: Bernarda Caffey, MD;  Location: [place];  Service: Ophthalmology;  Laterality: Right;   PHOTOCOAGULATION WITH LASER Right 09/14/2019   Procedure: Photocoagulation With Laser;  Surgeon: Bernarda Caffey, MD;  Location: [place];  Service: Ophthalmology;  Laterality: Right;   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 Authorizing Provider  acetaminophen (TYLENOL) 500 MG tablet Take 500 mg by mouth every 4 (four) hours as needed.    [provider]  albuterol (VENTOLIN HFA) 108 (90 Base) MCG/ACT inhaler Inhale 2 puffs into the lungs every 6 (six) hours as needed for wheezing or shortness of breath.    [provider]  aspirin (GNP ASPIRIN LOW DOSE) 81 MG EC tablet Take 1 tablet (81 mg total) by mouth daily with breakfast. Swallow whole. 05/04/19   Roxan Hockey, MD  atorvastatin (LIPITOR) 80 MG tablet Take 1 tablet (80 mg total) by mouth every evening. 05/04/19   Emokpae, Courage, MD  BAQSIMI TWO PACK 3 MG/DOSE POWD Place 3 mg into the nose once as needed (for emergency low blood sugar levels).  01/24/20   [provider]  BYDUREON BCISE 2 MG/0.85ML AUIJ Inject 2 mg into the skin every Thursday. 12/26/19   [provider]  carvedilol (COREG) 3.125 MG tablet TAKE 1 TABLET BY MOUTH TWICE DAILY 12/11/21   Fay Records, MD  cyclobenzaprine (FLEXERIL) 10 MG tablet Take 1 tablet (10 mg total) by mouth 3 (three) times daily as needed for muscle spasms. 11/11/21   Aviva Signs, MD  Ensure Max Protein (ENSURE MAX PROTEIN) LIQD Take 330 mLs (11 oz total) by mouth 2 (two) times daily. 10/28/21   Manuella Ghazi, Pratik D, DO  ezetimibe (ZETIA) 10 MG tablet TAKE 1 TABLET BY MOUTH ONCE DAILY. 03/28/21   Fay Records, MD  fenofibrate (TRICOR) 145 MG tablet Take 1 tablet (145 mg total) by mouth daily. 12/26/19   Daune Perch, NP  ferrous sulfate 325 (65 FE) MG tablet Take 1 tablet (  mouth daily. 10/28/21 10/28/22  Shah, Pratik D, DO  °furosemide (LASIX) 40 MG tablet Take 1 tablet (40 mg total) by mouth 2 (two) times daily. 05/04/19 10/18/21  Emokpae, Courage, MD  °isosorbide mononitrate (IMDUR) 30 MG 24 hr tablet TAKE 1 TABLET BY MOUTH ONCE DAILY. 06/20/21   Ross, Paula V, MD  °LANTUS SOLOSTAR 100 UNIT/ML Solostar Pen INNJECT 50 UNITS S.Q. ONCE DAILY AT 10 P.M. °Patient taking differently: Inject 50 Units into the skin at bedtime. 02/03/16   Nida, Gebreselassie W, MD  °losartan (COZAAR) 25 MG tablet Take 1 tablet (25 mg total) by mouth every evening. 05/04/19   Emokpae, Courage, MD  °metFORMIN (GLUCOPHAGE) 1000 MG tablet Take 1,000 mg by mouth 2 (two) times daily with a meal.    [provider]  °metoCLOPramide (REGLAN) 5 MG tablet Take 1 tablet (5 mg total) by mouth 3 (three) times daily before meals. 03/12/20   Madera, Carlos, MD  °nitroGLYCERIN (NITROSTAT) 0.4 MG SL tablet Place 1 tablet (0.4 mg total) under the tongue every 5 (five) minutes as needed. 05/04/19   Emokpae, Courage, MD  °NOVOLOG FLEXPEN 100 UNIT/ML FlexPen INJECT 12-18 UNITS S.Q. THREE TIMES DAILY WITH MEALS. °Patient taking differently: Inject 12-18 Units into the skin 3 (three) times daily with meals. 04/29/16   Nida, Gebreselassie W, MD  °NUZYRA 150 MG  TABS Take by mouth. 12/04/21   [provider]  °ondansetron (ZOFRAN) 4 MG tablet Take 1 tablet (4 mg total) by mouth every 8 (eight) hours as needed for nausea or vomiting. 07/10/21   Mesner, Jason, MD  °oxyCODONE (OXY IR/ROXICODONE) 5 MG immediate release tablet Take 1 tablet (5 mg total) by mouth every 6 (six) hours as needed for moderate pain. 11/27/21   Jenkins, Mark, MD  °pantoprazole (PROTONIX) 40 MG tablet Take 1 tablet (40 mg total) by mouth daily. 03/12/20   Madera, Carlos, MD  °pregabalin (LYRICA) 75 MG capsule Take 1 capsule (75 mg total) by mouth daily. 10/28/21   Shah, Pratik D, DO  °sertraline (ZOLOFT) 100 MG tablet Take 200 mg by mouth at bedtime.     [provider]  °spironolactone (ALDACTONE) 25 MG tablet Take 12.5 mg by mouth daily. 10/15/21   [provider]  °ticagrelor ([name]) 60 MG TABS tablet Take 1 tablet (60 mg total) by mouth 2 (two) times daily. 10/28/21   Shah, Pratik D, DO  °Vitamin D, Ergocalciferol, (DRISDOL) 1.25 MG (50000 UNIT) CAPS capsule Take 50,000 Units by mouth every 7 (seven) days. 06/27/20   [provider]  °  ° °Family History  °Problem Relation Age of Onset  ° Diabetes Father   ° Lung cancer Father   ° Alcoholism Father   ° Asthma Mother   ° Kidney disease Mother   ° Anemia Mother   °     hemolytic  ° Hypertension Mother   ° Heart attack Paternal Grandmother   ° Diabetes Paternal Grandmother   ° Diabetes Paternal Grandfather   ° Anesthesia problems Neg Hx   ° Hypotension Neg Hx   ° Malignant hyperthermia Neg Hx   ° Pseudochol deficiency Neg Hx   ° ° °Social History  ° °Socioeconomic History  ° Marital status: Married  °  Spouse name: Not on file  ° Number of children: 2  ° Years of education: Not on file  ° Highest education level: Not on file  °Occupational History  ° Not on file  °Tobacco Use  ° Smoking status: Former  °    Smoking status: Former    Packs/day: 0.25    Years: 20.00    Pack years: 5.00    Types: Cigarettes    Quit date: 12/29/2013    Years since  quitting: 7.9   Smokeless tobacco: Never  Vaping Use   Vaping Use: Never used  Substance and Sexual Activity   Alcohol use: No   Drug use: No   Sexual activity: Yes    Birth control/protection: Surgical  Other Topics Concern   Not on file  Social History Narrative   Not on file   Social Determinants of Health   Financial Resource Strain: Not on file  Food Insecurity: Not on file  Transportation Needs: Not on file  Physical Activity: Not on file  Stress: Not on file  Social Connections: Not on file      Review of Systems: A 12 point ROS discussed and pertinent positives are indicated in the HPI above.  All other systems are negative.  Review of Systems  Vital Signs: BP (!) 142/81 (BP Location: Right Arm)    Pulse 81    SpO2 97%   Physical Exam General: 41 yo female > stated age.  Well-developed, well-nourished.  No distress. HEENT: Atraumatic, normocephalic.  Glasses. Conjugate gaze, extra-ocular motor intact. No scleral icterus or scleral injection. .  Neck: Symmetric with no goiter enlargement.  Chest/Lungs:  Symmetric chest with inspiration/expiration.  No labored breathing.  Clear to auscultation with no wheezes, rhonchi, or rales.  Heart:  RRR, with no third heart sounds appreciated. No JVD appreciated.  Abdomen:  Soft, NT/ND, with + bowel sounds.   Genito-urinary: Deferred Neurologic: Alert & Oriented to person, place, and time.   Normal affect and insight.  Appropriate questions.  Moving all 4 extremities   Pulse Exam:  No bruit appreciated.  Palpable radial pulses, lower on the left. Doppler positive right AT & PT.  Extremities: Left BKA.  Right trans-met.  The surgery site is healed.  She has a full-thickness wound of the right lateral foot, overlying the 5th metatarsal, ~5-7cm.  No purulent drainage.       Mallampati Score:     Imaging: IR Radiologist Eval & Mgmt  Result Date: 12/18/2021 Please refer to notes tab for details about interventional  procedure. (Op Note)   Labs:  CBC: Recent Labs    10/25/21 1946 10/26/21 0533 10/27/21 0434 10/27/21 1028 10/28/21 0557  WBC 12.4* 12.8* 11.2*  --  10.6*  HGB 8.6* 8.2* 7.2* 7.0* 8.8*  HCT 28.1* 26.5* 22.8* 22.6* 28.3*  PLT 390 373 332  --  355    COAGS: No results for input(s): INR, APTT in the last 8760 hours.  BMP: Recent Labs    10/25/21 1946 10/26/21 0533 10/27/21 0434 10/28/21 0557  NA 134* 135 136 139  K 5.1 4.2 4.0 4.5  CL 99 103 105 105  CO2 _0 GLUCOSE 207* 114* 149* 162*  BUN 21* _1 CALCIUM 8.3* 8.1* 7.9* 8.6*  CREATININE 0.92 0.75 0.76 0.85  GFRNONAA >60 >60 >60 >60    LIVER FUNCTION TESTS: Recent Labs    02/12/21 1417 10/17/21 1848 10/18/21 0201  BILITOT 0.5 1.0 0.5  AST 14* 13* 13*  ALT _2 ALKPHOS 38 77 65  PROT 7.5 9.0* 7.1  ALBUMIN 3.9 3.3* 2.7*    TUMOR MARKERS: No results for input(s): AFPTM, CEA, CA199, CHROMGRNA in the last 8760 hours.  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  ° °Non-invasive lower extremity exam has monophasic waveforms of the AT/PT at the ankle, compatible with proximal disease.  The ABI is likely falsely elevated ° °The SVS WIfI matrix calculation gives a High amputation risk, with High benefit to revascularization, using a surrogate of <0.4 for the ABI, as she clearly has occlusive disease.    ° °I had lengthy discussion with Anne Stayer regarding the anatomy, pathology/pathophysiology, natural history, and prognosis of PAD/CLI.  Informed consent regarding treatment strategies was performed which would possibly include wound care/medical management, surgical strategy, and/or endovascular options, with risk/benefit discussion.  The indications for treatment supported by updated guidelines1, 2 were  discussed.  ° °Regarding medical management, maximal medical therapy for reduction of risk factors is indicated as recommended by updated AHA guidelines1.  This includes anti-platelet medication, tight blood glucose control to a HbA1c < 7, tight blood pressure control, maximum-dose HMG-CoA reductase inhibitor, and smoking cessation.  Smoking cessation was addressed, and she endorses that she has quit as of 2015.   ° °Regarding endovascular options, specific risks discussed include: bleeding, infection, contrast reaction, renal injury/nephropathy, arterial injury/dissection, need for additional procedure/surgery, worsening symptoms/tissue including limb loss, cardiopulmonary collapse, death.   ° °Annual flu vaccination is also recommended, with Class 1 recommendation1.  ° °At the conclusion of our discussion, she understands that we are recommending angiogram and intervention for her in order to get her improved healing, as she has high risk of amputation, and she does wish to keep as much tissue as possible of her right LE, and avoid further amputation.  ° °Plan: °- She understands our recommendation to proceed with aorto-peripheral angiogram and possible intervention, however, she would like to discuss with her family and call us back. We can follow up with her in about a week by phone.  °- If she would like to proceed, we will schedule her for lower extremity angiogram and intervention with Dr. Marvelyn Bouchillon at [facility].  °-Recommend continued maximal medical therapy for cardiovascular risk reduction, including anti-platelet therapy. °- Continued wound care for the right foot wound.  ° ° ° °___________________________________________________________________ ° ° °1- Gerhard-Herman MD, et al. 2016 AHA/ACC Guideline on the Management of Patients With Lower Extremity Peripheral Artery Disease: Executive Summary: A Report of the American College of Cardiology/American Heart Association Task Force on Clinical Practice Guidelines. J  Am Coll Cardiol. 2017 Mar 21;69(11):1465-1508. doi: 10.1016/j.jacc.2016.11.008.  ° °2 - Norgren L, et al. TASC II Working Group. Inter-society consensus for the management of peripheral arterial disease. Int Angiol. 2007 Jun;26(2):81-157. Review. PubMed PMID: 17489079 ° °3 - Hingorani A, et al. The management of diabetic foot: A clinical practice guideline by the Society for Vascular Surgery in collaboration with the American Podiatric Medical Association and the Society  for Vascular Medicine. J Vasc Surg. 2016 Feb;63(2 Suppl):3S-21S. doi: 10.1016/j.jvs.2015.10.003. PubMed PMID: 26804367. ° °4 - Mustapha JA, Saab FA, Martinsen BJ, Pena CS, Zeller T, Driver VR, Neville RF, Lookstein R, van den Berg JC, Jaff MR, Michael P, Henao S, AlMahameed A, Katzen B. Digital Subtraction Angiography Prior to an Amputation for Critical Limb Ischemia (CLI): An Expert Recommendation Statement From the CLI Global Society to Optimize Limb Salvage. J Endovasc Ther. 2020 Aug;27(4):540-546. doi: 10.1177/1526602820928590. Epub 2020 May 29. PMID: 32469294.  ° ° °Thank you for this interesting consult.  I greatly enjoyed meeting Anne [name]   greatly enjoyed meeting Anne [name] and look forward to participating in their care.  A copy of this report was sent to the requesting provider on this date.  Electronically Signed: Corrie Mckusick 12/18/2021, 2:52 PM   I spent a total of  60 Minutes   in face to face in clinical consultation, greater than 50% of which was counseling/coordinating care for right foot diabetic foot wound, possible angiogram and intervention

## 2021-12-25 ENCOUNTER — Ambulatory Visit (INDEPENDENT_AMBULATORY_CARE_PROVIDER_SITE_OTHER): Payer: Medicare Other | Admitting: General Surgery

## 2021-12-25 ENCOUNTER — Other Ambulatory Visit: Payer: Self-pay

## 2021-12-25 ENCOUNTER — Encounter: Payer: Self-pay | Admitting: General Surgery

## 2021-12-25 VITALS — BP 131/85 | HR 87 | Temp 98.7°F | Resp 16 | Ht 69.0 in | Wt 173.0 lb

## 2021-12-25 DIAGNOSIS — Z09 Encounter for follow-up examination after completed treatment for conditions other than malignant neoplasm: Secondary | ICD-10-CM

## 2021-12-25 NOTE — Progress Notes (Signed)
Subjective:     Anne [name]  Patient here for follow-up, status post left BKA.  Patient has been applying Santyl to an eschar on her incision.  She has no complaints.  No purulent drainage noted by the patient. Objective:    BP 131/85    Pulse 87    Temp 98.7 F (37.1 C) (Other (Comment))    Resp 16    Ht 5\' 9"  (1.753 m)    Wt 173 lb (78.5 kg)    SpO2 98%    BMI 25.55 kg/m   General:  alert, cooperative, and no distress  Left BKA wound healing well.  Approximately 1.5 cm superficial wound dehiscence is present.  No erythema or drainage noted.     Assessment:    Doing well postoperatively. Superficial wound dehiscence healing well by secondary intention.    Plan:   Would apply Santyl with a Q-tip to the wound daily.  It is closing nicely.  Follow-up here in 2 weeks.

## 2021-12-30 ENCOUNTER — Telehealth: Payer: Self-pay | Admitting: *Deleted

## 2021-12-30 NOTE — Telephone Encounter (Signed)
Left a msg on machine for [employer] checking to see if she has decided to proceed with aorto peripheral angiogram with Dr. Earleen Newport.Cathren Anne [name]

## 2022-01-04 ENCOUNTER — Emergency Department (HOSPITAL_COMMUNITY)
Admission: EM | Admit: 2022-01-04 | Discharge: 2022-01-04 | Disposition: A | Discharge status: Home / Self Care | Payer: Medicare Other | Attending: Emergency Medicine | Admitting: Emergency Medicine

## 2022-01-04 ENCOUNTER — Encounter (HOSPITAL_COMMUNITY): Payer: Self-pay

## 2022-01-04 ENCOUNTER — Other Ambulatory Visit: Payer: Self-pay

## 2022-01-04 ENCOUNTER — Emergency Department (HOSPITAL_COMMUNITY): Payer: Medicare Other

## 2022-01-04 DIAGNOSIS — I509 Heart failure, unspecified: Secondary | ICD-10-CM | POA: Insufficient documentation

## 2022-01-04 DIAGNOSIS — I130 Hypertensive heart and chronic kidney disease with heart failure and stage 1 through stage 4 chronic kidney disease, or unspecified chronic kidney disease: Secondary | ICD-10-CM | POA: Diagnosis not present

## 2022-01-04 DIAGNOSIS — I2510 Atherosclerotic heart disease of native coronary artery without angina pectoris: Secondary | ICD-10-CM | POA: Insufficient documentation

## 2022-01-04 DIAGNOSIS — Z4800 Encounter for change or removal of nonsurgical wound dressing: Secondary | ICD-10-CM | POA: Diagnosis present

## 2022-01-04 DIAGNOSIS — N183 Chronic kidney disease, stage 3 unspecified: Secondary | ICD-10-CM | POA: Diagnosis not present

## 2022-01-04 DIAGNOSIS — L8990 Pressure ulcer of unspecified site, unspecified stage: Secondary | ICD-10-CM | POA: Insufficient documentation

## 2022-01-04 DIAGNOSIS — E1122 Type 2 diabetes mellitus with diabetic chronic kidney disease: Secondary | ICD-10-CM | POA: Diagnosis not present

## 2022-01-04 DIAGNOSIS — Z5189 Encounter for other specified aftercare: Secondary | ICD-10-CM

## 2022-01-04 DIAGNOSIS — N289 Disorder of kidney and ureter, unspecified: Secondary | ICD-10-CM

## 2022-01-04 LAB — BASIC METABOLIC PANEL
Anion gap: 11 (ref 5–15)
BUN: 35 mg/dL — ABNORMAL HIGH (ref 6–20)
CO2: 25 mmol/L (ref 22–32)
Calcium: 8.9 mg/dL (ref 8.9–10.3)
Chloride: 98 mmol/L (ref 98–111)
Creatinine, Ser: 1.55 mg/dL — ABNORMAL HIGH (ref 0.44–1.00)
GFR, Estimated: 43 mL/min — ABNORMAL LOW (ref >60)
Glucose, Bld: 226 mg/dL — ABNORMAL HIGH (ref 70–99)
Potassium: 3.9 mmol/L (ref 3.5–5.1)
Sodium: 134 mmol/L — ABNORMAL LOW (ref 135–145)

## 2022-01-04 LAB — CBC WITH DIFFERENTIAL/PLATELET
Abs Immature Granulocytes: 0.01 10*3/uL (ref 0.00–0.07)
Basophils Absolute: 0.1 10*3/uL (ref 0.0–0.1)
Basophils Relative: 1 %
Eosinophils Absolute: 0.3 10*3/uL (ref 0.0–0.5)
Eosinophils Relative: 5 %
HCT: 34 % — ABNORMAL LOW (ref 36.0–46.0)
Hemoglobin: 11.3 g/dL — ABNORMAL LOW (ref 12.0–15.0)
Immature Granulocytes: 0 %
Lymphocytes Relative: 24 %
Lymphs Abs: 1.6 10*3/uL (ref 0.7–4.0)
MCH: 28.9 pg (ref 26.0–34.0)
MCHC: 33.2 g/dL (ref 30.0–36.0)
MCV: 87 fL (ref 80.0–100.0)
Monocytes Absolute: 0.3 10*3/uL (ref 0.1–1.0)
Monocytes Relative: 4 %
Neutro Abs: 4.3 10*3/uL (ref 1.7–7.7)
Neutrophils Relative %: 66 %
Platelets: 261 10*3/uL (ref 150–400)
RBC: 3.91 MIL/uL (ref 3.87–5.11)
RDW: 16.4 % — ABNORMAL HIGH (ref 11.5–15.5)
WBC: 6.6 10*3/uL (ref 4.0–10.5)
nRBC: 0 % (ref 0.0–0.2)

## 2022-01-04 MED ORDER — CEPHALEXIN 500 MG PO CAPS: 500.0000 mg | ORAL_CAPSULE | Freq: Four times a day (QID) | ORAL | 0 refills | Status: DC

## 2022-01-04 MED ORDER — HYDROCODONE-ACETAMINOPHEN 5-325 MG PO TABS: ORAL_TABLET | ORAL | 0 refills | Status: AC

## 2022-01-04 NOTE — ED Triage Notes (Signed)
Pt came to ED to have her wound checked. She is on ABT for her foot but was told to come to ED for drainage. Drainage noted since yesterday. Right foot wound bandage is changed daily.

## 2022-01-04 NOTE — ED Provider Notes (Signed)
[facility] EMERGENCY DEPARTMENT Provider Note   CSN: [id number] Arrival date & time: 01/04/22  1125     History  Chief Complaint  Patient presents with   Wound Check    Anne [name] is a 41 y.o. female.   Wound Check       Anne [name] is a 41 y.o. 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  She has also had a partial amputation of distal right foot performed in 2012. She presents to the Emergency Department requesting evaluation of a nonhealing wound to the lateral aspect of her right foot.  She is currently followed by podiatry for this.  She is on an unknown antibiotic at this time.  She states that she has also been using Santyl ointment recently.  She noticed increasing pain and redness at the wound site.  No recent injury.  She denies any fevers or chills.  She is concerned about possible recurrent osteomyelitis.  She denies any radiating pain or pain to her lower leg.   Home Medications Prior to Admission medications   Medication Sig Start Date End Date Taking? Authorizing Provider  acetaminophen (TYLENOL) 500 MG tablet Take 500 mg by mouth every 4 (four) hours as needed.    [provider]  albuterol (VENTOLIN HFA) 108 (90 Base) MCG/ACT inhaler Inhale 2 puffs into the lungs every 6 (six) hours as needed for wheezing or shortness of breath.    [provider]  aspirin (GNP ASPIRIN LOW DOSE) 81 MG EC tablet Take 1 tablet (81 mg total) by mouth daily with breakfast. Swallow whole. 05/04/19   Roxan Hockey, MD  atorvastatin (LIPITOR) 80 MG tablet Take 1 tablet (80 mg total) by mouth every evening. 05/04/19   Emokpae, Courage, MD  BAQSIMI TWO PACK 3 MG/DOSE POWD Place 3 mg into the nose once as needed (for emergency low blood sugar levels).  01/24/20   [provider]   BYDUREON BCISE 2 MG/0.85ML AUIJ Inject 2 mg into the skin every Thursday. 12/26/19   [provider]  carvedilol (COREG) 3.125 MG tablet TAKE 1 TABLET BY MOUTH TWICE DAILY 12/11/21   Fay Records, MD  cyclobenzaprine (FLEXERIL) 10 MG tablet Take 1 tablet (10 mg total) by mouth 3 (three) times daily as needed for muscle spasms. 11/11/21   Aviva Signs, MD  Ensure Max Protein (ENSURE MAX PROTEIN) LIQD Take 330 mLs (11 oz total) by mouth 2 (two) times daily. 10/28/21   Manuella Ghazi, Pratik D, DO  ezetimibe (ZETIA) 10 MG tablet TAKE 1 TABLET BY MOUTH ONCE DAILY. 03/28/21   Fay Records, MD  fenofibrate (TRICOR) 145 MG tablet Take 1 tablet (145 mg total) by mouth daily. 12/26/19   Daune Perch, NP  ferrous sulfate 325 (65 FE) MG tablet Take 1 tablet (325 mg total) by mouth daily. 10/28/21 10/28/22  Manuella Ghazi, Pratik D, DO  furosemide (LASIX) 40 MG tablet Take 1 tablet (40 mg total) by mouth 2 (two) times daily. 05/04/19 10/18/21  Roxan Hockey, MD  isosorbide mononitrate (IMDUR) 30 MG 24 hr tablet TAKE 1 TABLET BY MOUTH ONCE DAILY. 06/20/21   Fay Records, MD  LANTUS SOLOSTAR 100 UNIT/ML Solostar Pen INNJECT 50 UNITS S.Q. ONCE DAILY AT 10 P.M. Patient taking differently: Inject 50 Units into the skin at bedtime. 02/03/16   Cassandria Anger, MD  losartan (COZAAR) 25  MG tablet Take 1 tablet (25 mg total) by mouth every evening. 05/04/19   Denton Brick, Courage, MD  metFORMIN (GLUCOPHAGE) 1000 MG tablet Take 1,000 mg by mouth 2 (two) times daily with a meal.    [provider]  metoCLOPramide (REGLAN) 5 MG tablet Take 1 tablet (5 mg total) by mouth 3 (three) times daily before meals. 03/12/20   Barton Dubois, MD  nitroGLYCERIN (NITROSTAT) 0.4 MG SL tablet Place 1 tablet (0.4 mg total) under the tongue every 5 (five) minutes as needed. 05/04/19   Emokpae, Courage, MD  NOVOLOG FLEXPEN 100 UNIT/ML FlexPen INJECT 12-18 UNITS S.Q. THREE TIMES DAILY WITH MEALS. Patient taking differently: Inject 12-18 Units into  the skin 3 (three) times daily with meals. 04/29/16   Cassandria Anger, MD  NUZYRA 150 MG TABS Take by mouth. 12/04/21   [provider]  ondansetron (ZOFRAN) 4 MG tablet Take 1 tablet (4 mg total) by mouth every 8 (eight) hours as needed for nausea or vomiting. 07/10/21   Mesner, Corene Cornea, MD  oxyCODONE (OXY IR/ROXICODONE) 5 MG immediate release tablet Take 1 tablet (5 mg total) by mouth every 6 (six) hours as needed for moderate pain. 11/27/21   Aviva Signs, MD  pantoprazole (PROTONIX) 40 MG tablet Take 1 tablet (40 mg total) by mouth daily. 03/12/20   Barton Dubois, MD  pregabalin (LYRICA) 75 MG capsule Take 1 capsule (75 mg total) by mouth daily. 10/28/21   Manuella Ghazi, Pratik D, DO  sertraline (ZOLOFT) 100 MG tablet Take 200 mg by mouth at bedtime.     [provider]  spironolactone (ALDACTONE) 25 MG tablet Take 12.5 mg by mouth daily. 10/15/21   [provider]  ticagrelor ([name]) 60 MG TABS tablet Take 1 tablet (60 mg total) by mouth 2 (two) times daily. 10/28/21   Manuella Ghazi, Pratik D, DO  Vitamin D, Ergocalciferol, (DRISDOL) 1.25 MG (50000 UNIT) CAPS capsule Take 50,000 Units by mouth every 7 (seven) days. 06/27/20   [provider]      Allergies    Canagliflozin and Nsaids    Review of Systems   Review of Systems  Constitutional:  Negative for chills and fever.  Musculoskeletal:  Positive for arthralgias (Right foot pain).  Skin:  Positive for color change and wound (Nonhealing wound lateral right foot).  Neurological:  Negative for weakness and numbness.  All other systems reviewed and are negative.  Physical Exam Updated Vital Signs BP 129/83 (BP Location: Right Arm)    Pulse 92    Temp 98.1 F (36.7 C) (Oral)    Resp 16    Ht 5\' 9"  (1.753 m)    Wt 79.4 kg    SpO2 99%    BMI 25.84 kg/m  Physical Exam Vitals and nursing note reviewed.  Constitutional:      Appearance: Normal appearance. She is not toxic-appearing.  Cardiovascular:     Rate and Rhythm:  Normal rate and regular rhythm.     Pulses: Normal pulses.  Pulmonary:     Effort: Pulmonary effort is normal.  Abdominal:     Palpations: Abdomen is soft.     Tenderness: There is no abdominal tenderness.  Musculoskeletal:        General: Tenderness present. No swelling.     Comments: Prior transmetatarsal amputation of the toes of the right foot.  2 cm ulceration to the lateral aspect of the foot.  Mild surrounding erythema without lymphangitis.  No edema.  No significant drainage.  Some granulation  tissue is present.  Skin:    General: Skin is warm.     Capillary Refill: Capillary refill takes less than 2 seconds.  Neurological:     General: No focal deficit present.     Mental Status: She is alert.     Sensory: No sensory deficit.     Motor: No weakness.    ED Results / Procedures / Treatments   Labs (all labs ordered are listed, but only abnormal results are displayed) Labs Reviewed  CBC WITH DIFFERENTIAL/PLATELET - Abnormal; Notable for the following components:      Result Value   Hemoglobin 11.3 (*)    HCT 34.0 (*)    RDW 16.4 (*)    All other components within normal limits  BASIC METABOLIC PANEL - Abnormal; Notable for the following components:   Sodium 134 (*)    Glucose, Bld 226 (*)    BUN 35 (*)    Creatinine, Ser 1.55 (*)    GFR, Estimated 43 (*)    All other components within normal limits    EKG None  Radiology DG Foot Complete Right  Result Date: 01/04/2022 CLINICAL DATA:  Pain, nonhealing wound EXAM: RIGHT FOOT COMPLETE - 3+ VIEW COMPARISON:  None. FINDINGS: Status post transmetatarsal amputation of the right forefoot. Soft tissue ulceration overlying the base of the right fifth metatarsal. Soft tissue edema about the amputation stump. IMPRESSION: 1.  Status post transmetatarsal amputation of the right forefoot. 2. Soft tissue ulceration overlying the base of the right fifth metatarsal. No obvious underlying bony erosion or sclerosis to suggest  osteomyelitis, however MRI is the most sensitive test for the detection of bone marrow edema and osteomyelitis. 3.  Soft tissue edema about the amputation stump. Electronically Signed   By: Delanna Ahmadi M.D.   On: 01/04/2022 14:13    Procedures Procedures    Medications Ordered in ED Medications - No data to display  ED Course/ Medical Decision Making/ A&P                           Medical Decision Making Patient here for evaluation of nonhealing wound of her lateral right foot.  States that she is concerned about increasing infection as she has had a recent left BKA.  She has been using Santyl ointment on the wound and endorses increased redness and pain since starting the ointment.  Amount and/or Complexity of Data Reviewed Labs: ordered.    Details: Labs interpreted by me, no leukocytosis, chemistry show blood sugar of 226, some renal dysfunction with BUN elevated at 35, serum creatinine at 1.55.  This is slightly above her baseline, GFR of 43..  she does endorse stage III CKD. Radiology: ordered.    Details: X-ray of the right foot without evidence of fracture or osteomyelitis.  I have reviewed imaging and agree with radiology interpretation.  Risk Prescription drug management.   Patient here with concerns for worsening infection of a nonhealing ulceration to the right foot.  X-ray is without evidence for osteomyelitis.  Overall the wound does not appear acutely infected.  There is some surrounding erythema which may be related to inflammation, patient is a diabetic.  I feel it is reasonable to start her on antibiotic.  I will have her discontinue the Santyl ointment as this may be causing some irritation.  Short course of pain medication prescribed, database reviewed.  She will follow-up closely with podiatry.  Her kidney functions suggest some mild  degree of dehydration as she has elevation of her BUN as well as her creatinine.  I have recommended to increase fluid intake for several  days and have PCP to recheck her kidney functions.  She is agreeable to this plan.  I feel she is appropriate for discharge home return precautions were discussed.        Final Clinical Impression(s) / ED Diagnoses Final diagnoses:  Visit for wound check  Kidney disease    Rx / DC Orders ED Discharge Orders     None         Kem Parkinson, PA-C 01/04/22 2319    Fredia Sorrow, MD 01/08/22 2351

## 2022-01-04 NOTE — Discharge Instructions (Signed)
Please stop using the Santyl ointment.  Keep the area bandaged.  Start taking the antibiotic as directed.  Follow-up with your podiatrist later this week for recheck.  Your kidney functions today were slightly worse than your baseline.  This could be from some dehydration.  I recommend that you increase your water intake.  You will need to have your kidney functions rechecked later this week.  Return emergency department for any new or worsening symptoms.

## 2022-01-04 NOTE — ED Notes (Signed)
ED Provider at bedside. 

## 2022-01-08 ENCOUNTER — Ambulatory Visit (INDEPENDENT_AMBULATORY_CARE_PROVIDER_SITE_OTHER): Payer: Medicare Other | Admitting: General Surgery

## 2022-01-08 ENCOUNTER — Telehealth: Payer: Self-pay

## 2022-01-08 ENCOUNTER — Encounter: Payer: Self-pay | Admitting: General Surgery

## 2022-01-08 ENCOUNTER — Other Ambulatory Visit: Payer: Self-pay

## 2022-01-08 VITALS — BP 106/67 | HR 86 | Temp 97.1°F | Resp 18 | Ht 69.0 in | Wt 173.0 lb

## 2022-01-08 DIAGNOSIS — Z09 Encounter for follow-up examination after completed treatment for conditions other than malignant neoplasm: Secondary | ICD-10-CM

## 2022-01-08 NOTE — Progress Notes (Signed)
Subjective:     Anne [name]  Patient here for wound check, status post left below the knee amputation.  She has been keeping the wound clean and dry.  She is to be fitted for prosthesis tomorrow. Objective:    BP 106/67    Pulse 86    Temp (!) 97.1 F (36.2 C) (Other (Comment))    Resp 18    Ht 5\' 9"  (1.753 m)    Wt 173 lb (78.5 kg)    SpO2 96%    BMI 25.55 kg/m   General:  alert, cooperative, and no distress  Left BKA incision with small 5 mm eschar present without significant wound separation.  No induration or erythema is noted.  No drainage is noted.     Assessment:    Doing well postoperatively. Patient has healed her left BKA    Plan:   Okay for prosthesis sizing and placement.  Follow-up here as needed.

## 2022-01-08 NOTE — Telephone Encounter (Signed)
Spoke with patient and rescheduled Palliative Care consult for Mychart on 01/13/22 @ 2PM.

## 2022-01-13 ENCOUNTER — Other Ambulatory Visit: Payer: Self-pay

## 2022-01-13 ENCOUNTER — Encounter: Payer: Self-pay | Admitting: Nurse Practitioner

## 2022-01-13 ENCOUNTER — Telehealth: Payer: Medicare Other | Admitting: Nurse Practitioner

## 2022-01-13 DIAGNOSIS — Z515 Encounter for palliative care: Secondary | ICD-10-CM

## 2022-01-13 DIAGNOSIS — R5381 Other malaise: Secondary | ICD-10-CM
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
## 2022-01-14 ENCOUNTER — Encounter (INDEPENDENT_AMBULATORY_CARE_PROVIDER_SITE_OTHER): Payer: Medicare Other | Admitting: Nurse Practitioner

## 2022-02-12 ENCOUNTER — Telehealth: Payer: Self-pay | Admitting: Nurse Practitioner

## 2022-02-12 ENCOUNTER — Other Ambulatory Visit: Payer: Medicare Other | Admitting: Nurse Practitioner

## 2022-02-12 ENCOUNTER — Other Ambulatory Visit: Payer: Self-pay

## 2022-02-12 NOTE — Telephone Encounter (Signed)
I called [name] for telemedicine telehealth f/u pc visit, no answer, message left with contact information ?

## 2022-02-13 ENCOUNTER — Other Ambulatory Visit: Payer: Medicare Other | Admitting: Nurse Practitioner

## 2022-03-24 ENCOUNTER — Telehealth: Payer: Self-pay | Admitting: Primary Care

## 2022-03-24 NOTE — Telephone Encounter (Signed)
Spoke with patient and have scheduled a MyChart Palliative f/u visit for 04/02/22 @ 2:30 PM.  ?

## 2022-03-30 ENCOUNTER — Other Ambulatory Visit: Payer: Self-pay | Admitting: Internal Medicine

## 2022-04-01 ENCOUNTER — Ambulatory Visit (INDEPENDENT_AMBULATORY_CARE_PROVIDER_SITE_OTHER): Payer: Medicare Other | Admitting: Internal Medicine

## 2022-04-01 ENCOUNTER — Encounter: Payer: Self-pay | Admitting: Internal Medicine

## 2022-04-01 VITALS — BP 120/80 | HR 97 | Ht 69.0 in | Wt 188.2 lb

## 2022-04-01 DIAGNOSIS — I2510 Atherosclerotic heart disease of native coronary artery without angina pectoris: Secondary | ICD-10-CM | POA: Diagnosis not present

## 2022-04-01 MED ORDER — FUROSEMIDE 40 MG PO TABS
40.0000 mg | ORAL_TABLET | Freq: Two times a day (BID) | ORAL | 1 refills | Status: DC
Start: 2022-04-01 — End: 2024-02-10

## 2022-04-01 MED ORDER — NITROGLYCERIN 0.4 MG SL SUBL: 0.4000 mg | SUBLINGUAL_TABLET | SUBLINGUAL | 12 refills | Status: AC | PRN

## 2022-04-01 NOTE — Progress Notes (Signed)
? ?Cardiology Office Note ? ? ?Date:  04/01/2022  ? ?ID:  Anne [name], DOB 09/18/1981, MRN [id number] ? ?PCP:  Danna Hefty, DO  ?Cardiologist:   Dorris Carnes, MD  ? ?Pt presents for F/U of CAD   ? ?  ?History of Present Illness: ?Anne [name] is a 41 y.o. female with a history of GERD, anxiety, OA, CKD, Type II DM, gastroparesis, HL, HTN, CAD (anterior MI in 201912/19 >> LHC - dLM 25, mLAD 99, oOM2 100 (R-L collats), irreg RCA, EF 25-35 >> PCI: PCI to mLAD), ischemic cardiovmyopathy   Dressler syndrome ?She has been seen by Beckie Salts in past for possible ICD   No plans made ?The pt underwent L BKA in De 2022   Echo during admit showed LVEF mildly depressed.    ?I saw the pt in Dec 2022 ?SInce seen she says she is doing pretty good   Denies CP   Breathing is OK    ?Has cut back on carbs   A1C much improved    ? ?Current Meds  ?Medication Sig  ? acetaminophen (TYLENOL) 500 MG tablet Take 500 mg by mouth every 4 (four) hours as needed.  ? albuterol (VENTOLIN HFA) 108 (90 Base) MCG/ACT inhaler Inhale 2 puffs into the lungs every 6 (six) hours as needed for wheezing or shortness of breath.  ? aspirin (GNP ASPIRIN LOW DOSE) 81 MG EC tablet Take 1 tablet (81 mg total) by mouth daily with breakfast. Swallow whole.  ? atorvastatin (LIPITOR) 80 MG tablet Take 1 tablet (80 mg total) by mouth every evening.  ? BAQSIMI TWO PACK 3 MG/DOSE POWD Place 3 mg into the nose once as needed (for emergency low blood sugar levels).   ? BYDUREON BCISE 2 MG/0.85ML AUIJ Inject 2 mg into the skin every Thursday.  ? carvedilol (COREG) 3.125 MG tablet TAKE 1 TABLET BY MOUTH TWICE DAILY  ? cyclobenzaprine (FLEXERIL) 10 MG tablet Take 1 tablet (10 mg total) by mouth 3 (three) times daily as needed for muscle spasms.  ? Ensure Max Protein (ENSURE MAX PROTEIN) LIQD Take 330 mLs (11 oz total) by mouth 2 (two) times daily.  ? ezetimibe (ZETIA) 10 MG tablet Take 1 tablet (10 mg total) by mouth daily. Please keep upcoming appointment for  future refills. Thank you  ? fenofibrate (TRICOR) 145 MG tablet Take 1 tablet (145 mg total) by mouth daily.  ? ferrous sulfate 325 (65 FE) MG tablet Take 1 tablet (325 mg total) by mouth daily.  ? furosemide (LASIX) 40 MG tablet Take 1 tablet (40 mg total) by mouth 2 (two) times daily.  ? HYDROcodone-acetaminophen (NORCO/VICODIN) 5-325 MG tablet Take one tab po q 4 hrs prn pain  ? Insulin Glargine (BASAGLAR KWIKPEN [place]) Inject 41 Units into the skin at bedtime.  ? isosorbide mononitrate (IMDUR) 30 MG 24 hr tablet TAKE 1 TABLET BY MOUTH ONCE DAILY.  ? losartan (COZAAR) 25 MG tablet Take 1 tablet (25 mg total) by mouth every evening.  ? metFORMIN (GLUCOPHAGE) 1000 MG tablet Take 1,000 mg by mouth 2 (two) times daily with a meal.  ? metoCLOPramide (REGLAN) 5 MG tablet Take 1 tablet (5 mg total) by mouth 3 (three) times daily before meals.  ? nitroGLYCERIN (NITROSTAT) 0.4 MG SL tablet Place 1 tablet (0.4 mg total) under the tongue every 5 (five) minutes as needed.  ? NOVOLOG FLEXPEN 100 UNIT/ML FlexPen INJECT 12-18 UNITS S.Q. THREE TIMES DAILY WITH MEALS. (Patient taking differently: Inject  12-18 Units into the skin 3 (three) times daily with meals.)  ? ondansetron (ZOFRAN) 4 MG tablet Take 1 tablet (4 mg total) by mouth every 8 (eight) hours as needed for nausea or vomiting.  ? pantoprazole (PROTONIX) 40 MG tablet Take 1 tablet (40 mg total) by mouth daily.  ? pregabalin (LYRICA) 75 MG capsule Take 1 capsule (75 mg total) by mouth daily.  ? sertraline (ZOLOFT) 100 MG tablet Take 200 mg by mouth at bedtime.   ? spironolactone (ALDACTONE) 25 MG tablet Take 12.5 mg by mouth daily.  ? ticagrelor ([name]) 60 MG TABS tablet Take 1 tablet (60 mg total) by mouth 2 (two) times daily.  ? Vitamin D, Ergocalciferol, (DRISDOL) 1.25 MG (50000 UNIT) CAPS capsule Take 50,000 Units by mouth every 7 (seven) days.  ? ? ? ?Allergies:   Canagliflozin and Nsaids  ? ?Past Medical History:  ?Diagnosis Date  ? Acid reflux   ? Anxiety   ?  Arthritis   ? 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 CAD (coronary artery disease) 11/13/2018  ? Late presentation anterior MI 12/19 >> LHC - dLM 25, mLAD 99, oOM2 100 (R-L collats), irreg RCA, EF 25-35 >> PCI: POBA to mLAD  ? Chronic systolic CHF (congestive heart failure) ([place]) 11/28/2018  ? Ischemic CM // late presentation ant MI 10/2018 tx with POBA to LAD (residual CAD with CTO of the OM2) // Echo 12/19:  No mural apical thrombus, septal, apical mid ant and inf HK; mild LVH, EF 30-35, mild MR // Echo 01/2019: EF 30-35, Gr 1 DD, diff HK, apical AK, mild MR   ? CKD (chronic kidney disease), stage I   ? Diabetes mellitus type 1 ([place])   ? Dyspnea   ? Former tobacco use   ? Gastroparesis   ? Hypercholesteremia   ? Hypertension   ? Myocardial infarction [facility]) 2019  ? Osteomyelitis ([place])   ? a. s/p R forefoot amputation.  ? Renal failure   ? Sciatica   ? ? ?Past Surgical History:  ?Procedure Laterality Date  ? AMPUTATION Right 11/03/2011  ? Procedure: AMPUTATION RAY;  Surgeon: Jamesetta So;  [facility];  Service: General;  Laterality: Right;  Right fourth and fifth metatarsal   ? AMPUTATION Left 10/24/2021  ? Procedure: AMPUTATION BELOW KNEE;  Surgeon: Aviva Signs, MD;  [facility];  Service: General;  Laterality: Left;  ? APPENDECTOMY    ? CARDIAC CATHETERIZATION  10/2018  ? CESAREAN SECTION  2004 and 2007  ? x2  ? CORONARY/GRAFT ACUTE MI REVASCULARIZATION N/A 11/11/2018  ? Procedure: CORONARY/GRAFT ACUTE MI REVASCULARIZATION;  Surgeon: Jettie Booze, MD;  Location: [place] [facility];  Service: Cardiovascular;  Laterality: N/A;  ? FRACTURE SURGERY  2000  ? INJECTION OF SILICONE OIL Right 51/88/4166  ? Procedure: Injection Of Silicone Oil;  Surgeon: Bernarda Caffey, MD;  Location: [place];  Service: Ophthalmology;  Laterality: Right;  ? IR RADIOLOGIST EVAL & MGMT  12/18/2021  ? LEFT HEART CATH AND CORONARY ANGIOGRAPHY N/A 11/11/2018  ? Procedure: LEFT HEART CATH AND  CORONARY ANGIOGRAPHY;  Surgeon: Jettie Booze, MD;  Location: [place] [facility];  Service: Cardiovascular;  Laterality: N/A;  ? MEMBRANE PEEL Right 09/14/2019  ? Procedure: Antoine Primas;  Surgeon: Bernarda Caffey, MD;  Location: [place];  Service: Ophthalmology;  Laterality: Right;  ? PARS PLANA VITRECTOMY Right 09/14/2019  ? Procedure: Pars Plana Vitrectomy With 25  Gauge;  Surgeon: Bernarda Caffey, MD;  Location: [place];  Service: Ophthalmology;  Laterality: Right;  ? PHOTOCOAGULATION WITH LASER Right 09/14/2019  ? Procedure: Photocoagulation With Laser;  Surgeon: Bernarda Caffey, MD;  Location: [place];  Service: Ophthalmology;  Laterality: Right;  ? 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  ? Procedure: REPAIR OF COMPLEX TRACTION RETINAL DETACHMENT;  Surgeon: Bernarda Caffey, MD;  Location: [place];  Service: Ophthalmology;  Laterality: Right;  ? TUBAL LIGATION    ? ? ? ?Social History:  The patient  reports that she quit smoking about 8 years ago. Her smoking use included cigarettes. She has a 5.00 pack-year smoking history. She has never used smokeless tobacco. She reports that she does not currently use alcohol. She reports that she does not currently use drugs after having used the following drugs: Marijuana.  ? ?Family History:  The patient's family history includes Alcoholism in her father; Anemia in her mother; Asthma in her mother; Diabetes in her father, paternal grandfather, and paternal grandmother; Heart attack in her paternal grandmother; Hypertension in her mother; Kidney disease in her mother; Lung cancer in her father.  ? ? ?ROS:  Please see the history of present illness. All other systems are reviewed and  Negative to the above problem except as noted.  ? ? ?PHYSICAL EXAM: ?VS:  BP 120/80   Pulse 97   Ht '5\' 9"'$  (1.753 m)   Wt 188 lb 3.2 oz (85.4 kg)   SpO2 97%   BMI 27.79 kg/m?   ?GEN: Well nourished, well developed, in no acute distress   Examined in chair   ? ?HEENT: normal   ?Neck: no JVD ?Cardiac: RRR; no murmurs ,no LE edema  ?Respiratory:  clear to auscultation bilaterally ?GI: soft, nontender,  ?MS: s/p R transmet amputation   s/p L BKA  ?Skin: warm and dry, no rash ?Neuro:  Strength

## 2022-04-01 NOTE — Patient Instructions (Signed)

## 2022-04-02 ENCOUNTER — Telehealth: Payer: Medicare Other | Admitting: Primary Care

## 2022-04-22 ENCOUNTER — Other Ambulatory Visit: Payer: Medicare Other | Admitting: Primary Care

## 2022-04-22 DIAGNOSIS — Z515 Encounter for palliative care: Secondary | ICD-10-CM

## 2022-04-22 NOTE — Progress Notes (Signed)
    [facility] Palliative [facility] Consult Note Telephone: [phone]  Fax: [phone]    Date of encounter: 04/22/22 Arrival time 3:35 pm  PATIENT NAME: Anne [name] [address] [place] [address]   [phone] (home)  DOB: 06/27/1981 MRN: [id number] PRIMARY CARE PROVIDER:    Danna Hefty, DO,  [address] [place] [address] [phone]  REFERRING PROVIDER:   Danna Hefty, DO [address] [place],  [place] [address] [phone]  RESPONSIBLE PARTY:    Contact Information     Name Relation Home Work [place] Mother [phone]        Arrived at patient home, adolescent answered  door saying patient was not at home. I advised him she could call back for another appointment if desired.    Follow up Palliative Care Visit: Palliative care will continue to follow for complex medical decision making, advance care planning, and clarification of goals. Return  or prn.   Thank you for the opportunity to participate in the care of [name].  The palliative care team will continue to follow. Please call our office at [phone] if we can be of additional assistance.   Jason Coop, NP ,   COVID-19 PATIENT SCREENING TOOL Asked and negative response unless otherwise noted:   Have you had symptoms of covid, tested positive or been in contact with someone with symptoms/positive test in the past 5-10 days?

## 2022-05-18 ENCOUNTER — Ambulatory Visit: Payer: Medicare Other | Admitting: Internal Medicine

## 2022-05-18 NOTE — Progress Notes (Deleted)
Name: Anne [name]  MRN/ DOB: [id number], 1981/04/19   Age/ Sex: 41 y.o., female    PCP: Danna Hefty, DO   Reason for Endocrinology Evaluation: Type 2 Diabetes Mellitus     Date of Initial Endocrinology Visit: 05/18/2022     PATIENT IDENTIFIER: Anne [name] is a 41 y.o. female with a past medical history of T2Dm,  and Dyslipidemia. The patient presented for initial endocrinology clinic visit on 05/18/2022 for consultative assistance with her diabetes management.    HPI: Ms. Ferrante was    Diagnosed with DM in 2012 Prior Medications tried/Intolerance: *** Currently checking blood sugars *** x / day,  before breakfast and ***.  Hypoglycemia episodes : ***               Symptoms: ***                 Frequency: ***/  Hemoglobin A1c has ranged from 7.0% in 2019, peaking at 12.9% in 2022. Patient required assistance for hypoglycemia:  Patient has required hospitalization within the last 1 year from hyper or hypoglycemia:   In terms of diet, the patient ***   HOME DIABETES REGIMEN: Metformin 1000 mg  Novolog  Basaglar  Bydureon     Statin: yes ACE-I/ARB: no Prior Diabetic Education: {Yes/No:11203}   METER DOWNLOAD SUMMARY: Date range evaluated: *** Fingerstick Blood Glucose Tests = *** Average Number Tests/Day = *** Overall Mean FS Glucose = *** Standard Deviation = ***  BG Ranges: Low = *** High = ***   Hypoglycemic Events/30 Days: BG < 50 = *** Episodes of symptomatic severe hypoglycemia = ***   DIABETIC COMPLICATIONS: Microvascular complications:  *** Denies: CKD Last eye exam: Completed   Macrovascular complications:   Denies: CAD, PVD, CVA   PAST HISTORY: Past Medical History:  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 ([place])    CAD (coronary artery disease) 11/13/2018   Late presentation anterior MI 12/19 >> LHC - dLM 25, mLAD 99,  oOM2 100 (R-L collats), irreg RCA, EF 25-35 >> PCI: POBA to mLAD   Chronic systolic CHF (congestive heart failure) ([place]) 11/28/2018   Ischemic CM // late presentation ant MI 10/2018 tx with POBA to LAD (residual CAD with CTO of the OM2) // Echo 12/19:  No mural apical thrombus, septal, apical mid ant and inf HK; mild LVH, EF 30-35, mild MR // Echo 01/2019: EF 30-35, Gr 1 DD, diff HK, apical AK, mild MR    CKD (chronic kidney disease), stage I    Diabetes mellitus type 1 ([place])    Dyspnea    Former tobacco use    Gastroparesis    Hypercholesteremia    Hypertension    Myocardial infarction ([place]) 2019   Osteomyelitis ([place])    a. s/p R forefoot amputation.   Renal failure    Sciatica    Past Surgical History:  Past Surgical History:  Procedure Laterality Date   AMPUTATION Right 11/03/2011   Procedure: AMPUTATION RAY;  Surgeon: Jamesetta So;  [facility];  Service: General;  Laterality: Right;  Right fourth and fifth metatarsal    AMPUTATION Left 10/24/2021   Procedure: AMPUTATION BELOW KNEE;  Surgeon: Aviva Signs, MD;  [facility];  Service: General;  Laterality: Left;   APPENDECTOMY     CARDIAC CATHETERIZATION  10/2018  CESAREAN SECTION  2004 and 2007   x2   CORONARY/GRAFT ACUTE MI REVASCULARIZATION N/A 11/11/2018   Procedure: CORONARY/GRAFT ACUTE MI REVASCULARIZATION;  Surgeon: Jettie Booze, MD;  Location: [place] [facility];  Service: Cardiovascular;  Laterality: N/A;   FRACTURE SURGERY  2000   INJECTION OF SILICONE OIL Right 24/58/0998   Procedure: Injection Of Silicone Oil;  Surgeon: Bernarda Caffey, MD;  Location: [place];  Service: Ophthalmology;  Laterality: Right;   IR RADIOLOGIST EVAL & MGMT  12/18/2021   LEFT HEART CATH AND CORONARY ANGIOGRAPHY N/A 11/11/2018   Procedure: LEFT HEART CATH AND CORONARY ANGIOGRAPHY;  Surgeon: Jettie Booze, MD;  Location: [place] [facility];  Service: Cardiovascular;  Laterality: N/A;   MEMBRANE PEEL Right 09/14/2019    Procedure: Antoine Primas;  Surgeon: Bernarda Caffey, MD;  Location: [place];  Service: Ophthalmology;  Laterality: Right;   PARS PLANA VITRECTOMY Right 09/14/2019   Procedure: Pars Plana Vitrectomy With 25 Gauge;  Surgeon: Bernarda Caffey, MD;  Location: [place];  Service: Ophthalmology;  Laterality: Right;   PHOTOCOAGULATION WITH LASER Right 09/14/2019   Procedure: Photocoagulation With Laser;  Surgeon: Bernarda Caffey, MD;  Location: [place];  Service: Ophthalmology;  Laterality: Right;   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   Procedure: REPAIR OF COMPLEX TRACTION RETINAL DETACHMENT;  Surgeon: Bernarda Caffey, MD;  Location: [place];  Service: Ophthalmology;  Laterality: Right;   TUBAL LIGATION      Social History:  reports that she quit smoking about 8 years ago. Her smoking use included cigarettes. She has a 5.00 pack-year smoking history. She has never used smokeless tobacco. She reports that she does not currently use alcohol. She reports that she does not currently use drugs after having used the following drugs: Marijuana. Family History:  Family History  Problem Relation Age of Onset   Diabetes Father    Lung cancer Father    Alcoholism Father    Asthma Mother    Kidney disease Mother    Anemia Mother        hemolytic   Hypertension Mother    Heart attack Paternal Grandmother    Diabetes Paternal Grandmother    Diabetes Paternal Grandfather    Anesthesia problems Neg Hx    Hypotension Neg Hx    Malignant hyperthermia Neg Hx    Pseudochol deficiency Neg Hx      HOME MEDICATIONS: Allergies as of 05/18/2022       Reactions   Canagliflozin Other (See Comments)   Vaginal yeast infections   Nsaids Other (See Comments)   Yeast infection        Medication List        Accurate as of May 18, 2022  7:03 AM. If you have any questions, ask your nurse or doctor.          acetaminophen 500 MG tablet Commonly known as: TYLENOL Take 500 mg by mouth every 4  (four) hours as needed.   albuterol 108 (90 Base) MCG/ACT inhaler Commonly known as: VENTOLIN HFA Inhale 2 puffs into the lungs every 6 (six) hours as needed for wheezing or shortness of breath.   aspirin EC 81 MG tablet Commonly known as: GNP Aspirin Low Dose Take 1 tablet (81 mg total) by mouth daily with breakfast. Swallow whole.   atorvastatin 80 MG tablet Commonly known as: LIPITOR Take 1 tablet (80 mg total) by mouth every evening.   Baqsimi Two Pack 3 MG/DOSE Powd Generic drug: Glucagon  Place 3 mg into the nose once as needed (for emergency low blood sugar levels).   BASAGLAR KWIKPEN [place] Inject 41 Units into the skin at bedtime.   Bydureon BCise 2 MG/0.85ML [place] Generic drug: Exenatide ER Inject 2 mg into the skin every Thursday.   carvedilol 3.125 MG tablet Commonly known as: COREG TAKE 1 TABLET BY MOUTH TWICE DAILY   cyclobenzaprine 10 MG tablet Commonly known as: FLEXERIL Take 1 tablet (10 mg total) by mouth 3 (three) times daily as needed for muscle spasms.   Ensure Max Protein Liqd Take 330 mLs (11 oz total) by mouth 2 (two) times daily.   ezetimibe 10 MG tablet Commonly known as: ZETIA Take 1 tablet (10 mg total) by mouth daily. Please keep upcoming appointment for future refills. Thank you   fenofibrate 145 MG tablet Commonly known as: Tricor Take 1 tablet (145 mg total) by mouth daily.   ferrous sulfate 325 (65 FE) MG tablet Take 1 tablet (325 mg total) by mouth daily.   furosemide 40 MG tablet Commonly known as: LASIX Take 1 tablet (40 mg total) by mouth 2 (two) times daily.   HYDROcodone-acetaminophen 5-325 MG tablet Commonly known as: NORCO/VICODIN Take one tab po q 4 hrs prn pain   isosorbide mononitrate 30 MG 24 hr tablet Commonly known as: IMDUR TAKE 1 TABLET BY MOUTH ONCE DAILY.   losartan 25 MG tablet Commonly known as: COZAAR Take 1 tablet (25 mg total) by mouth every evening.   metFORMIN 1000 MG tablet Commonly known as:  GLUCOPHAGE Take 1,000 mg by mouth 2 (two) times daily with a meal.   metoCLOPramide 5 MG tablet Commonly known as: REGLAN Take 1 tablet (5 mg total) by mouth 3 (three) times daily before meals.   nitroGLYCERIN 0.4 MG SL tablet Commonly known as: Nitrostat Place 1 tablet (0.4 mg total) under the tongue every 5 (five) minutes as needed.   NovoLOG FlexPen 100 UNIT/ML FlexPen Generic drug: insulin aspart INJECT 12-18 UNITS S.Q. THREE TIMES DAILY WITH MEALS. What changed: See the new instructions.   ondansetron 4 MG tablet Commonly known as: ZOFRAN Take 1 tablet (4 mg total) by mouth every 8 (eight) hours as needed for nausea or vomiting.   pantoprazole 40 MG tablet Commonly known as: Protonix Take 1 tablet (40 mg total) by mouth daily.   pregabalin 75 MG capsule Commonly known as: LYRICA Take 1 capsule (75 mg total) by mouth daily.   sertraline 100 MG tablet Commonly known as: ZOLOFT Take 200 mg by mouth at bedtime.   spironolactone 25 MG tablet Commonly known as: ALDACTONE Take 12.5 mg by mouth daily.   ticagrelor 60 MG Tabs tablet Commonly known as: [name] Take 1 tablet (60 mg total) by mouth 2 (two) times daily.   Vitamin D (Ergocalciferol) 1.25 MG (50000 UNIT) Caps capsule Commonly known as: DRISDOL Take 50,000 Units by mouth every 7 (seven) days.         ALLERGIES: Allergies  Allergen Reactions   Canagliflozin Other (See Comments)    Vaginal yeast infections   Nsaids Other (See Comments)    Yeast infection      REVIEW OF SYSTEMS: A comprehensive ROS was conducted with the patient and is negative except as per HPI and below:  ROS    OBJECTIVE:   VITAL SIGNS: There were no vitals taken for this visit.   PHYSICAL EXAM:  General: Pt appears well and is in NAD  Hydration: Well-hydrated with moist mucous membranes and  good skin turgor  HEENT: Head: Unremarkable with good dentition. Oropharynx clear without exudate.  Eyes: External eye exam normal  without stare, lid lag or exophthalmos.  EOM intact.  PERRL.  Neck: General: Supple without adenopathy or carotid bruits. Thyroid: Thyroid size normal.  No goiter or nodules appreciated. No thyroid bruit.  Lungs: Clear with good BS bilat with no rales, rhonchi, or wheezes  Heart: RRR with normal S1 and S2 and no gallops; no murmurs; no rub  Abdomen: Normoactive bowel sounds, soft, nontender, without masses or organomegaly palpable  Extremities:  Lower extremities - No pretibial edema. No lesions.  Skin: Normal texture and temperature to palpation. No rash noted. No Acanthosis nigricans/skin tags. No lipohypertrophy.  Neuro: MS is good with appropriate affect, pt is alert and Ox3    DM foot exam:    DATA REVIEWED:  Lab Results  Component Value Date   HGBA1C 12.9 (H) 10/18/2021   HGBA1C 12.7 (H) 10/17/2021   HGBA1C 9.5 (H) 03/08/2020   Lab Results  Component Value Date   LDLCALC 30 06/04/2020   CREATININE 1.55 (H) 01/04/2022   No results found for: "MICRALBCREAT"  Lab Results  Component Value Date   CHOL 93 (L) 06/04/2020   HDL 21 (L) 06/04/2020   LDLCALC 30 06/04/2020   LDLDIRECT 98 02/10/2012   TRIG 277 (H) 06/04/2020   CHOLHDL 4.4 06/04/2020        ASSESSMENT / PLAN / RECOMMENDATIONS:   1) Type *** Diabetes Mellitus, ***controlled, With*** complications - Most recent A1c of *** %. Goal A1c < *** %.  ***  Plan: GENERAL: ***  MEDICATIONS: ***  EDUCATION / INSTRUCTIONS: BG monitoring instructions: Patient is instructed to check her blood sugars *** times a day, ***. Call [facility] if: BG persistently < 70 or > 300. I reviewed the Rule of 15 for the treatment of hypoglycemia in detail with the patient. Literature supplied.   2) Diabetic complications:  Eye: Does *** have known diabetic retinopathy.  Neuro/ Feet: Does *** have known diabetic peripheral neuropathy. Renal: Patient does *** have known baseline CKD. She is *** on an ACEI/ARB at  present.Check urine albumin/creatinine ratio yearly starting at time of diagnosis. If albuminuria is positive, treatment is geared toward better glucose, blood pressure control and use of ACE inhibitors or ARBs. Monitor electrolytes and creatinine once to twice yearly.   3) Lipids: Patient is *** on a statin.    4) Hypertension: ***  at goal of < 140/90 mmHg.       Signed electronically by: Mack Guise, MD  [facility]  [facility] [address] [place], [place] [address] Phone: [phone] FAX: [phone]   CC: Danna Hefty, DO [address] [place] [address] Phone: [phone]  Fax: [phone]    Return to Endocrinology clinic as below: Future Appointments  Date Time Provider [place]  05/18/2022 10:30 AM Gram Siedlecki, Melanie Crazier, MD [id number] None  10/09/2022  1:00 PM Fay Records, MD [facility] [place] [facility]

## 2022-06-22 ENCOUNTER — Other Ambulatory Visit: Payer: Self-pay | Admitting: Internal Medicine

## 2022-09-21 ENCOUNTER — Encounter (INDEPENDENT_AMBULATORY_CARE_PROVIDER_SITE_OTHER): Payer: Self-pay

## 2022-10-08 NOTE — Progress Notes (Signed)
Cardiology Office Note   Date:  10/09/2022   ID:  Anne [name], DOB 1981-11-04, MRN [id number]  PCP:  Danna Hefty, DO  Cardiologist:   Dorris Carnes, MD   Pt presents for F/U of CAD      History of Present Illness: Anne [name] is a 41 y.o. female with a history of GERD, anxiety, OA, CKD, Type II DM, gastroparesis, HL, HTN, CAD (anterior MI in 201912/19 >> LHC - dLM 25, mLAD 99, oOM2 100 (R-L collats), irreg RCA, EF 25-35 >> PCI: PCI to mLAD), ischemic cardiovmyopathy   Dressler syndrome She has been seen by Beckie Salts in past for possible ICD   No plans made The pt underwent L BKA in De 2022   Echo during admit showed LVEF mildly depressed.   I saw the pt in [place] in May 2023  Since seen she has rare episodes of CP   Breathing is OK  Does get dizzy with standing    Current Meds  Medication Sig   aspirin (GNP ASPIRIN LOW DOSE) 81 MG EC tablet Take 1 tablet (81 mg total) by mouth daily with breakfast. Swallow whole.   atorvastatin (LIPITOR) 80 MG tablet Take 1 tablet (80 mg total) by mouth every evening.   buPROPion (WELLBUTRIN XL) 150 MG 24 hr tablet Take 150 mg by mouth every morning.   carvedilol (COREG) 3.125 MG tablet TAKE 1 TABLET BY MOUTH TWICE DAILY   Cholecalciferol (VITAMIN D) 125 MCG (5000 UT) CAPS Take 1 capsule by mouth daily in the afternoon.   cyanocobalamin (VITAMIN B12) 1000 MCG tablet Take 1,000 mcg by mouth daily.   Ensure Max Protein (ENSURE MAX PROTEIN) LIQD Take 330 mLs (11 oz total) by mouth 2 (two) times daily.   ezetimibe (ZETIA) 10 MG tablet Take 1 tablet (10 mg total) by mouth daily. Please keep upcoming appointment for future refills. Thank you   fenofibrate (TRICOR) 145 MG tablet Take 1 tablet (145 mg total) by mouth daily.   ferrous sulfate 325 (65 FE) MG tablet Take 1 tablet (325 mg total) by mouth daily.   furosemide (LASIX) 40 MG tablet Take 1 tablet (40 mg total) by mouth 2 (two) times daily.   Insulin Glargine (BASAGLAR KWIKPEN [place])  Inject 41 Units into the skin at bedtime.   lisinopril (ZESTRIL) 2.5 MG tablet Take 2.5 mg by mouth daily.   losartan (COZAAR) 25 MG tablet Take 1 tablet (25 mg total) by mouth every evening.   metFORMIN (GLUCOPHAGE) 1000 MG tablet Take 1,000 mg by mouth 2 (two) times daily with a meal.   metoCLOPramide (REGLAN) 5 MG tablet Take 1 tablet (5 mg total) by mouth 3 (three) times daily before meals.   MOUNJARO 2.5 MG/0.5ML Pen Inject 2.5 mg into the skin once a week.   nitroGLYCERIN (NITROSTAT) 0.4 MG SL tablet Place 1 tablet (0.4 mg total) under the tongue every 5 (five) minutes as needed.   NOVOLOG FLEXPEN 100 UNIT/ML FlexPen INJECT 12-18 UNITS S.Q. THREE TIMES DAILY WITH MEALS. (Patient taking differently: Inject 12-18 Units into the skin 3 (three) times daily with meals.)   NUZYRA 150 MG TABS Take 2 tablets by mouth daily.   pantoprazole (PROTONIX) 40 MG tablet Take 1 tablet (40 mg total) by mouth daily.   pregabalin (LYRICA) 75 MG capsule Take 1 capsule (75 mg total) by mouth daily.   promethazine (PHENERGAN) 25 MG tablet Take 25 mg by mouth 2 (two) times daily as needed.  SANTYL 250 UNIT/GM ointment Apply topically every other day.   sertraline (ZOLOFT) 100 MG tablet Take 200 mg by mouth at bedtime.    ticagrelor ([name]) 60 MG TABS tablet Take 1 tablet (60 mg total) by mouth 2 (two) times daily.   ULTICARE MINI PEN NEEDLES 31G X 6 MM MISC See admin instructions.     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 [facility])    CAD (coronary artery disease) 11/13/2018   Late presentation anterior MI 12/19 >> LHC - dLM 25, mLAD 99, oOM2 100 (R-L collats), irreg RCA, EF 25-35 >> PCI: POBA to mLAD   Chronic systolic CHF (congestive heart failure) ([place]) 11/28/2018   Ischemic CM // late presentation ant MI 10/2018 tx with POBA to LAD (residual CAD with CTO of the  OM2) // Echo 12/19:  No mural apical thrombus, septal, apical mid ant and inf HK; mild LVH, EF 30-35, mild MR // Echo 01/2019: EF 30-35, Gr 1 DD, diff HK, apical AK, mild MR    CKD (chronic kidney disease), stage I    Diabetes mellitus type 1 ([place])    Dyspnea    Former tobacco use    Gastroparesis    Hypercholesteremia    Hypertension    Myocardial infarction ([place]) 2019   Osteomyelitis ([place])    a. s/p R forefoot amputation.   Renal failure    Sciatica     Past Surgical History:  Procedure Laterality Date   AMPUTATION Right 11/03/2011   Procedure: AMPUTATION RAY;  Surgeon: Jamesetta So;  [facility];  Service: General;  Laterality: Right;  Right fourth and fifth metatarsal    AMPUTATION Left 10/24/2021   Procedure: AMPUTATION BELOW KNEE;  Surgeon: Aviva Signs, MD;  Location: [facility];  Service: General;  Laterality: Left;   APPENDECTOMY     CARDIAC CATHETERIZATION  10/2018   CESAREAN SECTION  2004 and 2007   x2   CORONARY/GRAFT ACUTE MI REVASCULARIZATION N/A 11/11/2018   Procedure: CORONARY/GRAFT ACUTE MI REVASCULARIZATION;  Surgeon: Jettie Booze, MD;  Location: [place] [facility];  Service: Cardiovascular;  Laterality: N/A;   FRACTURE SURGERY  2000   INJECTION OF SILICONE OIL Right 69/67/8938   Procedure: Injection Of Silicone Oil;  Surgeon: Bernarda Caffey, MD;  Location: [place];  Service: Ophthalmology;  Laterality: Right;   IR RADIOLOGIST EVAL & MGMT  12/18/2021   LEFT HEART CATH AND CORONARY ANGIOGRAPHY N/A 11/11/2018   Procedure: LEFT HEART CATH AND CORONARY ANGIOGRAPHY;  Surgeon: Jettie Booze, MD;  Location: [place] CV [facility];  Service: Cardiovascular;  Laterality: N/A;   MEMBRANE PEEL Right 09/14/2019   Procedure: Antoine Primas;  Surgeon: Bernarda Caffey, MD;  Location: [place];  Service: Ophthalmology;  Laterality: Right;   PARS PLANA VITRECTOMY Right 09/14/2019   Procedure: Pars Plana Vitrectomy With 25 Gauge;  Surgeon: Bernarda Caffey, MD;  Location: [place];   Service: Ophthalmology;  Laterality: Right;   PHOTOCOAGULATION WITH LASER Right 09/14/2019   Procedure: Photocoagulation With Laser;  Surgeon: Bernarda Caffey, MD;  Location: [facility];  Service: Ophthalmology;  Laterality: Right;   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 Her smoking use included cigarettes. She has a 5.00 pack-year smoking history. She has never used smokeless tobacco. She reports that she does not currently use alcohol. She reports that she does not currently use drugs after having used the following drugs: Marijuana.   Family History:  The patient's family history includes Alcoholism in her father; Anemia in her mother; Asthma in her mother; Diabetes in her father, paternal grandfather, and paternal grandmother; Heart attack in her paternal grandmother; Hypertension in her mother; Kidney disease in her mother; Lung cancer in her father.    ROS:  Please see the history of present illness. All other systems are reviewed and  Negative to the above problem except as noted.    PHYSICAL EXAM: VS:  BP (!) 150/90 (BP Location: Left Arm, Patient Position: Sitting, Cuff Size: Normal)   Pulse 97   Ht '5\' 10"'$  (1.778 m)   Wt 186 lb (84.4 kg)   SpO2 97%   BMI 26.69 kg/m     GEN: Well nourished, well developed, in no acute distress   Examined in chair   HEENT: normal  Neck: no JVD Cardiac: RRR; no murmurs  No RLE edema  Respiratory:  clear to auscultation bilaterally GI: soft, nontender,  MS: s/p R transmet amputation   s/p L BKA  Skin: warm and dry, no rash Neuro:  Strength and sensation are intact Psych: euthymic mood, full affect   EKG:  EKG is ordered today.  SR 97  LAFB  Echo  10/18/21 Hypokinesis of  the mid-apical anterolateral, apical inferolatera, apical anterior, and apical myocardium. Left ventricular ejection fraction, by estimation, is 45 to 50%. The left ventricle has mildly decreased function. The left ventricle demonstrates regional wall motion abnormalities (see scoring diagram/findings for description). Left ventricular diastolic parameters are consistent with Grade II diastolic dysfunction (pseudonormalization). 2. Right ventricular systolic function is normal. The right ventricular size is normal. The mitral valve is normal in structure. No evidence of mitral valve regurgitation. No evidence of mitral stenosis. 3. The aortic valve is tricuspid. Aortic valve regurgitation is not visualized. No aortic stenosis is present. 4. The inferior vena cava is normal in size with greater than 50% respiratory variability, suggesting right atrial pressure of 3 mmHg.  Lipid Panel    Component Value Date/Time   CHOL 93 (L) 06/04/2020 0923   TRIG 277 (H) 06/04/2020 0923   HDL 21 (L) 06/04/2020 0923   CHOLHDL 4.4 06/04/2020 0923   CHOLHDL 6.8 11/11/2018 1304   VLDL 33 11/11/2018 1304   LDLCALC 30 06/04/2020 0923   LDLDIRECT 98 02/10/2012 1230    Prior cardiac studies  The following studies were reviewed today:   Echo 11/13/18 Study Conclusions   - Left ventricle: No mural apical thrombus with definity. septal   apical mid anterior and inferior hypokinesis preserved basal   function The cavity size was moderately dilated. Wall thickness   was increased in a pattern of mild LVH. Systolic function was   moderately to severely reduced. The estimated ejection fraction   was in the range of 30% to 35%. - Mitral valve: There was mild regurgitation. - Atrial septum: No defect or patent foramen ovale was identified.   LEFT HEART CATH AND CORONARY ANGIOGRAPHY  CORONARY/GRAFT ACUTE MI REVASCULARIZATION 11/11/18     Conclusion  Ost 2nd Mrg to 2nd Mrg lesion is 100% stenosed.  Likely  chronic occlusion with right to left collaterals. Mid LAD lesion is 99% stenosed. PTCA with 2.0 mm balloon performed. Post intervention, there is a 25% residual stenosis. Dist LM lesion is 25% stenosed. There is severe left ventricular systolic dysfunction. The left ventricular ejection fraction is 25-35% by visual estimate. LV end diastolic pressure is mildly elevated. There is no aortic valve stenosis.          Wt Readings from Last 3 Encounters:  10/09/22 186 lb (84.4 kg)  04/01/22 188 lb 3.2 oz (85.4 kg)  01/08/22 173 lb (78.5 kg)      ASSESSMENT AND PLAN:  1  CAD  Pt with severe dz noted above   No symptoms of angina   Keep on same regimen   2  HFmrEF   Volume status is OK      Follow   3  HL  Will get labs today   4  HTN  BP is elevated    Need to clarify what she is on  Contact pharmacy     5  DM   Will check A1C today   Current medicines are reviewed at length with the patient today.  The patient does not have concerns regarding medicines.  Signed, Dorris Carnes, MD  10/09/2022 1:33 PM    [facility] [place], [place], [place]  [address] Phone: [phone]; Fax: [phone]

## 2022-10-09 ENCOUNTER — Encounter: Payer: Self-pay | Admitting: Internal Medicine

## 2022-10-09 ENCOUNTER — Ambulatory Visit (INDEPENDENT_AMBULATORY_CARE_PROVIDER_SITE_OTHER): Payer: Medicare Other | Admitting: Internal Medicine

## 2022-10-09 ENCOUNTER — Other Ambulatory Visit (HOSPITAL_COMMUNITY)
Admission: RE | Admit: 2022-10-09 | Discharge: 2022-10-09 | Disposition: A | Discharge status: Home / Self Care | Payer: Medicare Other | Source: Ambulatory Visit | Attending: Internal Medicine | Admitting: Internal Medicine

## 2022-10-09 ENCOUNTER — Ambulatory Visit: Payer: Medicare Other | Admitting: Internal Medicine

## 2022-10-09 VITALS — BP 150/90 | HR 97 | Ht 70.0 in | Wt 186.0 lb

## 2022-10-09 DIAGNOSIS — Z79899 Other long term (current) drug therapy: Secondary | ICD-10-CM | POA: Insufficient documentation

## 2022-10-09 DIAGNOSIS — E1169 Type 2 diabetes mellitus with other specified complication: Secondary | ICD-10-CM | POA: Insufficient documentation

## 2022-10-09 DIAGNOSIS — I10 Essential (primary) hypertension: Secondary | ICD-10-CM | POA: Insufficient documentation

## 2022-10-09 DIAGNOSIS — E1159 Type 2 diabetes mellitus with other circulatory complications: Secondary | ICD-10-CM | POA: Insufficient documentation

## 2022-10-09 DIAGNOSIS — I2510 Atherosclerotic heart disease of native coronary artery without angina pectoris: Secondary | ICD-10-CM

## 2022-10-09 DIAGNOSIS — E785 Hyperlipidemia, unspecified: Secondary | ICD-10-CM

## 2022-10-09 LAB — COMPREHENSIVE METABOLIC PANEL
ALT: 15 U/L (ref 0–44)
AST: 15 U/L (ref 15–41)
Albumin: 3.9 g/dL (ref 3.5–5.0)
Alkaline Phosphatase: 56 U/L (ref 38–126)
Anion gap: 9 (ref 5–15)
BUN: 18 mg/dL (ref 6–20)
CO2: 23 mmol/L (ref 22–32)
Calcium: 8.8 mg/dL — ABNORMAL LOW (ref 8.9–10.3)
Chloride: 98 mmol/L (ref 98–111)
Creatinine, Ser: 1.08 mg/dL — ABNORMAL HIGH (ref 0.44–1.00)
GFR, Estimated: >60 mL/min (ref >60)
Glucose, Bld: 415 mg/dL — ABNORMAL HIGH (ref 70–99)
Potassium: 4.2 mmol/L (ref 3.5–5.1)
Sodium: 130 mmol/L — ABNORMAL LOW (ref 135–145)
Total Bilirubin: 0.8 mg/dL (ref 0.3–1.2)
Total Protein: 7.3 g/dL (ref 6.5–8.1)

## 2022-10-09 LAB — CBC
HCT: 39.7 % (ref 36.0–46.0)
Hemoglobin: 14 g/dL (ref 12.0–15.0)
MCH: 30.3 pg (ref 26.0–34.0)
MCHC: 35.3 g/dL (ref 30.0–36.0)
MCV: 85.9 fL (ref 80.0–100.0)
Platelets: 248 10*3/uL (ref 150–400)
RBC: 4.62 MIL/uL (ref 3.87–5.11)
RDW: 12.9 % (ref 11.5–15.5)
WBC: 6.5 10*3/uL (ref 4.0–10.5)
nRBC: 0 % (ref 0.0–0.2)

## 2022-10-09 LAB — HEMOGLOBIN A1C
Hgb A1c MFr Bld: 9.7 % — ABNORMAL HIGH (ref 4.8–5.6)
Mean Plasma Glucose: 231.69 mg/dL

## 2022-10-09 NOTE — Patient Instructions (Signed)
Medication Instructions:  Your physician recommends that you continue on your current medications as directed. Please refer to the Current Medication list given to you today.  *If you need a refill on your cardiac medications before your next appointment, please call your pharmacy*   Lab Work: Your physician recommends that you return for lab work in: Today   If you have labs (blood work) drawn today and your tests are completely normal, you will receive your results only by: MyChart Message (if you have MyChart) OR A paper copy in the mail If you have any lab test that is abnormal or we need to change your treatment, we will call you to review the results.   Testing/Procedures: NONE    Follow-Up: At [facility], you and your health needs are our priority.  As part of our continuing mission to provide you with exceptional heart care, we have created designated Provider Care Teams.  These Care Teams include your primary Cardiologist (physician) and Advanced Practice Providers (APPs -  Physician Assistants and Nurse Practitioners) who all work together to provide you with the care you need, when you need it.  We recommend signing up for the patient portal called "MyChart".  Sign up information is provided on this After Visit Summary.  MyChart is used to connect with patients for Virtual Visits (Telemedicine).  Patients are able to view lab/test results, encounter notes, upcoming appointments, etc.  Non-urgent messages can be sent to your provider as well.   To learn more about what you can do with MyChart, go to [email or web address].    Your next appointment:    July   The format for your next appointment:   In Person  Provider:   Dorris Carnes, MD    Other Instructions Thank you for choosing [facility]!    Important Information About Sugar

## 2022-10-11 LAB — NMR, LIPOPROFILE
Cholesterol, Total: 228 mg/dL — ABNORMAL HIGH (ref 100–199)
HDL Cholesterol by NMR: 27 mg/dL — ABNORMAL LOW (ref >39)
HDL Particle Number: 20.2 umol/L — ABNORMAL LOW (ref >=30.5)
LDL Particle Number: 2150 nmol/L — ABNORMAL HIGH (ref <1000)
LDL Size: 19.8 nm — ABNORMAL LOW (ref >20.5)
LDL-C (NIH Calc): 125 mg/dL — ABNORMAL HIGH (ref 0–99)
LP-IR Score: 92 — ABNORMAL HIGH (ref <=45)
Small LDL Particle Number: 1639 nmol/L — ABNORMAL HIGH (ref <=527)
Triglycerides by NMR: 427 mg/dL — ABNORMAL HIGH (ref 0–149)

## 2022-10-14 ENCOUNTER — Telehealth: Payer: Self-pay

## 2022-10-14 DIAGNOSIS — E1169 Type 2 diabetes mellitus with other specified complication: Secondary | ICD-10-CM

## 2022-10-14 NOTE — Telephone Encounter (Signed)
Patient notified and verbalized understanding. PCP copied.   

## 2022-10-14 NOTE — Telephone Encounter (Signed)
Fay Records, MD 10/13/2022  4:20 PM EST     CBC is normal Sodium is a little low   Don't just drink plain water     Combine with tea, nonsugared drinks Get BMET in 2 wks   A1C is better than before   Still too high   9. 7   Limit sugars   Forward to Dr Tarry Kos   LDL is 125   Needs to be lower   On lipitor   Would refer for Repatha  Follow up lipomed and liver panel in 2 months

## 2022-10-16 NOTE — Telephone Encounter (Signed)
Patien seen in clinic recenly Medications that she is taking were to be confirmed Her BP was a little high in clinic  REcomm It looks like she is on lisinopril and losartan I would stop lisinopril I would increase losartan to 100 mg daily Follow up in clinic for BP check in 1 monh   Get BMET at hat time

## 2022-11-05 ENCOUNTER — Telehealth: Payer: Self-pay | Admitting: *Deleted

## 2022-11-05 NOTE — Telephone Encounter (Signed)
Spoke with pt who states that she is not taking Losartan at this time. Pt states that PCP put her on Lisinopril to protect her kidneys. Pt states that she was sick at prior visit and would prefer to wait a month before changing medications. Pt will monitor BP and report to office. Please advise.

## 2023-05-07 ENCOUNTER — Other Ambulatory Visit: Payer: Self-pay | Admitting: Internal Medicine

## 2023-07-29 ENCOUNTER — Other Ambulatory Visit: Payer: Self-pay | Admitting: Internal Medicine

## 2023-09-09 ENCOUNTER — Telehealth: Payer: Self-pay | Admitting: Internal Medicine

## 2023-09-09 MED ORDER — TICAGRELOR 60 MG PO TABS: 60.0000 mg | ORAL_TABLET | Freq: Two times a day (BID) | ORAL | 0 refills | Status: DC

## 2023-09-09 NOTE — Telephone Encounter (Signed)
Refilled as requested,has f/u apt this month

## 2023-09-09 NOTE — Telephone Encounter (Signed)
*  STAT* If patient is at the pharmacy, call can be transferred to refill team.   1. Which medications need to be refilled? (please list name of each medication and dose if known) ticagrelor ([name]) 60 MG TABS tablet    2. Would you like to learn more about the convenience, safety, & potential cost savings by using the [facility]? No      3. Are you open to using the [facility] (Type [facility]. No ).   4. Which pharmacy/location (including street and city if local pharmacy) is medication to be sent to? [facility] - [place], [place] - [address]    5. Do they need a 30 day or 90 day supply? 90

## 2023-09-14 ENCOUNTER — Other Ambulatory Visit: Payer: Self-pay | Admitting: Internal Medicine

## 2023-09-14 NOTE — Telephone Encounter (Signed)
This is a [place] patient.

## 2023-09-20 NOTE — Progress Notes (Deleted)
Pt is a no show for appt

## 2023-09-22 ENCOUNTER — Ambulatory Visit: Payer: Medicare Other | Admitting: Internal Medicine

## 2023-12-11 ENCOUNTER — Other Ambulatory Visit: Payer: Self-pay | Admitting: Internal Medicine

## 2023-12-13 ENCOUNTER — Other Ambulatory Visit: Payer: Self-pay

## 2023-12-13 MED ORDER — TICAGRELOR 60 MG PO TABS: 60.0000 mg | ORAL_TABLET | Freq: Two times a day (BID) | ORAL | 0 refills | Status: DC

## 2023-12-19 NOTE — Progress Notes (Deleted)
Cardiology Office Note   Date:  12/19/2023   ID:  Anne [name], DOB [birth year], MRN [id number]  PCP:  Joana Reamer, DO  Cardiologist:   Dietrich Pates, MD   Pt presents for F/U of CAD      History of Present Illness: Anne [name] is a 43 y.o. female with a history of GERD, anxiety, OA, CKD, Type II DM, gastroparesis, HL, HTN, CAD (anterior MI in 201912/19 >> LHC - dLM 25, mLAD 99, oOM2 100 (R-L collats), irreg RCA, EF 25-35 >> PCI: PCI to mLAD), ischemic cardiovmyopathy   Dressler syndrome She has been seen by Rosette Reveal in past for possible ICD   No plans made The pt underwent L BKA in De 2022   Echo during admit showed LVEF mildly depressed.   I saw the pt in clinc in May 2023  Since seen she has rare episodes of CP   Breathing is OK  Does get dizzy with standing    I saw the pt in clinic in Nov 2023  No outpatient medications have been marked as taking for the 12/21/23 encounter (Appointment) with Pricilla Riffle, MD.     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 [facility])    CAD (coronary artery disease) 11/13/2018   Late presentation anterior MI 12/19 >> LHC - dLM 25, mLAD 99, oOM2 100 (R-L collats), irreg RCA, EF 25-35 >> PCI: POBA to mLAD   Chronic systolic CHF (congestive heart failure) ([facility]) 11/28/2018   Ischemic CM // late presentation ant MI 10/2018 tx with POBA to LAD (residual CAD with CTO of the OM2) // Echo 12/19:  No mural apical thrombus, septal, apical mid ant and inf HK; mild LVH, EF 30-35, mild MR // Echo 01/2019: EF 30-35, Gr 1 DD, diff HK, apical AK, mild MR    CKD (chronic kidney disease), stage I    Diabetes mellitus type 1 ([facility])    Dyspnea    Former tobacco use    Gastroparesis    Hypercholesteremia    Hypertension    Myocardial infarction ([facility]) 2019   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    Renal failure    Sciatica     Past Surgical History:  Procedure Laterality Date   AMPUTATION Right 11/03/2011   Procedure: AMPUTATION RAY;  Surgeon: Dalia Heading;  [facility];  Service: General;  Laterality: Right;  Right fourth and fifth metatarsal    AMPUTATION Left 10/24/2021   Procedure: AMPUTATION BELOW KNEE;  Surgeon: Franky Macho, MD;  [facility];  Service: General;  Laterality: Left;   APPENDECTOMY     CARDIAC CATHETERIZATION  10/2018   CESAREAN SECTION  2004 and 2007   x2   CORONARY/GRAFT ACUTE MI REVASCULARIZATION N/A 11/11/2018   Procedure: CORONARY/GRAFT ACUTE MI REVASCULARIZATION;  Surgeon: Corky Crafts, MD;  Location: [facility] INVASIVE CV LAB;  Service: Cardiovascular;  Laterality: N/A;   FRACTURE SURGERY  2000   INJECTION OF SILICONE OIL Right 09/14/2019   Procedure: Injection Of Silicone Oil;  Surgeon: Rennis Chris, MD;  [facility];  Service: Ophthalmology;  Laterality: Right;   IR RADIOLOGIST EVAL & MGMT  12/18/2021   LEFT HEART CATH AND CORONARY ANGIOGRAPHY N/A 11/11/2018   Procedure: LEFT HEART CATH  AND CORONARY ANGIOGRAPHY;  Surgeon: Corky Crafts, MD;  Location: [facility] INVAS[facility] CV LAB;  Service: Cardiovascular;  Laterality: N/A;   MEMBRANE PEEL Right 09/14/2019   Procedure: Eula Flax;  Surgeon: Rennis Chris, MD;  Location: [facility] OR;  Service: Ophthalmology;  Laterality: Right;   PARS PLANA VITRECTOMY Right 09/14/2019   Procedure: Pars Plana Vitrectomy With 25 Gauge;  Surgeon: Rennis Chris, MD;  Location: [facility] OR;  Service: Ophthalmology;  Laterality: Right;   PHOTOCOAGULATION WITH LASER Right 09/14/2019   Procedure: Photocoagulation With Laser;  Surgeon: Rennis Chris, MD;  Location: [facility] OR;  Service: Ophthalmology;  Laterality: Right;   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   Procedure: REPAIR OF COMPLEX TRACTION RETINAL DETACHMENT;  Surgeon: Rennis Chris, MD;  Location: [facility] OR;  Service: Ophthalmology;  Laterality:  Right;   TUBAL LIGATION       Social History:  The patient  reports that she quit smoking about 9 years ago. Her smoking use included cigarettes. She started smoking about 29 years ago. She has a 5 pack-year smoking history. She has never used smokeless tobacco. She reports that she does not currently use alcohol. She reports that she does not currently use drugs after having used the following drugs: Marijuana.   Family History:  The patient's family history includes Alcoholism in her father; Anemia in her mother; Asthma in her mother; Diabetes in her father, paternal grandfather, and paternal grandmother; Heart attack in her paternal grandmother; Hypertension in her mother; Kidney disease in her mother; Lung cancer in her father.    ROS:  Please see the history of present illness. All other systems are reviewed and  Negative to the above problem except as noted.    PHYSICAL EXAM: VS:  There were no vitals taken for this visit.    GEN: Well nourished, well developed, in no acute distress   Examined in chair   HEENT: normal  Neck: no JVD Cardiac: RRR; no murmurs  No RLE edema  Respiratory:  clear to auscultation bilaterally GI: soft, nontender,  MS: s/p R transmet amputation   s/p L BKA  Skin: warm and dry, no rash Neuro:  Strength and sensation are intact Psych: euthymic mood, full affect   EKG:  EKG is ordered today.  SR 97  LAFB  Echo  10/18/21 Hypokinesis of the mid-apical anterolateral, apical inferolatera, apical anterior, and apical myocardium. Left ventricular ejection fraction, by estimation, is 45 to 50%. The left ventricle has mildly decreased function. The left ventricle demonstrates regional wall motion abnormalities (see scoring diagram/findings for description). Left ventricular diastolic parameters are consistent with Grade II diastolic dysfunction (pseudonormalization). 2. Right ventricular systolic function is normal. The right ventricular size is normal. The  mitral valve is normal in structure. No evidence of mitral valve regurgitation. No evidence of mitral stenosis. 3. The aortic valve is tricuspid. Aortic valve regurgitation is not visualized. No aortic stenosis is present. 4. The inferior vena cava is normal in size with greater than 50% respiratory variability, suggesting right atrial pressure of 3 mmHg.  Lipid Panel    Component Value Date/Time   CHOL 93 (L) 06/04/2020 0923   TRIG 427 (H) 10/09/2022 1350   HDL 27 (L) 10/09/2022 1350   CHOLHDL 4.4 06/04/2020 0923   CHOLHDL 6.8 11/11/2018 1304   VLDL 33 11/11/2018 1304   LDLCALC 30 06/04/2020 0923   LDLDIRECT 98 02/10/2012 1230    Prior cardiac studies  The following studies were reviewed today:   Echo  11/13/18 Study Conclusions   - Left ventricle: No mural apical thrombus with definity. septal   apical mid anterior and inferior hypokinesis preserved basal   function The cavity size was moderately dilated. Wall thickness   was increased in a pattern of mild LVH. Systolic function was   moderately to severely reduced. The estimated ejection fraction   was in the range of 30% to 35%. - Mitral valve: There was mild regurgitation. - Atrial septum: No defect or patent foramen ovale was identified.   LEFT HEART CATH AND CORONARY ANGIOGRAPHY  CORONARY/GRAFT ACUTE MI REVASCULARIZATION 11/11/18     Conclusion  Ost 2nd Mrg to 2nd Mrg lesion is 100% stenosed. Likely chronic occlusion with right to left collaterals. Mid LAD lesion is 99% stenosed. PTCA with 2.0 mm balloon performed. Post intervention, there is a 25% residual stenosis. Dist LM lesion is 25% stenosed. There is severe left ventricular systolic dysfunction. The left ventricular ejection fraction is 25-35% by visual estimate. LV end diastolic pressure is mildly elevated. There is no aortic valve stenosis.          Wt Readings from Last 3 Encounters:  10/09/22 186 lb (84.4 kg)  04/01/22 188 lb 3.2 oz (85.4 kg)   01/08/22 173 lb (78.5 kg)      ASSESSMENT AND PLAN:  1  CAD  Pt with severe dz noted above   No symptoms of angina   Keep on same regimen   2  HFmrEF   Volume status is OK      Follow   3  HL  Will get labs today   4  HTN  BP is elevated    Need to clarify what she is on  Contact pharmacy     5  DM   Will check A1C today   Current medicines are reviewed at length with the patient today.  The patient does not have concerns regarding medicines.  Signed, Dietrich Pates, MD  12/19/2023 5:18 PM    [facility] [address] [place], [place], [place]  [address] Phone: [phone]; Fax: [phone]

## 2023-12-21 ENCOUNTER — Ambulatory Visit: Payer: Medicare Other | Attending: Internal Medicine | Admitting: Internal Medicine

## 2024-02-09 ENCOUNTER — Other Ambulatory Visit: Payer: Self-pay | Admitting: Internal Medicine

## 2024-02-14 ENCOUNTER — Telehealth: Payer: Self-pay | Admitting: Internal Medicine

## 2024-02-14 NOTE — Telephone Encounter (Signed)
 Pt c/o medication issue:  1. Name of Medication:   furosemide (LASIX) 40 MG tablet  spironolactone (ALDACTONE) 25 MG tablet   2. How are you currently taking this medication (dosage and times per day)?   3. Are you having a reaction (difficulty breathing--STAT)?   4. What is your medication issue?   Caller Cala Bradford) was to clarify if patient should be on both of these medications.

## 2024-02-14 NOTE — Telephone Encounter (Signed)
  Spironolactone 12.5 mg Daily Patient not taking: Reported on 10/09/2022     We have not seen patient since 2023  It appears she went to [facility] cardiology on 11/17/24  Advised pharmacy to call them to clarify

## 2024-02-17 ENCOUNTER — Encounter: Payer: Self-pay | Admitting: Internal Medicine

## 2024-03-03 ENCOUNTER — Other Ambulatory Visit: Payer: Self-pay | Admitting: Internal Medicine

## 2024-03-28 ENCOUNTER — Ambulatory Visit: Admitting: Podiatry

## 2024-03-28 DIAGNOSIS — L97512 Non-pressure chronic ulcer of other part of right foot with fat layer exposed: Secondary | ICD-10-CM

## 2024-03-28 NOTE — Progress Notes (Signed)
 Physical exam:  General appearance: Pleasant, and in no acute distress. AOx3.  Vascular: Pedal pulses:  DP barely palpable right, PT nonpalpable left.  Moderate edema lower legs bilaterally. Capillary fill time ED at.  Neurological: Paresthesias foot right  Dermatologic:   10 x 10 ulceration pin returning into the subcutaneous tissue through the dermis distal aspect right foot.  It has a good base of granulation tissue with normal skin edges no signs of infection.  Moderate exudate  Musculoskeletal: Status post BKA amp left.  Status post transmetatarsal amputation right foot  Radiographs: None  Diagnosis: Seconds ulceration right foot penetrating through the dermis.  Plan: - sharply debrided any devitalized tissue from the ulcer down to a good bleeding base.  Triple antibiotic ointment and a gauze dressing. -Wound care: Soak foot twice daily for 10 minutes in Epsom salt warm lukewarm  salt water .  Apply Bactroban  ointment to wound and a gauze dressing.  Return 2 weeks

## 2024-04-18 NOTE — Progress Notes (Shared)
 [facility] - Clinic Note  04/19/2024   CHIEF COMPLAINT Patient presents for No chief complaint on file.  HISTORY OF PRESENT ILLNESS: Anne [name] is a 43 y.o. female who presents to the clinic today for:   Referring physician: Malka Sea, DO [address] [place],  [place] [address]  HISTORICAL INFORMATION:  Selected notes from the MEDICAL RECORD NUMBER Referred by Dr. Merlyn Starring:  Ocular Hx- PMH-   CURRENT MEDICATIONS: No current outpatient medications on file. (Ophthalmic Drugs)   No current facility-administered medications for this visit. (Ophthalmic Drugs)   Current Outpatient Medications (Other)  Medication Sig   acetaminophen  (TYLENOL ) 500 MG tablet Take 500 mg by mouth every 4 (four) hours as needed. (Patient not taking: Reported on 10/09/2022)   albuterol  (VENTOLIN  HFA) 108 (90 Base) MCG/ACT inhaler Inhale 2 puffs into the lungs every 6 (six) hours as needed for wheezing or shortness of breath. (Patient not taking: Reported on 10/09/2022)   aspirin  (GNP ASPIRIN  LOW DOSE) 81 MG EC tablet Take 1 tablet (81 mg total) by mouth daily with breakfast. Swallow whole.   atorvastatin  (LIPITOR ) 80 MG tablet Take 1 tablet (80 mg total) by mouth every evening.   BAQSIMI TWO PACK 3 MG/DOSE POWD Place 3 mg into the nose once as needed (for emergency low blood sugar levels).  (Patient not taking: Reported on 10/09/2022)   buPROPion (WELLBUTRIN XL) 150 MG 24 hr tablet Take 150 mg by mouth every morning.   BYDUREON  BCISE 2 MG/0.85ML AUIJ Inject 2 mg into the skin every Thursday. (Patient not taking: Reported on 10/09/2022)   carvedilol  (COREG ) 3.125 MG tablet Take 1 tablet (3.125 mg total) by mouth 2 (two) times daily. Please call [phone] to scheudle an overdue appointment for future refills. Thank you. 1st attempt.   Cholecalciferol (VITAMIN D) 125 MCG (5000 UT) CAPS Take 1 capsule by mouth daily in the afternoon.   cyanocobalamin (VITAMIN B12) 1000 MCG  tablet Take 1,000 mcg by mouth daily.   cyclobenzaprine  (FLEXERIL ) 10 MG tablet Take 1 tablet (10 mg total) by mouth 3 (three) times daily as needed for muscle spasms. (Patient not taking: Reported on 03/28/2024)   Ensure Max Protein (ENSURE MAX PROTEIN) LIQD Take 330 mLs (11 oz total) by mouth 2 (two) times daily.   ezetimibe  (ZETIA ) 10 MG tablet Take 1 tablet (10 mg total) by mouth daily. Please keep upcoming appointment for future refills. Thank you   fenofibrate  (TRICOR ) 145 MG tablet Take 1 tablet (145 mg total) by mouth daily.   ferrous sulfate  325 (65 FE) MG tablet Take 1 tablet (325 mg total) by mouth daily.   furosemide  (LASIX ) 40 MG tablet TAKE ONE TABLET BY MOUTH ONCE DAILY   HYDROcodone -acetaminophen  (NORCO/VICODIN) 5-325 MG tablet Take one tab po q 4 hrs prn pain (Patient not taking: Reported on 03/28/2024)   Insulin  Glargine (BASAGLAR  KWIKPEN [place]) Inject 41 Units into the skin at bedtime.   isosorbide  mononitrate (IMDUR ) 30 MG 24 hr tablet Take 1 tablet (30 mg total) by mouth daily. Please call [phone] to schedule an overdue appointment for future refills. Thank you. 2nd attempt.   lisinopril  (ZESTRIL ) 2.5 MG tablet Take 2.5 mg by mouth daily.   losartan  (COZAAR ) 25 MG tablet Take 1 tablet (25 mg total) by mouth every evening. (Patient not taking: Reported on 03/28/2024)   metFORMIN  (GLUCOPHAGE ) 1000 MG tablet Take 1,000 mg by mouth 2 (two) times daily with a meal.   metoCLOPramide  (REGLAN )  5 MG tablet Take 1 tablet (5 mg total) by mouth 3 (three) times daily before meals.   MOUNJARO 2.5 MG/0.5ML Pen Inject 2.5 mg into the skin once a week.   nitroGLYCERIN  (NITROSTAT ) 0.4 MG SL tablet Place 1 tablet (0.4 mg total) under the tongue every 5 (five) minutes as needed.   NOVOLOG  FLEXPEN 100 UNIT/ML FlexPen INJECT 12-18 UNITS S.Q. THREE TIMES DAILY WITH MEALS. (Patient taking differently: Inject 12-18 Units into the skin 3 (three) times daily with meals.)   NUZYRA 150 MG TABS Take 2 tablets by  mouth daily.   ondansetron  (ZOFRAN ) 4 MG tablet Take 1 tablet (4 mg total) by mouth every 8 (eight) hours as needed for nausea or vomiting. (Patient not taking: Reported on 03/28/2024)   pantoprazole  (PROTONIX ) 40 MG tablet Take 1 tablet (40 mg total) by mouth daily.   pregabalin  (LYRICA ) 75 MG capsule Take 1 capsule (75 mg total) by mouth daily.   promethazine  (PHENERGAN ) 25 MG tablet Take 25 mg by mouth 2 (two) times daily as needed. (Patient not taking: Reported on 03/28/2024)   SANTYL  250 UNIT/GM ointment Apply topically every other day.   sertraline  (ZOLOFT ) 100 MG tablet Take 200 mg by mouth at bedtime.    spironolactone  (ALDACTONE ) 25 MG tablet Take 12.5 mg by mouth daily. (Patient not taking: Reported on 10/09/2022)   ticagrelor  ([name] ) 60 MG TABS tablet Take 1 tablet (60 mg total) by mouth 2 (two) times daily. Please call [phone] to schedule an overdue appointment for future refills. Thank you. 2nd attempt.   ULTICARE MINI PEN NEEDLES 31G X 6 MM MISC See admin instructions.   Vitamin D, Ergocalciferol, (DRISDOL) 1.25 MG (50000 UNIT) CAPS capsule Take 50,000 Units by mouth every 7 (seven) days. (Patient not taking: Reported on 10/09/2022)   No current facility-administered medications for this visit. 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)    CAD (coronary artery disease) 11/13/2018   Late presentation anterior MI 12/19 >> LHC - dLM 25, mLAD 99, oOM2 100 (R-L collats), irreg RCA, EF 25-35 >> PCI: POBA to mLAD   Chronic systolic CHF (congestive heart failure) (HCC) 11/28/2018   Ischemic CM // late presentation ant MI 10/2018 tx with POBA to LAD (residual CAD with  CTO of the OM2) // Echo 12/19:  No mural apical thrombus, septal, apical mid ant and inf HK; mild LVH, EF 30-35, mild MR // Echo 01/2019: EF 30-35, Gr 1 DD, diff HK, apical AK, mild MR    CKD (chronic kidney disease), stage I    Diabetes mellitus type 1 (HCC)    Dyspnea    Former tobacco use    Gastroparesis    Hypercholesteremia    Hypertension    Myocardial infarction (HCC) 2019   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   Renal failure    Sciatica    Past Surgical History:  Procedure Laterality Date   AMPUTATION Right 11/03/2011   Procedure: AMPUTATION RAY;  Surgeon: Beau Bound;  Location: [facility];  Service: General;  Laterality: Right;  Right fourth and fifth  metatarsal    AMPUTATION Left 10/24/2021   Procedure: AMPUTATION BELOW KNEE;  Surgeon: Alanda Allegra, MD;  Location: [facility];  Service: General;  Laterality: Left;   APPENDECTOMY     CARDIAC CATHETERIZATION  10/2018   CESAREAN SECTION  2004 and 2007   x2   CORONARY/GRAFT ACUTE MI REVASCULARIZATION N/A 11/11/2018   Procedure: CORONARY/GRAFT ACUTE MI REVASCULARIZATION;  Surgeon: Lucendia Rusk, MD;  Location: [facility] INVASIVE CV LAB;  Service: Cardiovascular;  Laterality: N/A;   FRACTURE SURGERY  2000   INJECTION OF SILICONE OIL Right 09/14/2019   Procedure: Injection Of Silicone Oil;  Surgeon: Ronelle Coffee, MD;  Location: [facility] OR;  Service: Ophthalmology;  Laterality: Right;   IR RADIOLOGIST EVAL & MGMT  12/18/2021   LEFT HEART CATH AND CORONARY ANGIOGRAPHY N/A 11/11/2018   Procedure: LEFT HEART CATH AND CORONARY ANGIOGRAPHY;  Surgeon: Lucendia Rusk, MD;  Location: [facility] INVASIVE CV LAB;  Service: Cardiovascular;  Laterality: N/A;   MEMBRANE PEEL Right 09/14/2019   Procedure: Ludwig Safer;  Surgeon: Ronelle Coffee, MD;  Location: [facility] OR;  Service: Ophthalmology;  Laterality: Right;   PARS PLANA VITRECTOMY Right 09/14/2019   Procedure: Pars Plana Vitrectomy With 25 Gauge;  Surgeon: Ronelle Coffee, MD;  Location:  [facility] OR;  Service: Ophthalmology;  Laterality: Right;   PHOTOCOAGULATION WITH LASER Right 09/14/2019   Procedure: Photocoagulation With Laser;  Surgeon: Ronelle Coffee, MD;  Location: [facility] OR;  Service: Ophthalmology;  Laterality: Right;   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   Procedure: REPAIR OF COMPLEX TRACTION RETINAL DETACHMENT;  Surgeon: Ronelle Coffee, MD;  Location: [facility] OR;  Service: Ophthalmology;  Laterality: Right;   TUBAL LIGATION     FAMILY HISTORY Family History  Problem Relation Age of Onset   Diabetes Father    Lung cancer Father    Alcoholism Father    Asthma Mother    Kidney disease Mother    Anemia Mother        hemolytic   Hypertension Mother    Heart attack Paternal Grandmother    Diabetes Paternal Grandmother    Diabetes Paternal Grandfather    Anesthesia problems Neg Hx    Hypotension Neg Hx    Malignant hyperthermia Neg Hx    Pseudochol deficiency Neg Hx    SOCIAL HISTORY Social History   Tobacco Use   Smoking status: Former    Current packs/day: 0.00    Average packs/day: 0.3 packs/day for 20.0 years (5.0 ttl pk-yrs)    Types: Cigarettes    Start date: 12/29/1993    Quit date: 12/29/2013    Years since quitting: 10.3   Smokeless tobacco: Never  Vaping Use   Vaping status: Never Used  Substance Use Topics   Alcohol use: Not Currently   Drug use: Not Currently    Types: Marijuana       OPHTHALMIC EXAM:  Not recorded    IMAGING AND PROCEDURES  Imaging and Procedures for 04/19/2024        ASSESSMENT/PLAN: No diagnosis found. 1.  2.  3.  Ophthalmic Meds Ordered this visit:  No orders of the defined types were placed in this encounter.    No follow-ups on file.  There are no Patient Instructions on file for this visit.  Explained the diagnoses, plan, and follow up with the patient and they expressed understanding.  Patient expressed understanding of the importance of proper follow up care.   This document  serves as a record of services personally  performed by Jeanice Millard, MD, PhD. It was created on their behalf by Angelia Kelp, an ophthalmic technician. The creation of this record is the provider's dictation and/or activities during the visit.    Electronically signed by: Angelia Kelp, OA, 04/18/24  10:14 AM   Jeanice Millard, M.D., Ph.D. Diseases & Surgery of the Retina and Vitreous [facility] 04/19/2024  Abbreviations: M myopia (nearsighted); A astigmatism; H hyperopia (farsighted); P presbyopia; Mrx spectacle prescription;  CTL contact lenses; OD right eye; OS left eye; OU both eyes  XT exotropia; ET esotropia; PEK punctate epithelial keratitis; PEE punctate epithelial erosions; DES dry eye syndrome; MGD meibomian gland dysfunction; ATs artificial tears; PFAT's preservative free artificial tears; NSC nuclear sclerotic cataract; PSC posterior subcapsular cataract; ERM epi-retinal membrane; PVD posterior vitreous detachment; RD retinal detachment; DM diabetes mellitus; DR diabetic retinopathy; NPDR non-proliferative diabetic retinopathy; PDR proliferative diabetic retinopathy; CSME clinically significant macular edema; DME diabetic macular edema; dbh dot blot hemorrhages; CWS cotton wool spot; POAG primary open angle glaucoma; C/D cup-to-disc ratio; HVF humphrey visual field; GVF goldmann visual field; OCT optical coherence tomography; IOP intraocular pressure; BRVO Branch retinal vein occlusion; CRVO central retinal vein occlusion; CRAO central retinal artery occlusion; BRAO branch retinal artery occlusion; RT retinal tear; SB scleral buckle; PPV pars plana vitrectomy; VH Vitreous hemorrhage; PRP panretinal laser photocoagulation; IVK intravitreal kenalog ; VMT vitreomacular traction; MH Macular hole;  NVD neovascularization of the disc; NVE neovascularization elsewhere; AREDS age related eye disease study; ARMD age related macular degeneration; POAG primary open angle  glaucoma; EBMD epithelial/anterior basement membrane dystrophy; ACIOL anterior chamber intraocular lens; IOL intraocular lens; PCIOL posterior chamber intraocular lens; Phaco/IOL phacoemulsification with intraocular lens placement; PRK photorefractive keratectomy; LASIK laser assisted in situ keratomileusis; HTN hypertension; DM diabetes mellitus; COPD chronic obstructive pulmonary disease

## 2024-04-19 ENCOUNTER — Encounter (INDEPENDENT_AMBULATORY_CARE_PROVIDER_SITE_OTHER): Admitting: Ophthalmology

## 2024-04-19 DIAGNOSIS — H3581 Retinal edema: Secondary | ICD-10-CM

## 2024-04-25 ENCOUNTER — Ambulatory Visit: Admitting: Podiatry

## 2024-04-29 ENCOUNTER — Other Ambulatory Visit: Payer: Self-pay | Admitting: Internal Medicine

## 2024-05-01 ENCOUNTER — Ambulatory Visit (INDEPENDENT_AMBULATORY_CARE_PROVIDER_SITE_OTHER): Admitting: Podiatry

## 2024-05-01 DIAGNOSIS — L97512 Non-pressure chronic ulcer of other part of right foot with fat layer exposed: Secondary | ICD-10-CM

## 2024-05-01 NOTE — Progress Notes (Signed)
 Patient presents follow-up ulceration of the right foot.  Ulcer on the distal stump of the amputation site.  Has been soaking it daily twice daily and applying Bactroban  with light dressing to it  physical Exam:  Patient alert and oriented x 3.  No complaints of nausea, vomiting, fever, or chills  Vascular: DP pulses 1/4 bilateral. PT pulses 0/4 lateral.  Moderate edema. Capillary fill time immediate.  Dermatologic: Full-thickness deep ulceration penetrating the subcutaneous tissue at the amputation stump transmetatarsal right foot .  measures 5 mm wide x 5 mm long x 3 deep.  Clear drainage.  No erythema.  Moderate exudate.  No undermining.  Good base of granulation tissue.  Neurologic:   Musculoskeletal: Status post transmetatarsal amputation right  Radiographs: None today  Diagnoses: 1.  Full-thickness ulceration penetrating subcutaneous tissue right foot.  Plan: -Sharp debridement of full-thickness ulcer and subcutaneous tissue of right foot.  Debrided any devitalized tissue down to good bleeding base.  Applied triple antibiotic and a light dressing. - Continue wound care: Soak twice daily 15 minutes, apply Bactroban  ointment and a light dressing    Return 2 weeks  f/u ulcer

## 2024-05-09 NOTE — Progress Notes (Shared)
 [facility] - Clinic Note  05/17/2024     CHIEF COMPLAINT Patient presents for No chief complaint on file.   HISTORY OF PRESENT ILLNESS: Anne [name] is a 43 y.o. female who presents to the clinic today for:    pt states she was hospitalized in April for gastroparesis, she states she lost a bunch of weight at that time, she states she has had all her medications put into blister packs so that she makes sure she takes all of them, she says she was inadvertently missing some doses bc of her eye sight, she states she is feeling much better physically and has gained about 15lbs  Referring physician: Octavia Bruckner, MD [address] [place],  [place] [address]  HISTORICAL INFORMATION:   Selected notes from the MEDICAL RECORD NUMBER Referred by Dr. Sherwood Galla for concern of VH / TRD OS   CURRENT MEDICATIONS: No current outpatient medications on file. (Ophthalmic Drugs)   No current facility-administered medications for this visit. (Ophthalmic Drugs)   Current Outpatient Medications (Other)  Medication Sig   acetaminophen  (TYLENOL ) 500 MG tablet Take 500 mg by mouth every 4 (four) hours as needed.   albuterol  (VENTOLIN  HFA) 108 (90 Base) MCG/ACT inhaler Inhale 2 puffs into the lungs every 6 (six) hours as needed for wheezing or shortness of breath.   aspirin  (GNP ASPIRIN  LOW DOSE) 81 MG EC tablet Take 1 tablet (81 mg total) by mouth daily with breakfast. Swallow whole.   atorvastatin  (LIPITOR ) 80 MG tablet Take 1 tablet (80 mg total) by mouth every evening.   BAQSIMI TWO PACK 3 MG/DOSE POWD Place 3 mg into the nose once as needed (for emergency low blood sugar levels).   buPROPion (WELLBUTRIN XL) 150 MG 24 hr tablet Take 150 mg by mouth every morning.   BYDUREON  BCISE 2 MG/0.85ML AUIJ Inject 2 mg into the skin every Thursday.   carvedilol  (COREG ) 3.125 MG tablet Take 1 tablet (3.125 mg total) by mouth 2 (two) times daily. PT. MUST MAKE AN  APPOINTMENT IN ORDER TO RECEIVE FUTURE REFILLS. SECOND ATTEMPT.   Cholecalciferol (VITAMIN D) 125 MCG (5000 UT) CAPS Take 1 capsule by mouth daily in the afternoon.   cyanocobalamin (VITAMIN B12) 1000 MCG tablet Take 1,000 mcg by mouth daily.   cyclobenzaprine  (FLEXERIL ) 10 MG tablet Take 1 tablet (10 mg total) by mouth 3 (three) times daily as needed for muscle spasms.   Ensure Max Protein (ENSURE MAX PROTEIN) LIQD Take 330 mLs (11 oz total) by mouth 2 (two) times daily.   ezetimibe  (ZETIA ) 10 MG tablet Take 1 tablet (10 mg total) by mouth daily. Please keep upcoming appointment for future refills. Thank you   fenofibrate  (TRICOR ) 145 MG tablet Take 1 tablet (145 mg total) by mouth daily.   ferrous sulfate  325 (65 FE) MG tablet Take 1 tablet (325 mg total) by mouth daily.   furosemide  (LASIX ) 40 MG tablet TAKE ONE TABLET BY MOUTH ONCE DAILY   HYDROcodone -acetaminophen  (NORCO/VICODIN) 5-325 MG tablet Take one tab po q 4 hrs prn pain   Insulin  Glargine (BASAGLAR  KWIKPEN ) Inject 41 Units into the skin at bedtime.   isosorbide  mononitrate (IMDUR ) 30 MG 24 hr tablet Take 1 tablet (30 mg total) by mouth daily. Please call [phone] to schedule an overdue appointment for future refills. Thank you. 2nd attempt.   lisinopril  (ZESTRIL ) 2.5 MG tablet Take 2.5 mg by mouth daily.   losartan  (COZAAR ) 25 MG tablet  Take 1 tablet (25 mg total) by mouth every evening.   metFORMIN  (GLUCOPHAGE ) 1000 MG tablet Take 1,000 mg by mouth 2 (two) times daily with a meal.   metoCLOPramide  (REGLAN ) 5 MG tablet Take 1 tablet (5 mg total) by mouth 3 (three) times daily before meals.   MOUNJARO 2.5 MG/0.5ML Pen Inject 2.5 mg into the skin once a week.   nitroGLYCERIN  (NITROSTAT ) 0.4 MG SL tablet Place 1 tablet (0.4 mg total) under the tongue every 5 (five) minutes as needed.   NOVOLOG  FLEXPEN 100 UNIT/ML FlexPen INJECT 12-18 UNITS S.Q. THREE TIMES DAILY WITH MEALS. (Patient taking differently: Inject 12-18 Units into the  skin 3 (three) times daily with meals.)   NUZYRA 150 MG TABS Take 2 tablets by mouth daily.   ondansetron  (ZOFRAN ) 4 MG tablet Take 1 tablet (4 mg total) by mouth every 8 (eight) hours as needed for nausea or vomiting.   pantoprazole  (PROTONIX ) 40 MG tablet Take 1 tablet (40 mg total) by mouth daily.   pregabalin  (LYRICA ) 75 MG capsule Take 1 capsule (75 mg total) by mouth daily.   promethazine  (PHENERGAN ) 25 MG tablet Take 25 mg by mouth 2 (two) times daily as needed.   SANTYL  250 UNIT/GM ointment Apply topically every other day.   sertraline  (ZOLOFT ) 100 MG tablet Take 200 mg by mouth at bedtime.    spironolactone  (ALDACTONE ) 25 MG tablet Take 12.5 mg by mouth daily.   ticagrelor  ([name] ) 60 MG TABS tablet Take 1 tablet (60 mg total) by mouth 2 (two) times daily. Please call [phone] to schedule an overdue appointment for future refills. Thank you. 2nd attempt.   ULTICARE MINI PEN NEEDLES 31G X 6 MM MISC See admin instructions.   Vitamin D, Ergocalciferol, (DRISDOL) 1.25 MG (50000 UNIT) CAPS capsule Take 50,000 Units by mouth every 7 (seven) days.   No current facility-administered medications for this visit. 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)    CAD (coronary artery disease) 11/13/2018   Late presentation anterior MI 12/19 >> LHC - dLM 25, mLAD 99, oOM2 100 (R-L collats), irreg RCA, EF 25-35 >> PCI: POBA to mLAD   Chronic systolic CHF (congestive heart failure) (HCC) 11/28/2018   Ischemic CM // late presentation ant MI 10/2018 tx with POBA to LAD (residual CAD with CTO of the OM2) // Echo 12/19:  No mural apical thrombus, septal, apical mid ant and  inf HK; mild LVH, EF 30-35, mild MR // Echo 01/2019: EF 30-35, Gr 1 DD, diff HK, apical AK, mild MR    CKD (chronic kidney disease), stage I    Diabetes mellitus type 1 (HCC)    Dyspnea    Former tobacco use    Gastroparesis    Hypercholesteremia    Hypertension    Myocardial infarction (HCC) 2019   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   Renal failure    Sciatica    Past Surgical History:  Procedure Laterality Date   AMPUTATION Right 11/03/2011   Procedure: AMPUTATION RAY;  Surgeon: Oneil LABOR  Mavis;  Location: [facility];  Service: General;  Laterality: Right;  Right fourth and fifth metatarsal    AMPUTATION Left 10/24/2021   Procedure: AMPUTATION BELOW KNEE;  Surgeon: Mavis Anes, MD;  Location: [facility];  Service: General;  Laterality: Left;   APPENDECTOMY     CARDIAC CATHETERIZATION  10/2018   CESAREAN SECTION  2004 and 2007   x2   CORONARY/GRAFT ACUTE MI REVASCULARIZATION N/A 11/11/2018   Procedure: CORONARY/GRAFT ACUTE MI REVASCULARIZATION;  Surgeon: Dann Candyce RAMAN, MD;  Location: [facility] INVASIVE CV LAB;  Service: Cardiovascular;  Laterality: N/A;   FRACTURE SURGERY  2000   INJECTION OF SILICONE OIL Right 09/14/2019   Procedure: Injection Of Silicone Oil;  Surgeon: Valdemar Rogue, MD;  Location: [facility] OR;  Service: Ophthalmology;  Laterality: Right;   IR RADIOLOGIST EVAL & MGMT  12/18/2021   LEFT HEART CATH AND CORONARY ANGIOGRAPHY N/A 11/11/2018   Procedure: LEFT HEART CATH AND CORONARY ANGIOGRAPHY;  Surgeon: Dann Candyce RAMAN, MD;  Location: [facility] INVAS[facility] CV LAB;  Service: Cardiovascular;  Laterality: N/A;   MEMBRANE PEEL Right 09/14/2019   Procedure: Ottie Booty;  Surgeon: Valdemar Rogue, MD;  Location: [facility] OR;  Service: Ophthalmology;  Laterality: Right;   PARS PLANA VITRECTOMY Right 09/14/2019   Procedure: Pars Plana Vitrectomy With 25 Gauge;  Surgeon: Valdemar Rogue, MD;  Location: [facility] OR;  Service: Ophthalmology;  Laterality: Right;   PHOTOCOAGULATION WITH LASER  Right 09/14/2019   Procedure: Photocoagulation With Laser;  Surgeon: Valdemar Rogue, MD;  Location: [facility] OR;  Service: Ophthalmology;  Laterality: Right;   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   Procedure: REPAIR OF COMPLEX TRACTION RETINAL DETACHMENT;  Surgeon: Valdemar Rogue, MD;  Location: [facility] OR;  Service: Ophthalmology;  Laterality: Right;   TUBAL LIGATION      FAMILY HISTORY Family History  Problem Relation Age of Onset   Diabetes Father    Lung cancer Father    Alcoholism Father    Asthma Mother    Kidney disease Mother    Anemia Mother        hemolytic   Hypertension Mother    Heart attack Paternal Grandmother    Diabetes Paternal Grandmother    Diabetes Paternal Grandfather    Anesthesia problems Neg Hx    Hypotension Neg Hx    Malignant hyperthermia Neg Hx    Pseudochol deficiency Neg Hx     SOCIAL HISTORY Social History   Tobacco Use   Smoking status: Former    Current packs/day: 0.00    Average packs/day: 0.3 packs/day for 20.0 years (5.0 ttl pk-yrs)    Types: Cigarettes    Start date: 12/29/1993    Quit date: 12/29/2013    Years since quitting: 10.3   Smokeless tobacco: Never  Vaping Use   Vaping status: Never Used  Substance Use Topics   Alcohol use: Not Currently   Drug use: Not Currently    Types: Marijuana         OPHTHALMIC EXAM:  Not recorded     IMAGING AND PROCEDURES  Imaging and Procedures for @TODAY @            ASSESSMENT/PLAN:    ICD-10-CM   1. Retinal edema  H35.81     2. 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 Essential hypertension  I10     4. Hypertensive retinopathy of both eyes  H35.033     5. Combined forms of age-related cataract of both eyes  H25.813       1,2. 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 Hypertensive retinopathy OU  - discussed importance of tight BP control  - monitor  5. 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  - discussed diagnosis and progression  - OD w/ progressive PSC -- likely limiting vision  - will refer to [facility] for cat eval   Ophthalmic Meds Ordered this visit:  No orders of the defined types were placed in this encounter.      No follow-ups on file.  There are no Patient Instructions on file for this visit.   Explained the diagnoses, plan, and follow up with the patient and they expressed understanding.  Patient expressed understanding of the importance of proper follow up care.   This document serves as a record of services personally performed by Redell JUDITHANN Hans, MD, PhD. It was created on their behalf by Alan PARAS. Delores, OA an  ophthalmic technician. The creation of this record is the provider's dictation and/or activities during the visit.    Electronically signed by: Alan PARAS. Delores, OA 05/16/24 4:18 PM   Redell JUDITHANN Hans, M.D., Ph.D. Diseases & Surgery of the Retina and Vitreous [facility]    Abbreviations: M myopia (nearsighted); A astigmatism; H hyperopia (farsighted); P presbyopia; Mrx spectacle prescription;  CTL contact lenses; OD right eye; OS left eye; OU both eyes  XT exotropia; ET esotropia; PEK punctate epithelial keratitis; PEE punctate epithelial erosions; DES dry eye syndrome; MGD meibomian gland dysfunction; ATs artificial tears; PFAT's preservative free artificial tears; NSC nuclear sclerotic cataract; PSC posterior subcapsular cataract; ERM epi-retinal membrane; PVD posterior vitreous detachment; RD retinal detachment; DM diabetes mellitus; DR diabetic retinopathy; NPDR non-proliferative diabetic retinopathy; PDR proliferative diabetic retinopathy; CSME clinically significant macular edema; DME diabetic macular edema; dbh dot blot hemorrhages; CWS cotton wool spot; POAG primary open angle glaucoma; C/D cup-to-disc ratio; HVF humphrey visual field; GVF goldmann visual field; OCT  optical coherence tomography; IOP intraocular pressure; BRVO Branch retinal vein occlusion; CRVO central retinal vein occlusion; CRAO central retinal artery occlusion; BRAO branch retinal artery occlusion; RT retinal tear; SB scleral buckle; PPV pars plana vitrectomy; VH Vitreous hemorrhage; PRP panretinal laser photocoagulation; IVK intravitreal kenalog ; VMT vitreomacular traction; MH Macular hole;  NVD neovascularization of the disc; NVE neovascularization elsewhere; AREDS age related eye disease study; ARMD age related macular degeneration; POAG primary open angle glaucoma; EBMD epithelial/anterior basement membrane dystrophy; ACIOL anterior chamber intraocular lens; IOL intraocular lens; PCIOL posterior  chamber intraocular lens; Phaco/IOL phacoemulsification with intraocular lens placement; PRK photorefractive keratectomy; LASIK laser assisted in situ keratomileusis; HTN hypertension; DM diabetes mellitus; COPD chronic obstructive pulmonary disease

## 2024-05-15 ENCOUNTER — Ambulatory Visit: Admitting: Podiatry

## 2024-05-17 ENCOUNTER — Encounter (INDEPENDENT_AMBULATORY_CARE_PROVIDER_SITE_OTHER): Payer: Self-pay

## 2024-05-17 ENCOUNTER — Encounter (INDEPENDENT_AMBULATORY_CARE_PROVIDER_SITE_OTHER): Admitting: Ophthalmology

## 2024-05-17 DIAGNOSIS — H25813 Combined forms of age-related cataract, bilateral: Secondary | ICD-10-CM

## 2024-05-17 DIAGNOSIS — H35033 Hypertensive retinopathy, bilateral: Secondary | ICD-10-CM

## 2024-05-17 DIAGNOSIS — I10 Essential (primary) hypertension: Secondary | ICD-10-CM

## 2024-05-17 DIAGNOSIS — E113523 Type 2 diabetes mellitus with proliferative diabetic retinopathy with traction retinal detachment involving the macula, bilateral: Secondary | ICD-10-CM

## 2024-05-17 DIAGNOSIS — H3581 Retinal edema: Secondary | ICD-10-CM

## 2024-05-22 NOTE — Progress Notes (Shared)
 [facility] - Clinic Note  05/31/2024     CHIEF COMPLAINT Patient presents for No chief complaint on file.   HISTORY OF PRESENT ILLNESS: Anne [name] is a 42 y.o. female who presents to the clinic today for:    pt states   Referring physician: Halbert Mariano SQUIBB, DO [address] [place],  [place] [address]  HISTORICAL INFORMATION:   Selected notes from the MEDICAL RECORD NUMBER Referred by Dr. Sherwood Galla for concern of VH / TRD OS   CURRENT MEDICATIONS: No current outpatient medications on file. (Ophthalmic Drugs)   No current facility-administered medications for this visit. (Ophthalmic Drugs)   Current Outpatient Medications (Other)  Medication Sig   acetaminophen  (TYLENOL ) 500 MG tablet Take 500 mg by mouth every 4 (four) hours as needed.   albuterol  (VENTOLIN  HFA) 108 (90 Base) MCG/ACT inhaler Inhale 2 puffs into the lungs every 6 (six) hours as needed for wheezing or shortness of breath.   aspirin  (GNP ASPIRIN  LOW DOSE) 81 MG EC tablet Take 1 tablet (81 mg total) by mouth daily with breakfast. Swallow whole.   atorvastatin  (LIPITOR ) 80 MG tablet Take 1 tablet (80 mg total) by mouth every evening.   BAQSIMI TWO PACK 3 MG/DOSE POWD Place 3 mg into the nose once as needed (for emergency low blood sugar levels).   buPROPion (WELLBUTRIN XL) 150 MG 24 hr tablet Take 150 mg by mouth every morning.   BYDUREON  BCISE 2 MG/0.85ML AUIJ Inject 2 mg into the skin every Thursday.   carvedilol  (COREG ) 3.125 MG tablet Take 1 tablet (3.125 mg total) by mouth 2 (two) times daily. PT. MUST MAKE AN APPOINTMENT IN ORDER TO RECEIVE FUTURE REFILLS. SECOND ATTEMPT.   Cholecalciferol (VITAMIN D) 125 MCG (5000 UT) CAPS Take 1 capsule by mouth daily in the afternoon.   cyanocobalamin (VITAMIN B12) 1000 MCG tablet Take 1,000 mcg by mouth daily.   cyclobenzaprine  (FLEXERIL ) 10 MG tablet Take 1 tablet (10 mg total) by mouth 3 (three) times daily as needed for muscle  spasms.   Ensure Max Protein (ENSURE MAX PROTEIN) LIQD Take 330 mLs (11 oz total) by mouth 2 (two) times daily.   ezetimibe  (ZETIA ) 10 MG tablet Take 1 tablet (10 mg total) by mouth daily. Please keep upcoming appointment for future refills. Thank you   fenofibrate  (TRICOR ) 145 MG tablet Take 1 tablet (145 mg total) by mouth daily.   ferrous sulfate  325 (65 FE) MG tablet Take 1 tablet (325 mg total) by mouth daily.   furosemide  (LASIX ) 40 MG tablet TAKE ONE TABLET BY MOUTH ONCE DAILY   HYDROcodone -acetaminophen  (NORCO/VICODIN) 5-325 MG tablet Take one tab po q 4 hrs prn pain   Insulin  Glargine (BASAGLAR  KWIKPEN [place]) Inject 41 Units into the skin at bedtime.   isosorbide  mononitrate (IMDUR ) 30 MG 24 hr tablet Take 1 tablet (30 mg total) by mouth daily. Please call [phone] to schedule an overdue appointment for future refills. Thank you. 2nd attempt.   lisinopril  (ZESTRIL ) 2.5 MG tablet Take 2.5 mg by mouth daily.   losartan  (COZAAR ) 25 MG tablet Take 1 tablet (25 mg total) by mouth every evening.   metFORMIN  (GLUCOPHAGE ) 1000 MG tablet Take 1,000 mg by mouth 2 (two) times daily with a meal.   metoCLOPramide  (REGLAN ) 5 MG tablet Take 1 tablet (5 mg total) by mouth 3 (three) times daily before meals.   MOUNJARO 2.5 MG/0.5ML Pen Inject 2.5 mg into the skin once a week.  nitroGLYCERIN  (NITROSTAT ) 0.4 MG SL tablet Place 1 tablet (0.4 mg total) under the tongue every 5 (five) minutes as needed.   NOVOLOG  FLEXPEN 100 UNIT/ML FlexPen INJECT 12-18 UNITS S.Q. THREE TIMES DAILY WITH MEALS. (Patient taking differently: Inject 12-18 Units into the skin 3 (three) times daily with meals.)   NUZYRA 150 MG TABS Take 2 tablets by mouth daily.   ondansetron  (ZOFRAN ) 4 MG tablet Take 1 tablet (4 mg total) by mouth every 8 (eight) hours as needed for nausea or vomiting.   pantoprazole  (PROTONIX ) 40 MG tablet Take 1 tablet (40 mg total) by mouth daily.   pregabalin  (LYRICA ) 75 MG capsule Take 1 capsule (75 mg  total) by mouth daily.   promethazine  (PHENERGAN ) 25 MG tablet Take 25 mg by mouth 2 (two) times daily as needed.   SANTYL  250 UNIT/GM ointment Apply topically every other day.   sertraline  (ZOLOFT ) 100 MG tablet Take 200 mg by mouth at bedtime.    spironolactone  (ALDACTONE ) 25 MG tablet Take 12.5 mg by mouth daily.   ticagrelor  ([name] ) 60 MG TABS tablet Take 1 tablet (60 mg total) by mouth 2 (two) times daily. Please call [phone] to schedule an overdue appointment for future refills. Thank you. 2nd attempt.   ULTICARE MINI PEN NEEDLES 31G X 6 MM MISC See admin instructions.   Vitamin D, Ergocalciferol, (DRISDOL) 1.25 MG (50000 UNIT) CAPS capsule Take 50,000 Units by mouth every 7 (seven) days.   No current facility-administered medications for this visit. 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)    CAD (coronary artery disease) 11/13/2018   Late presentation anterior MI 12/19 >> LHC - dLM 25, mLAD 99, oOM2 100 (R-L collats), irreg RCA, EF 25-35 >> PCI: POBA to mLAD   Chronic systolic CHF (congestive heart failure) (HCC) 11/28/2018   Ischemic CM // late presentation ant MI 10/2018 tx with POBA to LAD (residual CAD with CTO of the OM2) // Echo 12/19:  No mural apical thrombus, septal, apical mid ant and inf HK; mild LVH, EF 30-35, mild MR // Echo 01/2019: EF 30-35, Gr 1 DD, diff HK, apical AK, mild MR    CKD (chronic kidney disease), stage I    Diabetes mellitus type 1 (HCC)    Dyspnea    Former tobacco use    Gastroparesis    Hypercholesteremia    Hypertension    Myocardial infarction (HCC) 2019   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    Renal failure    Sciatica    Past Surgical History:  Procedure Laterality Date   AMPUTATION Right 11/03/2011   Procedure: AMPUTATION RAY;  Surgeon: Oneil DELENA Budge;  Location: [facility];  Service: General;  Laterality: Right;  Right fourth and fifth metatarsal    AMPUTATION Left 10/24/2021   Procedure: AMPUTATION BELOW KNEE;  Surgeon: Budge Oneil, MD;  Location: [facility];  Service: General;  Laterality: Left;   APPENDECTOMY     CARDIAC CATHETERIZATION  10/2018   CESAREAN SECTION  2004 and 2007   x2  CORONARY/GRAFT ACUTE MI REVASCULARIZATION N/A 11/11/2018   Procedure: CORONARY/GRAFT ACUTE MI REVASCULARIZATION;  Surgeon: Dann Candyce RAMAN, MD;  Location: [facility] INVASIVE CV LAB;  Service: Cardiovascular;  Laterality: N/A;   FRACTURE SURGERY  2000   INJECTION OF SILICONE OIL Right 09/14/2019   Procedure: Injection Of Silicone Oil;  Surgeon: Valdemar Rogue, MD;  Location[facility] OR;  Service: Ophthalmology;  Laterality: Right;   IR RADIOLOGIST EVAL & MGMT  12/18/2021   LEFT HEART CATH AND CORONARY ANGIOGRAPHY N/A 11/11/2018   Procedure: LEFT HEART CATH AND CORONARY ANGIOGRAPHY;  Surgeon: Dann Candyce RAMAN, MD;  Location: [facility] INVASIVE CV LAB;  Service: Cardiovascular;  Laterality: N/A;   MEMBRANE PEEL Right 09/14/2019   Procedure: Ottie Booty;  Surgeon: Valdemar Rogue, MD;  Location[facility] OR;  Service: Ophthalmology;  Laterality: Right;   PARS PLANA VITRECTOMY Right 09/14/2019   Procedure: Pars Plana Vitrectomy With 25 Gauge;  Surgeon: Valdemar Rogue, MD;  Location: [facility] OR;  Service: Ophthalmology;  Laterality: Right;   PHOTOCOAGULATION WITH LASER Right 09/14/2019   Procedure: Photocoagulation With Laser;  Surgeon: Valdemar Rogue, MD;  Location: [facility] OR;  Service: Ophthalmology;  Laterality: Right;   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   Procedure: REPAIR OF COMPLEX TRACTION RETINAL DETACHMENT;  Surgeon: Valdemar Rogue, MD;  Location: [facility] OR;  Service: Ophthalmology;  Laterality:  Right;   TUBAL LIGATION      FAMILY HISTORY Family History  Problem Relation Age of Onset   Diabetes Father    Lung cancer Father    Alcoholism Father    Asthma Mother    Kidney disease Mother    Anemia Mother        hemolytic   Hypertension Mother    Heart attack Paternal Grandmother    Diabetes Paternal Grandmother    Diabetes Paternal Grandfather    Anesthesia problems Neg Hx    Hypotension Neg Hx    Malignant hyperthermia Neg Hx    Pseudochol deficiency Neg Hx     SOCIAL HISTORY Social History   Tobacco Use   Smoking status: Former    Current packs/day: 0.00    Average packs/day: 0.3 packs/day for 20.0 years (5.0 ttl pk-yrs)    Types: Cigarettes    Start date: 12/29/1993    Quit date: 12/29/2013    Years since quitting: 10.4   Smokeless tobacco: Never  Vaping Use   Vaping status: Never Used  Substance Use Topics   Alcohol use: Not Currently   Drug use: Not Currently    Types: Marijuana         OPHTHALMIC EXAM:   Not recorded     IMAGING AND PROCEDURES  Imaging and Procedures for @TODAY @            ASSESSMENT/PLAN:  No diagnosis found.   1,2. 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)  - delayed follow up from 4 weeks to 11 weeks (from 12.11.20 to 3.2.21)  - formerly managed at [facility] -- s/p PRP OU -- last visit in 2017  - extensive history of severe disease and medical noncompliance  - exam showed and OCT confirmed TRD OU -- OD with inf macula and foveal involvement; OS with closed wolf-jaw total TRD with macular hole  - discussed findings and very poor prognosis given chronicity of problems  - BCVA stable at 20/150 OD   - S/P IVA #1 OD (10.16.20)  - s/p PPV/MP/EL/FAX/14% C3F8 OD, 10.22.20             -  fibrosis improved and retina flattening under oil  - OCT shows persistent shallow SRF inferiorly -- improving slowly             - IOP okay today at 20, was elevated at 56 last visit on 03.02.21              - completed PF taper    - continue Cosopt  TID OD  - continue Brimonidine  TID OD  - continue PSO ung PRN             - avoid laying flat on back  - f/u 4-6 weeks, DFE, OCT   3,4. Hypertensive retinopathy OU  - discussed importance of tight BP control  - monitor  5. 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  - discussed diagnosis and progression  - not yet visually significant  - monitor for now   Ophthalmic Meds Ordered this visit:  No orders of the defined types were placed in this encounter.      No follow-ups on file.  There are no Patient Instructions on file for this visit.   Explained the diagnoses, plan, and follow up with the patient and they expressed understanding.  Patient expressed understanding of the importance of proper follow up care.   This document serves as a record of services personally performed by Redell JUDITHANN Hans, MD, PhD. It was created on their behalf by Almetta Pesa, an ophthalmic technician. The creation of this record is the provider's dictation and/or activities during the visit.    Electronically signed by: Almetta Pesa, OA, 05/22/24  9:23 AM   Redell JUDITHANN Hans, M.D., Ph.D. Diseases & Surgery of the Retina and Vitreous [facility]    Abbreviations: M myopia (nearsighted); A astigmatism; H hyperopia (farsighted); P presbyopia; Mrx spectacle prescription;  CTL contact lenses; OD right eye; OS left eye; OU both eyes  XT exotropia; ET esotropia; PEK punctate epithelial keratitis; PEE punctate epithelial erosions; DES dry eye syndrome; MGD meibomian gland dysfunction; ATs artificial tears; PFAT's preservative free artificial tears; NSC nuclear sclerotic cataract; PSC posterior subcapsular cataract; ERM epi-retinal membrane; PVD posterior vitreous detachment; RD retinal detachment; DM diabetes mellitus; DR diabetic retinopathy;  NPDR non-proliferative diabetic retinopathy; PDR proliferative diabetic retinopathy; CSME clinically significant macular edema; DME diabetic macular edema; dbh dot blot hemorrhages; CWS cotton wool spot; POAG primary open angle glaucoma; C/D cup-to-disc ratio; HVF humphrey visual field; GVF goldmann visual field; OCT optical coherence tomography; IOP intraocular pressure; BRVO Branch retinal vein occlusion; CRVO central retinal vein occlusion; CRAO central retinal artery occlusion; BRAO branch retinal artery occlusion; RT retinal tear; SB scleral buckle; PPV pars plana vitrectomy; VH Vitreous hemorrhage; PRP panretinal laser photocoagulation; IVK intravitreal kenalog ; VMT vitreomacular traction; MH Macular hole;  NVD neovascularization of the disc; NVE neovascularization elsewhere; AREDS age related eye disease study; ARMD age related macular degeneration; POAG primary open angle glaucoma; EBMD epithelial/anterior basement membrane dystrophy; ACIOL anterior chamber intraocular lens; IOL intraocular lens; PCIOL posterior chamber intraocular lens; Phaco/IOL phacoemulsification with intraocular lens placement; PRK photorefractive keratectomy; LASIK laser assisted in situ keratomileusis; HTN hypertension; DM diabetes mellitus; COPD chronic obstructive pulmonary disease

## 2024-05-31 ENCOUNTER — Encounter (INDEPENDENT_AMBULATORY_CARE_PROVIDER_SITE_OTHER): Payer: Self-pay

## 2024-05-31 ENCOUNTER — Encounter (INDEPENDENT_AMBULATORY_CARE_PROVIDER_SITE_OTHER): Admitting: Ophthalmology

## 2024-05-31 DIAGNOSIS — I10 Essential (primary) hypertension: Secondary | ICD-10-CM

## 2024-05-31 DIAGNOSIS — H35033 Hypertensive retinopathy, bilateral: Secondary | ICD-10-CM

## 2024-05-31 DIAGNOSIS — H3343 Traction detachment of retina, bilateral: Secondary | ICD-10-CM

## 2024-05-31 DIAGNOSIS — H25013 Cortical age-related cataract, bilateral: Secondary | ICD-10-CM

## 2024-05-31 DIAGNOSIS — E113513 Type 2 diabetes mellitus with proliferative diabetic retinopathy with macular edema, bilateral: Secondary | ICD-10-CM

## 2024-06-15 ENCOUNTER — Other Ambulatory Visit: Payer: Self-pay | Admitting: Internal Medicine

## 2024-07-07 ENCOUNTER — Other Ambulatory Visit: Payer: Self-pay | Admitting: Internal Medicine

## 2024-07-29 ENCOUNTER — Other Ambulatory Visit: Payer: Self-pay | Admitting: Internal Medicine
